# Patient Record
Sex: Male | Born: 1935
Health system: Southern US, Community
[De-identification: ages and names within clinical notes are randomized; demographics above are authoritative.]

## PROBLEM LIST (undated history)

## (undated) DIAGNOSIS — Z923 Personal history of irradiation: Secondary | ICD-10-CM

## (undated) DIAGNOSIS — L02419 Cutaneous abscess of limb, unspecified: Secondary | ICD-10-CM

## (undated) DIAGNOSIS — R002 Palpitations: Secondary | ICD-10-CM

## (undated) DIAGNOSIS — K210 Gastro-esophageal reflux disease with esophagitis: Secondary | ICD-10-CM

## (undated) DIAGNOSIS — M159 Polyosteoarthritis, unspecified: Secondary | ICD-10-CM

## (undated) DIAGNOSIS — H0230 Blepharochalasis unspecified eye, unspecified eyelid: Secondary | ICD-10-CM

## (undated) DIAGNOSIS — I440 Atrioventricular block, first degree: Secondary | ICD-10-CM

## (undated) DIAGNOSIS — C61 Malignant neoplasm of prostate: Secondary | ICD-10-CM

## (undated) DIAGNOSIS — J209 Acute bronchitis, unspecified: Secondary | ICD-10-CM

## (undated) DIAGNOSIS — E785 Hyperlipidemia, unspecified: Secondary | ICD-10-CM

## (undated) DIAGNOSIS — R079 Chest pain, unspecified: Secondary | ICD-10-CM

## (undated) DIAGNOSIS — S2249XA Multiple fractures of ribs, unspecified side, initial encounter for closed fracture: Secondary | ICD-10-CM

## (undated) DIAGNOSIS — E1165 Type 2 diabetes mellitus with hyperglycemia: Secondary | ICD-10-CM

## (undated) DIAGNOSIS — C7951 Secondary malignant neoplasm of bone: Secondary | ICD-10-CM

## (undated) DIAGNOSIS — H269 Unspecified cataract: Secondary | ICD-10-CM

## (undated) DIAGNOSIS — R310 Gross hematuria: Secondary | ICD-10-CM

## (undated) DIAGNOSIS — R7989 Other specified abnormal findings of blood chemistry: Secondary | ICD-10-CM

## (undated) DIAGNOSIS — M4800 Spinal stenosis, site unspecified: Secondary | ICD-10-CM

## (undated) DIAGNOSIS — L03119 Cellulitis of unspecified part of limb: Secondary | ICD-10-CM

## (undated) DIAGNOSIS — IMO0001 Reserved for inherently not codable concepts without codable children: Secondary | ICD-10-CM

## (undated) DIAGNOSIS — Z0000 Encounter for general adult medical examination without abnormal findings: Secondary | ICD-10-CM

## (undated) DIAGNOSIS — I10 Essential (primary) hypertension: Secondary | ICD-10-CM

## (undated) HISTORY — DX: Gross hematuria: R31.0

## (undated) HISTORY — PX: OTHER SURGICAL HISTORY: SHX169

## (undated) HISTORY — DX: Cellulitis of unspecified part of limb: L03.119

## (undated) HISTORY — DX: Hyperlipidemia, unspecified: E78.5

## (undated) HISTORY — DX: Gastro-esophageal reflux disease with esophagitis: K21.0

## (undated) HISTORY — DX: Atrioventricular block, first degree: I44.0

## (undated) HISTORY — DX: Other specified abnormal findings of blood chemistry: R79.89

## (undated) HISTORY — DX: Personal history of irradiation: Z92.3

## (undated) HISTORY — DX: Spinal stenosis, site unspecified: M48.00

## (undated) HISTORY — DX: Reserved for inherently not codable concepts without codable children: IMO0001

## (undated) HISTORY — DX: Acute bronchitis, unspecified: J20.9

## (undated) HISTORY — DX: Chest pain, unspecified: R07.9

## (undated) HISTORY — DX: Blepharochalasis unspecified eye, unspecified eyelid: H02.30

## (undated) HISTORY — DX: Palpitations: R00.2

## (undated) HISTORY — DX: Multiple fractures of ribs, unspecified side, initial encounter for closed fracture: S22.49XA

## (undated) HISTORY — DX: Cutaneous abscess of limb, unspecified: L02.419

## (undated) HISTORY — DX: Type 2 diabetes mellitus with hyperglycemia: E11.65

## (undated) HISTORY — DX: Encounter for general adult medical examination without abnormal findings: Z00.00

## (undated) HISTORY — DX: Unspecified cataract: H26.9

## (undated) HISTORY — DX: Polyosteoarthritis, unspecified: M15.9

---

## 1991-04-29 DIAGNOSIS — R7989 Other specified abnormal findings of blood chemistry: Secondary | ICD-10-CM

## 1991-04-29 HISTORY — DX: Other specified abnormal findings of blood chemistry: R79.89

## 1994-11-15 HISTORY — PX: PROSTATECTOMY: SHX69

## 1994-12-16 DIAGNOSIS — C61 Malignant neoplasm of prostate: Secondary | ICD-10-CM

## 1994-12-16 HISTORY — DX: Malignant neoplasm of prostate: C61

## 1995-04-29 DIAGNOSIS — M4800 Spinal stenosis, site unspecified: Secondary | ICD-10-CM

## 1995-04-29 HISTORY — DX: Spinal stenosis, site unspecified: M48.00

## 1998-03-20 ENCOUNTER — Ambulatory Visit (HOSPITAL_COMMUNITY): Admission: RE | Admit: 1998-03-20 | Discharge: 1998-03-20 | Payer: Self-pay | Admitting: Urology

## 1998-05-01 ENCOUNTER — Ambulatory Visit (HOSPITAL_COMMUNITY): Admission: RE | Admit: 1998-05-01 | Discharge: 1998-05-01 | Payer: Self-pay | Admitting: Urology

## 1998-09-07 ENCOUNTER — Encounter: Admission: RE | Admit: 1998-09-07 | Discharge: 1998-12-06 | Payer: Self-pay | Admitting: Radiation Oncology

## 1998-11-15 DIAGNOSIS — Z923 Personal history of irradiation: Secondary | ICD-10-CM

## 1998-11-15 HISTORY — DX: Personal history of irradiation: Z92.3

## 1998-12-08 ENCOUNTER — Encounter: Admission: RE | Admit: 1998-12-08 | Discharge: 1999-03-08 | Payer: Self-pay | Admitting: Radiation Oncology

## 2004-03-23 ENCOUNTER — Emergency Department (HOSPITAL_COMMUNITY): Admission: EM | Admit: 2004-03-23 | Discharge: 2004-03-24 | Payer: Self-pay | Admitting: Emergency Medicine

## 2004-03-24 DIAGNOSIS — S2249XA Multiple fractures of ribs, unspecified side, initial encounter for closed fracture: Secondary | ICD-10-CM

## 2004-03-24 HISTORY — DX: Multiple fractures of ribs, unspecified side, initial encounter for closed fracture: S22.49XA

## 2004-04-28 DIAGNOSIS — R002 Palpitations: Secondary | ICD-10-CM

## 2004-04-28 DIAGNOSIS — R079 Chest pain, unspecified: Secondary | ICD-10-CM

## 2004-04-28 HISTORY — DX: Chest pain, unspecified: R07.9

## 2004-04-28 HISTORY — DX: Palpitations: R00.2

## 2004-11-15 HISTORY — PX: EYE SURGERY: SHX253

## 2006-05-03 ENCOUNTER — Ambulatory Visit (HOSPITAL_COMMUNITY): Admission: RE | Admit: 2006-05-03 | Discharge: 2006-05-03 | Payer: Self-pay | Admitting: Ophthalmology

## 2006-10-28 DIAGNOSIS — H0230 Blepharochalasis unspecified eye, unspecified eyelid: Secondary | ICD-10-CM

## 2006-10-28 HISTORY — DX: Blepharochalasis unspecified eye, unspecified eyelid: H02.30

## 2007-04-25 DIAGNOSIS — I440 Atrioventricular block, first degree: Secondary | ICD-10-CM

## 2007-04-25 HISTORY — DX: Atrioventricular block, first degree: I44.0

## 2008-05-10 DIAGNOSIS — K2100 Gastro-esophageal reflux disease with esophagitis, without bleeding: Secondary | ICD-10-CM

## 2008-05-10 HISTORY — DX: Gastro-esophageal reflux disease with esophagitis, without bleeding: K21.00

## 2010-10-19 ENCOUNTER — Encounter: Admission: RE | Admit: 2010-10-19 | Discharge: 2010-10-19 | Payer: Self-pay | Admitting: Internal Medicine

## 2010-10-22 DIAGNOSIS — M159 Polyosteoarthritis, unspecified: Secondary | ICD-10-CM

## 2010-10-22 HISTORY — DX: Polyosteoarthritis, unspecified: M15.9

## 2011-09-06 DIAGNOSIS — R310 Gross hematuria: Secondary | ICD-10-CM

## 2011-09-06 HISTORY — DX: Gross hematuria: R31.0

## 2011-12-16 DIAGNOSIS — C61 Malignant neoplasm of prostate: Secondary | ICD-10-CM | POA: Diagnosis not present

## 2011-12-16 DIAGNOSIS — Z125 Encounter for screening for malignant neoplasm of prostate: Secondary | ICD-10-CM | POA: Diagnosis not present

## 2012-01-17 DIAGNOSIS — L02419 Cutaneous abscess of limb, unspecified: Secondary | ICD-10-CM

## 2012-01-17 HISTORY — DX: Cellulitis of unspecified part of limb: L02.419

## 2012-01-24 DIAGNOSIS — L03119 Cellulitis of unspecified part of limb: Secondary | ICD-10-CM | POA: Diagnosis not present

## 2012-02-17 DIAGNOSIS — I10 Essential (primary) hypertension: Secondary | ICD-10-CM | POA: Diagnosis not present

## 2012-02-17 DIAGNOSIS — M159 Polyosteoarthritis, unspecified: Secondary | ICD-10-CM | POA: Diagnosis not present

## 2012-02-17 DIAGNOSIS — E785 Hyperlipidemia, unspecified: Secondary | ICD-10-CM | POA: Diagnosis not present

## 2012-02-21 DIAGNOSIS — E785 Hyperlipidemia, unspecified: Secondary | ICD-10-CM | POA: Diagnosis not present

## 2012-02-21 DIAGNOSIS — I10 Essential (primary) hypertension: Secondary | ICD-10-CM | POA: Diagnosis not present

## 2012-06-19 DIAGNOSIS — N4889 Other specified disorders of penis: Secondary | ICD-10-CM | POA: Diagnosis not present

## 2012-06-19 DIAGNOSIS — R972 Elevated prostate specific antigen [PSA]: Secondary | ICD-10-CM | POA: Diagnosis not present

## 2012-06-19 DIAGNOSIS — C61 Malignant neoplasm of prostate: Secondary | ICD-10-CM | POA: Diagnosis not present

## 2012-06-19 DIAGNOSIS — R310 Gross hematuria: Secondary | ICD-10-CM | POA: Diagnosis not present

## 2012-06-22 ENCOUNTER — Ambulatory Visit
Admission: RE | Admit: 2012-06-22 | Discharge: 2012-06-22 | Disposition: A | Discharge status: Home / Self Care | Payer: Medicare Other | Source: Ambulatory Visit | Attending: Urology | Admitting: Urology

## 2012-06-22 ENCOUNTER — Other Ambulatory Visit: Payer: Self-pay | Admitting: Urology

## 2012-06-22 DIAGNOSIS — R319 Hematuria, unspecified: Secondary | ICD-10-CM | POA: Diagnosis not present

## 2012-06-22 DIAGNOSIS — K5730 Diverticulosis of large intestine without perforation or abscess without bleeding: Secondary | ICD-10-CM | POA: Diagnosis not present

## 2012-06-22 DIAGNOSIS — Z8546 Personal history of malignant neoplasm of prostate: Secondary | ICD-10-CM | POA: Diagnosis not present

## 2012-07-12 DIAGNOSIS — I498 Other specified cardiac arrhythmias: Secondary | ICD-10-CM | POA: Diagnosis not present

## 2012-07-12 DIAGNOSIS — I440 Atrioventricular block, first degree: Secondary | ICD-10-CM | POA: Diagnosis not present

## 2012-07-12 DIAGNOSIS — I10 Essential (primary) hypertension: Secondary | ICD-10-CM | POA: Diagnosis not present

## 2012-07-12 DIAGNOSIS — R7309 Other abnormal glucose: Secondary | ICD-10-CM | POA: Diagnosis not present

## 2012-07-18 DIAGNOSIS — R7309 Other abnormal glucose: Secondary | ICD-10-CM | POA: Diagnosis not present

## 2012-07-18 DIAGNOSIS — R319 Hematuria, unspecified: Secondary | ICD-10-CM | POA: Diagnosis not present

## 2012-07-18 DIAGNOSIS — C61 Malignant neoplasm of prostate: Secondary | ICD-10-CM | POA: Diagnosis not present

## 2012-07-18 DIAGNOSIS — D4959 Neoplasm of unspecified behavior of other genitourinary organ: Secondary | ICD-10-CM | POA: Diagnosis not present

## 2012-07-18 DIAGNOSIS — Z8546 Personal history of malignant neoplasm of prostate: Secondary | ICD-10-CM | POA: Diagnosis not present

## 2012-07-18 DIAGNOSIS — Z9079 Acquired absence of other genital organ(s): Secondary | ICD-10-CM | POA: Diagnosis not present

## 2012-07-18 DIAGNOSIS — R972 Elevated prostate specific antigen [PSA]: Secondary | ICD-10-CM | POA: Diagnosis not present

## 2012-07-18 DIAGNOSIS — I10 Essential (primary) hypertension: Secondary | ICD-10-CM | POA: Diagnosis not present

## 2012-07-18 DIAGNOSIS — R310 Gross hematuria: Secondary | ICD-10-CM | POA: Diagnosis not present

## 2012-07-21 DIAGNOSIS — R339 Retention of urine, unspecified: Secondary | ICD-10-CM | POA: Diagnosis not present

## 2012-08-03 DIAGNOSIS — Z1289 Encounter for screening for malignant neoplasm of other sites: Secondary | ICD-10-CM | POA: Diagnosis not present

## 2012-08-03 DIAGNOSIS — C61 Malignant neoplasm of prostate: Secondary | ICD-10-CM | POA: Diagnosis not present

## 2012-08-03 DIAGNOSIS — M899 Disorder of bone, unspecified: Secondary | ICD-10-CM | POA: Diagnosis not present

## 2012-08-08 DIAGNOSIS — N4889 Other specified disorders of penis: Secondary | ICD-10-CM | POA: Diagnosis not present

## 2012-08-08 DIAGNOSIS — C61 Malignant neoplasm of prostate: Secondary | ICD-10-CM | POA: Diagnosis not present

## 2012-08-16 DIAGNOSIS — E785 Hyperlipidemia, unspecified: Secondary | ICD-10-CM | POA: Diagnosis not present

## 2012-08-16 DIAGNOSIS — I10 Essential (primary) hypertension: Secondary | ICD-10-CM | POA: Diagnosis not present

## 2012-08-16 DIAGNOSIS — R7989 Other specified abnormal findings of blood chemistry: Secondary | ICD-10-CM | POA: Diagnosis not present

## 2012-08-21 DIAGNOSIS — M316 Other giant cell arteritis: Secondary | ICD-10-CM | POA: Diagnosis not present

## 2012-08-21 DIAGNOSIS — C7982 Secondary malignant neoplasm of genital organs: Secondary | ICD-10-CM | POA: Diagnosis not present

## 2012-08-21 DIAGNOSIS — I10 Essential (primary) hypertension: Secondary | ICD-10-CM | POA: Diagnosis not present

## 2012-08-22 DIAGNOSIS — Z23 Encounter for immunization: Secondary | ICD-10-CM | POA: Diagnosis not present

## 2012-09-12 DIAGNOSIS — J209 Acute bronchitis, unspecified: Secondary | ICD-10-CM

## 2012-09-12 HISTORY — DX: Acute bronchitis, unspecified: J20.9

## 2012-09-18 DIAGNOSIS — N393 Stress incontinence (female) (male): Secondary | ICD-10-CM | POA: Diagnosis not present

## 2012-09-18 DIAGNOSIS — C61 Malignant neoplasm of prostate: Secondary | ICD-10-CM | POA: Diagnosis not present

## 2012-09-25 ENCOUNTER — Ambulatory Visit
Admission: RE | Admit: 2012-09-25 | Discharge: 2012-09-25 | Disposition: A | Discharge status: Home / Self Care | Payer: Medicare Other | Source: Ambulatory Visit | Attending: Internal Medicine | Admitting: Internal Medicine

## 2012-09-25 ENCOUNTER — Other Ambulatory Visit (HOSPITAL_BASED_OUTPATIENT_CLINIC_OR_DEPARTMENT_OTHER): Payer: Self-pay | Admitting: Internal Medicine

## 2012-09-25 DIAGNOSIS — R05 Cough: Secondary | ICD-10-CM

## 2012-09-25 DIAGNOSIS — J209 Acute bronchitis, unspecified: Secondary | ICD-10-CM | POA: Diagnosis not present

## 2012-09-25 DIAGNOSIS — I10 Essential (primary) hypertension: Secondary | ICD-10-CM | POA: Diagnosis not present

## 2012-09-25 DIAGNOSIS — R0602 Shortness of breath: Secondary | ICD-10-CM | POA: Diagnosis not present

## 2012-10-03 DIAGNOSIS — Z9079 Acquired absence of other genital organ(s): Secondary | ICD-10-CM | POA: Diagnosis not present

## 2012-10-03 DIAGNOSIS — N393 Stress incontinence (female) (male): Secondary | ICD-10-CM | POA: Diagnosis not present

## 2012-10-03 DIAGNOSIS — Z79899 Other long term (current) drug therapy: Secondary | ICD-10-CM | POA: Diagnosis not present

## 2012-10-03 DIAGNOSIS — C61 Malignant neoplasm of prostate: Secondary | ICD-10-CM | POA: Diagnosis not present

## 2012-10-03 DIAGNOSIS — I10 Essential (primary) hypertension: Secondary | ICD-10-CM | POA: Diagnosis not present

## 2012-10-03 DIAGNOSIS — Z923 Personal history of irradiation: Secondary | ICD-10-CM | POA: Diagnosis not present

## 2012-11-06 DIAGNOSIS — C61 Malignant neoplasm of prostate: Secondary | ICD-10-CM | POA: Diagnosis not present

## 2012-11-06 DIAGNOSIS — R310 Gross hematuria: Secondary | ICD-10-CM | POA: Diagnosis not present

## 2012-11-28 DIAGNOSIS — C61 Malignant neoplasm of prostate: Secondary | ICD-10-CM | POA: Diagnosis not present

## 2012-11-28 DIAGNOSIS — R319 Hematuria, unspecified: Secondary | ICD-10-CM | POA: Diagnosis not present

## 2012-11-28 DIAGNOSIS — R7309 Other abnormal glucose: Secondary | ICD-10-CM | POA: Diagnosis not present

## 2012-11-28 DIAGNOSIS — I10 Essential (primary) hypertension: Secondary | ICD-10-CM | POA: Diagnosis not present

## 2012-12-01 DIAGNOSIS — Z8546 Personal history of malignant neoplasm of prostate: Secondary | ICD-10-CM | POA: Diagnosis not present

## 2012-12-01 DIAGNOSIS — C7982 Secondary malignant neoplasm of genital organs: Secondary | ICD-10-CM | POA: Diagnosis not present

## 2012-12-01 DIAGNOSIS — R319 Hematuria, unspecified: Secondary | ICD-10-CM | POA: Diagnosis not present

## 2012-12-01 DIAGNOSIS — I10 Essential (primary) hypertension: Secondary | ICD-10-CM | POA: Diagnosis not present

## 2012-12-01 DIAGNOSIS — R7309 Other abnormal glucose: Secondary | ICD-10-CM | POA: Diagnosis not present

## 2012-12-01 DIAGNOSIS — E669 Obesity, unspecified: Secondary | ICD-10-CM | POA: Diagnosis not present

## 2012-12-01 DIAGNOSIS — K219 Gastro-esophageal reflux disease without esophagitis: Secondary | ICD-10-CM | POA: Diagnosis not present

## 2012-12-01 DIAGNOSIS — N3289 Other specified disorders of bladder: Secondary | ICD-10-CM | POA: Diagnosis not present

## 2012-12-01 DIAGNOSIS — R972 Elevated prostate specific antigen [PSA]: Secondary | ICD-10-CM | POA: Diagnosis not present

## 2012-12-01 DIAGNOSIS — C61 Malignant neoplasm of prostate: Secondary | ICD-10-CM | POA: Diagnosis not present

## 2012-12-01 DIAGNOSIS — N393 Stress incontinence (female) (male): Secondary | ICD-10-CM | POA: Diagnosis not present

## 2012-12-01 DIAGNOSIS — R310 Gross hematuria: Secondary | ICD-10-CM | POA: Diagnosis not present

## 2012-12-01 DIAGNOSIS — Z6837 Body mass index (BMI) 37.0-37.9, adult: Secondary | ICD-10-CM | POA: Diagnosis not present

## 2012-12-01 DIAGNOSIS — Z9889 Other specified postprocedural states: Secondary | ICD-10-CM | POA: Diagnosis not present

## 2012-12-01 DIAGNOSIS — N35919 Unspecified urethral stricture, male, unspecified site: Secondary | ICD-10-CM | POA: Diagnosis not present

## 2012-12-05 DIAGNOSIS — C61 Malignant neoplasm of prostate: Secondary | ICD-10-CM | POA: Diagnosis not present

## 2012-12-05 DIAGNOSIS — R972 Elevated prostate specific antigen [PSA]: Secondary | ICD-10-CM | POA: Diagnosis not present

## 2012-12-09 ENCOUNTER — Inpatient Hospital Stay (HOSPITAL_COMMUNITY)
Admission: EM | Admit: 2012-12-09 | Discharge: 2012-12-12 | DRG: 872 | Disposition: A | Discharge status: Home / Self Care | Payer: Medicare Other | Attending: Family Medicine | Admitting: Family Medicine

## 2012-12-09 DIAGNOSIS — Z9079 Acquired absence of other genital organ(s): Secondary | ICD-10-CM

## 2012-12-09 DIAGNOSIS — A4159 Other Gram-negative sepsis: Principal | ICD-10-CM | POA: Diagnosis present

## 2012-12-09 DIAGNOSIS — A419 Sepsis, unspecified organism: Secondary | ICD-10-CM | POA: Diagnosis present

## 2012-12-09 DIAGNOSIS — N10 Acute tubulo-interstitial nephritis: Secondary | ICD-10-CM | POA: Diagnosis not present

## 2012-12-09 DIAGNOSIS — R197 Diarrhea, unspecified: Secondary | ICD-10-CM | POA: Diagnosis not present

## 2012-12-09 DIAGNOSIS — N390 Urinary tract infection, site not specified: Secondary | ICD-10-CM | POA: Diagnosis not present

## 2012-12-09 DIAGNOSIS — N12 Tubulo-interstitial nephritis, not specified as acute or chronic: Secondary | ICD-10-CM | POA: Diagnosis present

## 2012-12-09 DIAGNOSIS — I10 Essential (primary) hypertension: Secondary | ICD-10-CM | POA: Diagnosis present

## 2012-12-09 DIAGNOSIS — Z923 Personal history of irradiation: Secondary | ICD-10-CM

## 2012-12-09 DIAGNOSIS — R112 Nausea with vomiting, unspecified: Secondary | ICD-10-CM | POA: Diagnosis not present

## 2012-12-09 DIAGNOSIS — R404 Transient alteration of awareness: Secondary | ICD-10-CM | POA: Diagnosis not present

## 2012-12-09 DIAGNOSIS — D72829 Elevated white blood cell count, unspecified: Secondary | ICD-10-CM | POA: Diagnosis present

## 2012-12-09 DIAGNOSIS — R0989 Other specified symptoms and signs involving the circulatory and respiratory systems: Secondary | ICD-10-CM | POA: Diagnosis not present

## 2012-12-09 DIAGNOSIS — R5381 Other malaise: Secondary | ICD-10-CM | POA: Diagnosis not present

## 2012-12-09 DIAGNOSIS — Z8546 Personal history of malignant neoplasm of prostate: Secondary | ICD-10-CM

## 2012-12-09 DIAGNOSIS — Z79899 Other long term (current) drug therapy: Secondary | ICD-10-CM

## 2012-12-09 DIAGNOSIS — E871 Hypo-osmolality and hyponatremia: Secondary | ICD-10-CM | POA: Diagnosis present

## 2012-12-09 DIAGNOSIS — E876 Hypokalemia: Secondary | ICD-10-CM | POA: Diagnosis present

## 2012-12-09 HISTORY — DX: Malignant neoplasm of prostate: C61

## 2012-12-09 HISTORY — DX: Essential (primary) hypertension: I10

## 2012-12-09 MED ORDER — SODIUM CHLORIDE 0.9 % IV SOLN
INTRAVENOUS | Status: DC
  Administered 2012-12-10: 01:00:00 via INTRAVENOUS

## 2012-12-09 MED ORDER — ACETAMINOPHEN 325 MG PO TABS
650.0000 mg | ORAL_TABLET | Freq: Once | ORAL | Status: AC
  Administered 2012-12-10: 650 mg via ORAL
  Filled 2012-12-09: qty 1

## 2012-12-09 MED ORDER — ONDANSETRON HCL 4 MG/2ML IJ SOLN
4.0000 mg | Freq: Once | INTRAMUSCULAR | Status: AC
  Administered 2012-12-10: 4 mg via INTRAVENOUS
  Filled 2012-12-09: qty 2

## 2012-12-09 NOTE — ED Notes (Signed)
Bed:WA25<BR> Expected date:<BR> Expected time:<BR> Means of arrival:<BR> Comments:<BR> EMS

## 2012-12-09 NOTE — ED Provider Notes (Signed)
History     CSN: [id number]  Arrival date & time 12/09/12  2321   First MD Initiated Contact with Patient 12/09/12 2337      Chief Complaint  Patient presents with  . Nausea  . Emesis  . Weakness  . Diarrhea    (Consider location/radiation/quality/duration/timing/severity/associated sxs/prior treatment) HPI Hx per PT - generalized weakness, chills and fever today, started feeling poor;y yesterday with N/V/D, no blood in emesis or stools. PT has remote h/p prostate cancer, self caths and last week had a ureteral stent placed by Urology at [facility]. No ABD pain. Has had some LBP denies any today. No dysuria or sig hematuria. Today worsening symptoms, Moderate in severity.   No past medical history on file.  No past surgical history on file.  No family history on file.  History  Substance Use Topics  . Smoking status: Not on file  . Smokeless tobacco: Not on file  . Alcohol Use: Not on file      Review of Systems  Constitutional: Positive for fever and chills.  HENT: Negative for neck pain and neck stiffness.   Eyes: Negative for pain.  Respiratory: Negative for shortness of breath.   Cardiovascular: Negative for chest pain.  Gastrointestinal: Positive for nausea, vomiting and diarrhea. Negative for abdominal pain.  Genitourinary: Negative for dysuria and flank pain.  Musculoskeletal: Negative for back pain.  Skin: Negative for rash.  Neurological: Negative for headaches.  All other systems reviewed and are negative.    Allergies  Review of patient's allergies indicates no known allergies.  Home Medications   Current Outpatient Rx  Name  Route  Sig  Dispense  Refill  . ACETAMINOPHEN 500 MG PO TABS   Oral   Take 1,000 mg by mouth every 6 (six) hours as needed. For pain         . DOXAZOSIN MESYLATE 4 MG PO TABS   Oral   Take 4 mg by mouth every morning.         Marland Kitchen HYDROCHLOROTHIAZIDE 25 MG PO TABS   Oral   Take 25 mg by mouth every morning.        Marland Kitchen METOPROLOL TARTRATE 50 MG PO TABS   Oral   Take 50 mg by mouth every morning.         Marland Kitchen POLYETHYLENE GLYCOL 3350 PO POWD   Oral   Take 17 g by mouth daily.         . TRANDOLAPRIL-VERAPAMIL HCL ER 4-240 MG PO TBCR   Oral   Take 1 tablet by mouth every morning.           BP 143/51  Pulse 87  Temp 100.4 F (38 C) (Oral)  Resp 16  SpO2 96%  Physical Exam  Constitutional: He is oriented to person, place, and time. He appears well-developed and well-nourished.  HENT:  Head: Normocephalic and atraumatic.       Dry mm  Eyes: Conjunctivae normal and EOM are normal. Pupils are equal, round, and reactive to light.  Neck: Neck supple.  Cardiovascular: Normal rate, regular rhythm and intact distal pulses.   Pulmonary/Chest: Effort normal and breath sounds normal. No respiratory distress.  Abdominal: Soft. Bowel sounds are normal. He exhibits no distension and no mass. There is no tenderness. There is no rebound and no guarding.  Musculoskeletal: Normal range of motion. He exhibits no edema.  Neurological: He is alert and oriented to person, place, and time.  Skin: Skin is warm and  dry.    ED Course  Procedures (including critical care time)  Results for orders placed during the hospital encounter of 12/09/12  CBC WITH DIFFERENTIAL      Component Value Range   WBC 21.2 (*) 4.0 - 10.5 K/uL   RBC 4.28  4.22 - 5.81 MIL/uL   Hemoglobin 12.4 (*) 13.0 - 17.0 g/dL   HCT 16.1 (*) 09.6 - 04.5 %   MCV 83.2  78.0 - 100.0 fL   MCH 29.0  26.0 - 34.0 pg   MCHC 34.8  30.0 - 36.0 g/dL   RDW 40.9  81.1 - 91.4 %   Platelets 169  150 - 400 K/uL   Neutrophils Relative 89 (*) 43 - 77 %   Neutro Abs 18.9 (*) 1.7 - 7.7 K/uL   Lymphocytes Relative 3 (*) 12 - 46 %   Lymphs Abs 0.7  0.7 - 4.0 K/uL   Monocytes Relative 8  3 - 12 %   Monocytes Absolute 1.6 (*) 0.1 - 1.0 K/uL   Eosinophils Relative 0  0 - 5 %   Eosinophils Absolute 0.0  0.0 - 0.7 K/uL   Basophils Relative 0  0 - 1 %     Basophils Absolute 0.0  0.0 - 0.1 K/uL  URINALYSIS, ROUTINE W REFLEX MICROSCOPIC      Component Value Range   Color, Urine AMBER (*) YELLOW   APPearance TURBID (*) CLEAR   Specific Gravity, Urine 1.026  1.005 - 1.030   pH 5.5  5.0 - 8.0   Glucose, UA NEGATIVE  NEGATIVE mg/dL   Hgb urine dipstick LARGE (*) NEGATIVE   Bilirubin Urine SMALL (*) NEGATIVE   Ketones, ur TRACE (*) NEGATIVE mg/dL   Protein, ur 782 (*) NEGATIVE mg/dL   Urobilinogen, UA 1.0  0.0 - 1.0 mg/dL   Nitrite POSITIVE (*) NEGATIVE   Leukocytes, UA LARGE (*) NEGATIVE  LIPASE, BLOOD      Component Value Range   Lipase 13  11 - 59 U/L  COMPREHENSIVE METABOLIC PANEL      Component Value Range   Sodium 130 (*) 135 - 145 mEq/L   Potassium 3.3 (*) 3.5 - 5.1 mEq/L   Chloride 96  96 - 112 mEq/L   CO2 24  19 - 32 mEq/L   Glucose, Bld 200 (*) 70 - 99 mg/dL   BUN 33 (*) 6 - 23 mg/dL   Creatinine, Ser 9.56 (*) 0.50 - 1.35 mg/dL   Calcium 7.9 (*) 8.4 - 10.5 mg/dL   Total Protein 6.0  6.0 - 8.3 g/dL   Albumin 2.7 (*) 3.5 - 5.2 g/dL   AST 13  0 - 37 U/L   ALT 11  0 - 53 U/L   Alkaline Phosphatase 69  39 - 117 U/L   Total Bilirubin 0.6  0.3 - 1.2 mg/dL   GFR calc non Af Amer 40 (*) >90 mL/min   GFR calc Af Amer 46 (*) >90 mL/min  URINE MICROSCOPIC-ADD ON      Component Value Range   Squamous Epithelial / LPF RARE  RARE   WBC, UA TOO NUMEROUS TO COUNT  <3 WBC/hpf   RBC / HPF TOO NUMEROUS TO COUNT  <3 RBC/hpf   Bacteria, UA MANY (*) RARE   Urine-Other AMORPHOUS URATES/PHOSPHATES    POCT I-STAT, CHEM 8      Component Value Range   Sodium 135  135 - 145 mEq/L   Potassium 3.3 (*) 3.5 - 5.1 mEq/L  Chloride 100  96 - 112 mEq/L   BUN 32 (*) 6 - 23 mg/dL   Creatinine, Ser 9.81 (*) 0.50 - 1.35 mg/dL   Glucose, Bld 191 (*) 70 - 99 mg/dL   Calcium, Ion 4.78 (*) 1.13 - 1.30 mmol/L   TCO2 23  0 - 100 mmol/L   Hemoglobin 11.6 (*) 13.0 - 17.0 g/dL   HCT 29.5 (*) 62.1 - 30.8 %   Dg Chest Portable 1 View  12/10/2012   *RADIOLOGY REPORT*  Clinical Data: Fever, vomiting and weakness.  PORTABLE CHEST - 1 VIEW  Comparison: Chest radiograph performed 09/25/2012  Findings: The lungs are well expanded.  Mildly more prominent left basilar opacity likely reflects atelectasis, though pneumonia might have a similar appearance.  Mild vascular congestion is seen. Underlying chronic peribronchial thickening is noted.  No pleural effusion or pneumothorax is identified.  The cardiomediastinal silhouette is mildly enlarged.  No acute osseous abnormalities are identified.  There is minimal chronic irregularity involving several right-sided ribs.  IMPRESSION:  1.  Mildly more prominent left basilar airspace opacity likely reflects atelectasis, though pneumonia might have a similar appearance.  Underlying chronic peribronchial thickening noted. 2.  Mild cardiomegaly and mild vascular congestion noted.   Original Report Authenticated By: Tonia Ghent, M.D.    IVfs, IV zofran, PT denies any pain  1:37 AM Urology consult. DR Dhalstedt recs CT stone study now and if has hydro will evaluate at this time, if no hydro will see in am. MED consult - Dr Beacher May to admit, CT scan pending.  MDM   Fever, UTI, Leukocytosis, recent Urology procedure - stent placement. IV ABx. medical admission.  Sunnie Nielsen, MD 12/12/12 [phone]

## 2012-12-09 NOTE — ED Notes (Signed)
Per EMS, pt.is from home who complained of nausea with vomiting , also reported of diarrhea for 3 days. Pt. Stated that he is feeling weak as well. Alert and oriented, denies chest pain or SOB.

## 2012-12-10 ENCOUNTER — Emergency Department (HOSPITAL_COMMUNITY): Payer: Medicare Other

## 2012-12-10 ENCOUNTER — Encounter (HOSPITAL_COMMUNITY): Payer: Self-pay | Admitting: Internal Medicine

## 2012-12-10 DIAGNOSIS — N12 Tubulo-interstitial nephritis, not specified as acute or chronic: Secondary | ICD-10-CM | POA: Diagnosis not present

## 2012-12-10 DIAGNOSIS — I10 Essential (primary) hypertension: Secondary | ICD-10-CM | POA: Diagnosis present

## 2012-12-10 DIAGNOSIS — R0989 Other specified symptoms and signs involving the circulatory and respiratory systems: Secondary | ICD-10-CM | POA: Diagnosis not present

## 2012-12-10 DIAGNOSIS — Z923 Personal history of irradiation: Secondary | ICD-10-CM | POA: Diagnosis not present

## 2012-12-10 DIAGNOSIS — D72829 Elevated white blood cell count, unspecified: Secondary | ICD-10-CM | POA: Diagnosis present

## 2012-12-10 DIAGNOSIS — N390 Urinary tract infection, site not specified: Secondary | ICD-10-CM | POA: Diagnosis not present

## 2012-12-10 DIAGNOSIS — Z79899 Other long term (current) drug therapy: Secondary | ICD-10-CM | POA: Diagnosis not present

## 2012-12-10 DIAGNOSIS — Z9079 Acquired absence of other genital organ(s): Secondary | ICD-10-CM | POA: Diagnosis not present

## 2012-12-10 DIAGNOSIS — E876 Hypokalemia: Secondary | ICD-10-CM | POA: Diagnosis not present

## 2012-12-10 DIAGNOSIS — A419 Sepsis, unspecified organism: Secondary | ICD-10-CM | POA: Diagnosis present

## 2012-12-10 DIAGNOSIS — Z8546 Personal history of malignant neoplasm of prostate: Secondary | ICD-10-CM | POA: Diagnosis not present

## 2012-12-10 DIAGNOSIS — E871 Hypo-osmolality and hyponatremia: Secondary | ICD-10-CM | POA: Diagnosis not present

## 2012-12-10 DIAGNOSIS — N10 Acute tubulo-interstitial nephritis: Secondary | ICD-10-CM | POA: Diagnosis not present

## 2012-12-10 DIAGNOSIS — I700 Atherosclerosis of aorta: Secondary | ICD-10-CM | POA: Diagnosis not present

## 2012-12-10 DIAGNOSIS — K5730 Diverticulosis of large intestine without perforation or abscess without bleeding: Secondary | ICD-10-CM | POA: Diagnosis not present

## 2012-12-10 DIAGNOSIS — A4159 Other Gram-negative sepsis: Secondary | ICD-10-CM | POA: Diagnosis present

## 2012-12-10 LAB — BASIC METABOLIC PANEL
BUN: 31 mg/dL — ABNORMAL HIGH (ref 6–23)
CO2: 24 mEq/L (ref 19–32)
Chloride: 101 mEq/L (ref 96–112)
GFR calc Af Amer: 55 mL/min — ABNORMAL LOW (ref >90)
Glucose, Bld: 154 mg/dL — ABNORMAL HIGH (ref 70–99)
Potassium: 3.4 mEq/L — ABNORMAL LOW (ref 3.5–5.1)

## 2012-12-10 LAB — CBC
HCT: 33.8 % — ABNORMAL LOW (ref 39.0–52.0)
Hemoglobin: 11.5 g/dL — ABNORMAL LOW (ref 13.0–17.0)
MCV: 83.3 fL (ref 78.0–100.0)
RBC: 4.06 MIL/uL — ABNORMAL LOW (ref 4.22–5.81)
RDW: 13.7 % (ref 11.5–15.5)
WBC: 18.9 10*3/uL — ABNORMAL HIGH (ref 4.0–10.5)

## 2012-12-10 LAB — CBC WITH DIFFERENTIAL/PLATELET
Basophils Relative: 0 % (ref 0–1)
HCT: 35.6 % — ABNORMAL LOW (ref 39.0–52.0)
Lymphocytes Relative: 3 % — ABNORMAL LOW (ref 12–46)
Lymphs Abs: 0.7 10*3/uL (ref 0.7–4.0)
MCV: 83.2 fL (ref 78.0–100.0)
Monocytes Absolute: 1.6 10*3/uL — ABNORMAL HIGH (ref 0.1–1.0)
Monocytes Relative: 8 % (ref 3–12)
Neutro Abs: 18.9 10*3/uL — ABNORMAL HIGH (ref 1.7–7.7)
Neutrophils Relative %: 89 % — ABNORMAL HIGH (ref 43–77)
Platelets: 169 10*3/uL (ref 150–400)

## 2012-12-10 LAB — POCT I-STAT, CHEM 8
Glucose, Bld: 196 mg/dL — ABNORMAL HIGH (ref 70–99)
HCT: 34 % — ABNORMAL LOW (ref 39.0–52.0)
Hemoglobin: 11.6 g/dL — ABNORMAL LOW (ref 13.0–17.0)
Potassium: 3.3 mEq/L — ABNORMAL LOW (ref 3.5–5.1)
Sodium: 135 mEq/L (ref 135–145)

## 2012-12-10 LAB — COMPREHENSIVE METABOLIC PANEL
ALT: 11 U/L (ref 0–53)
BUN: 33 mg/dL — ABNORMAL HIGH (ref 6–23)
CO2: 24 mEq/L (ref 19–32)
Calcium: 7.9 mg/dL — ABNORMAL LOW (ref 8.4–10.5)
Chloride: 96 mEq/L (ref 96–112)
GFR calc non Af Amer: 40 mL/min — ABNORMAL LOW (ref >90)
Potassium: 3.3 mEq/L — ABNORMAL LOW (ref 3.5–5.1)
Sodium: 130 mEq/L — ABNORMAL LOW (ref 135–145)

## 2012-12-10 LAB — URINE MICROSCOPIC-ADD ON

## 2012-12-10 LAB — URINALYSIS, ROUTINE W REFLEX MICROSCOPIC
Nitrite: POSITIVE — AB
Urobilinogen, UA: 1 mg/dL (ref 0.0–1.0)

## 2012-12-10 LAB — GLUCOSE, CAPILLARY
Glucose-Capillary: 160 mg/dL — ABNORMAL HIGH (ref 70–99)
Glucose-Capillary: 114 mg/dL — ABNORMAL HIGH (ref 70–99)

## 2012-12-10 LAB — SURGICAL PCR SCREEN: MRSA, PCR: NEGATIVE

## 2012-12-10 LAB — LIPASE, BLOOD: Lipase: 13 U/L (ref 11–59)

## 2012-12-10 MED ORDER — HEPARIN SODIUM (PORCINE) 5000 UNIT/ML IJ SOLN
5000.0000 [IU] | Freq: Three times a day (TID) | INTRAMUSCULAR | Status: DC
  Administered 2012-12-10 – 2012-12-12 (×8): 5000 [IU] via SUBCUTANEOUS
  Filled 2012-12-10 (×10): qty 1

## 2012-12-10 MED ORDER — POLYETHYLENE GLYCOL 3350 17 G PO PACK
17.0000 g | PACK | Freq: Every day | ORAL | Status: DC
  Filled 2012-12-10 (×3): qty 1

## 2012-12-10 MED ORDER — DEXTROSE 5 % IV SOLN
1.0000 g | INTRAVENOUS | Status: DC
  Administered 2012-12-11 – 2012-12-12 (×2): 1 g via INTRAVENOUS
  Filled 2012-12-10 (×2): qty 10

## 2012-12-10 MED ORDER — MUPIROCIN 2 % EX OINT
1.0000 "application " | TOPICAL_OINTMENT | Freq: Two times a day (BID) | CUTANEOUS | Status: DC
  Administered 2012-12-10 – 2012-12-12 (×4): 1 via NASAL
  Filled 2012-12-10: qty 22

## 2012-12-10 MED ORDER — ACETAMINOPHEN 500 MG PO TABS: 1000.0000 mg | ORAL_TABLET | Freq: Four times a day (QID) | ORAL | Status: DC | PRN

## 2012-12-10 MED ORDER — TRANDOLAPRIL-VERAPAMIL HCL ER 4-240 MG PO TBCR
1.0000 | EXTENDED_RELEASE_TABLET | Freq: Every morning | ORAL | Status: DC
Start: 2012-12-10 — End: 2012-12-10

## 2012-12-10 MED ORDER — POLYETHYLENE GLYCOL 3350 17 GM/SCOOP PO POWD
17.0000 g | Freq: Every day | ORAL | Status: DC
  Filled 2012-12-10: qty 255

## 2012-12-10 MED ORDER — ONDANSETRON HCL 4 MG/2ML IJ SOLN: 4.0000 mg | Freq: Four times a day (QID) | INTRAMUSCULAR | Status: DC | PRN

## 2012-12-10 MED ORDER — METOPROLOL TARTRATE 25 MG PO TABS: 50.0000 mg | ORAL_TABLET | Freq: Every morning | ORAL | Status: DC

## 2012-12-10 MED ORDER — DOXAZOSIN MESYLATE 4 MG PO TABS
4.0000 mg | ORAL_TABLET | Freq: Every morning | ORAL | Status: DC
  Filled 2012-12-10: qty 1

## 2012-12-10 MED ORDER — VERAPAMIL HCL ER 240 MG PO TBCR
240.0000 mg | EXTENDED_RELEASE_TABLET | Freq: Every day | ORAL | Status: DC
  Filled 2012-12-10: qty 1

## 2012-12-10 MED ORDER — CHLORHEXIDINE GLUCONATE CLOTH 2 % EX PADS
6.0000 | MEDICATED_PAD | Freq: Every day | CUTANEOUS | Status: DC
  Administered 2012-12-10 – 2012-12-12 (×3): 6 via TOPICAL

## 2012-12-10 MED ORDER — DEXTROSE 5 % IV SOLN
1.0000 g | Freq: Once | INTRAVENOUS | Status: DC
  Filled 2012-12-10 (×2): qty 10

## 2012-12-10 MED ORDER — INSULIN ASPART 100 UNIT/ML ~~LOC~~ SOLN
0.0000 [IU] | Freq: Three times a day (TID) | SUBCUTANEOUS | Status: DC
  Administered 2012-12-11: 1 [IU] via SUBCUTANEOUS

## 2012-12-10 MED ORDER — TRANDOLAPRIL 4 MG PO TABS
4.0000 mg | ORAL_TABLET | Freq: Every day | ORAL | Status: DC
  Filled 2012-12-10: qty 1

## 2012-12-10 NOTE — Progress Notes (Signed)
Patient seen and admitted earlier this Am by my associate.  Please refer to his H and P for further details regarding Assessment and Plan.  Urology on board and is currently making recommendations  Jeremiah Collins, Pamala Hurry

## 2012-12-10 NOTE — ED Notes (Signed)
Per pt family, pt had stent placed and has to intermittently in & out cath himself.  He has h/o prostate cancer. Has c/o abdominal pain and nausea.

## 2012-12-10 NOTE — ED Notes (Signed)
Patient transported to CT 

## 2012-12-10 NOTE — Consult Note (Signed)
Urology Consult Requesting physician: Sunnie Nielsen, M.D.  CC: Kidney infection  HPI: 79 year old male is admitted to the medical service for pyelonephritis. He has a long-standing history of adenocarcinoma of the prostate, and is status post radical retropubic prostatectomy in 1996, followed by salvage radiation in 1999. He has been followed by Dr. Karleen Hampshire since that time, and transferred his care to [facility] few years ago. He has a PSA trending upwards, indicative of biochemical persistence/recurrence. His most recent PSA by his history was 3.2. He states that he has had normal bone scan and CT scans recently.  Apparently, he has had recurrent gross hematuria. He had cystoscopy, possible ureteroscopy and a left double-J stent placement approximately one week ago, on 12/01/2012. Apparently, he was sent home that day. He does not recall being put on an antibiotic. He has had recurrent hematuria since that time. Recently, he began feeling weak, fatigued and had fever and chills. He came to the emergency room yesterday and was found to have pyelonephritis. I was contacted, and requested a CT scan, as he did have a history of the stent. There was no evidence of hydronephrosis associated with the left renal unit, where the stent was placed. He had leukocytosis and infected urine, and is admitted for medical therapy.   PMH: Past Medical History  Diagnosis Date  . Prostate cancer   . HTN (hypertension)     PSH: Past Surgical History  Procedure Date  . Left ureteral stent placement Week of 12/05/11    Allergies: No Known Allergies  Medications: Prescriptions prior to admission  Medication Sig Dispense Refill  . acetaminophen (TYLENOL) 500 MG tablet Take 1,000 mg by mouth every 6 (six) hours as needed. For pain      . doxazosin (CARDURA) 4 MG tablet Take 4 mg by mouth every morning.      . hydrochlorothiazide (HYDRODIURIL) 25 MG tablet Take 25 mg by  mouth every morning.      . metoprolol (LOPRESSOR) 50 MG tablet Take 50 mg by mouth every morning.      . polyethylene glycol powder (GLYCOLAX/MIRALAX) powder Take 17 g by mouth daily.      . trandolapril-verapamil (TARKA) 4-240 MG per tablet Take 1 tablet by mouth every morning.         Social History: History   Social History  . Marital Status: Married    Spouse Name: N/A    Number of Children: N/A  . Years of Education: N/A   Occupational History  . Not on file.   Social History Main Topics  . Smoking status: Former Smoker    Types: Cigars    Quit date: 12/16/1968  . Smokeless tobacco: Not on file  . Alcohol Use: No  . Drug Use: No  . Sexually Active:    Other Topics Concern  . Not on file   Social History Narrative  . No narrative on file    Family History: Family History  Problem Relation Age of Onset  . Family history unknown: Yes    Review of Systems: Positive: Foul-smelling, thick urine, dysuria, hematuria, fever, chills, weakness  Negative:  A further 10 point review of systems was negative except what is listed in the HPI.  Physical Exam: @VITALS2 @ General: No acute distress.  Awake. he enters questions appropriately  Head:  Normocephalic.  Atraumatic. ENT:  EOMI.  Mucous membranes moist Neck:  Supple.  No lymphadenopathy. CV:  S1 present. S2 present. Regular  rate. Pulmonary: Equal effort bilaterally.  Clear to auscultation bilaterally. Abdomen: Soft.Slightly obese,   tnon-ender to palpation. Skin:  Normal turgor.  No visible rash. Extremity: No gross deformity of bilateral upper extremities.  No gross deformity of    bilateral lower extremities. Neurologic: Alert. Appropriate mood.  Penis:  Uncircumcised.  No lesions. Urethra: Foley catheter in place.  Orthotopic meatus. Scrotum: No lesions.  No ecchymosis.  No erythema. Testicles: Descended bilaterally.  No masses bilaterally. Epididymis: Palpable bilaterally.  Non Tender to  palpation.  Studies:  Recent Labs  [facility] 12/10/12 0516 12/10/12 0101 12/10/12 0050   HGB 11.5* 11.6* --   WBC 18.9* -- 21.2*   PLT 172 -- 169    Recent Labs  Basename 12/10/12 0516 12/10/12 0101 12/10/12 0050   NA 134* 135 --   K 3.4* 3.3* --   CL 101 100 --   CO2 24 -- 24   BUN 31* 32* --   CREATININE 1.39* 1.60* --   CALCIUM 8.1* -- 7.9*   GFRNONAA 48* -- 40*   GFRAA 55* -- 46*     No results found for this basename: PT:2,INR:2,APTT:2 in the last 72 hours   No components found with this basename: ABG:2  I reviewed the patient's CT scan. This shows perinephric stranding the left, with some thickening around the ureter. The stent seems adequately placed. I do not see any evidence of hydronephrosis.  Assessment:  1. Pyelonephritis. He had a recent endoscopic intervention of at least his bladder. I do not know the specific procedure that he had done, but he was left with a stent. Stent seems to be draining effectively/appropriately, without evidence of hydronephrosis on the left.  2. Adenocarcinoma of the prostate, status post radical retropubic prostatectomy in 1996, with salvage radiotherapy in 1999. PSA is 3.2, and has been on an upward trend over the past few years. He is not on any adjuvant therapy at the present time. He is asymptomatic and by report has had a negative bone scan. CT scan reveals no evidence of osseous metastatic disease.  Plan: 1. I discussed the situation with the patient-his stent is draining appropriately. I think he just needs an adequate course of antibiotic therapy. He is on Rocephin which is appropriate at the present time  2. Unfortunately, his urologist is not on the staff here-hopefully, the patient will be able to followup with him once he has been discharged.  3. I do not think he needs any urologic intervention i.e. stent replacement at the present time.  4. I will continue to follow him.    Pager:[phone]

## 2012-12-10 NOTE — H&P (Addendum)
[facility] History and Physical  Jeremiah Collins ZOX:[id number] DOB: [birth year] DOA: 12/09/2012  Referring physician: ED PCP: Provider Not In System  Specialists: None  Chief Complaint: Nausea, vomiting, weakness, diarrhea  HPI: Jeremiah Collins is a 79 y.o. male who presents with c/o N/V/D, weakness, fever, chills.  N/V/D onset yesterday followed by fever and chills today.  Remote h/o prostate cancer, self caths, also had a ureteral stent placed by urology at [facility] last week.  His moderately severe symptoms have been worsening in severity throughout the day today.  In the ED he was found to have a UTI, given recent L ureteral stent placement urology was called for consult, they recommended CT scan to r/o hydronephrosis, CT renal showed Pyleonephritis of the L kidney, no evidence of hydroneprhosis.  Patient will be admitted to medicine, urology was informed of the results who agree with ABx at this point and will see patient in AM.  Review of Systems: 12 systems reviewed and otherwise negative  Past Medical History  Diagnosis Date  . Prostate cancer   . HTN (hypertension)    Past Surgical History  Procedure Date  . Left ureteral stent placement Week of 12/05/11   Social History:  does not have a smoking history on file. He does not have any smokeless tobacco history on file. His alcohol and drug histories not on file.   No Known Allergies  No family history on file. No one else in family has been sick.  Prior to Admission medications   Medication Sig Start Date End Date Taking? Authorizing Provider  acetaminophen (TYLENOL) 500 MG tablet Take 1,000 mg by mouth every 6 (six) hours as needed. For pain   Yes Historical Provider, MD  doxazosin (CARDURA) 4 MG tablet Take 4 mg by mouth every morning.   Yes Historical Provider, MD  hydrochlorothiazide (HYDRODIURIL) 25 MG tablet Take 25 mg by mouth every morning.   Yes Historical Provider, MD  metoprolol (LOPRESSOR) 50 MG  tablet Take 50 mg by mouth every morning.   Yes Historical Provider, MD  polyethylene glycol powder (GLYCOLAX/MIRALAX) powder Take 17 g by mouth daily.   Yes Historical Provider, MD  trandolapril-verapamil (TARKA) 4-240 MG per tablet Take 1 tablet by mouth every morning.   Yes Historical Provider, MD   Physical Exam: Filed Vitals:   12/09/12 2321 12/09/12 2323 12/10/12 0003 12/10/12 0247  BP: 143/51   114/50  Pulse: 87   66  Temp: 100.4 F (38 C)  102.2 F (39 C)   TempSrc: Oral  Rectal   Resp: 16   16  SpO2: 94% 96%  98%    General:  NAD, resting comfortably in bed Eyes: PEERLA EOMI ENT: mucous membranes moist Neck: supple w/o JVD Cardiovascular: RRR w/o MRG Respiratory: CTA B Abdomen: soft, nt, nd, bs+ Skin: no rash nor lesion Musculoskeletal: MAE, full ROM all 4 extremities Psychiatric: normal tone and affect Neurologic: AAOx3, grossly non-focal  Labs on Admission:  Basic Metabolic Panel:  Lab 12/10/12 8119 12/10/12 0050  NA 135 130*  K 3.3* 3.3*  CL 100 96  CO2 -- 24  GLUCOSE 196* 200*  BUN 32* 33*  CREATININE 1.60* 1.61*  CALCIUM -- 7.9*  MG -- --  PHOS -- --   Liver Function Tests:  Lab 12/10/12 0050  AST 13  ALT 11  ALKPHOS 69  BILITOT 0.6  PROT 6.0  ALBUMIN 2.7*    Lab 12/10/12 0050  LIPASE 13  AMYLASE --  No results found for this basename: AMMONIA:5 in the last 168 hours CBC:  Lab 12/10/12 0101 12/10/12 0050  WBC -- 21.2*  NEUTROABS -- 18.9*  HGB 11.6* 12.4*  HCT 34.0* 35.6*  MCV -- 83.2  PLT -- 169   Cardiac Enzymes: No results found for this basename: CKTOTAL:5,CKMB:5,CKMBINDEX:5,TROPONINI:5 in the last 168 hours  BNP (last 3 results) No results found for this basename: PROBNP:3 in the last 8760 hours CBG: No results found for this basename: GLUCAP:5 in the last 168 hours  Radiological Exams on Admission: Dg Chest Portable 1 View  12/10/2012  *RADIOLOGY REPORT*  Clinical Data: Fever, vomiting and weakness.  PORTABLE CHEST -  1 VIEW  Comparison: Chest radiograph performed 09/25/2012  Findings: The lungs are well expanded.  Mildly more prominent left basilar opacity likely reflects atelectasis, though pneumonia might have a similar appearance.  Mild vascular congestion is seen. Underlying chronic peribronchial thickening is noted.  No pleural effusion or pneumothorax is identified.  The cardiomediastinal silhouette is mildly enlarged.  No acute osseous abnormalities are identified.  There is minimal chronic irregularity involving several right-sided ribs.  IMPRESSION:  1.  Mildly more prominent left basilar airspace opacity likely reflects atelectasis, though pneumonia might have a similar appearance.  Underlying chronic peribronchial thickening noted. 2.  Mild cardiomegaly and mild vascular congestion noted.   Original Report Authenticated By: Tonia Ghent, M.D.     EKG: Independently reviewed.  Assessment/Plan Principal Problem:  *Pyelonephritis Active Problems:  UTI (lower urinary tract infection)  Sepsis   1. Pyelonephritis - complicated UTI with pyelonephritis and ureteral stent association.  Starting rocephin in ED, urine cultures pending, have ordered blood cultures.  Given stent placement urology consulted and will see patient in AM, no hydronephrosis evidence on CT scan to indicate need for emergent procedure tonight. 2. Sepsis - patient presenting with Leukocytosis of 21k and fever of 102.2, have ordered cultures, on rocephin for urinary source.  Strict Is and Os, having nursing place a foley since he caths normally. 3. H/o HTN - holding home meds at this point given a few low BP readings in ED and giving fluids at 125 cc/hr for right now.  MAP remains above 65 throughout his stay thus far.  It also is questionable how many of his BP meds are actually in his system as he notes he has been vomiting them up when he takes them for the past 2 days.  Restart after acute illness resolved or he becomes  hypertensive. 4. Elevated BGL - no h/o DM, will put on AC/HS CBG checks and low dose SSI.  Spoke with Dr. Dayton Martes of urology.  Code Status: Full Code (must indicate code status--if unknown or must be presumed, indicate so) Family Communication: Spoke with wife and daughter at bedside. (indicate person spoken with, if applicable, with phone number if by telephone) Disposition Plan: Admit to inpatient (indicate anticipated LOS)  Time spent: 70 min  GARDNER, JARED M. [facility] Pager [phone]  If 7PM-7AM, please contact night-coverage [email or web address] Password [email or web address] 12/10/2012, 2:47 AM

## 2012-12-11 DIAGNOSIS — N10 Acute tubulo-interstitial nephritis: Secondary | ICD-10-CM | POA: Diagnosis not present

## 2012-12-11 DIAGNOSIS — E871 Hypo-osmolality and hyponatremia: Secondary | ICD-10-CM | POA: Diagnosis present

## 2012-12-11 DIAGNOSIS — E876 Hypokalemia: Secondary | ICD-10-CM | POA: Diagnosis present

## 2012-12-11 DIAGNOSIS — Z8546 Personal history of malignant neoplasm of prostate: Secondary | ICD-10-CM | POA: Diagnosis not present

## 2012-12-11 DIAGNOSIS — N390 Urinary tract infection, site not specified: Secondary | ICD-10-CM | POA: Diagnosis not present

## 2012-12-11 DIAGNOSIS — N12 Tubulo-interstitial nephritis, not specified as acute or chronic: Secondary | ICD-10-CM | POA: Diagnosis not present

## 2012-12-11 LAB — CBC WITH DIFFERENTIAL/PLATELET
Basophils Relative: 0 % (ref 0–1)
HCT: 33.3 % — ABNORMAL LOW (ref 39.0–52.0)
Hemoglobin: 11.5 g/dL — ABNORMAL LOW (ref 13.0–17.0)
Lymphs Abs: 1.1 10*3/uL (ref 0.7–4.0)
MCH: 28.9 pg (ref 26.0–34.0)
MCHC: 34.5 g/dL (ref 30.0–36.0)
Monocytes Absolute: 1.2 10*3/uL — ABNORMAL HIGH (ref 0.1–1.0)
Monocytes Relative: 10 % (ref 3–12)
Neutro Abs: 9.8 10*3/uL — ABNORMAL HIGH (ref 1.7–7.7)
RBC: 3.98 MIL/uL — ABNORMAL LOW (ref 4.22–5.81)

## 2012-12-11 LAB — BASIC METABOLIC PANEL
BUN: 21 mg/dL (ref 6–23)
Creatinine, Ser: 1.04 mg/dL (ref 0.50–1.35)
GFR calc Af Amer: 78 mL/min — ABNORMAL LOW (ref >90)
GFR calc non Af Amer: 68 mL/min — ABNORMAL LOW (ref >90)

## 2012-12-11 LAB — GLUCOSE, CAPILLARY: Glucose-Capillary: 115 mg/dL — ABNORMAL HIGH (ref 70–99)

## 2012-12-11 MED ORDER — POTASSIUM CHLORIDE CRYS ER 20 MEQ PO TBCR
40.0000 meq | EXTENDED_RELEASE_TABLET | Freq: Once | ORAL | Status: AC
  Administered 2012-12-11: 40 meq via ORAL
  Filled 2012-12-11: qty 2

## 2012-12-11 NOTE — Progress Notes (Signed)
[facility] PROGRESS NOTE  Jeremiah Collins WUJ:[id number] DOB: [birth year] DOA: 12/09/2012 PCP: Provider Not In System  Assessment/Plan: 1. Pyelonephritis/UTI:  - Patient on Rocephin and at this point will continue this regimen - awaiting urine culture and sensitivity reports - Will tailor antibiotic regimen once sensitivities are back - Urologist on board given outpatient Urological history - Foley recommendations per Urology  2. Hyponatremia - Should continue to improve with improvement in oral intake.  - Will continue to monitor  3. Hypokalemia - Will replace orally  - recheck levels next am   Code Status: Full Family Communication: Spoke to patient and family member at bedside Disposition Plan: Pending further improvement in condition   Consultants:  Urology: Dr. Retta Diones  Procedures:  none  Antibiotics:  Rocephin since admission  HPI/Subjective: No new complaints.  Patient mentions that he feels better today.  No acute issues reported overnight.  Objective: Filed Vitals:   12/10/12 2220 12/11/12 0506 12/11/12 0815 12/11/12 1336  BP: 129/63 127/94 137/56 153/70  Pulse: 80 82 83 92  Temp: 100 F (37.8 C) 98.4 F (36.9 C) 98.8 F (37.1 C) 98.4 F (36.9 C)  TempSrc: Oral Oral Oral Oral  Resp: 16 18 16 18   Height:      Weight:      SpO2: 95% 94% 96% 93%    Intake/Output Summary (Last 24 hours) at 12/11/12 1431 Last data filed at 12/11/12 1336  Gross per 24 hour  Intake   2270 ml  Output   1450 ml  Net    820 ml   Filed Weights   12/10/12 0500  Weight: 117 kg (257 lb 15 oz)    Exam:   General:  Pt in NAD, Alert and Awake  Cardiovascular: RRR, no mrg  Respiratory: CTA BL, no wheezes, BL breath sounds  Abdomen: soft, NT, ND  Data Reviewed: Basic Metabolic Panel:  Lab 12/11/12 9562 12/10/12 0516 12/10/12 0101 12/10/12 0050  NA 134* 134* 135 130*  K 3.2* 3.4* 3.3* 3.3*  CL 102 101 100 96  CO2 22 24 -- 24  GLUCOSE 125* 154* 196*  200*  BUN 21 31* 32* 33*  CREATININE 1.04 1.39* 1.60* 1.61*  CALCIUM 7.8* 8.1* -- 7.9*  MG -- -- -- --  PHOS -- -- -- --   Liver Function Tests:  Lab 12/10/12 0050  AST 13  ALT 11  ALKPHOS 69  BILITOT 0.6  PROT 6.0  ALBUMIN 2.7*    Lab 12/10/12 0050  LIPASE 13  AMYLASE --   No results found for this basename: AMMONIA:5 in the last 168 hours CBC:  Lab 12/11/12 0458 12/10/12 0516 12/10/12 0101 12/10/12 0050  WBC 12.1* 18.9* -- 21.2*  NEUTROABS 9.8* -- -- 18.9*  HGB 11.5* 11.5* 11.6* 12.4*  HCT 33.3* 33.8* 34.0* 35.6*  MCV 83.7 83.3 -- 83.2  PLT 158 172 -- 169   Cardiac Enzymes: No results found for this basename: CKTOTAL:5,CKMB:5,CKMBINDEX:5,TROPONINI:5 in the last 168 hours BNP (last 3 results) No results found for this basename: PROBNP:3 in the last 8760 hours CBG:  Lab 12/11/12 1227 12/11/12 0729 12/10/12 2246 12/10/12 1657 12/10/12 1219  GLUCAP 105* 121* 115* 114* 117*    Recent Results (from the past 240 hour(s))  URINE CULTURE     Status: Normal (Preliminary result)   Collection Time   12/10/12 12:10 AM      Component Value Range Status Comment   Specimen Description URINE, CATHETERIZED   Final    Special  Requests NONE   Final    Culture  Setup Time 12/10/2012 16:55   Final    Colony Count >=100,000 COLONIES/ML   Final    Culture GRAM NEGATIVE RODS   Final    Report Status PENDING   Incomplete   CULTURE, BLOOD (ROUTINE X 2)     Status: Normal (Preliminary result)   Collection Time   12/10/12  2:45 AM      Component Value Range Status Comment   Specimen Description BLOOD RIGHT HAND   Final    Special Requests BOTTLES DRAWN AEROBIC AND ANAEROBIC 5CC   Final    Culture  Setup Time 12/10/2012 15:01   Final    Culture     Final    Value:        BLOOD CULTURE RECEIVED NO GROWTH TO DATE CULTURE WILL BE HELD FOR 5 DAYS BEFORE ISSUING A FINAL NEGATIVE REPORT   Report Status PENDING   Incomplete   CULTURE, BLOOD (ROUTINE X 2)     Status: Normal (Preliminary  result)   Collection Time   12/10/12  2:50 AM      Component Value Range Status Comment   Specimen Description BLOOD LEFT HAND   Final    Special Requests BOTTLES DRAWN AEROBIC AND ANAEROBIC 5CC   Final    Culture  Setup Time 12/10/2012 15:01   Final    Culture     Final    Value:        BLOOD CULTURE RECEIVED NO GROWTH TO DATE CULTURE WILL BE HELD FOR 5 DAYS BEFORE ISSUING A FINAL NEGATIVE REPORT   Report Status PENDING   Incomplete   SURGICAL PCR SCREEN     Status: Abnormal   Collection Time   12/10/12  6:42 AM      Component Value Range Status Comment   MRSA, PCR NEGATIVE  NEGATIVE Final    Staphylococcus aureus POSITIVE (*) NEGATIVE Final      Studies: Ct Abdomen Pelvis Wo Contrast  12/10/2012  *RADIOLOGY REPORT*  Clinical Data: Nausea and vomiting; diarrhea.  Weakness.  CT ABDOMEN AND PELVIS WITHOUT CONTRAST  Technique:  Multidetector CT imaging of the abdomen and pelvis was performed following the standard protocol without intravenous contrast.  Comparison: CT of the abdomen and pelvis performed 06/22/2012  Findings: Minimal bibasilar atelectasis is noted.  Mild calcification is noted at the mitral and aortic valves.  The liver and spleen are unremarkable in appearance. There is a small apparent nodular extension at the fundus of the gallbladder, measuring 1.3 cm.  This has a soft tissue attenuation, and appears mildly increased in size from the prior study; right upper quadrant abdominal ultrasound would be helpful for further evaluation, to exclude a small soft tissue mass.  Alternatively, it could simply reflect sludge within a Phrygian cap.  The pancreas and right adrenal gland are unremarkable.  A small focus of fat is noted within the left adrenal gland.  There is increased perinephric stranding and fluid on the left side, with stranding about Gerota's fascia, and fluid tracking inferiorly to the left pelvic sidewall.  This extends adjacent to the course of the left ureter, with a left  ureteral stent noted in expected position.  Findings raise concern for left-sided pyelonephritis and ureteritis.  There is also mild soft tissue stranding about the bladder, raising question for cystitis.  There is no evidence of hydronephrosis.  No renal or ureteral stones are seen.  Mild nonspecific right-sided perinephric stranding is noted.  The right kidney is otherwise unremarkable in appearance.  No free fluid is identified.  The small bowel is unremarkable in appearance.  The stomach is within normal limits.  No acute vascular abnormalities are seen.  Diffuse calcification is noted along the abdominal aorta and its branches.  The appendix is normal in caliber, without evidence for appendicitis.  Mild scattered diverticulosis is noted along the mid sigmoid colon.  The colon is otherwise unremarkable in appearance.  The bladder is mildly distended; mild surrounding soft tissue stranding is noted, as described above.  Postoperative change is seen about the prostate bed.  No inguinal lymphadenopathy is seen.  No acute osseous abnormalities are identified.  Mild vacuum phenomenon is noted at multiple levels along the lower lumbar spine, with narrowing of the intervertebral disc space at L5-S1.  IMPRESSION:  1.  Increased perinephric stranding and fluid at the left kidney, with stranding about along Gerota's fascia, and fluid tracking inferiorly to the left pelvic sidewall.  Left ureteral stent noted in expected position.  Findings raise concern for left-sided pyelonephritis and ureteritis.  Mild soft tissue stranding about the bladder raises question for cystitis. 2.  No evidence of hydronephrosis; no renal or ureteral stones seen. 3.  Small apparent nodular extension at the fundus of the gallbladder, measuring 1.3 cm.  This has soft tissue attenuation; right upper quadrant abdominal ultrasound would be helpful for further evaluation on an elective non-emergent basis, to exclude a small soft tissue mass.   Alternatively, it could simply reflect sludge within a Phrygian cap. 4.  Diffuse calcification along the abdominal aorta and its branches. 5.  Mild scattered diverticulosis along the mid sigmoid colon. 6.  Mild degenerative change at the lower lumbar spine.   Original Report Authenticated By: Tonia Ghent, M.D.    Dg Chest Portable 1 View  12/10/2012  *RADIOLOGY REPORT*  Clinical Data: Fever, vomiting and weakness.  PORTABLE CHEST - 1 VIEW  Comparison: Chest radiograph performed 09/25/2012  Findings: The lungs are well expanded.  Mildly more prominent left basilar opacity likely reflects atelectasis, though pneumonia might have a similar appearance.  Mild vascular congestion is seen. Underlying chronic peribronchial thickening is noted.  No pleural effusion or pneumothorax is identified.  The cardiomediastinal silhouette is mildly enlarged.  No acute osseous abnormalities are identified.  There is minimal chronic irregularity involving several right-sided ribs.  IMPRESSION:  1.  Mildly more prominent left basilar airspace opacity likely reflects atelectasis, though pneumonia might have a similar appearance.  Underlying chronic peribronchial thickening noted. 2.  Mild cardiomegaly and mild vascular congestion noted.   Original Report Authenticated By: Tonia Ghent, M.D.     Scheduled Meds:   . cefTRIAXone (ROCEPHIN)  IV  1 g Intravenous Q24H  . Chlorhexidine Gluconate Cloth  6 each Topical Daily  . heparin  5,000 Units Subcutaneous Q8H  . insulin aspart  0-9 Units Subcutaneous TID WC  . mupirocin ointment  1 application Nasal BID  . polyethylene glycol  17 g Oral Daily   Continuous Infusions:   . sodium chloride Stopped (12/10/12 0250)    Principal Problem:  *Pyelonephritis Active Problems:  UTI (lower urinary tract infection)  Sepsis  Hypokalemia  Hyponatremia    Time spent: >35 minutes    Penny Pia  [facility] Pager [phone] If 8PM-8AM, please contact night-coverage  at [email or web address], password [facility] 12/11/2012, 2:31 PM  LOS: 2 days

## 2012-12-11 NOTE — Progress Notes (Signed)
Nutrition Brief Note  Patient identified on the Malnutrition Screening Tool (MST) Report  Body mass index is 35.98 kg/(m^2). Patient meets criteria for obesity based on current BMI.   Current diet order is CHO Mod Med, patient is consuming approximately 100% of meals at this time. Labs and medications reviewed.   Pt admitted with complicated UTI with pyelonephritis and ureteral srent association. Pt denies wt change or poor intake.  Does endorse decrease in appetite but is currently eating 50-100% of meals.  Denies additional related concerns.  No nutrition interventions warranted at this time. If nutrition issues arise, please consult RD.   Loyce Dys, MS RD LDN Clinical Inpatient Dietitian Pager: [phone] Weekend/After hours pager: [phone]

## 2012-12-11 NOTE — Clinical Documentation Improvement (Signed)
Abnormal Labs Clarification  THIS DOCUMENT IS NOT A PERMANENT PART OF THE MEDICAL RECORD  TO RESPOND TO THE THIS QUERY, FOLLOW THE INSTRUCTIONS BELOW:  1. If needed, update documentation for the patient's encounter via the notes activity.  2. Access this query again and click edit on the [employer].  3. After updating, or not, click F2 to complete all highlighted (required) fields concerning your review. Select "additional documentation in the medical record" OR "no additional documentation provided".  4. Click Sign note button.  5. The deficiency will fall out of your InBasket *Please let us know if you are not able to complete this workflow by phone or e-mail (listed below).  Please update your documentation within the medical record to reflect your response to this query.                                                                                   12/11/12  Dear Dr. Cena Benton, [facility] [place] [place]  In a better effort to capture your patient's severity of illness, reflect appropriate length of stay and utilization of resources, a review of the medical record has revealed the following indicators.    Based on your clinical judgment, please clarify and document in a progress note and/or discharge summary the clinical condition associated with the following supporting information:  In responding to this query please exercise your independent judgment.  The fact that a query is asked, does not imply that any particular answer is desired or expected.  Abnormal findings (laboratory, x-ray, pathologic, and other diagnostic results) are not coded and reported unless the physician indicates their clinical significance.   The medical record reflects the following clinical findings, please clarify the diagnostic and/or clinical significance:       In this pt with Sepsis, Pyelonephritis, and UTI s/p recent stents a review of the medical record reveals the following:   Abnormal  BUN/CR=32/1.60  GRF=(31-48)   Clarification Needed   Please clarify the underlying diagnosis responsible for the abnormal BUN/CR/GFR and document in pn or d/c summary.  Thank you for all that you do for our patients!    Possible Clinical Conditions?                                   Other Condition___________________                 Cannot Clinically Determine_________   Risk Factors:  Pyelonephritis, UTI, sepsis, Prostate ca, HTN,    Diagnostics: Component     Latest Ref Rng 12/10/2012 12/10/2012        12:50 AM  1:01 AM  BUN     6 - 23 mg/dL 33 (H) 32 (H)  Creatinine     0.50 - 1.35 mg/dL 9.60 (H) 4.54 (H)   Component     Latest Ref Rng 12/10/2012         5:16 AM  BUN     6 - 23 mg/dL 31 (H)  Creatinine     0.50 - 1.35 mg/dL 0.98 (H)   Component     Latest Ref Rng 12/10/2012  12:50 AM  GFR calc non Af Amer     >90 mL/min 40 (L)   Component     Latest Ref Rng 12/10/2012         5:16 AM  GFR calc non Af Amer     >90 mL/min 48 (L)   Treatment:  Monitoring .9 % sodium chloride infusion    Reviewed:  no additional documentation provided ljh   Thank You,  Enis Slipper  RN, BSN, MSN/Inf, CCDS Clinical Documentation Specialist [facility] Pager: [phone] / E-mail: [email or web address]  Health Information Management [place]

## 2012-12-11 NOTE — Progress Notes (Signed)
Subjective: Patient reports that he is feeling better. He is having no bladder pain. He has had no shakes or chills.  Objective: Vital signs in last 24 hours: Temp:  [98.4 F (36.9 C)-100 F (37.8 C)] 98.8 F (37.1 C) (01/27 0815) Pulse Rate:  [80-104] 83  (01/27 0815) Resp:  [16-18] 16  (01/27 0815) BP: (127-152)/(56-94) 137/56 mmHg (01/27 0815) SpO2:  [90 %-96 %] 96 % (01/27 0815)  Intake/Output from previous day: 01/26 0701 - 01/27 0700 In: 2150 [P.O.:600; I.V.:1500; IV Piggyback:50] Out: 1075 [Urine:1075] Intake/Output this shift:    Physical Exam:  Constitutional: Vital signs reviewed. WD WN in NAD   Eyes: PERRL, No scleral icterus.     His urine is amber, without clots  Lab Results:  Basename 12/11/12 0458 12/10/12 0516 12/10/12 0101  HGB 11.5* 11.5* 11.6*  HCT 33.3* 33.8* 34.0*   BMET  Basename 12/11/12 0458 12/10/12 0516  NA 134* 134*  K 3.2* 3.4*  CL 102 101  CO2 22 24  GLUCOSE 125* 154*  BUN 21 31*  CREATININE 1.04 1.39*  CALCIUM 7.8* 8.1*   No results found for this basename: LABPT:3,INR:3 in the last 72 hours No results found for this basename: LABURIN:1 in the last 72 hours Results for orders placed during the hospital encounter of 12/09/12  CULTURE, BLOOD (ROUTINE X 2)     Status: Normal (Preliminary result)   Collection Time   12/10/12  2:45 AM      Component Value Range Status Comment   Specimen Description BLOOD RIGHT HAND   Final    Special Requests BOTTLES DRAWN AEROBIC AND ANAEROBIC 5CC   Final    Culture  Setup Time 12/10/2012 15:01   Final    Culture     Final    Value:        BLOOD CULTURE RECEIVED NO GROWTH TO DATE CULTURE WILL BE HELD FOR 5 DAYS BEFORE ISSUING A FINAL NEGATIVE REPORT   Report Status PENDING   Incomplete   CULTURE, BLOOD (ROUTINE X 2)     Status: Normal (Preliminary result)   Collection Time   12/10/12  2:50 AM      Component Value Range Status Comment   Specimen Description BLOOD LEFT HAND   Final    Special  Requests BOTTLES DRAWN AEROBIC AND ANAEROBIC 5CC   Final    Culture  Setup Time 12/10/2012 15:01   Final    Culture     Final    Value:        BLOOD CULTURE RECEIVED NO GROWTH TO DATE CULTURE WILL BE HELD FOR 5 DAYS BEFORE ISSUING A FINAL NEGATIVE REPORT   Report Status PENDING   Incomplete   SURGICAL PCR SCREEN     Status: Abnormal   Collection Time   12/10/12  6:42 AM      Component Value Range Status Comment   MRSA, PCR NEGATIVE  NEGATIVE Final    Staphylococcus aureus POSITIVE (*) NEGATIVE Final     Studies/Results: Ct Abdomen Pelvis Wo Contrast  12/10/2012  *RADIOLOGY REPORT*  Clinical Data: Nausea and vomiting; diarrhea.  Weakness.  CT ABDOMEN AND PELVIS WITHOUT CONTRAST  Technique:  Multidetector CT imaging of the abdomen and pelvis was performed following the standard protocol without intravenous contrast.  Comparison: CT of the abdomen and pelvis performed 06/22/2012  Findings: Minimal bibasilar atelectasis is noted.  Mild calcification is noted at the mitral and aortic valves.  The liver and spleen are unremarkable in appearance. There  is a small apparent nodular extension at the fundus of the gallbladder, measuring 1.3 cm.  This has a soft tissue attenuation, and appears mildly increased in size from the prior study; right upper quadrant abdominal ultrasound would be helpful for further evaluation, to exclude a small soft tissue mass.  Alternatively, it could simply reflect sludge within a Phrygian cap.  The pancreas and right adrenal gland are unremarkable.  A small focus of fat is noted within the left adrenal gland.  There is increased perinephric stranding and fluid on the left side, with stranding about Gerota's fascia, and fluid tracking inferiorly to the left pelvic sidewall.  This extends adjacent to the course of the left ureter, with a left ureteral stent noted in expected position.  Findings raise concern for left-sided pyelonephritis and ureteritis.  There is also mild soft  tissue stranding about the bladder, raising question for cystitis.  There is no evidence of hydronephrosis.  No renal or ureteral stones are seen.  Mild nonspecific right-sided perinephric stranding is noted.  The right kidney is otherwise unremarkable in appearance.  No free fluid is identified.  The small bowel is unremarkable in appearance.  The stomach is within normal limits.  No acute vascular abnormalities are seen.  Diffuse calcification is noted along the abdominal aorta and its branches.  The appendix is normal in caliber, without evidence for appendicitis.  Mild scattered diverticulosis is noted along the mid sigmoid colon.  The colon is otherwise unremarkable in appearance.  The bladder is mildly distended; mild surrounding soft tissue stranding is noted, as described above.  Postoperative change is seen about the prostate bed.  No inguinal lymphadenopathy is seen.  No acute osseous abnormalities are identified.  Mild vacuum phenomenon is noted at multiple levels along the lower lumbar spine, with narrowing of the intervertebral disc space at L5-S1.  IMPRESSION:  1.  Increased perinephric stranding and fluid at the left kidney, with stranding about along Gerota's fascia, and fluid tracking inferiorly to the left pelvic sidewall.  Left ureteral stent noted in expected position.  Findings raise concern for left-sided pyelonephritis and ureteritis.  Mild soft tissue stranding about the bladder raises question for cystitis. 2.  No evidence of hydronephrosis; no renal or ureteral stones seen. 3.  Small apparent nodular extension at the fundus of the gallbladder, measuring 1.3 cm.  This has soft tissue attenuation; right upper quadrant abdominal ultrasound would be helpful for further evaluation on an elective non-emergent basis, to exclude a small soft tissue mass.  Alternatively, it could simply reflect sludge within a Phrygian cap. 4.  Diffuse calcification along the abdominal aorta and its branches. 5.   Mild scattered diverticulosis along the mid sigmoid colon. 6.  Mild degenerative change at the lower lumbar spine.   Original Report Authenticated By: Tonia Ghent, M.D.    Dg Chest Portable 1 View  12/10/2012  *RADIOLOGY REPORT*  Clinical Data: Fever, vomiting and weakness.  PORTABLE CHEST - 1 VIEW  Comparison: Chest radiograph performed 09/25/2012  Findings: The lungs are well expanded.  Mildly more prominent left basilar opacity likely reflects atelectasis, though pneumonia might have a similar appearance.  Mild vascular congestion is seen. Underlying chronic peribronchial thickening is noted.  No pleural effusion or pneumothorax is identified.  The cardiomediastinal silhouette is mildly enlarged.  No acute osseous abnormalities are identified.  There is minimal chronic irregularity involving several right-sided ribs.  IMPRESSION:  1.  Mildly more prominent left basilar airspace opacity likely reflects atelectasis, though pneumonia  might have a similar appearance.  Underlying chronic peribronchial thickening noted. 2.  Mild cardiomegaly and mild vascular congestion noted.   Original Report Authenticated By: Tonia Ghent, M.D.     Assessment/Plan:  1. Pyelonephritis. He had a recent endoscopic intervention of at least his bladder. I do not know the specific procedure that he had done, but he was left with a stent. Stent seems to be draining effectively/appropriately, without evidence of hydronephrosis on the left. He seems to be clinically improving.   2. Adenocarcinoma of the prostate, status post radical retropubic prostatectomy in 1996, with salvage radiotherapy in 1999. PSA is 3.2, and has been on an upward trend over the past few years. He is not on any adjuvant therapy at the present time. He is asymptomatic and by report has had a negative bone scan. CT scan reveals no evidence of osseous metastatic disease.   Plan:  1. Continue IV antibiotics, await culture result  2. I think his  catheter can come out in the morning.  3. I would think he would be able to go home a day or 2 if his creatinine is a bit better, and he is afebrile and identification of his pathogen has been made.   LOS: 2 days   Jeremiah Collins M 12/11/2012, 9:47 AM

## 2012-12-11 NOTE — Clinical Documentation Improvement (Signed)
SEPSIS DOCUMENTATION QUERY  THIS DOCUMENT IS NOT A PERMANENT PART OF THE MEDICAL RECORD  TO RESPOND TO THE THIS QUERY, FOLLOW THE INSTRUCTIONS BELOW:  1. If needed, update documentation for the patient's encounter via the notes activity.  2. Access this query again and click edit on the In [employer].  3. After updating, or not, click F2 to complete all highlighted (required) fields concerning your review. Select "additional documentation in the medical record" OR "no additional documentation provided".  4. Click Sign note button.  5. The deficiency will fall out of your In Basket *Please let us know if you are not able to complete this workflow by phone or e-mail (listed below).  Please update your documentation within the medical record to reflect your response to this query.                                                                                    12/11/12  Dear Dr. Cena Benton, [facility],  In a better effort to capture your patient's severity of illness, reflect appropriate length of stay and utilization of resources, a review of the patient medical record has revealed the following indicators.    Based on your clinical judgment, please clarify and document in a progress note and/or discharge summary the clinical condition associated with the following supporting information:  In responding to this query please exercise your independent judgment.  The fact that a query is asked, does not imply that any particular answer is desired or expected.   Clarification Needed on the following:     Please clarify if sepsis can be linked/ or due to ureteral stents or other underlying diagosis in setting of "pyelonephritis" and "complicated UTI."  Please note if pt's lab result of Staphylcococcus Aureus can be linked to the UTI, Pyelonephritis,  or other diagnoses   Please document any updates in the pn or d/c summary  Thank you for all that you do for our patients!    Possible  Clinical Conditions?  Severe Sepsis SIRS Sepsis with Pyelonephritis Sepsis with UTI Sepsis due to an internal device ( ureteral stents or other device) Bacterial infection of unknown etiology / source Other Condition __________________ Cannot clinically Determine _______________  Risk Factors: Pyelonephritis. UTI, Sepsis, Ureteral stents  Presenting Signs and Symptoms: Pt self caths  Diagnostics: Component     Latest Ref Rng 12/10/2012         6:42 AM  Staphylococcus aureus     NEGATIVE POSITIVE (A)   Component     Latest Ref Rng 12/10/2012        12:50 AM  WBC     4.0 - 10.5 K/uL 21.2 (H)   Component     Latest Ref Rng 12/10/2012         5:16 AM  WBC     4.0 - 10.5 K/uL 18.9 (H)   Component     Latest Ref Rng 12/11/2012         4:58 AM  WBC     4.0 - 10.5 K/uL 12.1 (H)    Treatment: Rocephin  Reviewed:  no additional documentation provided ljh  Thank You,  Cindie Laroche  Ambrose Mantle  RN, BSN, MSN/Inf, CCDS Clinical Documentation Specialist [facility] Dept Pager: [phone] / E-mail: [email or web address]  Health Information Management [place]

## 2012-12-11 NOTE — Care Management Note (Signed)
    Page 1 of 1   12/13/2012     12:18:55 PM   CARE MANAGEMENT NOTE 12/13/2012  Patient:  Jeremiah Collins, Jeremiah Collins   Account Number:  [id number]  Date Initiated:  12/11/2012  Documentation initiated by:  Lanier Clam  Subjective/Objective Assessment:   ADMITTED W/N/V/WEAKNESS.PYELONEPHRITIS.     Action/Plan:   FROM HOME W/FAMILY.HAS PCP,PHARMACY.   Anticipated DC Date:  12/12/2012   Anticipated DC Plan:  HOME/SELF CARE      DC Planning Services  CM consult      Choice offered to / List presented to:             Status of service:  Completed, signed off Medicare Important Message given?   (If response is "NO", the following Medicare IM given date fields will be blank) Date Medicare IM given:   Date Additional Medicare IM given:    Discharge Disposition:  HOME/SELF CARE  Per UR Regulation:  Reviewed for med. necessity/level of care/duration of stay  If discussed at Long Length of Stay Meetings, dates discussed:    Comments:  12/11/12 [facility] RN,BSN NCM [phone]

## 2012-12-12 DIAGNOSIS — N390 Urinary tract infection, site not specified: Secondary | ICD-10-CM | POA: Diagnosis not present

## 2012-12-12 DIAGNOSIS — N10 Acute tubulo-interstitial nephritis: Secondary | ICD-10-CM | POA: Diagnosis not present

## 2012-12-12 DIAGNOSIS — E876 Hypokalemia: Secondary | ICD-10-CM | POA: Diagnosis not present

## 2012-12-12 DIAGNOSIS — Z8546 Personal history of malignant neoplasm of prostate: Secondary | ICD-10-CM | POA: Diagnosis not present

## 2012-12-12 DIAGNOSIS — N12 Tubulo-interstitial nephritis, not specified as acute or chronic: Secondary | ICD-10-CM | POA: Diagnosis not present

## 2012-12-12 DIAGNOSIS — E871 Hypo-osmolality and hyponatremia: Secondary | ICD-10-CM | POA: Diagnosis not present

## 2012-12-12 LAB — BASIC METABOLIC PANEL
BUN: 19 mg/dL (ref 6–23)
CO2: 24 mEq/L (ref 19–32)
Calcium: 7.9 mg/dL — ABNORMAL LOW (ref 8.4–10.5)
Chloride: 104 mEq/L (ref 96–112)
Creatinine, Ser: 0.91 mg/dL (ref 0.50–1.35)
Glucose, Bld: 113 mg/dL — ABNORMAL HIGH (ref 70–99)

## 2012-12-12 LAB — URINE CULTURE

## 2012-12-12 LAB — CBC
HCT: 33 % — ABNORMAL LOW (ref 39.0–52.0)
MCH: 28.5 pg (ref 26.0–34.0)
MCV: 82.5 fL (ref 78.0–100.0)
Platelets: 163 10*3/uL (ref 150–400)
RBC: 4 MIL/uL — ABNORMAL LOW (ref 4.22–5.81)

## 2012-12-12 LAB — GLUCOSE, CAPILLARY
Glucose-Capillary: 121 mg/dL — ABNORMAL HIGH (ref 70–99)
Glucose-Capillary: 104 mg/dL — ABNORMAL HIGH (ref 70–99)

## 2012-12-12 MED ORDER — CIPROFLOXACIN HCL 500 MG PO TABS: 500.0000 mg | ORAL_TABLET | Freq: Two times a day (BID) | ORAL | Status: DC

## 2012-12-12 NOTE — Progress Notes (Signed)
Discharge instructions explained and prescriptions given. Patient verbalizes understanding of same, stable on discharge.

## 2012-12-12 NOTE — Discharge Summary (Addendum)
Physician Discharge Summary  Jeremiah Collins ZOX:[id number] DOB: 03-01-1934 DOA: 12/09/2012  PCP: Provider Not In System  Admit date: 12/09/2012 Discharge date: 12/12/2012  Time spent: >35  minutes  Recommendations for Outpatient Follow-up:  1. Please be sure to follow up with patient clinically and based on post hospital evaluation decide whether or not patient will require a more prolonged antibiotic course. 2. Will provide cipro for 6 more days to complete an 8 day total course and provide refill in the unexpected chance patient will require a longer antibiotic regimen 3. Check potassium levels.  Discharge Diagnoses:  Principal Problem:  *Pyelonephritis Active Problems:  UTI (lower urinary tract infection)  Sepsis  Hypokalemia  Hyponatremia   Discharge Condition: stable  Diet recommendation: Carb modified  Filed Weights   12/10/12 0500  Weight: 117 kg (257 lb 15 oz)    History of present illness:  From original HPI: Jeremiah Collins is a 79 y.o. male who presents with c/o N/V/D, weakness, fever, chills. N/V/D onset yesterday followed by fever and chills today. Remote h/o prostate cancer, self caths, also had a ureteral stent placed by urology at [facility] last week. His moderately severe symptoms have been worsening in severity throughout the day today.  In the ED he was found to have a UTI, given recent L ureteral stent placement urology was called for consult, they recommended CT scan to r/o hydronephrosis, CT renal showed Pyleonephritis of the L kidney, no evidence of hydroneprhosis. Patient will be admitted to medicine, urology was informed of the results who agree with ABx at this point and will see patient in AM.   Hospital Course:  1. Pyelonephritis/UTI:  - Addendum: Improved on Rocephin will discharge on cipro to complete an 8 day total regimen with refill - Sensitivities show Klebsiella pan sensitive except for Ampicillin.  Will place on cipro - Will tailor  antibiotic regimen once sensitivities are back  - Urologist on board given outpatient Urological history and they recommended the following: Plan:  1. At this point, I think he can be switched to an oral medication, and he can go home if he voids  2. I will have catheter removed for voiding trial.  3. I would recommend a follow up with Dr. Gaynelle Arabian at [facility] at his scheduled appointment in early/mid February  Patient had catheter removed and voided twice with no reported complications.  2. Hyponatremia  - Resolved with improved oral intake.  3. Hypokalemia  - Will replace orally  - recommend patient have his potassium rechecked as outpatient.  Given improved oral intake suspect this should resolve.   Procedures: None  Consultations:  Urology: Dr. Janifer Adie  Discharge Exam: Filed Vitals:   12/11/12 1336 12/11/12 2231 12/12/12 0521 12/12/12 1300  BP: 153/70 109/68 144/71 134/62  Pulse: 92 86 84 87  Temp: 98.4 F (36.9 C) 100.4 F (38 C) 99.2 F (37.3 C) 98.7 F (37.1 C)  TempSrc: Oral Oral Oral Oral  Resp: 18 20 18 17   Height:      Weight:      SpO2: 93% 95% 95% 96%    General: Pt in NAD, Alert and Awake Cardiovascular: RRR, No MRG Respiratory: CTA BL, no wheezes Abdomen: soft, NT, ND  Discharge Instructions  Discharge Orders    Future Orders Please Complete By Expires   Diet - low sodium heart healthy      Increase activity slowly      Discharge instructions  Comments:   Please be sure to follow up with your urologist Dr. Gaynelle Arabian at [facility].   Follow up with your primary care physician in 1-2 weeks or sooner should any new concerns arise.   Call MD for:  temperature >100.4      Call MD for:  severe uncontrolled pain      Call MD for:  redness, tenderness, or signs of infection (pain, swelling, redness, odor or green/yellow discharge around incision site)          Medication List      As of 12/12/2012  4:24 PM    STOP taking these medications         hydrochlorothiazide 25 MG tablet   Commonly known as: HYDRODIURIL      trandolapril-verapamil 4-240 MG per tablet   Commonly known as: TARKA      TAKE these medications         acetaminophen 500 MG tablet   Commonly known as: TYLENOL   Take 1,000 mg by mouth every 6 (six) hours as needed. For pain      ciprofloxacin 500 MG tablet   Commonly known as: CIPRO   Take 1 tablet (500 mg total) by mouth 2 (two) times daily.      doxazosin 4 MG tablet   Commonly known as: CARDURA   Take 4 mg by mouth every morning.      metoprolol 50 MG tablet   Commonly known as: LOPRESSOR   Take 50 mg by mouth every morning.      polyethylene glycol powder powder   Commonly known as: GLYCOLAX/MIRALAX   Take 17 g by mouth daily.          The results of significant diagnostics from this hospitalization (including imaging, microbiology, ancillary and laboratory) are listed below for reference.    Significant Diagnostic Studies: Ct Abdomen Pelvis Wo Contrast  12/10/2012  *RADIOLOGY REPORT*  Clinical Data: Nausea and vomiting; diarrhea.  Weakness.  CT ABDOMEN AND PELVIS WITHOUT CONTRAST  Technique:  Multidetector CT imaging of the abdomen and pelvis was performed following the standard protocol without intravenous contrast.  Comparison: CT of the abdomen and pelvis performed 06/22/2012  Findings: Minimal bibasilar atelectasis is noted.  Mild calcification is noted at the mitral and aortic valves.  The liver and spleen are unremarkable in appearance. There is a small apparent nodular extension at the fundus of the gallbladder, measuring 1.3 cm.  This has a soft tissue attenuation, and appears mildly increased in size from the prior study; right upper quadrant abdominal ultrasound would be helpful for further evaluation, to exclude a small soft tissue mass.  Alternatively, it could simply reflect sludge within a Phrygian cap.  The  pancreas and right adrenal gland are unremarkable.  A small focus of fat is noted within the left adrenal gland.  There is increased perinephric stranding and fluid on the left side, with stranding about Gerota's fascia, and fluid tracking inferiorly to the left pelvic sidewall.  This extends adjacent to the course of the left ureter, with a left ureteral stent noted in expected position.  Findings raise concern for left-sided pyelonephritis and ureteritis.  There is also mild soft tissue stranding about the bladder, raising question for cystitis.  There is no evidence of hydronephrosis.  No renal or ureteral stones are seen.  Mild nonspecific right-sided perinephric stranding is noted.  The right kidney is otherwise unremarkable in appearance.  No free fluid is identified.  The small  bowel is unremarkable in appearance.  The stomach is within normal limits.  No acute vascular abnormalities are seen.  Diffuse calcification is noted along the abdominal aorta and its branches.  The appendix is normal in caliber, without evidence for appendicitis.  Mild scattered diverticulosis is noted along the mid sigmoid colon.  The colon is otherwise unremarkable in appearance.  The bladder is mildly distended; mild surrounding soft tissue stranding is noted, as described above.  Postoperative change is seen about the prostate bed.  No inguinal lymphadenopathy is seen.  No acute osseous abnormalities are identified.  Mild vacuum phenomenon is noted at multiple levels along the lower lumbar spine, with narrowing of the intervertebral disc space at L5-S1.  IMPRESSION:  1.  Increased perinephric stranding and fluid at the left kidney, with stranding about along Gerota's fascia, and fluid tracking inferiorly to the left pelvic sidewall.  Left ureteral stent noted in expected position.  Findings raise concern for left-sided pyelonephritis and ureteritis.  Mild soft tissue stranding about the bladder raises question for cystitis. 2.  No  evidence of hydronephrosis; no renal or ureteral stones seen. 3.  Small apparent nodular extension at the fundus of the gallbladder, measuring 1.3 cm.  This has soft tissue attenuation; right upper quadrant abdominal ultrasound would be helpful for further evaluation on an elective non-emergent basis, to exclude a small soft tissue mass.  Alternatively, it could simply reflect sludge within a Phrygian cap. 4.  Diffuse calcification along the abdominal aorta and its branches. 5.  Mild scattered diverticulosis along the mid sigmoid colon. 6.  Mild degenerative change at the lower lumbar spine.   Original Report Authenticated By: Tonia Ghent, M.D.    Dg Chest Portable 1 View  12/10/2012  *RADIOLOGY REPORT*  Clinical Data: Fever, vomiting and weakness.  PORTABLE CHEST - 1 VIEW  Comparison: Chest radiograph performed 09/25/2012  Findings: The lungs are well expanded.  Mildly more prominent left basilar opacity likely reflects atelectasis, though pneumonia might have a similar appearance.  Mild vascular congestion is seen. Underlying chronic peribronchial thickening is noted.  No pleural effusion or pneumothorax is identified.  The cardiomediastinal silhouette is mildly enlarged.  No acute osseous abnormalities are identified.  There is minimal chronic irregularity involving several right-sided ribs.  IMPRESSION:  1.  Mildly more prominent left basilar airspace opacity likely reflects atelectasis, though pneumonia might have a similar appearance.  Underlying chronic peribronchial thickening noted. 2.  Mild cardiomegaly and mild vascular congestion noted.   Original Report Authenticated By: Tonia Ghent, M.D.     Microbiology: Recent Results (from the past 240 hour(s))  URINE CULTURE     Status: Normal   Collection Time   12/10/12 12:10 AM      Component Value Range Status Comment   Specimen Description URINE, CATHETERIZED   Final    Special Requests NONE   Final    Culture  Setup Time 12/10/2012 16:55    Final    Colony Count >=100,000 COLONIES/ML   Final    Culture KLEBSIELLA SPECIES   Final    Report Status 12/12/2012 FINAL   Final    Organism ID, Bacteria KLEBSIELLA SPECIES   Final   CULTURE, BLOOD (ROUTINE X 2)     Status: Normal (Preliminary result)   Collection Time   12/10/12  2:45 AM      Component Value Range Status Comment   Specimen Description BLOOD RIGHT HAND   Final    Special Requests BOTTLES DRAWN AEROBIC AND ANAEROBIC  5CC   Final    Culture  Setup Time 12/10/2012 15:01   Final    Culture     Final    Value:        BLOOD CULTURE RECEIVED NO GROWTH TO DATE CULTURE WILL BE HELD FOR 5 DAYS BEFORE ISSUING A FINAL NEGATIVE REPORT   Report Status PENDING   Incomplete   CULTURE, BLOOD (ROUTINE X 2)     Status: Normal (Preliminary result)   Collection Time   12/10/12  2:50 AM      Component Value Range Status Comment   Specimen Description BLOOD LEFT HAND   Final    Special Requests BOTTLES DRAWN AEROBIC AND ANAEROBIC 5CC   Final    Culture  Setup Time 12/10/2012 15:01   Final    Culture     Final    Value:        BLOOD CULTURE RECEIVED NO GROWTH TO DATE CULTURE WILL BE HELD FOR 5 DAYS BEFORE ISSUING A FINAL NEGATIVE REPORT   Report Status PENDING   Incomplete   SURGICAL PCR SCREEN     Status: Abnormal   Collection Time   12/10/12  6:42 AM      Component Value Range Status Comment   MRSA, PCR NEGATIVE  NEGATIVE Final    Staphylococcus aureus POSITIVE (*) NEGATIVE Final      Labs: Basic Metabolic Panel:  Lab 12/12/12 9629 12/11/12 0458 12/10/12 0516 12/10/12 0101 12/10/12 0050  NA 135 134* 134* 135 130*  K 3.3* 3.2* 3.4* 3.3* 3.3*  CL 104 102 101 100 96  CO2 24 22 24  -- 24  GLUCOSE 113* 125* 154* 196* 200*  BUN 19 21 31* 32* 33*  CREATININE 0.91 1.04 1.39* 1.60* 1.61*  CALCIUM 7.9* 7.8* 8.1* -- 7.9*  MG -- -- -- -- --  PHOS -- -- -- -- --   Liver Function Tests:  Lab 12/10/12 0050  AST 13  ALT 11  ALKPHOS 69  BILITOT 0.6  PROT 6.0  ALBUMIN 2.7*    Lab  12/10/12 0050  LIPASE 13  AMYLASE --   No results found for this basename: AMMONIA:5 in the last 168 hours CBC:  Lab 12/12/12 0435 12/11/12 0458 12/10/12 0516 12/10/12 0101 12/10/12 0050  WBC 8.9 12.1* 18.9* -- 21.2*  NEUTROABS -- 9.8* -- -- 18.9*  HGB 11.4* 11.5* 11.5* 11.6* 12.4*  HCT 33.0* 33.3* 33.8* 34.0* 35.6*  MCV 82.5 83.7 83.3 -- 83.2  PLT 163 158 172 -- 169   Cardiac Enzymes: No results found for this basename: CKTOTAL:5,CKMB:5,CKMBINDEX:5,TROPONINI:5 in the last 168 hours BNP: BNP (last 3 results) No results found for this basename: PROBNP:3 in the last 8760 hours CBG:  Lab 12/12/12 1147 12/12/12 0735 12/11/12 2240 12/11/12 1650 12/11/12 1227  GLUCAP 104* 121* 124* 115* 105*       Signed:  Penny Pia  [facility] 12/12/2012, 4:24 PM

## 2012-12-12 NOTE — Progress Notes (Signed)
Subjective: Patient reports that he is feeling a bit tired, but feels much better. He has had no shakes or chills.  Objective: Vital signs in last 24 hours: Temp:  [98.4 F (36.9 C)-100.4 F (38 C)] 99.2 F (37.3 C) (01/28 0521) Pulse Rate:  [83-92] 84  (01/28 0521) Resp:  [16-20] 18  (01/28 0521) BP: (109-153)/(56-71) 144/71 mmHg (01/28 0521) SpO2:  [93 %-96 %] 95 % (01/28 0521)  Intake/Output from previous day: 01/27 0701 - 01/28 0700 In: 720 [P.O.:720] Out: 1500 [Urine:1500] Intake/Output this shift:    Physical Exam:  Constitutional: Vital signs reviewed. WD WN in NAD    Urine is clear, amber without clots  Lab Results:  Basename 12/12/12 0435 12/11/12 0458 12/10/12 0516  HGB 11.4* 11.5* 11.5*  HCT 33.0* 33.3* 33.8*   BMET  Basename 12/12/12 0435 12/11/12 0458  NA 135 134*  K 3.3* 3.2*  CL 104 102  CO2 24 22  GLUCOSE 113* 125*  BUN 19 21  CREATININE 0.91 1.04  CALCIUM 7.9* 7.8*   No results found for this basename: LABPT:3,INR:3 in the last 72 hours No results found for this basename: LABURIN:1 in the last 72 hours Results for orders placed during the hospital encounter of 12/09/12  URINE CULTURE     Status: Normal   Collection Time   12/10/12 12:10 AM      Component Value Range Status Comment   Specimen Description URINE, CATHETERIZED   Final    Special Requests NONE   Final    Culture  Setup Time 12/10/2012 16:55   Final    Colony Count >=100,000 COLONIES/ML   Final    Culture KLEBSIELLA SPECIES   Final    Report Status 12/12/2012 FINAL   Final    Organism ID, Bacteria KLEBSIELLA SPECIES   Final   CULTURE, BLOOD (ROUTINE X 2)     Status: Normal (Preliminary result)   Collection Time   12/10/12  2:45 AM      Component Value Range Status Comment   Specimen Description BLOOD RIGHT HAND   Final    Special Requests BOTTLES DRAWN AEROBIC AND ANAEROBIC 5CC   Final    Culture  Setup Time 12/10/2012 15:01   Final    Culture     Final    Value:         BLOOD CULTURE RECEIVED NO GROWTH TO DATE CULTURE WILL BE HELD FOR 5 DAYS BEFORE ISSUING A FINAL NEGATIVE REPORT   Report Status PENDING   Incomplete   CULTURE, BLOOD (ROUTINE X 2)     Status: Normal (Preliminary result)   Collection Time   12/10/12  2:50 AM      Component Value Range Status Comment   Specimen Description BLOOD LEFT HAND   Final    Special Requests BOTTLES DRAWN AEROBIC AND ANAEROBIC 5CC   Final    Culture  Setup Time 12/10/2012 15:01   Final    Culture     Final    Value:        BLOOD CULTURE RECEIVED NO GROWTH TO DATE CULTURE WILL BE HELD FOR 5 DAYS BEFORE ISSUING A FINAL NEGATIVE REPORT   Report Status PENDING   Incomplete   SURGICAL PCR SCREEN     Status: Abnormal   Collection Time   12/10/12  6:42 AM      Component Value Range Status Comment   MRSA, PCR NEGATIVE  NEGATIVE Final    Staphylococcus aureus POSITIVE (*) NEGATIVE Final  Studies/Results: No results found.  Assessment/Plan:  1. Pyelonephritis. He had a recent endoscopic intervention of at least his bladder. I do not know the specific procedure that he had done, but he was left with a stent. Stent seems to be draining effectively/appropriately, without evidence of hydronephrosis on the left. Urine culture is growing a fairly sensitive Klebsiella.  2. Adenocarcinoma of the prostate, status post radical retropubic prostatectomy in 1996, with salvage radiotherapy in 1999. PSA is 3.2, and has been on an upward trend over the past few years. He is not on any adjuvant therapy at the present time. He is asymptomatic and by report has had a negative bone scan. CT scan reveals no evidence of osseous metastatic disease.  Plan:  1. At this point, I think he can be switched to an oral medication, and he can go home if he voids  2. I will have catheter removed for voiding trial.  3. I would recommend a follow up with Dr. Gaynelle Arabian at [facility] at his scheduled appointment in  early/mid February  LOS: 3 days   Marcine Matar M 12/12/2012, 7:30 AM

## 2012-12-16 LAB — CULTURE, BLOOD (ROUTINE X 2): Culture: NO GROWTH

## 2012-12-22 DIAGNOSIS — E785 Hyperlipidemia, unspecified: Secondary | ICD-10-CM | POA: Diagnosis not present

## 2012-12-22 DIAGNOSIS — I10 Essential (primary) hypertension: Secondary | ICD-10-CM | POA: Diagnosis not present

## 2012-12-25 DIAGNOSIS — N1330 Unspecified hydronephrosis: Secondary | ICD-10-CM | POA: Diagnosis not present

## 2012-12-25 DIAGNOSIS — C61 Malignant neoplasm of prostate: Secondary | ICD-10-CM | POA: Diagnosis not present

## 2013-01-02 DIAGNOSIS — E785 Hyperlipidemia, unspecified: Secondary | ICD-10-CM

## 2013-01-02 HISTORY — DX: Hyperlipidemia, unspecified: E78.5

## 2013-01-23 DIAGNOSIS — R609 Edema, unspecified: Secondary | ICD-10-CM | POA: Diagnosis not present

## 2013-01-23 DIAGNOSIS — R7309 Other abnormal glucose: Secondary | ICD-10-CM | POA: Diagnosis not present

## 2013-01-23 DIAGNOSIS — N3080 Other cystitis without hematuria: Secondary | ICD-10-CM | POA: Diagnosis not present

## 2013-01-23 DIAGNOSIS — Z87891 Personal history of nicotine dependence: Secondary | ICD-10-CM | POA: Diagnosis not present

## 2013-01-23 DIAGNOSIS — N1330 Unspecified hydronephrosis: Secondary | ICD-10-CM | POA: Diagnosis not present

## 2013-01-23 DIAGNOSIS — Z466 Encounter for fitting and adjustment of urinary device: Secondary | ICD-10-CM | POA: Diagnosis not present

## 2013-01-23 DIAGNOSIS — I10 Essential (primary) hypertension: Secondary | ICD-10-CM | POA: Diagnosis not present

## 2013-02-06 DIAGNOSIS — C61 Malignant neoplasm of prostate: Secondary | ICD-10-CM | POA: Diagnosis not present

## 2013-04-28 ENCOUNTER — Other Ambulatory Visit: Payer: Self-pay | Admitting: Internal Medicine

## 2013-05-01 ENCOUNTER — Other Ambulatory Visit: Payer: Self-pay | Admitting: Internal Medicine

## 2013-05-07 DIAGNOSIS — C61 Malignant neoplasm of prostate: Secondary | ICD-10-CM | POA: Diagnosis not present

## 2013-05-08 DIAGNOSIS — C61 Malignant neoplasm of prostate: Secondary | ICD-10-CM | POA: Diagnosis not present

## 2013-05-22 DIAGNOSIS — I10 Essential (primary) hypertension: Secondary | ICD-10-CM | POA: Diagnosis not present

## 2013-05-22 DIAGNOSIS — N135 Crossing vessel and stricture of ureter without hydronephrosis: Secondary | ICD-10-CM | POA: Diagnosis not present

## 2013-05-22 DIAGNOSIS — Z87891 Personal history of nicotine dependence: Secondary | ICD-10-CM | POA: Diagnosis not present

## 2013-05-22 DIAGNOSIS — C61 Malignant neoplasm of prostate: Secondary | ICD-10-CM | POA: Diagnosis not present

## 2013-05-22 DIAGNOSIS — E669 Obesity, unspecified: Secondary | ICD-10-CM | POA: Diagnosis not present

## 2013-05-22 DIAGNOSIS — C7919 Secondary malignant neoplasm of other urinary organs: Secondary | ICD-10-CM | POA: Diagnosis not present

## 2013-05-22 DIAGNOSIS — Z8546 Personal history of malignant neoplasm of prostate: Secondary | ICD-10-CM | POA: Diagnosis not present

## 2013-05-22 DIAGNOSIS — Z466 Encounter for fitting and adjustment of urinary device: Secondary | ICD-10-CM | POA: Diagnosis not present

## 2013-05-22 DIAGNOSIS — R319 Hematuria, unspecified: Secondary | ICD-10-CM | POA: Diagnosis not present

## 2013-05-22 DIAGNOSIS — Z6835 Body mass index (BMI) 35.0-35.9, adult: Secondary | ICD-10-CM | POA: Diagnosis not present

## 2013-05-22 DIAGNOSIS — N1330 Unspecified hydronephrosis: Secondary | ICD-10-CM | POA: Diagnosis not present

## 2013-05-29 ENCOUNTER — Other Ambulatory Visit: Payer: Self-pay

## 2013-05-31 ENCOUNTER — Other Ambulatory Visit: Payer: Self-pay | Admitting: Geriatric Medicine

## 2013-05-31 MED ORDER — METOPROLOL TARTRATE 50 MG PO TABS: 50.0000 mg | ORAL_TABLET | Freq: Two times a day (BID) | ORAL | Status: DC

## 2013-06-01 ENCOUNTER — Other Ambulatory Visit: Payer: Self-pay | Admitting: Geriatric Medicine

## 2013-06-01 MED ORDER — DOXAZOSIN MESYLATE 4 MG PO TABS: 4.0000 mg | ORAL_TABLET | Freq: Every day | ORAL | Status: DC

## 2013-06-29 ENCOUNTER — Other Ambulatory Visit: Payer: Self-pay | Admitting: *Deleted

## 2013-06-29 ENCOUNTER — Other Ambulatory Visit: Payer: Medicare Other

## 2013-06-29 DIAGNOSIS — E559 Vitamin D deficiency, unspecified: Secondary | ICD-10-CM

## 2013-06-29 DIAGNOSIS — R7989 Other specified abnormal findings of blood chemistry: Secondary | ICD-10-CM

## 2013-06-29 DIAGNOSIS — C61 Malignant neoplasm of prostate: Secondary | ICD-10-CM

## 2013-06-29 DIAGNOSIS — I10 Essential (primary) hypertension: Secondary | ICD-10-CM | POA: Diagnosis not present

## 2013-06-29 DIAGNOSIS — E785 Hyperlipidemia, unspecified: Secondary | ICD-10-CM | POA: Diagnosis not present

## 2013-06-30 LAB — BASIC METABOLIC PANEL
BUN: 13 mg/dL (ref 8–27)
CO2: 22 mmol/L (ref 18–29)
Calcium: 8.7 mg/dL (ref 8.6–10.2)
Chloride: 105 mmol/L (ref 97–108)
GFR calc Af Amer: 94 mL/min/{1.73_m2} (ref >59)
Glucose: 115 mg/dL — ABNORMAL HIGH (ref 65–99)

## 2013-06-30 LAB — LIPID PANEL
Chol/HDL Ratio: 5 ratio units (ref 0.0–5.0)
VLDL Cholesterol Cal: 25 mg/dL (ref 5–40)

## 2013-06-30 LAB — HEMOGLOBIN A1C
Est. average glucose Bld gHb Est-mCnc: 137 mg/dL
Hgb A1c MFr Bld: 6.4 % — ABNORMAL HIGH (ref 4.8–5.6)

## 2013-07-02 ENCOUNTER — Encounter: Payer: Self-pay | Admitting: *Deleted

## 2013-07-03 ENCOUNTER — Ambulatory Visit (INDEPENDENT_AMBULATORY_CARE_PROVIDER_SITE_OTHER): Payer: Medicare Other | Admitting: Internal Medicine

## 2013-07-03 ENCOUNTER — Encounter: Payer: Self-pay | Admitting: Internal Medicine

## 2013-07-03 VITALS — BP 136/88 | HR 67 | Temp 97.5°F | Resp 14 | Ht 71.0 in | Wt 263.0 lb

## 2013-07-03 DIAGNOSIS — R7309 Other abnormal glucose: Secondary | ICD-10-CM

## 2013-07-03 DIAGNOSIS — E785 Hyperlipidemia, unspecified: Secondary | ICD-10-CM

## 2013-07-03 DIAGNOSIS — L989 Disorder of the skin and subcutaneous tissue, unspecified: Secondary | ICD-10-CM | POA: Insufficient documentation

## 2013-07-03 DIAGNOSIS — M1990 Unspecified osteoarthritis, unspecified site: Secondary | ICD-10-CM

## 2013-07-03 DIAGNOSIS — I10 Essential (primary) hypertension: Secondary | ICD-10-CM | POA: Insufficient documentation

## 2013-07-03 DIAGNOSIS — M899 Disorder of bone, unspecified: Secondary | ICD-10-CM | POA: Insufficient documentation

## 2013-07-03 DIAGNOSIS — E876 Hypokalemia: Secondary | ICD-10-CM | POA: Diagnosis not present

## 2013-07-03 DIAGNOSIS — R7303 Prediabetes: Secondary | ICD-10-CM | POA: Insufficient documentation

## 2013-07-03 DIAGNOSIS — C61 Malignant neoplasm of prostate: Secondary | ICD-10-CM | POA: Insufficient documentation

## 2013-07-03 MED ORDER — FISH OIL 1000 MG PO CAPS: 1.0000 | ORAL_CAPSULE | Freq: Every day | ORAL | Status: DC

## 2013-07-03 MED ORDER — MELOXICAM 15 MG PO TABS: 15.0000 mg | ORAL_TABLET | Freq: Every day | ORAL | Status: DC

## 2013-07-03 NOTE — Progress Notes (Signed)
Patient ID: Jeremiah Collins, male   DOB: 1934-01-10, 79 y.o.   MRN: [id number]  Chief Complaint  Patient presents with  . Medical Managment of Chronic Issues   No Known Allergies  HPI  79 y/o male patient is here for routine follow up visit  His joint pain in his knee, neck, back and hands are bothering him. He denies any redness and swelling but pain is bad and is 8-9/10.  He wakes up with the pain. Denies any stiffness. Tylenol extra strength helps some  He wants me to take a look behind his right ear. He thinks something has come up in his ear. Denies any pain or itching or drainage but feels a bump behind his ear  No further UTI  He follows with Dr Normand Sloop for his prostate cancer and  is s/p radical prostatectomy 1996, salvage radiation for biochemical relapse in 1999 and increase in PSA in 2013 with a penile mass. He underwent cystoscopy and excisional bx of penile nodule and bladder neck biopsy and nodule was consistent with prostate cancer. Post-op PSA done 08/08/12 was 1.75. He received lupron 2/14 and underwent cystourethroscopy and left ureteral stent change on 01/23/13. To follow with urology and hemeonc at [facility]. Reviewed the notes. Is taking doxazosin   BP is controlled in the office. He mentions SBP being in 140-150 range at home and DBP being normal.denies any headache , blurry vision, chest pain associated with high bp readings. He does not have his BP machine with him  Review of Systems  Constitutional: Negative for fever, chills and diaphoresis.  HENT: Negative for congestion.   Eyes: Negative for blurred vision.  Respiratory: Positive for shortness of breath. Negative for cough.        With moderate exertion. He has obesity. Has sedentary lifestyle  Cardiovascular: Positive for leg swelling. Negative for chest pain and palpitations.  Gastrointestinal: Negative for heartburn and abdominal pain.  Genitourinary: Positive for frequency. Negative for dysuria, hematuria  and flank pain.  Musculoskeletal: Positive for joint pain. Negative for falls.  Skin: Negative for itching and rash.  Neurological: Negative for dizziness, loss of consciousness, weakness and headaches.  Psychiatric/Behavioral: Negative for depression and memory loss. The patient does not have insomnia.     Past Medical History  Diagnosis Date  . Prostate cancer   . HTN (hypertension)   . Other and unspecified hyperlipidemia 01/02/2013  . Routine general medical examination at a health care facility   . Acute bronchitis 09/12/2012  . Cellulitis and abscess of leg, except foot 01/17/2012  . Gross hematuria 09/06/2011  . Osteoarthrosis involving, or with mention of more than one site, but not specified as generalized, multiple sites 10/22/2010  . Reflux esophagitis 05/10/2008  . First degree atrioventricular block 04/25/2007  . 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 Palpitations 04/28/2004  . Chest pain, unspecified 04/28/2004  . Closed fracture of five ribs 03/24/2004  . Spinal stenosis, unspecified region other than cervical 04/29/1995  . Other abnormal blood chemistry 04/29/1991  . Cataract     Dr.Groat    BP 136/88  Pulse 67  Temp(Src) 97.5 F (36.4 C) (Oral)  Resp 14  Ht 5\' 11"  (1.803 m)  Wt 263 lb (119.296 kg)  BMI 36.7 kg/m2  Physical Exam  Constitutional: He is oriented to person, place, and time. He appears well-developed and well-nourished. No distress.  obese  HENT:  Head: Normocephalic and atraumatic.  Mouth/Throat: Oropharynx is clear and moist. No oropharyngeal exudate.  Eyes: Conjunctivae are  normal. Pupils are equal, round, and reactive to light.  Neck: Normal range of motion. Neck supple. No JVD present.  Cardiovascular: Normal rate, regular rhythm and normal heart sounds.   No murmur heard. Pulmonary/Chest: Effort normal and breath sounds normal. No respiratory distress. He has no wheezes.  Abdominal: Soft. Bowel sounds are normal.  Musculoskeletal: He  exhibits edema. He exhibits no tenderness.  Limited ROM in both shoulders, crepitus present, crepitus in both knees as well, obese  Lymphadenopathy:    He has no cervical adenopathy.  Neurological: He is alert and oriented to person, place, and time. No cranial nerve deficit.  Skin: Skin is warm and dry. He is not diaphoretic. No erythema. No pallor.  Has a new 0.5 mm skin lesion behind right ear with raised surface and irregular border and appears keratotic, no drainage  Psychiatric: He has a normal mood and affect. His behavior is normal. Thought content normal.     Labs  CBC    Component Value Date/Time   WBC 8.9 12/12/2012 0435   RBC 4.00* 12/12/2012 0435   HGB 11.4* 12/12/2012 0435   HCT 33.0* 12/12/2012 0435   PLT 163 12/12/2012 0435   MCV 82.5 12/12/2012 0435   MCH 28.5 12/12/2012 0435   MCHC 34.5 12/12/2012 0435   RDW 13.7 12/12/2012 0435   LYMPHSABS 1.1 12/11/2012 0458   MONOABS 1.2* 12/11/2012 0458   EOSABS 0.0 12/11/2012 0458   BASOSABS 0.0 12/11/2012 0458    CMP     Component Value Date/Time   NA 141 06/29/2013 0942   NA 135 12/12/2012 0435   K 4.4 06/29/2013 0942   CL 105 06/29/2013 0942   CO2 22 06/29/2013 0942   GLUCOSE 115* 06/29/2013 0942   GLUCOSE 113* 12/12/2012 0435   BUN 13 06/29/2013 0942   BUN 19 12/12/2012 0435   CREATININE 0.91 06/29/2013 0942   CALCIUM 8.7 06/29/2013 0942   PROT 6.0 12/10/2012 0050   ALBUMIN 2.7* 12/10/2012 0050   AST 13 12/10/2012 0050   ALT 11 12/10/2012 0050   ALKPHOS 69 12/10/2012 0050   BILITOT 0.6 12/10/2012 0050   GFRNONAA 82 06/29/2013 0942   GFRAA 94 06/29/2013 0942   06/29/13  Lipid panel- t.chol 159, tg 127, hdl 32, ldl 102 Bmp- glu 115, bun 13, cr 0.91, na 141, k 4.4, ca 8.7 a1c 6.4   Assessment/plan  HTN- continue metoprolol for now. Record home BP reading and to bring bp machine to office. Will review reading then and adjust bp medication if needed.   Prediabetes- a1c 6.4. Pt is overweight. Will need to modify diet, exercise (his  OA limits this). Will need to lose weight. Recheck a1c in 4 months  Osteoarthritis- will have him on meloxicam for now and reassess in a month. Encouraged exercise as tolerated  Hyperlipidemia- low HDL. Will have him take fish oil daily for now  Hypokalemia- normal potassium level. Will monitor periodically  Skin lesion- will refer to dermatology with concerns for malignancy given its appearance and need for skin biopsy. He is mostly out in the sun and this is a new lesion.  Prostate cancer- follows with Dr. Normand Sloop. He is s/p radical prostatectomy 1996, salvage radiation for biochemical relapse in 1999 and increase in PSA in 2013 with a penile mass. He underwent cystoscopy and excisional bx of penile nodule and bladder neck biopsy and nodule was consistent with prostate cancer. Post-op PSA done 08/08/12 was 1.75. He received lupron 2/14 and underwent cystourethroscopy and  left ureteral stent change on 01/23/13. To follow with urology and hemeonc at [facility]. Reviewed the notes. Continue doxazosin

## 2013-07-13 ENCOUNTER — Other Ambulatory Visit: Payer: Medicare Other

## 2013-07-13 DIAGNOSIS — I10 Essential (primary) hypertension: Secondary | ICD-10-CM | POA: Diagnosis not present

## 2013-07-13 DIAGNOSIS — R7303 Prediabetes: Secondary | ICD-10-CM

## 2013-07-13 DIAGNOSIS — R7309 Other abnormal glucose: Secondary | ICD-10-CM | POA: Diagnosis not present

## 2013-07-13 DIAGNOSIS — E785 Hyperlipidemia, unspecified: Secondary | ICD-10-CM | POA: Diagnosis not present

## 2013-07-14 LAB — BASIC METABOLIC PANEL
BUN/Creatinine Ratio: 20 (ref 10–22)
Chloride: 103 mmol/L (ref 97–108)
GFR calc Af Amer: 88 mL/min/{1.73_m2} (ref >59)
GFR calc non Af Amer: 76 mL/min/{1.73_m2} (ref >59)
Glucose: 94 mg/dL (ref 65–99)
Potassium: 4.7 mmol/L (ref 3.5–5.2)
Sodium: 143 mmol/L (ref 134–144)

## 2013-07-18 ENCOUNTER — Ambulatory Visit (INDEPENDENT_AMBULATORY_CARE_PROVIDER_SITE_OTHER): Payer: Medicare Other | Admitting: Internal Medicine

## 2013-07-18 ENCOUNTER — Encounter: Payer: Self-pay | Admitting: Internal Medicine

## 2013-07-18 VITALS — BP 140/90 | HR 62 | Temp 97.9°F | Resp 18 | Ht 71.0 in | Wt 267.2 lb

## 2013-07-18 DIAGNOSIS — I10 Essential (primary) hypertension: Secondary | ICD-10-CM

## 2013-07-18 DIAGNOSIS — R7303 Prediabetes: Secondary | ICD-10-CM

## 2013-07-18 DIAGNOSIS — Z23 Encounter for immunization: Secondary | ICD-10-CM

## 2013-07-18 DIAGNOSIS — M1990 Unspecified osteoarthritis, unspecified site: Secondary | ICD-10-CM | POA: Diagnosis not present

## 2013-07-18 DIAGNOSIS — E559 Vitamin D deficiency, unspecified: Secondary | ICD-10-CM | POA: Diagnosis not present

## 2013-07-18 DIAGNOSIS — R7309 Other abnormal glucose: Secondary | ICD-10-CM | POA: Diagnosis not present

## 2013-07-18 MED ORDER — CHOLECALCIFEROL 10 MCG (400 UNIT) PO TABS: 400.0000 [IU] | ORAL_TABLET | Freq: Every day | ORAL | Status: DC

## 2013-07-18 MED ORDER — LISINOPRIL-HYDROCHLOROTHIAZIDE 10-12.5 MG PO TABS: 1.0000 | ORAL_TABLET | Freq: Every day | ORAL | Status: DC

## 2013-07-18 NOTE — Progress Notes (Signed)
Patient ID: Jeremiah Collins, male   DOB: 21-Nov-1933, 79 y.o.   MRN: [id number]  Chief Complaint  Patient presents with  . Follow-up   No Known Allergies  HPI  79 y/o male patient is here for routine follow up visit on his osteoarthritis and hypertension.  The meloxicam has been helpful for his joint and his pain has come down to 2-3/10 compared to 8-9/10. Has not required to take tylenol  He has been taking fish oil once a day. He is taking vitamin d 400 iu once a day  No falls reported  His appointment with dermatology is pending  His BP is elevated this office visit. Reviewed home readings. SBP 148-168 with one reading of 181 and DBP 77-91. Denies any headache, chest pain, abdominal pain or shortness of breath. Recheck in office is 182/90 and with his home machine is 186/100. On review of old records, he had been on cardura and tarka in the pasdt which was taken off due to low bp readings while in the hospital in jan 2014  Of note--- He follows with Dr Jeremiah Collins for his prostate cancer and  is s/p radical prostatectomy 1996, salvage radiation for biochemical relapse in 1999 and increase in PSA in 2013 with a penile mass. He underwent cystoscopy and excisional bx of penile nodule and bladder neck biopsy and nodule was consistent with prostate cancer. Post-op PSA done 08/08/12 was 1.75. He received lupron 2/14 and underwent cystourethroscopy and left ureteral stent change on 01/23/13. To follow with urology and hemeonc at [facility]. taking doxazosin   Review of Systems  Constitutional: Negative for fever, chills and diaphoresis.  HENT: Negative for congestion.   Eyes: Negative for blurred vision.  Cardiovascular: Positive for leg swelling. Negative for chest pain and palpitations.  Respiratory: no shortness of breath Gastrointestinal: Negative for heartburn and abdominal pain.  Genitourinary: Positive for frequency. Negative for dysuria, hematuria and flank pain.  Musculoskeletal:  Positive for joint pain. Negative for falls.  Skin: Negative for itching and rash.  Neurological: Negative for dizziness, loss of consciousness, weakness and headaches.  Psychiatric/Behavioral: Negative for depression and memory loss. The patient does not have insomnia.     Past Medical History  Diagnosis Date  . Prostate cancer   . HTN (hypertension)   . Other and unspecified hyperlipidemia 01/02/2013  . Routine general medical examination at a health care facility   . Acute bronchitis 09/12/2012  . Cellulitis and abscess of leg, except foot 01/17/2012  . Gross hematuria 09/06/2011  . Osteoarthrosis involving, or with mention of more than one site, but not specified as generalized, multiple sites 10/22/2010  . Reflux esophagitis 05/10/2008  . First degree atrioventricular block 04/25/2007  . 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 Palpitations 04/28/2004  . Chest pain, unspecified 04/28/2004  . Closed fracture of five ribs 03/24/2004  . Spinal stenosis, unspecified region other than cervical 04/29/1995  . Other abnormal blood chemistry 04/29/1991  . Cataract     Dr.Groat    Past Surgical History  Procedure Laterality Date  . Left ureteral stent placement  Week of 12/05/11  . Prostatectomy  1996    Dr. Earlene Collins    Current Outpatient Prescriptions on File Prior to Visit  Medication Sig Dispense Refill  . doxazosin (CARDURA) 4 MG tablet Take 1 tablet (4 mg total) by mouth daily.  30 tablet  3  . meloxicam (MOBIC) 15 MG tablet Take 1 tablet (15 mg total) by mouth daily.  30 tablet  3  .  metoprolol (LOPRESSOR) 50 MG tablet Take 1 tablet (50 mg total) by mouth 2 (two) times daily.  60 tablet  3  . Omega-3 Fatty Acids (FISH OIL) 1000 MG CAPS Take 1 capsule (1,000 mg total) by mouth daily.  90 capsule  3  . acetaminophen (TYLENOL) 500 MG tablet 1,000 mg. Take 1,000 mg by mouth every 6 (six) hours as needed.       No current facility-administered medications on file prior to visit.    BP  140/90  Pulse 62  Temp(Src) 97.9 F (36.6 C) (Oral)  Resp 18  Ht 5\' 11"  (1.803 m)  Wt 267 lb 3.2 oz (121.201 kg)  BMI 37.28 kg/m2  SpO2 97%  Physical Exam  Constitutional: He is oriented to person, place, and time. He appears well-developed and well-nourished. No distress.  obese  HENT:   Head: Normocephalic and atraumatic.   Mouth/Throat: Oropharynx is clear and moist. No oropharyngeal exudate.  Eyes: Conjunctivae are normal. Pupils are equal, round, and reactive to light.  Neck: Normal range of motion. Neck supple. No JVD present.  Cardiovascular: Normal rate, regular rhythm and normal heart sounds.    No murmur heard. 1+ edema bilaterally Pulmonary/Chest: Effort normal and breath sounds normal. No respiratory distress. He has no wheezes.  Abdominal: Soft. Bowel sounds are normal.  Musculoskeletal: He exhibits edema. He exhibits no tenderness.  Limited ROM in both shoulders, crepitus present, crepitus in both knees as well, obese  Lymphadenopathy:    He has no cervical adenopathy.  Neurological: He is alert and oriented to person, place, and time. No cranial nerve deficit.  Skin: Skin is warm and dry. He is not diaphoretic. No erythema. No pallor.  Has a 0.5 mm skin lesion behind right ear with raised surface and irregular border and appears keratotic, no drainage  Psychiatric: He has a normal mood and affect. His behavior is normal. Thought content normal.   Labs reviewed  06/29/13  Lipid panel- t.chol 159, tg 127, hdl 32, ldl 102 Bmp- glu 115, bun 13, cr 0.91, na 141, k 4.4, ca 8.7 a1c 6.4   Assessment/plan  Osteoarthritis- tolerating meloxicam well, continue this for now. Weight reduction will benefit him as well. Increase vitamin d to 400 iu bid for now  Uncontrolled HTN- will add lisinopril-hctz 10-12.5 mg daily for now.continue metoprolol 50 mg bid. Monitor bp at home. Reassess in 8 weeks. Warning signs with elevated bp explained. Also continue his doxazosin. Check  cmp prior to next visit   Prediabetes- a1c 6.4. Pt is overweight. Diet and exercise  Hyperlipidemia- continue fish oil daily for now

## 2013-07-25 DIAGNOSIS — D1801 Hemangioma of skin and subcutaneous tissue: Secondary | ICD-10-CM | POA: Diagnosis not present

## 2013-07-25 DIAGNOSIS — D485 Neoplasm of uncertain behavior of skin: Secondary | ICD-10-CM | POA: Diagnosis not present

## 2013-07-25 DIAGNOSIS — L723 Sebaceous cyst: Secondary | ICD-10-CM | POA: Diagnosis not present

## 2013-08-10 DIAGNOSIS — I498 Other specified cardiac arrhythmias: Secondary | ICD-10-CM | POA: Diagnosis not present

## 2013-08-10 DIAGNOSIS — Z01818 Encounter for other preprocedural examination: Secondary | ICD-10-CM | POA: Diagnosis not present

## 2013-08-10 DIAGNOSIS — I10 Essential (primary) hypertension: Secondary | ICD-10-CM | POA: Diagnosis not present

## 2013-08-13 DIAGNOSIS — N1330 Unspecified hydronephrosis: Secondary | ICD-10-CM | POA: Diagnosis not present

## 2013-08-13 DIAGNOSIS — C61 Malignant neoplasm of prostate: Secondary | ICD-10-CM | POA: Diagnosis not present

## 2013-08-21 DIAGNOSIS — I10 Essential (primary) hypertension: Secondary | ICD-10-CM | POA: Diagnosis not present

## 2013-08-21 DIAGNOSIS — Z4689 Encounter for fitting and adjustment of other specified devices: Secondary | ICD-10-CM | POA: Diagnosis not present

## 2013-08-21 DIAGNOSIS — C61 Malignant neoplasm of prostate: Secondary | ICD-10-CM | POA: Diagnosis not present

## 2013-08-21 DIAGNOSIS — Z466 Encounter for fitting and adjustment of urinary device: Secondary | ICD-10-CM | POA: Diagnosis not present

## 2013-08-21 DIAGNOSIS — R319 Hematuria, unspecified: Secondary | ICD-10-CM | POA: Diagnosis not present

## 2013-08-21 DIAGNOSIS — Z87891 Personal history of nicotine dependence: Secondary | ICD-10-CM | POA: Diagnosis not present

## 2013-08-21 DIAGNOSIS — N1330 Unspecified hydronephrosis: Secondary | ICD-10-CM | POA: Diagnosis not present

## 2013-08-21 DIAGNOSIS — Z8551 Personal history of malignant neoplasm of bladder: Secondary | ICD-10-CM | POA: Diagnosis not present

## 2013-08-21 DIAGNOSIS — N320 Bladder-neck obstruction: Secondary | ICD-10-CM | POA: Diagnosis not present

## 2013-09-05 DIAGNOSIS — C61 Malignant neoplasm of prostate: Secondary | ICD-10-CM | POA: Diagnosis not present

## 2013-09-14 ENCOUNTER — Other Ambulatory Visit: Payer: Medicare Other

## 2013-09-14 DIAGNOSIS — I10 Essential (primary) hypertension: Secondary | ICD-10-CM

## 2013-09-15 LAB — COMPREHENSIVE METABOLIC PANEL
ALT: 11 IU/L (ref 0–44)
Albumin/Globulin Ratio: 1.8 (ref 1.1–2.5)
Alkaline Phosphatase: 96 IU/L (ref 39–117)
BUN/Creatinine Ratio: 23 — ABNORMAL HIGH (ref 10–22)
Chloride: 102 mmol/L (ref 97–108)
GFR calc Af Amer: 85 mL/min/{1.73_m2} (ref >59)
GFR calc non Af Amer: 74 mL/min/{1.73_m2} (ref >59)
Potassium: 4.6 mmol/L (ref 3.5–5.2)
Sodium: 141 mmol/L (ref 134–144)
Total Bilirubin: 0.3 mg/dL (ref 0.0–1.2)

## 2013-09-18 ENCOUNTER — Encounter: Payer: Self-pay | Admitting: Internal Medicine

## 2013-09-18 ENCOUNTER — Ambulatory Visit (INDEPENDENT_AMBULATORY_CARE_PROVIDER_SITE_OTHER): Payer: Medicare Other | Admitting: Internal Medicine

## 2013-09-18 VITALS — BP 150/84 | HR 57 | Temp 97.8°F | Wt 263.8 lb

## 2013-09-18 DIAGNOSIS — M899 Disorder of bone, unspecified: Secondary | ICD-10-CM | POA: Diagnosis not present

## 2013-09-18 DIAGNOSIS — R7303 Prediabetes: Secondary | ICD-10-CM

## 2013-09-18 DIAGNOSIS — I10 Essential (primary) hypertension: Secondary | ICD-10-CM | POA: Diagnosis not present

## 2013-09-18 DIAGNOSIS — E785 Hyperlipidemia, unspecified: Secondary | ICD-10-CM

## 2013-09-18 DIAGNOSIS — R7309 Other abnormal glucose: Secondary | ICD-10-CM

## 2013-09-18 MED ORDER — TETANUS-DIPHTHERIA TOXOIDS TD 2-2 LF/0.5ML IM SUSP: 0.5000 mL | Freq: Once | INTRAMUSCULAR | Status: DC

## 2013-09-18 MED ORDER — LISINOPRIL-HYDROCHLOROTHIAZIDE 20-25 MG PO TABS: 1.0000 | ORAL_TABLET | Freq: Every day | ORAL | Status: DC

## 2013-09-18 NOTE — Progress Notes (Signed)
Patient ID: Jeremiah Collins, male   DOB: 05-28-1934, 79 y.o.   MRN: [id number]  Chief Complaint  Patient presents with  . Medical Managment of Chronic Issues    8 week f/u with labs printed  . Immunizations    needs Tdap   No Known Allergies  HPI 79 y/o male patient is here for his follow up. His bp medication was adjusted last visit and he has been complaint with his medication SBP between 140-160 at home and DBP 70-80. No headache No blurry vision No chest pain Joint pain is under better control with meloxicam Taking his cardura and self catheterizes himself occassionanlly. Follows with dr Earlene Plater and last seen 3 weeks back. Sees him next to get his stent changed in january  Of note--- He follows with Dr Normand Sloop for his prostate cancer and  is s/p radical prostatectomy 1996, salvage radiation for biochemical relapse in 1999 and increase in PSA in 2013 with a penile mass. He underwent cystoscopy and excisional bx of penile nodule and bladder neck biopsy and nodule was consistent with prostate cancer. Post-op PSA done 08/08/12 was 1.75. He received lupron 2/14 and underwent cystourethroscopy and left ureteral stent change on 01/23/13. To follow with urology and hemeonc at [facility]. taking doxazosin   Review of Systems   Constitutional: Negative for fever, chills and diaphoresis.   HENT: Negative for congestion.    Eyes: Negative for blurred vision.   Cardiovascular: Positive for leg swelling. Negative for chest pain and palpitations.  Respiratory: no shortness of breath Gastrointestinal: Negative for heartburn and abdominal pain.   Genitourinary: Positive for frequency. Negative for dysuria, hematuria and flank pain.  Musculoskeletal: Positive for joint pain. Negative for falls.   Skin: Negative for itching and rash.   Neurological: Negative for dizziness, loss of consciousness, weakness and headaches.   Psychiatric/Behavioral: Negative for depression and memory loss. The patient  does not have insomnia.   Medication reviewed. See MAR   Physical Exam   BP 150/84  Pulse 57  Temp(Src) 97.8 F (36.6 C) (Oral)  Wt 263 lb 12.8 oz (119.659 kg)  SpO2 97%  Constitutional: He is oriented to person, place, and time. He appears well-developed and well-nourished. No distress.  obese   HENT:   Head: Normocephalic and atraumatic.   Mouth/Throat: Oropharynx is clear and moist. No oropharyngeal exudate.   Eyes: Conjunctivae are normal. Pupils are equal, round, and reactive to light.   Neck: Normal range of motion. Neck supple. No JVD present.   Cardiovascular: Normal rate, regular rhythm and normal heart sounds.    No murmur heard. 1+ edema bilaterally Pulmonary/Chest: Effort normal and breath sounds normal. No respiratory distress. He has no wheezes.   Abdominal: Soft. Bowel sounds are normal.  Musculoskeletal: He exhibits edema. He exhibits no tenderness.  Limited ROM in both shoulders, crepitus present, crepitus in both knees as well, obese  Lymphadenopathy:    He has no cervical adenopathy.  Neurological: He is alert and oriented to person, place, and time. No cranial nerve deficit.   Psychiatric: He has a normal mood and affect. His behavior is normal. Thought content normal.   Assessment/plan  Uncontrolled HTN- will increase lisinopril-hctz to 20-25 mg daily for now.continue metoprolol 50 mg bid. Monitor bp at home. Reassess in 6 weeks. Warning signs with elevated bp explained. Reviewed bmp. Cut down on salt intake and fried food. To bring his bp machine next office visit  Osteoarthritis- tolerating meloxicam well, continue this for now. Weight  reduction will benefit him as well. continue vitamin d to 400 iu bid for now and prn tylenol  Hyperlipidemia- continue fish oil daily for now  Prediabetes- monitor a1c prior to next visit. Weight reduction, dietary counselling provided

## 2013-09-18 NOTE — Patient Instructions (Signed)
Stop taking old lisinopril-hctz dose and pick up your new prescription and take it once a day  Also take your metoprolol twice a day

## 2013-09-28 ENCOUNTER — Other Ambulatory Visit: Payer: Self-pay | Admitting: Nurse Practitioner

## 2013-10-22 ENCOUNTER — Other Ambulatory Visit: Payer: Medicare Other

## 2013-10-24 ENCOUNTER — Ambulatory Visit: Payer: Medicare Other | Admitting: Internal Medicine

## 2013-10-28 ENCOUNTER — Other Ambulatory Visit: Payer: Self-pay | Admitting: Internal Medicine

## 2013-11-02 ENCOUNTER — Other Ambulatory Visit: Payer: Medicare Other

## 2013-11-02 DIAGNOSIS — R7309 Other abnormal glucose: Secondary | ICD-10-CM | POA: Diagnosis not present

## 2013-11-02 DIAGNOSIS — R7303 Prediabetes: Secondary | ICD-10-CM

## 2013-11-03 LAB — HEMOGLOBIN A1C: Hgb A1c MFr Bld: 6.7 % — ABNORMAL HIGH (ref 4.8–5.6)

## 2013-11-06 ENCOUNTER — Encounter: Payer: Self-pay | Admitting: Internal Medicine

## 2013-11-06 ENCOUNTER — Ambulatory Visit (INDEPENDENT_AMBULATORY_CARE_PROVIDER_SITE_OTHER): Payer: Medicare Other | Admitting: Internal Medicine

## 2013-11-06 ENCOUNTER — Other Ambulatory Visit: Payer: Self-pay | Admitting: *Deleted

## 2013-11-06 VITALS — BP 131/71 | HR 54 | Temp 97.7°F | Resp 16 | Wt 263.0 lb

## 2013-11-06 DIAGNOSIS — E669 Obesity, unspecified: Secondary | ICD-10-CM

## 2013-11-06 DIAGNOSIS — I10 Essential (primary) hypertension: Secondary | ICD-10-CM | POA: Diagnosis not present

## 2013-11-06 DIAGNOSIS — I498 Other specified cardiac arrhythmias: Secondary | ICD-10-CM

## 2013-11-06 DIAGNOSIS — E1169 Type 2 diabetes mellitus with other specified complication: Secondary | ICD-10-CM | POA: Insufficient documentation

## 2013-11-06 DIAGNOSIS — E119 Type 2 diabetes mellitus without complications: Secondary | ICD-10-CM | POA: Diagnosis not present

## 2013-11-06 DIAGNOSIS — R001 Bradycardia, unspecified: Secondary | ICD-10-CM | POA: Insufficient documentation

## 2013-11-06 MED ORDER — LISINOPRIL-HYDROCHLOROTHIAZIDE 20-25 MG PO TABS: 1.0000 | ORAL_TABLET | Freq: Every day | ORAL | Status: DC

## 2013-11-06 MED ORDER — GLUCOSE BLOOD VI STRP: ORAL_STRIP | Status: DC

## 2013-11-06 MED ORDER — ONETOUCH DELICA LANCETS 33G MISC: Status: DC

## 2013-11-06 MED ORDER — AMLODIPINE BESYLATE 5 MG PO TABS: 5.0000 mg | ORAL_TABLET | Freq: Every day | ORAL | Status: DC

## 2013-11-06 MED ORDER — METOPROLOL TARTRATE 25 MG PO TABS: 25.0000 mg | ORAL_TABLET | Freq: Two times a day (BID) | ORAL | Status: DC

## 2013-11-06 MED ORDER — METFORMIN HCL 500 MG PO TABS: 500.0000 mg | ORAL_TABLET | Freq: Every day | ORAL | Status: DC

## 2013-11-06 NOTE — Addendum Note (Signed)
Addended by: Waymond Cera on: 11/06/2013 04:07 PM   Modules accepted: Orders

## 2013-11-06 NOTE — Patient Instructions (Signed)
Your lopressor dose has been decreased. New blood pressure medication has been added. Please take this once a day- amlodipine You have also been started on new medication for diabetes. Check your blood sugar twice a week and bring the reading on your next office visit

## 2013-11-06 NOTE — Progress Notes (Signed)
Patient ID: Jeremiah Collins, male   DOB: 1934-06-03, 79 y.o.   MRN: [id number]     No Known Allergies  Chief Complaint  Patient presents with  . Medical Managment of Chronic Issues    blood pressure   HPI 79 y/o male patient here for follow up on his blood pressure. His bp has been running high and his bp med was adjusted last visit. His bp continues to remain elevated mostly during the day time. Denies any chest pain, shortness of breath, abdominal pain or headache with this. Has been complaint with his medication On review of home bp reading his SBP has been between 150-170 and DBP less than 90.  His heart rate has been running below 60 He complaints of being tired easily Taking his cardura and self catheterizes himself occassionanlly. Follows with dr Earlene Plater and last seen 3 weeks back. Sees him next to get his stent changed in january  Review of Systems   Constitutional: Negative for fever, chills and diaphoresis.   HENT: Negative for congestion.    Eyes: Negative for blurred vision.   Cardiovascular: Negative for chest pain and palpitations.  Respiratory: no shortness of breath Gastrointestinal: Negative for heartburn and abdominal pain.   Genitourinary: Negative for dysuria, hematuria and flank pain.  Musculoskeletal: Positive for joint pain. Negative for falls.   Skin: Negative for itching and rash.   Neurological: Negative for dizziness, loss of consciousness, weakness and headaches.   Psychiatric/Behavioral: Negative for depression and memory loss. The patient does not have insomnia.    Medication reviewed. See [facility]  Past Medical History  Diagnosis Date  . Prostate cancer   . HTN (hypertension)   . Other and unspecified hyperlipidemia 01/02/2013  . Routine general medical examination at a health care facility   . Acute bronchitis 09/12/2012  . Cellulitis and abscess of leg, except foot 01/17/2012  . Gross hematuria 09/06/2011  . Osteoarthrosis involving, or with mention  of more than one site, but not specified as generalized, multiple sites 10/22/2010  . Reflux esophagitis 05/10/2008  . First degree atrioventricular block 04/25/2007  . 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 Palpitations 04/28/2004  . Chest pain, unspecified 04/28/2004  . Closed fracture of five ribs 03/24/2004  . Spinal stenosis, unspecified region other than cervical 04/29/1995  . Other abnormal blood chemistry 04/29/1991  . Cataract     Dr.Groat    Physical exam BP 138/76  Pulse 54  Temp(Src) 97.7 F (36.5 C) (Oral)  Resp 16  Wt 263 lb (119.296 kg)  SpO2 98%  Constitutional: He is oriented to person, place, and time. He appears well-developed and well-nourished. No distress. obese   Cardiovascular: Normal rate, regular rhythm and normal heart sounds.    No murmur heard. 1+ edema bilaterally Pulmonary/Chest: Effort normal and breath sounds normal. No respiratory distress. He has no wheezes.   Abdominal: Soft. Bowel sounds are normal.  Musculoskeletal:  He exhibits no tenderness. Limited ROM in both shoulders, crepitus present, crepitus in both knees as well, obese  Lymphadenopathy:    He has no cervical adenopathy.  Neurological: He is alert and oriented to person, place, and time. No cranial nerve deficit.   Psychiatric: He has a normal mood and affect. His behavior is normal. Thought content normal.    Labs- Lab Results  Component Value Date   HGBA1C 6.7* 11/02/2013   CMP     Component Value Date/Time   NA 141 09/14/2013 0825   NA 135 12/12/2012 0435   K  4.6 09/14/2013 0825   CL 102 09/14/2013 0825   CO2 23 09/14/2013 0825   GLUCOSE 117* 09/14/2013 0825   GLUCOSE 113* 12/12/2012 0435   BUN 23 09/14/2013 0825   BUN 19 12/12/2012 0435   CREATININE 0.99 09/14/2013 0825   CALCIUM 8.9 09/14/2013 0825   PROT 5.9* 09/14/2013 0825   PROT 6.0 12/10/2012 0050   ALBUMIN 2.7* 12/10/2012 0050   AST 11 09/14/2013 0825   ALT 11 09/14/2013 0825   ALKPHOS 96 09/14/2013 0825   BILITOT  0.3 09/14/2013 0825   GFRNONAA 74 09/14/2013 0825   GFRAA 85 09/14/2013 0825   Assessment/plan  1. Uncontrolled hypertension Will add amlodipine 5 mg daily and continue lisinopril-hctz 20-25 mg daily. Will decrease lopressor to 25 mg bid given his slow heart rate  2. Diabetes mellitus type 2 in obese Reviewed a1c. Diabetic teaching with education about glucometer and fingersticks test provided. Calorie count education provided. Will have him started on metformin 500 mg daily and monitor cbg twice a week  3. Bradycardia Will decrease dose of lopressor to 25 mg bid given slow heart rate with malaise

## 2013-11-07 ENCOUNTER — Telehealth: Payer: Self-pay

## 2013-11-07 ENCOUNTER — Ambulatory Visit: Payer: Medicare Other | Admitting: Internal Medicine

## 2013-11-07 MED ORDER — TETANUS-DIPHTHERIA TOXOIDS TD 2-2 LF/0.5ML IM SUSP: 0.5000 mL | Freq: Once | INTRAMUSCULAR | Status: DC

## 2013-11-07 NOTE — Telephone Encounter (Signed)
After calling [facility] several times and unable to get through (line busy), I called patient and informed him I will continue to contact [facility], patient then requested that I submit RX to the [facility] on [address]. RX sent

## 2013-11-07 NOTE — Telephone Encounter (Signed)
Message left on triage voicemail "Please call Jeremiah Collins" no additional information.  I called patient and he stated that he went to [facility] yesterday and they have no record or receiving Tetanus vaccine. I reviewed record and rx was sent over on 09/18/2013, receipt was confirmed. Patient informed I will contact the pharmacy at 9:00 am (opening time) and follow-up with him

## 2013-11-09 IMAGING — CR DG CHEST 1V PORT
1 series · 1 of 1 positions shown · non-contrast
Comparison: Chest radiograph performed 09/25/2012

CLINICAL DATA: Fever, vomiting and weakness.

PORTABLE CHEST - 1 VIEW

[AP]
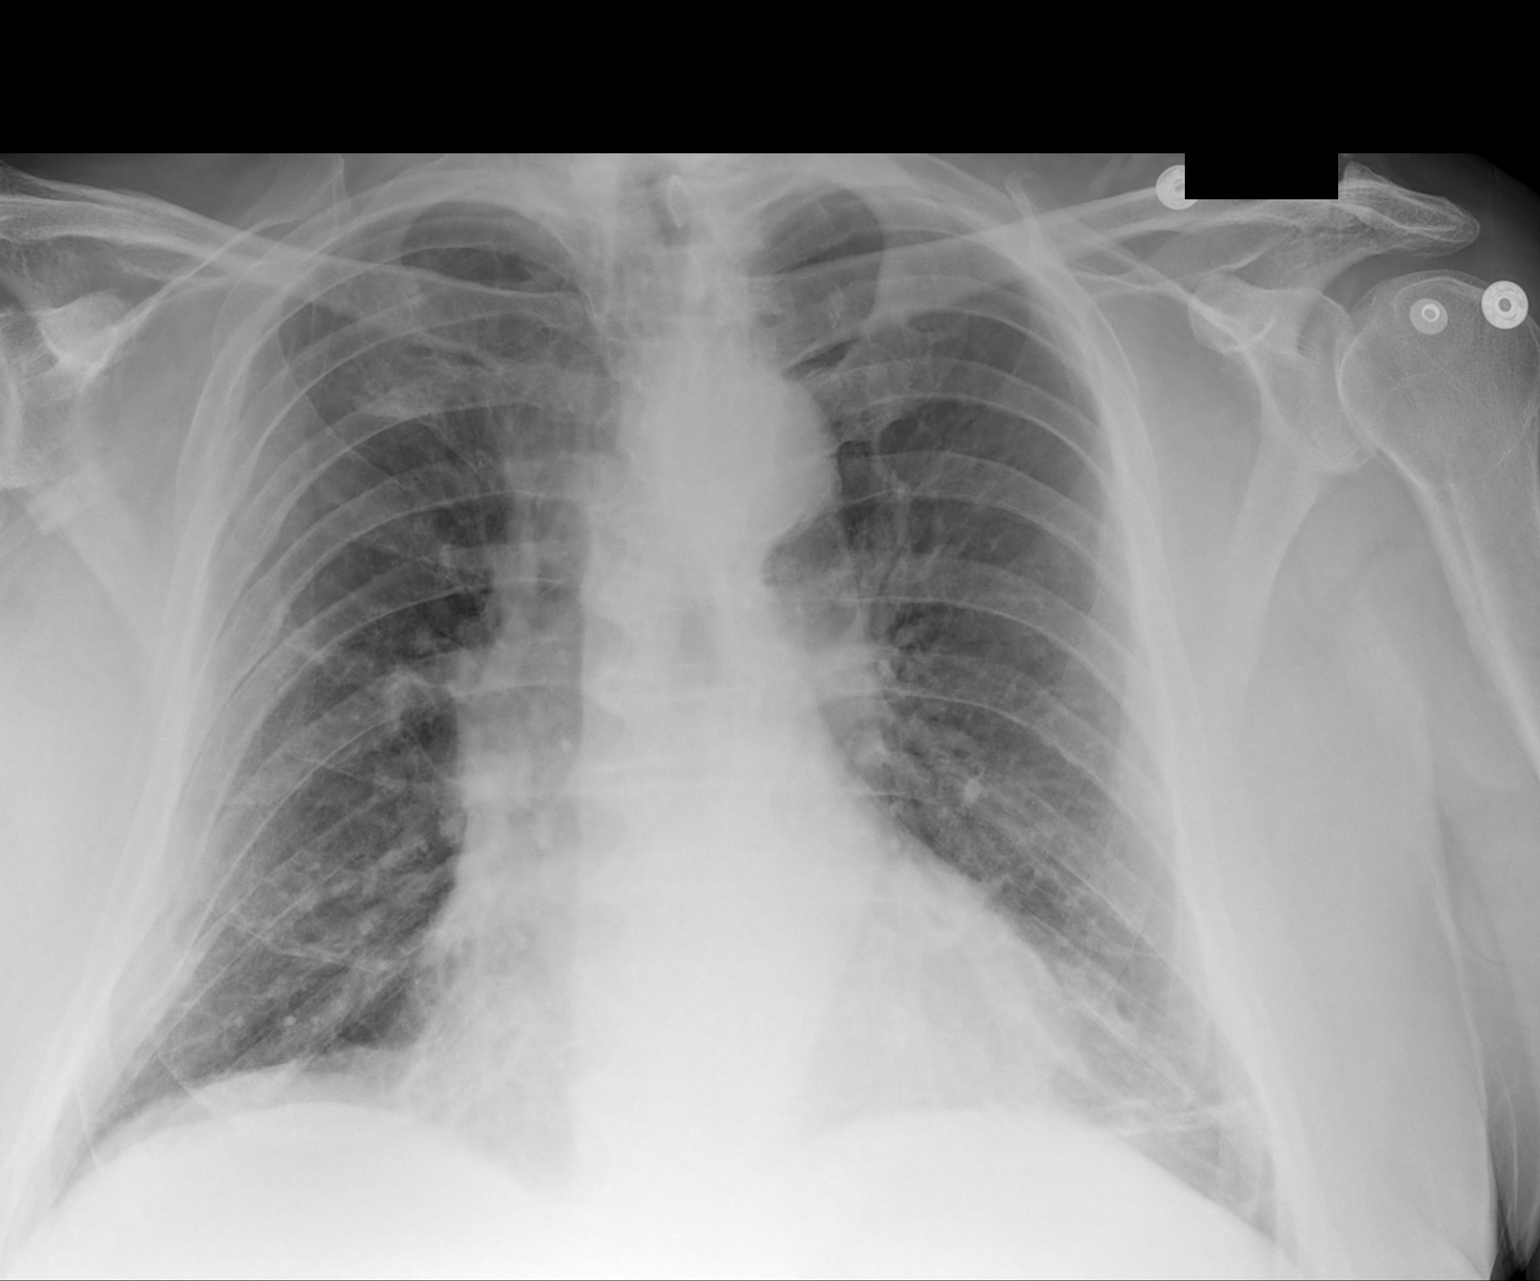

[1 of 1 positions shown; findings below may reference images not displayed]

FINDINGS: The lungs are well expanded.  Mildly more prominent left
basilar opacity likely reflects atelectasis, though pneumonia might
have a similar appearance.  Mild vascular congestion is seen.
Underlying chronic peribronchial thickening is noted.  No pleural
effusion or pneumothorax is identified.

The cardiomediastinal silhouette is mildly enlarged.  No acute
osseous abnormalities are identified.  There is minimal chronic
irregularity involving several right-sided ribs.
IMPRESSION: 1.  Mildly more prominent left basilar airspace opacity likely
reflects atelectasis, though pneumonia might have a similar
appearance.  Underlying chronic peribronchial thickening noted.
2.  Mild cardiomegaly and mild vascular congestion noted.

## 2013-11-09 IMAGING — CT CT ABD-PELV W/O CM
1 series · 14 of 32 positions shown, 18 images · non-contrast
Comparison: CT of the abdomen and pelvis performed 06/22/2012

CLINICAL DATA: Nausea and vomiting; diarrhea.  Weakness.

CT ABDOMEN AND PELVIS WITHOUT CONTRAST
TECHNIQUE: Multidetector CT imaging of the abdomen and pelvis was
performed following the standard protocol without intravenous
contrast.

[Series 6: lung · axial · 0.87mm/px · z∈[-276,-61]mm · 14 of 49 slices shown, 18 images]
[im 4/49  soft-tissue]
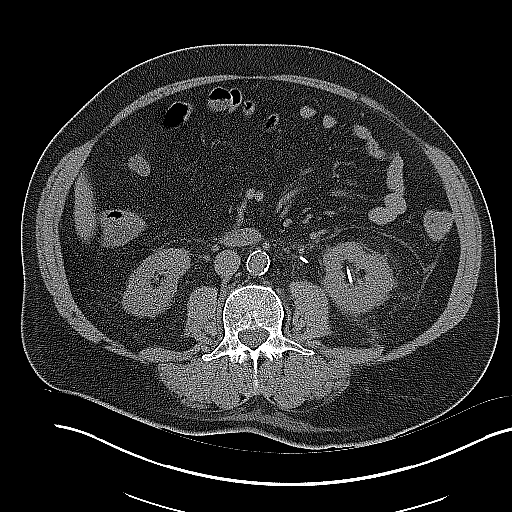
[im 4/49  bone]
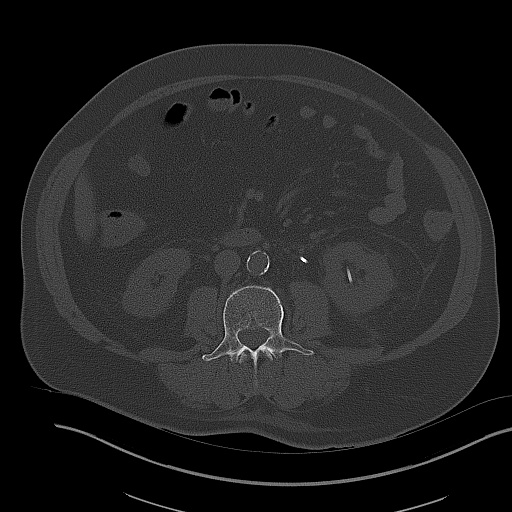
[im 7/49  soft-tissue]
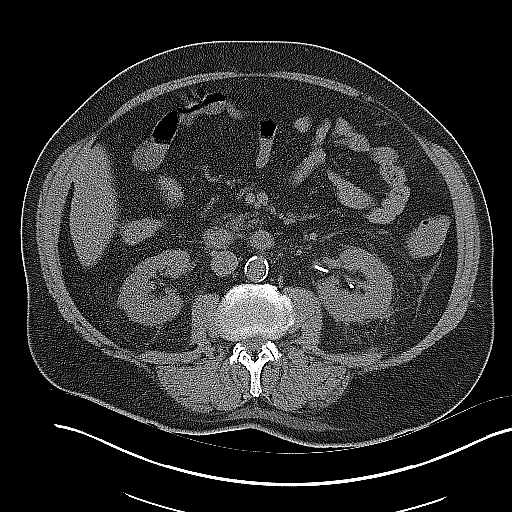
[im 11/49  soft-tissue]
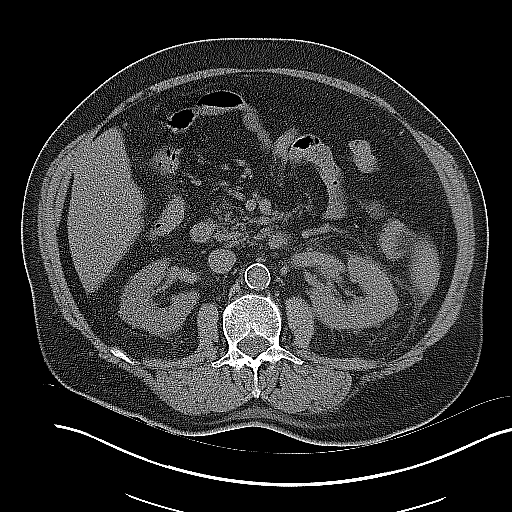
[im 14/49  soft-tissue]
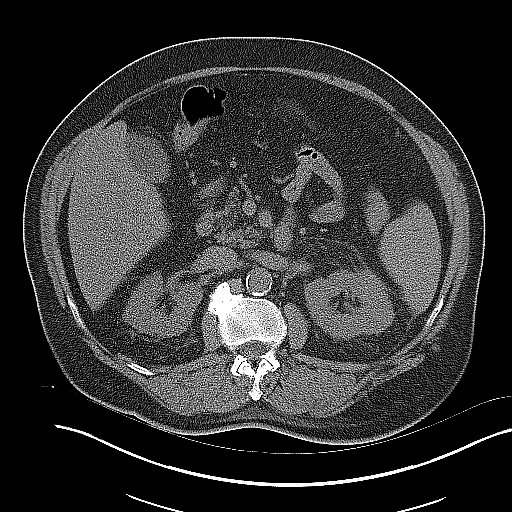
[im 19/49  soft-tissue]
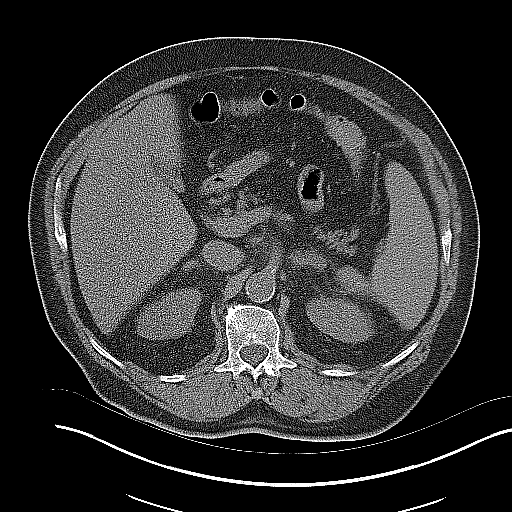
[im 22/49  soft-tissue]
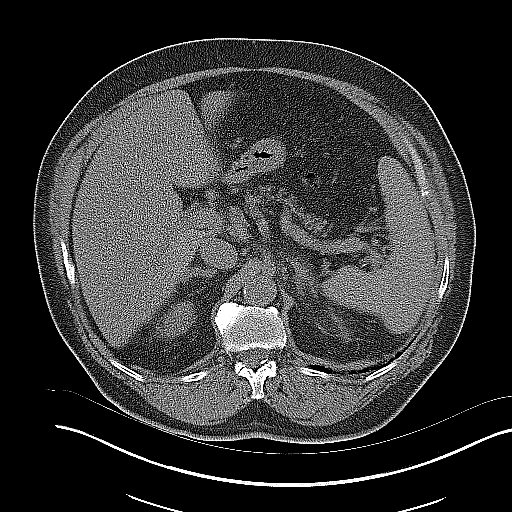
[im 27/49  soft-tissue]
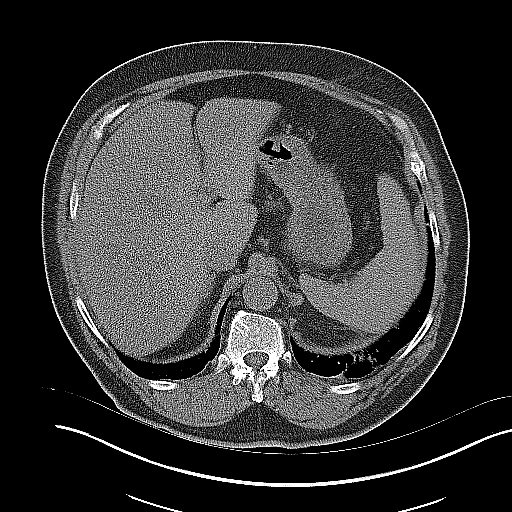
[im 30/49  soft-tissue]
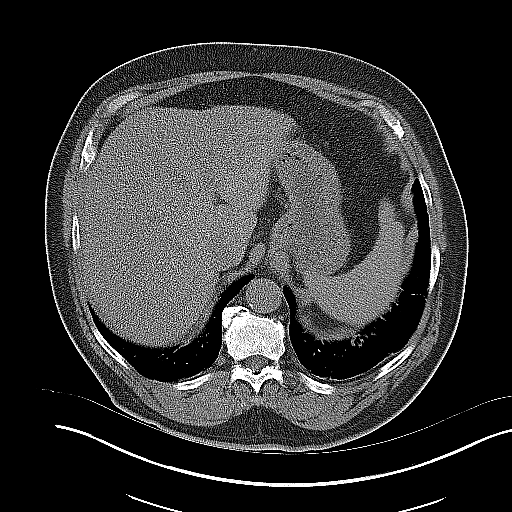
[im 35/49  soft-tissue]
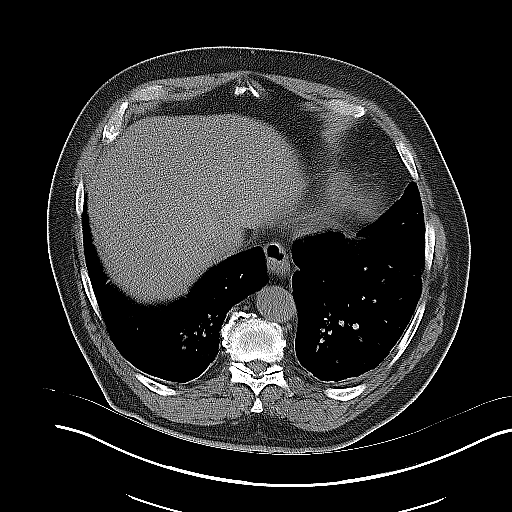
[im 35/49  bone]
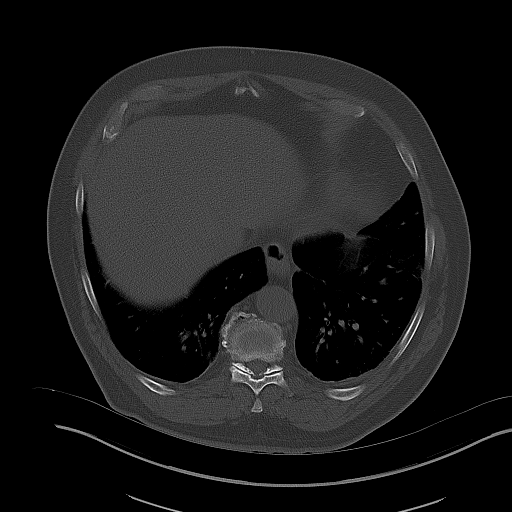
[im 38/49  soft-tissue]
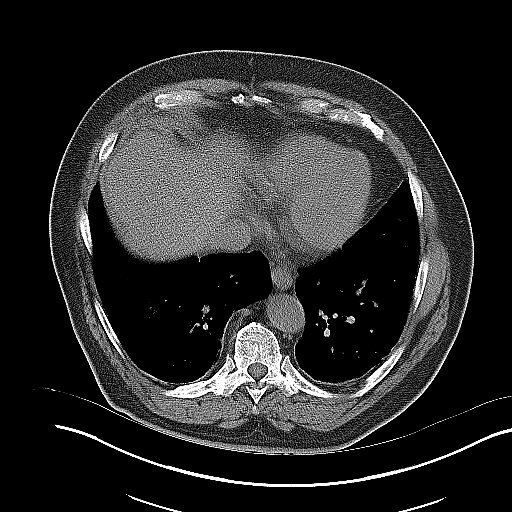
[im 42/49  soft-tissue]
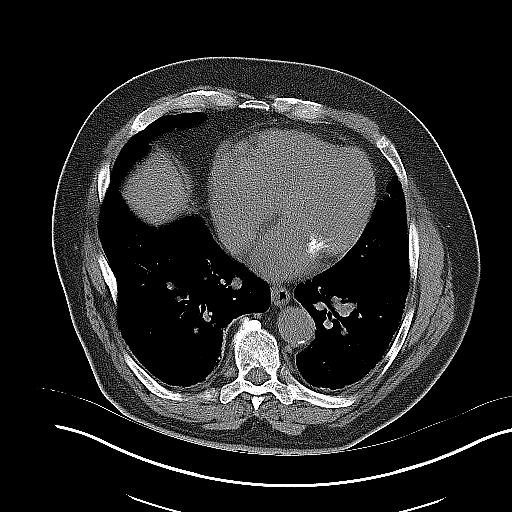
[im 42/49  lung]
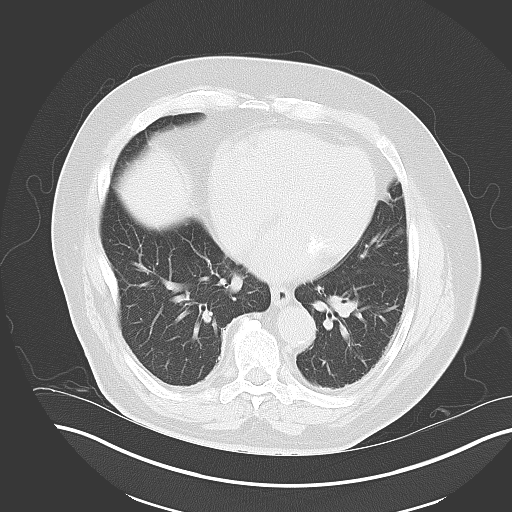
[im 44/49  lung]
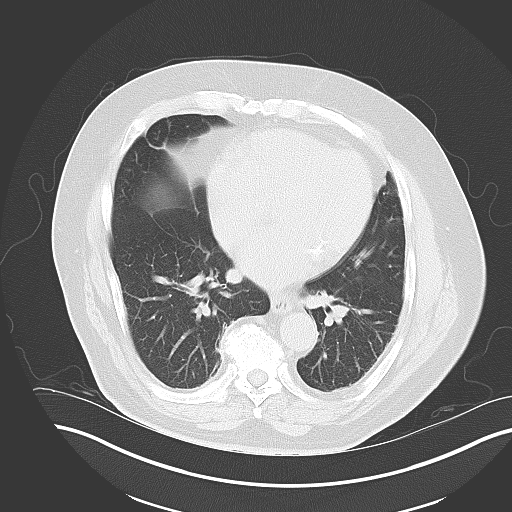
[im 45/49  soft-tissue]
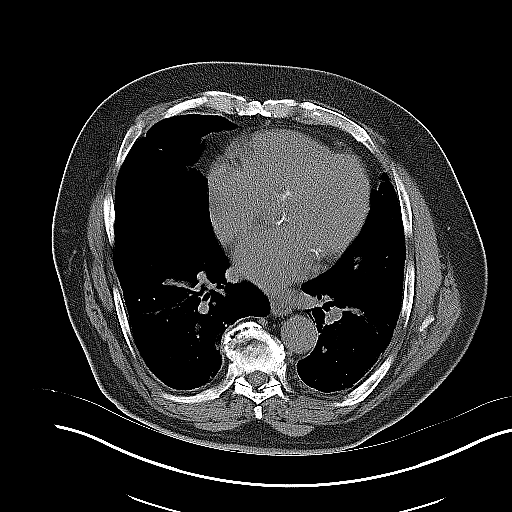
[im 45/49  lung]
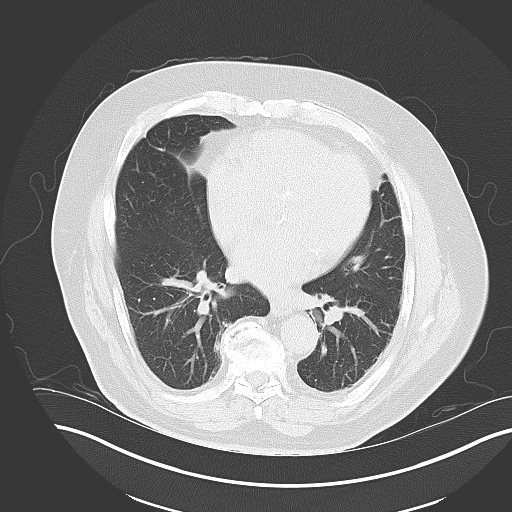
[im 47/49  lung]
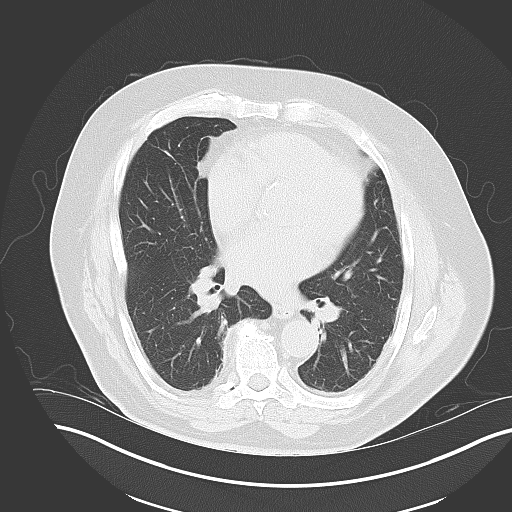

[14 of 32 positions shown; findings below may reference images not displayed]

FINDINGS: Minimal bibasilar atelectasis is noted.  Mild
calcification is noted at the mitral and aortic valves.

The liver and spleen are unremarkable in appearance. There is a
small apparent nodular extension at the fundus of the gallbladder,
measuring 1.3 cm.  This has a soft tissue attenuation, and appears
mildly increased in size from the prior study; right upper quadrant
abdominal ultrasound would be helpful for further evaluation, to
exclude a small soft tissue mass.  Alternatively, it could simply
reflect sludge within a Phrygian cap.  The pancreas and right
adrenal gland are unremarkable.  A small focus of fat is noted
within the left adrenal gland.

There is increased perinephric stranding and fluid on the left
side, with stranding about Gerota's fascia, and fluid tracking
inferiorly to the left pelvic sidewall.  This extends adjacent to
the course of the left ureter, with a left ureteral stent noted in
expected position.  Findings raise concern for left-sided
pyelonephritis and ureteritis.  There is also mild soft tissue
stranding about the bladder, raising question for cystitis.

There is no evidence of hydronephrosis.  No renal or ureteral
stones are seen.  Mild nonspecific right-sided perinephric
stranding is noted.  The right kidney is otherwise unremarkable in
appearance.

No free fluid is identified.  The small bowel is unremarkable in
appearance.  The stomach is within normal limits.  No acute
vascular abnormalities are seen.  Diffuse calcification is noted
along the abdominal aorta and its branches.

The appendix is normal in caliber, without evidence for
appendicitis.  Mild scattered diverticulosis is noted along the mid
sigmoid colon.  The colon is otherwise unremarkable in appearance.

The bladder is mildly distended; mild surrounding soft tissue
stranding is noted, as described above.  Postoperative change is
seen about the prostate bed.  No inguinal lymphadenopathy is seen.

No acute osseous abnormalities are identified.  Mild vacuum
phenomenon is noted at multiple levels along the lower lumbar
spine, with narrowing of the intervertebral disc space at L5-S1.
IMPRESSION: 1.  Increased perinephric stranding and fluid at the left kidney,
with stranding about along Gerota's fascia, and fluid tracking
inferiorly to the left pelvic sidewall.  Left ureteral stent noted
in expected position.  Findings raise concern for left-sided
pyelonephritis and ureteritis.  Mild soft tissue stranding about
the bladder raises question for cystitis.
2.  No evidence of hydronephrosis; no renal or ureteral stones
seen.
3.  Small apparent nodular extension at the fundus of the
gallbladder, measuring 1.3 cm.  This has soft tissue attenuation;
right upper quadrant abdominal ultrasound would be helpful for
further evaluation on an elective non-emergent basis, to exclude a
small soft tissue mass.  Alternatively, it could simply reflect
sludge within a Phrygian cap.
4.  Diffuse calcification along the abdominal aorta and its
branches.
5.  Mild scattered diverticulosis along the mid sigmoid colon.
6.  Mild degenerative change at the lower lumbar spine.

## 2013-11-12 DIAGNOSIS — C61 Malignant neoplasm of prostate: Secondary | ICD-10-CM | POA: Diagnosis not present

## 2013-11-19 DIAGNOSIS — N390 Urinary tract infection, site not specified: Secondary | ICD-10-CM | POA: Diagnosis not present

## 2013-11-19 DIAGNOSIS — I10 Essential (primary) hypertension: Secondary | ICD-10-CM | POA: Diagnosis not present

## 2013-11-19 DIAGNOSIS — N1330 Unspecified hydronephrosis: Secondary | ICD-10-CM | POA: Diagnosis not present

## 2013-11-19 DIAGNOSIS — K219 Gastro-esophageal reflux disease without esophagitis: Secondary | ICD-10-CM | POA: Diagnosis not present

## 2013-11-19 DIAGNOSIS — C61 Malignant neoplasm of prostate: Secondary | ICD-10-CM | POA: Diagnosis not present

## 2013-11-19 DIAGNOSIS — Z87891 Personal history of nicotine dependence: Secondary | ICD-10-CM | POA: Diagnosis not present

## 2013-11-28 ENCOUNTER — Encounter: Payer: Self-pay | Admitting: *Deleted

## 2013-11-28 ENCOUNTER — Other Ambulatory Visit: Payer: Self-pay | Admitting: *Deleted

## 2013-11-28 MED ORDER — GLUCOSE BLOOD VI STRP: ORAL_STRIP | Status: DC

## 2013-11-28 MED ORDER — LISINOPRIL-HYDROCHLOROTHIAZIDE 20-25 MG PO TABS: 1.0000 | ORAL_TABLET | Freq: Every day | ORAL | Status: DC

## 2013-12-18 ENCOUNTER — Ambulatory Visit: Payer: Medicare Other | Admitting: Internal Medicine

## 2013-12-18 ENCOUNTER — Ambulatory Visit (INDEPENDENT_AMBULATORY_CARE_PROVIDER_SITE_OTHER): Payer: Medicare Other | Admitting: Internal Medicine

## 2013-12-18 ENCOUNTER — Encounter: Payer: Self-pay | Admitting: Internal Medicine

## 2013-12-18 VITALS — BP 110/60 | HR 72 | Temp 97.8°F | Wt 264.2 lb

## 2013-12-18 DIAGNOSIS — C61 Malignant neoplasm of prostate: Secondary | ICD-10-CM | POA: Diagnosis not present

## 2013-12-18 DIAGNOSIS — E785 Hyperlipidemia, unspecified: Secondary | ICD-10-CM

## 2013-12-18 DIAGNOSIS — M1990 Unspecified osteoarthritis, unspecified site: Secondary | ICD-10-CM | POA: Diagnosis not present

## 2013-12-18 DIAGNOSIS — I951 Orthostatic hypotension: Secondary | ICD-10-CM | POA: Insufficient documentation

## 2013-12-18 DIAGNOSIS — I10 Essential (primary) hypertension: Secondary | ICD-10-CM

## 2013-12-18 DIAGNOSIS — E669 Obesity, unspecified: Secondary | ICD-10-CM

## 2013-12-18 DIAGNOSIS — E1169 Type 2 diabetes mellitus with other specified complication: Secondary | ICD-10-CM

## 2013-12-18 DIAGNOSIS — E119 Type 2 diabetes mellitus without complications: Secondary | ICD-10-CM | POA: Diagnosis not present

## 2013-12-18 MED ORDER — METOPROLOL TARTRATE 25 MG PO TABS: 12.5000 mg | ORAL_TABLET | Freq: Two times a day (BID) | ORAL | Status: DC

## 2013-12-18 NOTE — Progress Notes (Signed)
Patient ID: Jeremiah Collins, male   DOB: 07-Sep-1934, 80 y.o.   MRN: [id number]    No Known Allergies  Chief complaint: medical management of chronic issues  Chief Complaint  Patient presents with  . Medical Managment of Chronic Issues    f/u HTN/DM, BS averaging 123 for 90 days  . other    dizzy when he lays down and gets up in mornings/night x 1 month   HPI 80 y/o male pt is here for routine follow up. His bp in office is controlled today cbg at home avergae 123 over last 90 days. All readings are under 150 but nothing below 100 He has been feeling dizzy since taking of amlodipine with change of position Taking his cardura and self catheterizes himself occassionanlly. Follows with dr Rosana Hoes  improved heart rate  Review of Systems   Constitutional: Negative for fever, chills and diaphoresis.   HENT: Negative for congestion.    Eyes: Negative for blurred vision.   Cardiovascular: Negative for chest pain and palpitations.  Respiratory: no shortness of breath Gastrointestinal: Negative for heartburn and abdominal pain.   Genitourinary: Negative for dysuria, hematuria and flank pain.  Musculoskeletal: Positive for joint pain. Negative for falls. mobic has it better controlled Skin: Negative for itching and rash.   Neurological: Negative for dizziness, loss of consciousness, weakness and headaches.   Psychiatric/Behavioral: Negative for depression and memory loss. The patient does not have insomnia.    Current Outpatient Prescriptions on File Prior to Visit  Medication Sig Dispense Refill  . acetaminophen (TYLENOL) 500 MG tablet 1,000 mg. Take 1,000 mg by mouth every 6 (six) hours as needed.      . cholecalciferol (VITAMIN D) 400 UNITS TABS tablet Take 1 tablet (400 Units total) by mouth daily. Take 2 tablets daily  60 each  0  . doxazosin (CARDURA) 4 MG tablet TAKE ONE TABLET BY MOUTH ONCE DAILY  30 tablet  2  . glucose blood (ONETOUCH VERIO) test strip Check blood sugar 1-2 times  weekly.  DX: 250.00  100 each  11  . lisinopril-hydrochlorothiazide (PRINZIDE,ZESTORETIC) 20-25 MG per tablet Take 1 tablet by mouth daily.  30 tablet  3  . meloxicam (MOBIC) 15 MG tablet TAKE ONE TABLET BY MOUTH ONCE DAILY  30 tablet  3  . metFORMIN (GLUCOPHAGE) 500 MG tablet Take 1 tablet (500 mg total) by mouth daily with breakfast.  90 tablet  3  . Omega-3 Fatty Acids (FISH OIL) 1000 MG CAPS Take 1 capsule (1,000 mg total) by mouth daily.  90 capsule  3  . ONETOUCH DELICA LANCETS 78G MISC Check blood sugar 1-2 times weekly.   DX: 250.00  100 each  0   No current facility-administered medications on file prior to visit.    Past Medical History  Diagnosis Date  . Prostate cancer   . HTN (hypertension)   . Other and unspecified hyperlipidemia 01/02/2013  . Routine general medical examination at a health care facility   . Acute bronchitis 09/12/2012  . Cellulitis and abscess of leg, except foot 01/17/2012  . Gross hematuria 09/06/2011  . Osteoarthrosis involving, or with mention of more than one site, but not specified as generalized, multiple sites 10/22/2010  . Reflux esophagitis 05/10/2008  . First degree atrioventricular block 04/25/2007  . 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 Palpitations 04/28/2004  . Chest pain, unspecified 04/28/2004  . Closed fracture of five ribs 03/24/2004  . Spinal stenosis, unspecified region other than cervical 04/29/1995  . Other abnormal  blood chemistry 04/29/1991  . Cataract     Dr.Groat  . Type II or unspecified type diabetes mellitus without mention of complication, uncontrolled     Physical exam BP 110/60  Pulse 72  Temp(Src) 97.8 F (36.6 C) (Oral)  Wt 264 lb 3.2 oz (119.84 kg)  SpO2 97%  Laying down 120/70, hr 62/min; sitting 122/70, hr 70/min; standing 110/60, hr 72/min  Constitutional: He is oriented to person, place, and time. He appears well-developed and well-nourished. No distress. obese   Cardiovascular: Normal rate, regular rhythm  and normal heart sounds.    No murmur heard. 1+ edema bilaterally Pulmonary/Chest: Effort normal and breath sounds normal. No respiratory distress. He has no wheezes.   Abdominal: Soft. Bowel sounds are normal.  Musculoskeletal:  He exhibits no tenderness. Limited ROM in both shoulders, crepitus present, crepitus in both knees as well, obese  Lymphadenopathy:    He has no cervical adenopathy.  Neurological: He is alert and oriented to person, place, and time. No cranial nerve deficit.   Psychiatric: He has a normal mood and affect. His behavior is normal. Thought content normal.    Lab Results  Component Value Date   HGBA1C 6.7* 11/02/2013   Lipid Panel     Component Value Date/Time   TRIG 127 06/29/2013 0942   HDL 32* 06/29/2013 0942   CHOLHDL 5.0 06/29/2013 0942   LDLCALC 102* 06/29/2013 0942   CMP     Component Value Date/Time   NA 141 09/14/2013 0825   NA 135 12/12/2012 0435   K 4.6 09/14/2013 0825   CL 102 09/14/2013 0825   CO2 23 09/14/2013 0825   GLUCOSE 117* 09/14/2013 0825   GLUCOSE 113* 12/12/2012 0435   BUN 23 09/14/2013 0825   BUN 19 12/12/2012 0435   CREATININE 0.99 09/14/2013 0825   CALCIUM 8.9 09/14/2013 0825   PROT 5.9* 09/14/2013 0825   PROT 6.0 12/10/2012 0050   ALBUMIN 2.7* 12/10/2012 0050   AST 11 09/14/2013 0825   ALT 11 09/14/2013 0825   ALKPHOS 96 09/14/2013 0825   BILITOT 0.3 09/14/2013 0825   GFRNONAA 74 09/14/2013 0825   GFRAA 85 09/14/2013 0825   Assessment/plan  1. Essential hypertension, benign imporved but with orthostatic bp, will d/c amlodipine.a slo decrease lopressor to 12.5 mg bid  2. Diabetes mellitus type 2 in obese Reviewed a1c. Continue metformin 500 mg daily for now. Monitor cbg  3. Osteoarthritis Continue mobic, has been helpful  4. Prostate cancer Continue cardura for now, follow with urology  5. Hyperlipidemia Continue fish oil  6. Orthostatic hypotension Likely iatrogenic. Have made changes with bp meds, reassess in 4  weeks. Will rule out anemia if this persists

## 2013-12-18 NOTE — Patient Instructions (Signed)
Stop amlodipine from now  Cut lopressor into half and take half in the morning and half in the evening

## 2013-12-25 DIAGNOSIS — I10 Essential (primary) hypertension: Secondary | ICD-10-CM | POA: Diagnosis not present

## 2013-12-25 DIAGNOSIS — Z87891 Personal history of nicotine dependence: Secondary | ICD-10-CM | POA: Diagnosis not present

## 2013-12-25 DIAGNOSIS — R7309 Other abnormal glucose: Secondary | ICD-10-CM | POA: Diagnosis not present

## 2013-12-25 DIAGNOSIS — N393 Stress incontinence (female) (male): Secondary | ICD-10-CM | POA: Diagnosis not present

## 2013-12-25 DIAGNOSIS — E669 Obesity, unspecified: Secondary | ICD-10-CM | POA: Diagnosis not present

## 2013-12-25 DIAGNOSIS — C61 Malignant neoplasm of prostate: Secondary | ICD-10-CM | POA: Diagnosis not present

## 2013-12-25 DIAGNOSIS — N210 Calculus in bladder: Secondary | ICD-10-CM | POA: Diagnosis not present

## 2013-12-25 DIAGNOSIS — Z466 Encounter for fitting and adjustment of urinary device: Secondary | ICD-10-CM | POA: Diagnosis not present

## 2013-12-25 DIAGNOSIS — N135 Crossing vessel and stricture of ureter without hydronephrosis: Secondary | ICD-10-CM | POA: Diagnosis not present

## 2013-12-25 DIAGNOSIS — K219 Gastro-esophageal reflux disease without esophagitis: Secondary | ICD-10-CM | POA: Diagnosis not present

## 2013-12-25 DIAGNOSIS — N201 Calculus of ureter: Secondary | ICD-10-CM | POA: Diagnosis not present

## 2013-12-25 DIAGNOSIS — Z6836 Body mass index (BMI) 36.0-36.9, adult: Secondary | ICD-10-CM | POA: Diagnosis not present

## 2013-12-25 DIAGNOSIS — N1330 Unspecified hydronephrosis: Secondary | ICD-10-CM | POA: Diagnosis not present

## 2013-12-25 DIAGNOSIS — R972 Elevated prostate specific antigen [PSA]: Secondary | ICD-10-CM | POA: Diagnosis not present

## 2013-12-30 ENCOUNTER — Other Ambulatory Visit: Payer: Self-pay | Admitting: Internal Medicine

## 2014-01-14 DIAGNOSIS — N35919 Unspecified urethral stricture, male, unspecified site: Secondary | ICD-10-CM | POA: Diagnosis not present

## 2014-01-14 DIAGNOSIS — C61 Malignant neoplasm of prostate: Secondary | ICD-10-CM | POA: Diagnosis not present

## 2014-01-14 DIAGNOSIS — N1330 Unspecified hydronephrosis: Secondary | ICD-10-CM | POA: Diagnosis not present

## 2014-01-15 ENCOUNTER — Ambulatory Visit: Payer: Medicare Other | Admitting: Internal Medicine

## 2014-01-23 ENCOUNTER — Encounter: Payer: Self-pay | Admitting: Internal Medicine

## 2014-01-23 ENCOUNTER — Ambulatory Visit (INDEPENDENT_AMBULATORY_CARE_PROVIDER_SITE_OTHER): Payer: Medicare Other | Admitting: Internal Medicine

## 2014-01-23 VITALS — BP 142/78 | HR 68 | Temp 98.4°F | Wt 264.4 lb

## 2014-01-23 DIAGNOSIS — E669 Obesity, unspecified: Secondary | ICD-10-CM

## 2014-01-23 DIAGNOSIS — E1169 Type 2 diabetes mellitus with other specified complication: Secondary | ICD-10-CM

## 2014-01-23 DIAGNOSIS — E119 Type 2 diabetes mellitus without complications: Secondary | ICD-10-CM

## 2014-01-23 DIAGNOSIS — I10 Essential (primary) hypertension: Secondary | ICD-10-CM

## 2014-01-23 DIAGNOSIS — R0609 Other forms of dyspnea: Secondary | ICD-10-CM

## 2014-01-23 DIAGNOSIS — R0989 Other specified symptoms and signs involving the circulatory and respiratory systems: Secondary | ICD-10-CM

## 2014-01-23 DIAGNOSIS — R0600 Dyspnea, unspecified: Secondary | ICD-10-CM | POA: Insufficient documentation

## 2014-01-23 DIAGNOSIS — R42 Dizziness and giddiness: Secondary | ICD-10-CM | POA: Diagnosis not present

## 2014-01-23 MED ORDER — LISINOPRIL-HYDROCHLOROTHIAZIDE 10-12.5 MG PO TABS: 1.0000 | ORAL_TABLET | Freq: Every day | ORAL | Status: DC

## 2014-01-23 NOTE — Progress Notes (Signed)
Patient ID: Jeremiah Collins, male   DOB: 1934-07-21, 80 y.o.   MRN: [id number]     Chief Complaint  Patient presents with  . Medical Managment of Chronic Issues    dizziness, shortness of breath   No Known Allergies  HPI 80 y/o male pt is here for routine follow up. His dizziness has improved with reduction of metoprolol to some extent but still has some. He also complaints of noticeable dyspnea with exertion on and off for few months now but worsening recently. His cbg has been well controlled less than 124. Complaint with his meds. His PSA was elevated on follow up with urology and is undergoing treatment.Taking his cardura and self catheterizes himself occassionanlly. Follows with dr Rosana Hoes    Review of Systems   Constitutional: Negative for fever, chills and diaphoresis.   HENT: Negative for congestion.    Eyes: Negative for blurred vision.   Cardiovascular: Negative for chest pain and palpitations.  Respiratory: no wheezing, URI symptoms Gastrointestinal: Negative for heartburn and abdominal pain.   Genitourinary: Negative for dysuria, hematuria and flank pain.  Musculoskeletal: has joint aches. Negative for falls. mobic has it better controlled Skin: Negative for itching and rash.   Neurological: Negative for loss of consciousness, weakness and headaches.  has dizziness Psychiatric/Behavioral: Negative for depression and memory loss. The patient does not have insomnia.    Past Medical History  Diagnosis Date  . Prostate cancer   . HTN (hypertension)   . Other and unspecified hyperlipidemia 01/02/2013  . Routine general medical examination at a health care facility   . Acute bronchitis 09/12/2012  . Cellulitis and abscess of leg, except foot 01/17/2012  . Gross hematuria 09/06/2011  . Osteoarthrosis involving, or with mention of more than one site, but not specified as generalized, multiple sites 10/22/2010  . Reflux esophagitis 05/10/2008  . First degree atrioventricular  block 04/25/2007  . 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 Palpitations 04/28/2004  . Chest pain, unspecified 04/28/2004  . Closed fracture of five ribs 03/24/2004  . Spinal stenosis, unspecified region other than cervical 04/29/1995  . Other abnormal blood chemistry 04/29/1991  . Cataract     Dr.Groat  . Type II or unspecified type diabetes mellitus without mention of complication, uncontrolled    Current Outpatient Prescriptions on File Prior to Visit  Medication Sig Dispense Refill  . acetaminophen (TYLENOL) 500 MG tablet 1,000 mg. Take 1,000 mg by mouth every 6 (six) hours as needed.      . cholecalciferol (VITAMIN D) 400 UNITS TABS tablet Take 1 tablet (400 Units total) by mouth daily. Take 2 tablets daily  60 each  0  . doxazosin (CARDURA) 4 MG tablet TAKE ONE TABLET BY MOUTH ONCE DAILY  30 tablet  3  . glucose blood (ONETOUCH VERIO) test strip Check blood sugar 1-2 times weekly.  DX: 250.00  100 each  11  . meloxicam (MOBIC) 15 MG tablet TAKE ONE TABLET BY MOUTH ONCE DAILY  30 tablet  3  . metFORMIN (GLUCOPHAGE) 500 MG tablet Take 1 tablet (500 mg total) by mouth daily with breakfast.  90 tablet  3  . metoprolol tartrate (LOPRESSOR) 25 MG tablet Take 0.5 tablets (12.5 mg total) by mouth 2 (two) times daily.  60 tablet  3  . Omega-3 Fatty Acids (FISH OIL) 1000 MG CAPS Take 1 capsule (1,000 mg total) by mouth daily.  90 capsule  3  . ONETOUCH DELICA LANCETS 54Y MISC Check blood sugar 1-2 times weekly.  DX: 250.00  100 each  0   No current facility-administered medications on file prior to visit.   Past Surgical History  Procedure Laterality Date  . Left ureteral stent placement  Week of 12/05/11  . Prostatectomy  1996    Dr. Rosana Hoes  . Eye surgery  2006    cataract, Dr Shanon Rosser   Physical exam BP 142/78  Pulse 68  Temp(Src) 98.4 F (36.9 C) (Oral)  Wt 264 lb 6.4 oz (119.931 kg)  SpO2 95%  o2 sta after 3 min of brisk walking 95% on room air  Constitutional: He is oriented to person,  place, and time. He appears well-developed and well-nourished. No distress. obese   HEENT- no thyromegaly, no cervical lymphadenopathy Cardiovascular: Normal rate, regular rhythm and normal heart sounds.    No murmur heard. 1+ edema bilaterally Pulmonary/Chest: Effort normal and breath sounds normal. No respiratory distress. He has no wheezes.   Abdominal: Soft. Bowel sounds are normal.  Musculoskeletal:  He exhibits no tenderness. Limited ROM in both shoulders, crepitus present, crepitus in both knees as well, obese  Neurological: He is alert and oriented to person, place, and time. No cranial nerve deficit.   Psychiatric: He has a normal mood and affect. His behavior is normal. Thought content normal.  Labs- Lab Results  Component Value Date   HGBA1C 6.7* 11/02/2013   CMP     Component Value Date/Time   NA 141 09/14/2013 0825   NA 135 12/12/2012 0435   K 4.6 09/14/2013 0825   CL 102 09/14/2013 0825   CO2 23 09/14/2013 0825   GLUCOSE 117* 09/14/2013 0825   GLUCOSE 113* 12/12/2012 0435   BUN 23 09/14/2013 0825   BUN 19 12/12/2012 0435   CREATININE 0.99 09/14/2013 0825   CALCIUM 8.9 09/14/2013 0825   PROT 5.9* 09/14/2013 0825   PROT 6.0 12/10/2012 0050   ALBUMIN 2.7* 12/10/2012 0050   AST 11 09/14/2013 0825   ALT 11 09/14/2013 0825   ALKPHOS 96 09/14/2013 0825   BILITOT 0.3 09/14/2013 0825   GFRNONAA 74 09/14/2013 0825   GFRAA 85 09/14/2013 0825   Assessment/plan  1. Essential hypertension, benign Stable bp readings but given his dizziness, will decrease lisinopril-hctz to 10-12.5 mg daily for now. reassess - Hemoglobin A1c; Future - Lipid Panel; Future - CMP; Future  2. Diabetes mellitus type 2 in obese Continue metformin at current regimen for now - Hemoglobin A1c; Future - CMP; Future  3. Dizziness Continue metoprolol 12.5 mg bid for now. Will reduce lisinopril-hctz to 10-12.5 mg daily  4. Dyspnea New with progressive worsening. Will rule out chf and valvular  dysfunction by getting echocardiogram. Given his hx of smoking in past, will get cxr to rule out emphysematous changes. Will treat after interpreting the result - 2D Echocardiogram without contrast; Future - DG Chest 2 View; Future

## 2014-01-24 ENCOUNTER — Ambulatory Visit
Admission: RE | Admit: 2014-01-24 | Discharge: 2014-01-24 | Disposition: A | Discharge status: Home / Self Care | Payer: Medicare Other | Source: Ambulatory Visit | Attending: Internal Medicine | Admitting: Internal Medicine

## 2014-01-24 DIAGNOSIS — J438 Other emphysema: Secondary | ICD-10-CM | POA: Diagnosis not present

## 2014-01-24 DIAGNOSIS — R0600 Dyspnea, unspecified: Secondary | ICD-10-CM

## 2014-01-25 ENCOUNTER — Other Ambulatory Visit: Payer: Self-pay

## 2014-01-25 MED ORDER — ALBUTEROL SULFATE HFA 108 (90 BASE) MCG/ACT IN AERS: 2.0000 | INHALATION_SPRAY | Freq: Four times a day (QID) | RESPIRATORY_TRACT | Status: DC | PRN

## 2014-01-25 NOTE — Telephone Encounter (Signed)
Discussed with patient, patient verbalized understanding of x-ray result. Patient aware rx sent in to [facility], patient will call back to schedule 1 month follow-up after he checks his schedule

## 2014-01-29 ENCOUNTER — Ambulatory Visit (HOSPITAL_COMMUNITY)
Admission: RE | Admit: 2014-01-29 | Discharge: 2014-01-29 | Disposition: A | Discharge status: Home / Self Care | Payer: Medicare Other | Source: Ambulatory Visit | Attending: Internal Medicine | Admitting: Internal Medicine

## 2014-01-29 DIAGNOSIS — I059 Rheumatic mitral valve disease, unspecified: Secondary | ICD-10-CM | POA: Insufficient documentation

## 2014-01-29 DIAGNOSIS — E119 Type 2 diabetes mellitus without complications: Secondary | ICD-10-CM | POA: Insufficient documentation

## 2014-01-29 DIAGNOSIS — R0600 Dyspnea, unspecified: Secondary | ICD-10-CM

## 2014-01-29 DIAGNOSIS — R0609 Other forms of dyspnea: Secondary | ICD-10-CM | POA: Insufficient documentation

## 2014-01-29 DIAGNOSIS — I10 Essential (primary) hypertension: Secondary | ICD-10-CM | POA: Diagnosis not present

## 2014-01-29 DIAGNOSIS — R0989 Other specified symptoms and signs involving the circulatory and respiratory systems: Principal | ICD-10-CM | POA: Insufficient documentation

## 2014-01-29 DIAGNOSIS — I517 Cardiomegaly: Secondary | ICD-10-CM

## 2014-01-29 NOTE — Progress Notes (Signed)
Echocardiogram 2D Echocardiogram has been performed.  Jeremiah Collins 01/29/2014, 12:18 PM 

## 2014-02-04 DIAGNOSIS — C61 Malignant neoplasm of prostate: Secondary | ICD-10-CM | POA: Diagnosis not present

## 2014-02-05 ENCOUNTER — Ambulatory Visit (INDEPENDENT_AMBULATORY_CARE_PROVIDER_SITE_OTHER): Payer: Medicare Other | Admitting: Internal Medicine

## 2014-02-05 VITALS — BP 154/84 | HR 71 | Temp 98.0°F | Resp 16 | Wt 268.4 lb

## 2014-02-05 DIAGNOSIS — J439 Emphysema, unspecified: Secondary | ICD-10-CM | POA: Insufficient documentation

## 2014-02-05 DIAGNOSIS — J438 Other emphysema: Secondary | ICD-10-CM | POA: Diagnosis not present

## 2014-02-05 DIAGNOSIS — R42 Dizziness and giddiness: Secondary | ICD-10-CM | POA: Diagnosis not present

## 2014-02-05 MED ORDER — ALBUTEROL SULFATE HFA 108 (90 BASE) MCG/ACT IN AERS: 2.0000 | INHALATION_SPRAY | Freq: Three times a day (TID) | RESPIRATORY_TRACT | Status: DC | PRN

## 2014-02-05 MED ORDER — TIOTROPIUM BROMIDE MONOHYDRATE 18 MCG IN CAPS: 18.0000 ug | ORAL_CAPSULE | Freq: Every day | RESPIRATORY_TRACT | Status: DC

## 2014-02-05 NOTE — Patient Instructions (Signed)
Literature on emphysema has been provided to you.please read it and call us if you have any question  Use spiriva once a day  Use proventil 3 times a day as needed for shortness of breath

## 2014-02-05 NOTE — Progress Notes (Signed)
Patient ID: Jeremiah Collins, male   DOB: 12/06/1933, 80 y.o.   MRN: [id number]    Chief Complaint  Patient presents with  . Follow-up    shortness of breath   No Known Allergies  HPI 80 y/o male patient is here for follow up on his dyspnea. His dizziness has improved. He has been using proventil twice a day and this has barely helped with his breathing Reviewed his echocardiogram which is s/o mild diastolic dysfunction Reviewed chest xray s/o emphysema  Review of Systems   Constitutional: Negative for fever, chills and diaphoresis.   HENT: Negative for congestion.    Eyes: Negative for blurred vision.   Cardiovascular: Negative for chest pain and palpitations.  Respiratory: no wheezing, URI symptoms Gastrointestinal: Negative for heartburn and abdominal pain.   Genitourinary: Negative for dysuria, hematuria and flank pain.  Musculoskeletal: has joint aches. Negative for falls. mobic has it better controlled Skin: Negative for itching and rash.   Neurological: Negative for loss of consciousness, weakness and headaches.  has dizziness Psychiatric/Behavioral: Negative for depression and memory loss. The patient does not have insomnia.    Past Medical History  Diagnosis Date  . Prostate cancer   . HTN (hypertension)   . Other and unspecified hyperlipidemia 01/02/2013  . Routine general medical examination at a health care facility   . Acute bronchitis 09/12/2012  . Cellulitis and abscess of leg, except foot 01/17/2012  . Gross hematuria 09/06/2011  . Osteoarthrosis involving, or with mention of more than one site, but not specified as generalized, multiple sites 10/22/2010  . Reflux esophagitis 05/10/2008  . First degree atrioventricular block 04/25/2007  . 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 Palpitations 04/28/2004  . Chest pain, unspecified 04/28/2004  . Closed fracture of five ribs 03/24/2004  . Spinal stenosis, unspecified region other than cervical 04/29/1995  . Other  abnormal blood chemistry 04/29/1991  . Cataract     Dr.Groat  . Type II or unspecified type diabetes mellitus without mention of complication, uncontrolled    Past Surgical History  Procedure Laterality Date  . Left ureteral stent placement  Week of 12/05/11  . Prostatectomy  1996    Dr. Rosana Hoes  . Eye surgery  2006    cataract, Dr Shanon Rosser   Current Outpatient Prescriptions on File Prior to Visit  Medication Sig Dispense Refill  . acetaminophen (TYLENOL) 500 MG tablet 1,000 mg. Take 1,000 mg by mouth every 6 (six) hours as needed.      . cholecalciferol (VITAMIN D) 400 UNITS TABS tablet Take 1 tablet (400 Units total) by mouth daily. Take 2 tablets daily  60 each  0  . doxazosin (CARDURA) 4 MG tablet TAKE ONE TABLET BY MOUTH ONCE DAILY  30 tablet  3  . glucose blood (ONETOUCH VERIO) test strip Check blood sugar 1-2 times weekly.  DX: 250.00  100 each  11  . lisinopril-hydrochlorothiazide (PRINZIDE,ZESTORETIC) 10-12.5 MG per tablet Take 1 tablet by mouth daily.  90 tablet  3  . meloxicam (MOBIC) 15 MG tablet TAKE ONE TABLET BY MOUTH ONCE DAILY  30 tablet  3  . metFORMIN (GLUCOPHAGE) 500 MG tablet Take 1 tablet (500 mg total) by mouth daily with breakfast.  90 tablet  3  . metoprolol tartrate (LOPRESSOR) 25 MG tablet Take 0.5 tablets (12.5 mg total) by mouth 2 (two) times daily.  60 tablet  3  . Omega-3 Fatty Acids (FISH OIL) 1000 MG CAPS Take 1 capsule (1,000 mg total) by mouth daily.  [place]  capsule  3   No current facility-administered medications on file prior to visit.    Physical exam BP 154/84  Pulse 71  Temp(Src) 98 F (36.7 C) (Oral)  Resp 16  Wt 268 lb 6.4 oz (121.745 kg)  SpO2 98%  Constitutional: He is oriented to person, place, and time. He appears well-developed and well-nourished. No distress. obese   HEENT- no thyromegaly, no cervical lymphadenopathy Cardiovascular: Normal rate, regular rhythm and normal heart sounds.    No murmur heard. 1+ edema  bilaterally Pulmonary/Chest: Effort normal and breath sounds normal. No respiratory distress. He has no wheezes or crackles  Abdominal: Soft. Bowel sounds are normal.  Musculoskeletal:  He exhibits no tenderness. Limited ROM in both shoulders, crepitus present, crepitus in both knees as well, obese  Neurological: He is alert and oriented to person, place, and time. No cranial nerve deficit.   Psychiatric: He has a normal mood and affect. His behavior is normal. Thought content normal.   Assessment/plan  1. Emphysema lung This likely is causing the dyspnea. Will have him on spiriva once a day with prn proventil. Reassess in 1 month, if no improvement, consider ct chest to r/o ILD. Reading material on emphysema provided  2. Dizziness Improved with reduction of metoprolol and lisinopril-hctz dose. Monitor bp readings for hypertension

## 2014-03-04 ENCOUNTER — Ambulatory Visit
Admission: RE | Admit: 2014-03-04 | Discharge: 2014-03-04 | Disposition: A | Discharge status: Home / Self Care | Payer: Medicare Other | Source: Ambulatory Visit | Attending: Internal Medicine | Admitting: Internal Medicine

## 2014-03-04 ENCOUNTER — Ambulatory Visit (INDEPENDENT_AMBULATORY_CARE_PROVIDER_SITE_OTHER): Payer: Medicare Other | Admitting: Internal Medicine

## 2014-03-04 ENCOUNTER — Encounter: Payer: Self-pay | Admitting: Internal Medicine

## 2014-03-04 ENCOUNTER — Other Ambulatory Visit: Payer: Self-pay | Admitting: Internal Medicine

## 2014-03-04 VITALS — BP 166/68 | HR 80 | Temp 97.7°F | Wt 269.4 lb

## 2014-03-04 DIAGNOSIS — R52 Pain, unspecified: Secondary | ICD-10-CM

## 2014-03-04 DIAGNOSIS — R609 Edema, unspecified: Secondary | ICD-10-CM

## 2014-03-04 DIAGNOSIS — I10 Essential (primary) hypertension: Secondary | ICD-10-CM

## 2014-03-04 DIAGNOSIS — M7989 Other specified soft tissue disorders: Secondary | ICD-10-CM | POA: Diagnosis not present

## 2014-03-04 DIAGNOSIS — C61 Malignant neoplasm of prostate: Secondary | ICD-10-CM | POA: Diagnosis not present

## 2014-03-04 DIAGNOSIS — M79609 Pain in unspecified limb: Secondary | ICD-10-CM | POA: Diagnosis not present

## 2014-03-04 NOTE — Progress Notes (Signed)
Patient ID: Jeremiah Collins, male   DOB: 1934/09/25, 80 y.o.   MRN: [id number]   Location:  [facility]  Code Status: full code  No Known Allergies  Chief Complaint  Patient presents with  . Acute Visit    pain in LT leg when walking, red/warm to the touch & swollen x1 week after traveling to [place] back by plane. used Vicks vapor rub and taken Tylenol with little relief.    HPI: Patient is a 80 y.o. male seen in the office today for acute visit due to left leg pain when walking, redness and warmth to the touch and swollen.  Vicks vaporub and tylenol used.  Had pain even before he left Wednesday 4/15.  Came back here Saturday 4/18.  Has tender area on anterior shin.  Has prostate cancer--previous surgery in 1996, but it is back.  Has to see urologist tomorrow.  Has a stent and needs to have it changed out between his kidney and bladder.  Has preop visit tomorrow.    BP is up a little today.  Sats normal.  HR normal.  Review of Systems:  Review of Systems  Constitutional: Negative for malaise/fatigue.  HENT: Negative for congestion.   Respiratory: Positive for shortness of breath.   Cardiovascular: Positive for leg swelling. Negative for chest pain and palpitations.  Skin: Negative for itching and rash.       See hpi  Neurological: Negative for dizziness, sensory change, focal weakness, loss of consciousness and weakness.  Endo/Heme/Allergies: Does not bruise/bleed easily.    Past Medical History  Diagnosis Date  . Prostate cancer   . HTN (hypertension)   . Other and unspecified hyperlipidemia 01/02/2013  . Routine general medical examination at a health care facility   . Acute bronchitis 09/12/2012  . Cellulitis and abscess of leg, except foot 01/17/2012  . Gross hematuria 09/06/2011  . Osteoarthrosis involving, or with mention of more than one site, but not specified as generalized, multiple sites 10/22/2010  . Reflux esophagitis  05/10/2008  . First degree atrioventricular block 04/25/2007  . 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 Palpitations 04/28/2004  . Chest pain, unspecified 04/28/2004  . Closed fracture of five ribs 03/24/2004  . Spinal stenosis, unspecified region other than cervical 04/29/1995  . Other abnormal blood chemistry 04/29/1991  . Cataract     Dr.Groat  . Type II or unspecified type diabetes mellitus without mention of complication, uncontrolled     Past Surgical History  Procedure Laterality Date  . Left ureteral stent placement  Week of 12/05/11  . Prostatectomy  1996    Dr. Rosana Hoes  . Eye surgery  2006    cataract, Dr Shanon Rosser    Social History:   reports that he quit smoking about 45 years ago. His smoking use included Cigars. He does not have any smokeless tobacco history on file. He reports that he does not drink alcohol or use illicit drugs.  Family History  Problem Relation Age of Onset  . Heart disease Father   . Stroke Sister     Medications: Patient's Medications  New Prescriptions   No medications on file  Previous Medications   ACETAMINOPHEN (TYLENOL) 500 MG TABLET    1,000 mg. Take 1,000 mg by mouth every 6 (six) hours as needed.   ALBUTEROL (PROVENTIL HFA;VENTOLIN HFA) 108 (90 BASE) MCG/ACT INHALER    Inhale 2 puffs into the lungs 3 (three) times daily between meals as needed for wheezing  or shortness of breath.   CHOLECALCIFEROL (VITAMIN D) 400 UNITS TABS TABLET    Take 1 tablet (400 Units total) by mouth daily. Take 2 tablets daily   DOXAZOSIN (CARDURA) 4 MG TABLET    TAKE ONE TABLET BY MOUTH ONCE DAILY   GLUCOSE BLOOD (ONETOUCH VERIO) TEST STRIP    Check blood sugar 1-2 times weekly.  DX: 250.00   LISINOPRIL-HYDROCHLOROTHIAZIDE (PRINZIDE,ZESTORETIC) 10-12.5 MG PER TABLET    Take 1 tablet by mouth daily.   MELOXICAM (MOBIC) 15 MG TABLET    TAKE ONE TABLET BY MOUTH ONCE DAILY   METFORMIN (GLUCOPHAGE) 500 MG TABLET    Take 1 tablet (500 mg total) by mouth daily with breakfast.    METOPROLOL TARTRATE (LOPRESSOR) 25 MG TABLET    Take 0.5 tablets (12.5 mg total) by mouth 2 (two) times daily.   OMEGA-3 FATTY ACIDS (FISH OIL) 1000 MG CAPS    Take 1 capsule (1,000 mg total) by mouth daily.   ONETOUCH DELICA LANCETS 40J MISC    Check blood sugar 1 time weekly.   DX: 250.00   TIOTROPIUM (SPIRIVA) 18 MCG INHALATION CAPSULE    Place 1 capsule (18 mcg total) into inhaler and inhale daily.  Modified Medications   No medications on file  Discontinued Medications   No medications on file     Physical Exam: Filed Vitals:   03/04/14 0958  BP: 166/68  Pulse: 80  Temp: 97.7 F (36.5 C)  TempSrc: Oral  Weight: 269 lb 6.4 oz (122.199 kg)  SpO2: 96%  Physical Exam  Constitutional: He is oriented to person, place, and time. He appears well-developed and well-nourished. No distress.  Cardiovascular: Normal rate, regular rhythm, normal heart sounds and intact distal pulses.   Left LE with edema, tenderness superficially over anterior shin with slight bit of erythema present, is sore as he ambulates--is limping;  Negative homan's sign, no tenderness in posterior leg, ankle or foot  Pulmonary/Chest: Effort normal.  Some scattered rhonchi  Neurological: He is alert and oriented to person, place, and time.     Labs reviewed: Basic Metabolic Panel:  Recent Labs  06/29/13 0942 07/13/13 0844 09/14/13 0825  NA 141 143 141  K 4.4 4.7 4.6  CL 105 103 102  CO2 22 23 23   GLUCOSE 115* 94 117*  BUN 13 19 23   CREATININE 0.91 0.96 0.99  CALCIUM 8.7 9.0 8.9   Liver Function Tests:  Recent Labs  09/14/13 0825  AST 11  ALT 11  ALKPHOS 96  BILITOT 0.3  PROT 5.9*  Lipid Panel:  Recent Labs  06/29/13 0942  HDL 32*  LDLCALC 102*  TRIG 127  CHOLHDL 5.0   Lab Results  Component Value Date   HGBA1C 6.7* 11/02/2013   Assessment/Plan 1. Left leg swelling -suspect DVT due to prostate ca--had pain even before leaving to travel -also has DMII - Lower Extremity Venous  Duplex Left; Future--this afternoon at 230pm -could also have some superficial thrombophlebitis anteriorly -does not appear to have cellulitis and has no trauma history  -if positive for DVT, will treat with eliquis preferably -may use his mobic for pain though would not use long term in him due to his DMII, renal problems for which he follows with urology  2. Malignant neoplasm of prostate - keep f/u with urology -Lower Extremity Venous Duplex Left; Future  3. Essential hypertension, benign -elevated today--having pain in leg -cont metoprolol and ace/hctz combination  Labs/tests ordered:venous doppler with call report today Next  appt:  Keep with Dr. Bubba Camp for [facility]

## 2014-03-05 DIAGNOSIS — C61 Malignant neoplasm of prostate: Secondary | ICD-10-CM | POA: Diagnosis not present

## 2014-03-05 DIAGNOSIS — Z01818 Encounter for other preprocedural examination: Secondary | ICD-10-CM | POA: Diagnosis not present

## 2014-03-06 ENCOUNTER — Ambulatory Visit (INDEPENDENT_AMBULATORY_CARE_PROVIDER_SITE_OTHER): Payer: Medicare Other | Admitting: Internal Medicine

## 2014-03-06 ENCOUNTER — Encounter: Payer: Self-pay | Admitting: Internal Medicine

## 2014-03-06 VITALS — BP 160/78 | HR 71 | Temp 97.7°F | Wt 267.4 lb

## 2014-03-06 DIAGNOSIS — J438 Other emphysema: Secondary | ICD-10-CM | POA: Diagnosis not present

## 2014-03-06 DIAGNOSIS — E669 Obesity, unspecified: Secondary | ICD-10-CM

## 2014-03-06 DIAGNOSIS — E119 Type 2 diabetes mellitus without complications: Secondary | ICD-10-CM | POA: Diagnosis not present

## 2014-03-06 DIAGNOSIS — M79662 Pain in left lower leg: Secondary | ICD-10-CM

## 2014-03-06 DIAGNOSIS — M79609 Pain in unspecified limb: Secondary | ICD-10-CM

## 2014-03-06 DIAGNOSIS — E1169 Type 2 diabetes mellitus with other specified complication: Secondary | ICD-10-CM

## 2014-03-06 DIAGNOSIS — J439 Emphysema, unspecified: Secondary | ICD-10-CM

## 2014-03-06 MED ORDER — FUROSEMIDE 20 MG PO TABS: 20.0000 mg | ORAL_TABLET | Freq: Every day | ORAL | Status: DC

## 2014-03-06 NOTE — Progress Notes (Signed)
Patient ID: Jeremiah Collins, male   DOB: August 22, 1934, 80 y.o.   MRN: [id number]    Chief Complaint  Patient presents with  . Follow-up    1 month f/u Emphysema  . other    left leg pain and swelling   No Known Allergies  HPI 80 y.o male patient is here for follow up on his emphysema. He has hx of smoking and emphysema changes in his cxr. He was started on spiriva with prn albuterol 4 weeks back. He is tolerating this well. His breathing is stable. He has been having pain in his left leg now for few weeks- this is new. He has had mild chronic edema in both legs for some time now but the swelling has increased with more in left leg now. He has had venous doppler and dvt has been ruled out. No thrombophlebitis changes reported in ultrasound either and veins are noted to be patent. He has been taking his mobic and tylenol for pain and this has been helping him some. The pain is present both at rest and movement but movement makes it worse.  No trauma reported No falls No fever or chills  ROS Negative except for one in hpi  Past Medical History  Diagnosis Date  . Prostate cancer   . HTN (hypertension)   . Other and unspecified hyperlipidemia 01/02/2013  . Routine general medical examination at a health care facility   . Acute bronchitis 09/12/2012  . Cellulitis and abscess of leg, except foot 01/17/2012  . Gross hematuria 09/06/2011  . Osteoarthrosis involving, or with mention of more than one site, but not specified as generalized, multiple sites 10/22/2010  . Reflux esophagitis 05/10/2008  . First degree atrioventricular block 04/25/2007  . 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 Palpitations 04/28/2004  . Chest pain, unspecified 04/28/2004  . Closed fracture of five ribs 03/24/2004  . Spinal stenosis, unspecified region other than cervical 04/29/1995  . Other abnormal blood chemistry 04/29/1991  . Cataract     Dr.Groat  . Type II or unspecified type diabetes mellitus without mention of  complication, uncontrolled    Current Outpatient Prescriptions on File Prior to Visit  Medication Sig Dispense Refill  . acetaminophen (TYLENOL) 500 MG tablet 1,000 mg. Take 1,000 mg by mouth every 6 (six) hours as needed.      [place] Kitchen albuterol (PROVENTIL HFA;VENTOLIN HFA) 108 (90 BASE) MCG/ACT inhaler Inhale 2 puffs into the lungs 3 (three) times daily between meals as needed for wheezing or shortness of breath.  8.5 g  1  . cholecalciferol (VITAMIN D) 400 UNITS TABS tablet Take 1 tablet (400 Units total) by mouth daily. Take 2 tablets daily  60 each  0  . doxazosin (CARDURA) 4 MG tablet TAKE ONE TABLET BY MOUTH ONCE DAILY  30 tablet  3  . glucose blood (ONETOUCH VERIO) test strip Check blood sugar 1-2 times weekly.  DX: 250.00  100 each  11  . lisinopril-hydrochlorothiazide (PRINZIDE,ZESTORETIC) 10-12.5 MG per tablet Take 1 tablet by mouth daily.  90 tablet  3  . meloxicam (MOBIC) 15 MG tablet TAKE ONE TABLET BY MOUTH ONCE DAILY  30 tablet  3  . metFORMIN (GLUCOPHAGE) 500 MG tablet Take 1 tablet (500 mg total) by mouth daily with breakfast.  90 tablet  3  . metoprolol tartrate (LOPRESSOR) 25 MG tablet Take 0.5 tablets (12.5 mg total) by mouth 2 (two) times daily.  60 tablet  3  . Omega-3 Fatty Acids (FISH OIL) 1000 MG CAPS  Take 1 capsule (1,000 mg total) by mouth daily.  90 capsule  3  . ONETOUCH DELICA LANCETS 71I MISC Check blood sugar 1 time weekly.   DX: 250.00      . tiotropium (SPIRIVA) 18 MCG inhalation capsule Place 1 capsule (18 mcg total) into inhaler and inhale daily.  30 capsule  12   No current facility-administered medications on file prior to visit.   Past Surgical History  Procedure Laterality Date  . Left ureteral stent placement  Week of 12/05/11  . Prostatectomy  1996    Dr. Rosana Hoes  . Eye surgery  2006    cataract, Dr Shanon Rosser    Physical exam BP 160/78  Pulse 71  Temp(Src) 97.7 F (36.5 C) (Oral)  Wt 267 lb 6.4 oz (121.292 kg)  SpO2 95%  Constitutional: elderly male in  no acute distress Lungs: b/l clear to auscultation, no wheeze or rhonchi or crackles Cvs: Normal rate, regular rhythm, normal heart sounds and intact distal pulses. Musculoskeletal: left leg edema > right leg edema, mild erythema in anterior lower leg with tenderness on palpation, no calf muscle tenderness, no swelling in upper legs, no popliteal area tenderness, skin intact, no open area, hair loss in the leg area Neurological: He is alert and oriented to person, place, and time.  Skin: dry, warm, normal capillary refill  Imaging: 03/04/14  Doppler ultrasound of the left leg FINDINGS: Common Femoral Vein: No evidence of thrombus. Normal compressibility, respiratory phasicity and response to augmentation.   Saphenofemoral Junction: No evidence of thrombus. Normal compressibility and flow on color Doppler imaging.   Profunda Femoral Vein: No evidence of thrombus. Normal compressibility and flow on color Doppler imaging.   Femoral Vein: No evidence of thrombus. Normal compressibility, respiratory phasicity and response to augmentation.   Popliteal Vein: No evidence of thrombus. Normal compressibility, respiratory phasicity and response to augmentation.   Calf Veins: No evidence of thrombus. Normal compressibility and flow on color Doppler imaging.   Superficial Great Saphenous Vein: No evidence of thrombus. Normal compressibility and flow on color Doppler imaging.   Venous Reflux:  None.   Other Findings: Superficial great saphenous vein patent and compressible. No evidence of superficial thrombophlebitis. Superficial edema noted in the lower leg. Several small superficial venous varicosities are incidentally noted.   IMPRESSION: No evidence of deep venous thrombosis.    assessment/plan  1. Pain of left lower leg Unclear of the cause at present. dvt has been ruled out. It does not appear to be cellulitis, superficial thrombophlebitis has been ruled out. Will get ABI given  his claudication history and hx of smoking to rule out PAD. Lymphedema is a possibility given his hx of prostate cancer and recent rise in PSA again. He is seeing urology on Monday 03/11/14. If ABI comes out normal, will consider an MRI to help rule out spinal stenosis causing nerve impingement with the unilateral leg pain. Pt to continue his mobic and prn tylenol for now as this is keeping the pain under moderate control, will strengthen the pain med if needed. Explained this to the patient and voices understanding this - Ankle brachial index; Future  2. Emphysema lung Continue spiriva with prn albuterol for now  3. Diabetes mellitus type 2 in obese Monitor cbg and continue metformin for now

## 2014-03-07 ENCOUNTER — Ambulatory Visit (HOSPITAL_COMMUNITY)
Admission: RE | Admit: 2014-03-07 | Discharge: 2014-03-07 | Disposition: A | Discharge status: Home / Self Care | Payer: Medicare Other | Source: Ambulatory Visit | Attending: Internal Medicine | Admitting: Internal Medicine

## 2014-03-07 DIAGNOSIS — M79662 Pain in left lower leg: Secondary | ICD-10-CM

## 2014-03-07 DIAGNOSIS — M79609 Pain in unspecified limb: Secondary | ICD-10-CM | POA: Diagnosis not present

## 2014-03-07 NOTE — Progress Notes (Signed)
VASCULAR LAB PRELIMINARY  ARTERIAL  ABI completed:WNL    RIGHT    LEFT    PRESSURE WAVEFORM  PRESSURE WAVEFORM  BRACHIAL 163  B BRACHIAL 147 B  DP    DP    AT 169 B AT 176 B  PT 169  B PT 174  B  PER   PER    GREAT TOE  NA GREAT TOE  NA    RIGHT LEFT  ABI >1 >1     Klynn Linnemann R Alissandra Geoffroy, RVT 03/07/2014, 10:14 AM

## 2014-03-12 ENCOUNTER — Ambulatory Visit (INDEPENDENT_AMBULATORY_CARE_PROVIDER_SITE_OTHER): Payer: Medicare Other | Admitting: Internal Medicine

## 2014-03-12 ENCOUNTER — Encounter: Payer: Self-pay | Admitting: Internal Medicine

## 2014-03-12 VITALS — BP 162/76 | HR 70 | Temp 98.2°F | Wt 263.4 lb

## 2014-03-12 DIAGNOSIS — M79609 Pain in unspecified limb: Secondary | ICD-10-CM | POA: Diagnosis not present

## 2014-03-12 DIAGNOSIS — M79605 Pain in left leg: Secondary | ICD-10-CM | POA: Insufficient documentation

## 2014-03-12 MED ORDER — TRAMADOL HCL 50 MG PO TABS: 50.0000 mg | ORAL_TABLET | Freq: Three times a day (TID) | ORAL | Status: DC | PRN

## 2014-03-12 NOTE — Progress Notes (Signed)
Patient ID: Jeremiah Collins, male   DOB: [birth year], 80 y.o.   MRN: [id number]    Chief Complaint  Patient presents with  . Follow-up    1 week f/u leg pain & discuss ABI results   No Known Allergies  HPI 80 y/o male patient is here for follow up on his leg pain. His pain persists and is in left mid leg area. Doppler ultrasound and ABI reviewed and they are normal. The edema in both leg with left > right persists but has been decreased some with daily lasix.The pain is present both at rest and movement but movement makes it worse.   No trauma reported No falls No fever or chills Has hx of back injuries in past. No back pain at present  ROS Negative except for one in hpi  Past Medical History  Diagnosis Date  . Prostate cancer   . HTN (hypertension)   . Other and unspecified hyperlipidemia 01/02/2013  . Routine general medical examination at a health care facility   . Acute bronchitis 09/12/2012  . Cellulitis and abscess of leg, except foot 01/17/2012  . Gross hematuria 09/06/2011  . Osteoarthrosis involving, or with mention of more than one site, but not specified as generalized, multiple sites 10/22/2010  . Reflux esophagitis 05/10/2008  . First degree atrioventricular block 04/25/2007  . 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 Palpitations 04/28/2004  . Chest pain, unspecified 04/28/2004  . Closed fracture of five ribs 03/24/2004  . Spinal stenosis, unspecified region other than cervical 04/29/1995  . Other abnormal blood chemistry 04/29/1991  . Cataract     Dr.Groat  . Type II or unspecified type diabetes mellitus without mention of complication, uncontrolled    Past Surgical History  Procedure Laterality Date  . Left ureteral stent placement  Week of 12/05/11  . Prostatectomy  1996    Dr. Rosana Hoes  . Eye surgery  2006    cataract, Dr Shanon Rosser   Medication reviewed. See [facility]  Physical exam BP 162/76  Pulse 70  Temp(Src) 98.2 F (36.8 C) (Oral)  Wt 263 lb 6.4 oz (119.477 kg)   SpO2 97%  Constitutional: elderly male in no acute distress Lungs: b/l clear to auscultation, no wheeze or rhonchi or crackles Cvs: Normal rate, regular rhythm, normal heart sounds and intact distal pulses. Musculoskeletal: left leg edema > right leg edema, mild erythema in anterior lower leg with tenderness on palpation, no calf muscle tenderness, no swelling in upper legs, no popliteal area tenderness, skin intact, no open area, hair loss in the leg area. No spinal tenderness Neurological: He is alert and oriented to person, place, and time.   Skin: dry, warm, normal capillary refill  Imaging: 03/04/14   Doppler ultrasound of the left leg FINDINGS: Common Femoral Vein: No evidence of thrombus. Normal compressibility, respiratory phasicity and response to augmentation.   Saphenofemoral Junction: No evidence of thrombus. Normal compressibility and flow on color Doppler imaging.   Profunda Femoral Vein: No evidence of thrombus. Normal compressibility and flow on color Doppler imaging.   Femoral Vein: No evidence of thrombus. Normal compressibility, respiratory phasicity and response to augmentation.   Popliteal Vein: No evidence of thrombus. Normal compressibility, respiratory phasicity and response to augmentation.   Calf Veins: No evidence of thrombus. Normal compressibility and flow on color Doppler imaging.   Superficial Great Saphenous Vein: No evidence of thrombus. Normal compressibility and flow on color Doppler imaging.   Venous Reflux:  None.   Other Findings: Superficial great saphenous vein  patent and compressible. No evidence of superficial thrombophlebitis. Superficial edema noted in the lower leg. Several small superficial venous varicosities are incidentally noted.   IMPRESSION: No evidence of deep venous thrombosis.    assessment/plan  1. Pain of left leg dvt and PAD has been ruled out. Not a clinical picture of cellulitis or thrombophlebitis. Given his hx  of back injuries, will get MRI to help rule out spinal stenosis and nerve impingement which could be causing this unilateral pain. Will d/c  Tylenol and mobic and have him on tramadool 50 mg q8h prn with 2 tab if pain is severe. Lymphedema is a possibility given his hx of prostate cancer and recent rise in PSA again - MR Lumbar Spine Wo Contrast; Future

## 2014-03-14 DIAGNOSIS — K829 Disease of gallbladder, unspecified: Secondary | ICD-10-CM | POA: Diagnosis not present

## 2014-03-14 DIAGNOSIS — Q441 Other congenital malformations of gallbladder: Secondary | ICD-10-CM | POA: Diagnosis not present

## 2014-03-14 DIAGNOSIS — I7090 Unspecified atherosclerosis: Secondary | ICD-10-CM | POA: Diagnosis not present

## 2014-03-14 DIAGNOSIS — M949 Disorder of cartilage, unspecified: Secondary | ICD-10-CM | POA: Diagnosis not present

## 2014-03-14 DIAGNOSIS — C7951 Secondary malignant neoplasm of bone: Secondary | ICD-10-CM | POA: Diagnosis not present

## 2014-03-14 DIAGNOSIS — Q445 Other congenital malformations of bile ducts: Secondary | ICD-10-CM | POA: Diagnosis not present

## 2014-03-14 DIAGNOSIS — M7730 Calcaneal spur, unspecified foot: Secondary | ICD-10-CM | POA: Diagnosis not present

## 2014-03-14 DIAGNOSIS — C61 Malignant neoplasm of prostate: Secondary | ICD-10-CM | POA: Diagnosis not present

## 2014-03-14 DIAGNOSIS — IMO0002 Reserved for concepts with insufficient information to code with codable children: Secondary | ICD-10-CM | POA: Diagnosis not present

## 2014-03-14 DIAGNOSIS — M899 Disorder of bone, unspecified: Secondary | ICD-10-CM | POA: Diagnosis not present

## 2014-03-19 DIAGNOSIS — Z8546 Personal history of malignant neoplasm of prostate: Secondary | ICD-10-CM | POA: Diagnosis not present

## 2014-03-19 DIAGNOSIS — Z466 Encounter for fitting and adjustment of urinary device: Secondary | ICD-10-CM | POA: Diagnosis not present

## 2014-03-19 DIAGNOSIS — K219 Gastro-esophageal reflux disease without esophagitis: Secondary | ICD-10-CM | POA: Diagnosis not present

## 2014-03-19 DIAGNOSIS — N393 Stress incontinence (female) (male): Secondary | ICD-10-CM | POA: Diagnosis not present

## 2014-03-19 DIAGNOSIS — C61 Malignant neoplasm of prostate: Secondary | ICD-10-CM | POA: Diagnosis not present

## 2014-03-19 DIAGNOSIS — N135 Crossing vessel and stricture of ureter without hydronephrosis: Secondary | ICD-10-CM | POA: Diagnosis not present

## 2014-03-19 DIAGNOSIS — N1330 Unspecified hydronephrosis: Secondary | ICD-10-CM | POA: Diagnosis not present

## 2014-03-19 DIAGNOSIS — E119 Type 2 diabetes mellitus without complications: Secondary | ICD-10-CM | POA: Diagnosis not present

## 2014-03-19 DIAGNOSIS — C801 Malignant (primary) neoplasm, unspecified: Secondary | ICD-10-CM | POA: Diagnosis not present

## 2014-03-19 DIAGNOSIS — N320 Bladder-neck obstruction: Secondary | ICD-10-CM | POA: Diagnosis not present

## 2014-03-19 DIAGNOSIS — I10 Essential (primary) hypertension: Secondary | ICD-10-CM | POA: Diagnosis not present

## 2014-03-19 DIAGNOSIS — R319 Hematuria, unspecified: Secondary | ICD-10-CM | POA: Diagnosis not present

## 2014-03-22 DIAGNOSIS — C61 Malignant neoplasm of prostate: Secondary | ICD-10-CM | POA: Diagnosis not present

## 2014-03-25 DIAGNOSIS — K7689 Other specified diseases of liver: Secondary | ICD-10-CM | POA: Diagnosis not present

## 2014-03-26 ENCOUNTER — Encounter: Payer: Self-pay | Admitting: Radiation Oncology

## 2014-03-26 ENCOUNTER — Other Ambulatory Visit: Payer: Medicare Other

## 2014-03-26 NOTE — Progress Notes (Signed)
Histology and Location of Primary Cancer: prostate  Sites of Visceral and Bony Metastatic Disease: left mid tibial shaft  Location(s) of Symptomatic Metastases: left tibial shaft  Past/Anticipated chemotherapy by medical oncology, if any: none  Pain on a scale of 0-10 is:  10/10 if standing, 6/10 otherwise, constant. He takes Hydrocodone , 1 tab every 4-6 hrs w/fair relief.  If Spine Met(s), symptoms, if any, include:  Bowel/Bladder retention or incontinence (please describe): pt reports some urinary retention he possibly attributed to taking too much Hydrocodone, he has urinary incontinence at times  Numbness or weakness in extremities (please describe): denies  Current Decadron regimen, if applicable: none  Ambulatory status? Walker? Wheelchair?: wheelchair, pt uses crutches at home  SAFETY ISSUES:  Prior radiation/ yes, Dec 1999- Jan 2000, 6040 cGy in 35 fx to prostate fossa, Dr Danny Lawless  Pacemaker/ICD? no  Possible current pregnancy? na  Is the patient on methotrexate? no  Current Complaints / other details:  Lupron 22.5 mg initiated 12/25/12 , remains on intermittent Lupron, last injection w/Dr Rosana Hoes 6-8 weeks ago, a 3 month injection Swelling of left lower leg, ankle , foot. Pt taking Lasix.

## 2014-03-27 ENCOUNTER — Ambulatory Visit
Admission: RE | Admit: 2014-03-27 | Discharge: 2014-03-27 | Disposition: A | Discharge status: Home / Self Care | Payer: Medicare Other | Source: Ambulatory Visit | Attending: Radiation Oncology | Admitting: Radiation Oncology

## 2014-03-27 ENCOUNTER — Encounter: Payer: Self-pay | Admitting: Radiation Oncology

## 2014-03-27 VITALS — BP 114/55 | HR 67 | Temp 98.2°F | Resp 20 | Ht 71.0 in | Wt 263.0 lb

## 2014-03-27 DIAGNOSIS — C61 Malignant neoplasm of prostate: Secondary | ICD-10-CM | POA: Diagnosis not present

## 2014-03-27 DIAGNOSIS — Z510 Encounter for antineoplastic radiation therapy: Secondary | ICD-10-CM | POA: Diagnosis not present

## 2014-03-27 DIAGNOSIS — C7952 Secondary malignant neoplasm of bone marrow: Principal | ICD-10-CM

## 2014-03-27 DIAGNOSIS — C7951 Secondary malignant neoplasm of bone: Secondary | ICD-10-CM | POA: Diagnosis not present

## 2014-03-27 DIAGNOSIS — C7650 Malignant neoplasm of unspecified lower limb: Secondary | ICD-10-CM | POA: Insufficient documentation

## 2014-03-27 HISTORY — DX: Personal history of irradiation: Z92.3

## 2014-03-27 HISTORY — DX: Secondary malignant neoplasm of bone: C79.51

## 2014-03-27 MED ORDER — OXYCODONE-ACETAMINOPHEN 5-325 MG PO TABS: 2.0000 | ORAL_TABLET | Freq: Four times a day (QID) | ORAL | Status: DC | PRN

## 2014-03-27 NOTE — Progress Notes (Signed)
Please see the Nurse Progress Note in the MD Initial Consult Encounter for this patient. 

## 2014-03-27 NOTE — Progress Notes (Signed)
[facility] NEW PATIENT EVALUATION  Name: Jeremiah Collins MRN: [id number]  Date:   03/27/2014           DOB: 19-Nov-1933  Status: outpatient   CC: Blanchie Serve, MD  Lorayne Bender, MD,  Dr. 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 male who is seen today for the courtesy of Dr. Tharon Aquas for consideration of palliative radiotherapy, closer to home, to his left tibia in the management of his metastatic carcinoma of prostate to bone. He was initially diagnosed with intermediate risk adenocarcinoma prostate (Gleason 7, PSA 4.6 ) in 1996 and underwent a radical retropubic prostatectomy. He developed PSA recurrent disease and underwent salvage radiation therapy with Dr. Danny Lawless in 1999. He developed a biochemical failure in 2013. In August of 2013 his PSA was 2.43. Staging workup was without evidence for metastatic disease. In September 2013 he had a biopsy of his bladder neck which was consistent with metastatic carcinoma the prostate. In general 2014 he had a TURP/P. with placement of a left ureteral stent. He was started on androgen deprivation therapy in February 2014 with his last injection in March of 2014. More recently, his PSA values have been rising, he was seen by Dr. Carrie Mew for metastatic cancer persistent carcinoma prostate. The patient tells me that his most recent PSA was approximately 10. His most recent bone scan on 03/14/2014 showed activity along the distal tibial diaphysis, which has progressed since September 2013. There is also new osseous metastatic disease to his left superior pubic ramus. Plain films showed a subtle sclerotic lesion within the mid tibial shaft, slightly expansile with some in the osteal thickening but no definite cortical breakthrough. He states that his pain is  10/10 and he takes up to 3-4 hydrocodone/APAP (5/325) tabs a day. He requests something stronger for pain. I do not have his bone scan CD for review today. This has been requested. He denies other sites of pain. He is on MiraLax to prevent constipation.   PREVIOUS RADIATION THERAPY: Yes, as described above.   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     PAST SURGICAL HISTORY:  Past Surgical History  Procedure Laterality Date  . Left ureteral stent placement  Week of 12/05/11  . Prostatectomy  1996    Dr. Rosana Hoes  . Eye surgery  2006    cataract, Dr Shanon Rosser     FAMILY HISTORY: family history includes Heart disease in his father; Stroke in his sister.   SOCIAL HISTORY:  reports that he quit smoking about 45 years ago. His smoking use included Cigars. He does not have any smokeless tobacco history on file. He reports that he  does not drink alcohol or use illicit drugs.   ALLERGIES: Review of patient's allergies indicates no known allergies.   MEDICATIONS:  Current Outpatient Prescriptions  Medication Sig  Dispense Refill  . albuterol (PROVENTIL HFA;VENTOLIN HFA) 108 (90 BASE) MCG/ACT inhaler Inhale 2 puffs into the lungs 3 (three) times daily between meals as needed for wheezing or shortness of breath.  8.5 g  1  . cholecalciferol (VITAMIN D) 400 UNITS TABS tablet Take 1 tablet (400 Units total) by mouth daily. Take 2 tablets daily  60 each  0  . doxazosin (CARDURA) 4 MG tablet TAKE ONE TABLET BY MOUTH ONCE DAILY  30 tablet  3  . furosemide (LASIX) 20 MG tablet Take 1 tablet (20 mg total) by mouth daily.  30 tablet  0  . glucose blood (ONETOUCH VERIO) test strip Check blood sugar 1-2 times weekly.  DX: 250.00  100 each  11  . HYDROcodone-acetaminophen (NORCO) 5-325 MG per tablet Take 2 tablets by mouth.      Marland Kitchen lisinopril-hydrochlorothiazide (PRINZIDE,ZESTORETIC) 10-12.5 MG per tablet Take 1 tablet by mouth daily.  90 tablet  3  . metFORMIN (GLUCOPHAGE) 500 MG tablet Take 1 tablet (500 mg total) by mouth daily with breakfast.  90 tablet  3  . metoprolol tartrate (LOPRESSOR) 25 MG tablet Take 0.5 tablets (12.5 mg total) by mouth 2 (two) times daily.  60 tablet  3  . Omega-3 Fatty Acids (FISH OIL) 1000 MG CAPS Take 1 capsule (1,000 mg total) by mouth daily.  90 capsule  3  . ONETOUCH DELICA LANCETS 32R MISC Check blood sugar 1 time weekly.   DX: 250.00      . tiotropium (SPIRIVA) 18 MCG inhalation capsule Place 1 capsule (18 mcg total) into inhaler and inhale daily.  30 capsule  12  . traMADol (ULTRAM) 50 MG tablet Take 1 tablet (50 mg total) by mouth every 8 (eight) hours as needed.  90 tablet  0  . oxyCODONE-acetaminophen (PERCOCET/ROXICET) 5-325 MG per tablet Take 2 tablets by mouth every 6 (six) hours as needed for severe pain.  60 tablet  0   No current facility-administered medications for this encounter.     REVIEW OF SYSTEMS:  Pertinent items are noted in HPI.    PHYSICAL EXAM:  height is 5\' 11"  (1.803 m) and weight is 263 lb (119.296 kg). His oral temperature is 98.2 F (36.8 C). His  blood pressure is 114/55 and his pulse is 67. His respiration is 20.   Alert and oriented. Head neck examination: Grossly unremarkable. Nodes: Without palpable cervical or supraclavicular lymphadenopathy. Chest: Lungs clear. Abdomen: Without hepatomegaly. Extremities: Is palpable discomfort along the mid to distal left tibia. There is bilateral ankle edema, left greater than right. Neurologic examination: Grossly nonfocal.   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 I plan on a two-week course of palliative radiation therapy to his weight-bearing left  tibia. I plan to review his bone scan after receiving the disc from [facility]. We discussed the potential acute and late toxicities of radiation therapy which should be well tolerated. Consent is signed today.   PLAN: As discussed above. He'll return for simulation/treatment planning tomorrow.  I spent 40 minutes minutes face to face with the patient and more than 50% of that time was spent in counseling and/or coordination of care.

## 2014-03-28 ENCOUNTER — Ambulatory Visit
Admission: RE | Admit: 2014-03-28 | Discharge: 2014-03-28 | Disposition: A | Discharge status: Home / Self Care | Payer: Medicare Other | Source: Ambulatory Visit | Attending: Radiation Oncology | Admitting: Radiation Oncology

## 2014-03-28 DIAGNOSIS — K219 Gastro-esophageal reflux disease without esophagitis: Secondary | ICD-10-CM | POA: Diagnosis not present

## 2014-03-28 DIAGNOSIS — C7952 Secondary malignant neoplasm of bone marrow: Principal | ICD-10-CM

## 2014-03-28 DIAGNOSIS — Z79899 Other long term (current) drug therapy: Secondary | ICD-10-CM | POA: Diagnosis not present

## 2014-03-28 DIAGNOSIS — C7951 Secondary malignant neoplasm of bone: Secondary | ICD-10-CM | POA: Diagnosis not present

## 2014-03-28 DIAGNOSIS — C61 Malignant neoplasm of prostate: Secondary | ICD-10-CM | POA: Diagnosis not present

## 2014-03-28 DIAGNOSIS — E119 Type 2 diabetes mellitus without complications: Secondary | ICD-10-CM | POA: Diagnosis not present

## 2014-03-28 DIAGNOSIS — Z510 Encounter for antineoplastic radiation therapy: Secondary | ICD-10-CM | POA: Diagnosis not present

## 2014-03-28 DIAGNOSIS — I10 Essential (primary) hypertension: Secondary | ICD-10-CM | POA: Diagnosis not present

## 2014-03-28 NOTE — Progress Notes (Signed)
Complex simulation/treatment planning note: The patient underwent complex simulation/treatment planning in the management of his metastatic carcinoma the prostate to bone. He was taken to the CT simulator and placed supine. A Vac lock immobilization device was constructed. He was then scanned. I chose and isocenter along the mid to distal left tibia. The CT data set was sent to the planning system I contoured his metastatic disease. He was set up to AP and PA fields. 2 separate multileaf collimators were designed to conform the field. I am prescribing 3000 cGy in 10 sessions utilizing 6 MV photons. An isodose plan is requested.

## 2014-03-31 ENCOUNTER — Other Ambulatory Visit: Payer: Self-pay | Admitting: Internal Medicine

## 2014-04-01 ENCOUNTER — Encounter: Payer: Self-pay | Admitting: Radiation Oncology

## 2014-04-01 DIAGNOSIS — C7951 Secondary malignant neoplasm of bone: Secondary | ICD-10-CM | POA: Diagnosis not present

## 2014-04-01 DIAGNOSIS — C7952 Secondary malignant neoplasm of bone marrow: Secondary | ICD-10-CM | POA: Diagnosis not present

## 2014-04-01 DIAGNOSIS — Z510 Encounter for antineoplastic radiation therapy: Secondary | ICD-10-CM | POA: Diagnosis not present

## 2014-04-01 DIAGNOSIS — C61 Malignant neoplasm of prostate: Secondary | ICD-10-CM | POA: Diagnosis not present

## 2014-04-01 NOTE — Progress Notes (Signed)
Treatment planning note: The patient completed his treatment planning today. There are 3 complex treatment devices, 2 for the PA and 1 for the AP with a PA having a field and field. He is be treated with 6 and 15 MV photons. I chose the 100% isodose line for the prescribed 3000 cGy in 10 sessions.

## 2014-04-03 ENCOUNTER — Ambulatory Visit
Admission: RE | Admit: 2014-04-03 | Discharge: 2014-04-03 | Disposition: A | Discharge status: Home / Self Care | Payer: Medicare Other | Source: Ambulatory Visit | Attending: Radiation Oncology | Admitting: Radiation Oncology

## 2014-04-03 DIAGNOSIS — C7951 Secondary malignant neoplasm of bone: Secondary | ICD-10-CM

## 2014-04-03 DIAGNOSIS — C7952 Secondary malignant neoplasm of bone marrow: Secondary | ICD-10-CM

## 2014-04-03 DIAGNOSIS — C7650 Malignant neoplasm of unspecified lower limb: Secondary | ICD-10-CM

## 2014-04-03 DIAGNOSIS — C61 Malignant neoplasm of prostate: Secondary | ICD-10-CM | POA: Diagnosis not present

## 2014-04-03 DIAGNOSIS — Z510 Encounter for antineoplastic radiation therapy: Secondary | ICD-10-CM | POA: Diagnosis not present

## 2014-04-03 NOTE — Progress Notes (Signed)
Simulation verification note: The patient underwent similar to verification for treatment to his left mid to lower tibia. His isocenter is in good position and the multileaf collimators contoured the treatment volume appropriately.

## 2014-04-04 ENCOUNTER — Ambulatory Visit
Admission: RE | Admit: 2014-04-04 | Discharge: 2014-04-04 | Disposition: A | Discharge status: Home / Self Care | Payer: Medicare Other | Source: Ambulatory Visit | Attending: Radiation Oncology | Admitting: Radiation Oncology

## 2014-04-04 DIAGNOSIS — C7952 Secondary malignant neoplasm of bone marrow: Secondary | ICD-10-CM | POA: Diagnosis not present

## 2014-04-04 DIAGNOSIS — C7951 Secondary malignant neoplasm of bone: Secondary | ICD-10-CM

## 2014-04-04 DIAGNOSIS — C61 Malignant neoplasm of prostate: Secondary | ICD-10-CM | POA: Diagnosis not present

## 2014-04-04 DIAGNOSIS — Z510 Encounter for antineoplastic radiation therapy: Secondary | ICD-10-CM | POA: Diagnosis not present

## 2014-04-04 MED ORDER — RADIAPLEXRX EX GEL
Freq: Once | CUTANEOUS | Status: AC
  Administered 2014-04-04: 10:00:00 via TOPICAL

## 2014-04-04 NOTE — Progress Notes (Signed)
pateint education done, rad book, radiaplex gel given, skin irritation, fatigue,pain, increase protein in diet, apple cream after rad tx and bedtime, sees MD weekly/prn, all questions answered,teach back 9:44 AM

## 2014-04-05 ENCOUNTER — Ambulatory Visit
Admission: RE | Admit: 2014-04-05 | Discharge: 2014-04-05 | Disposition: A | Discharge status: Home / Self Care | Payer: Medicare Other | Source: Ambulatory Visit | Attending: Radiation Oncology | Admitting: Radiation Oncology

## 2014-04-05 ENCOUNTER — Encounter: Payer: Self-pay | Admitting: Radiation Oncology

## 2014-04-05 VITALS — BP 125/74 | HR 62 | Temp 97.8°F | Ht 71.0 in | Wt 256.1 lb

## 2014-04-05 DIAGNOSIS — C61 Malignant neoplasm of prostate: Secondary | ICD-10-CM

## 2014-04-05 DIAGNOSIS — C7952 Secondary malignant neoplasm of bone marrow: Secondary | ICD-10-CM

## 2014-04-05 DIAGNOSIS — C7650 Malignant neoplasm of unspecified lower limb: Secondary | ICD-10-CM

## 2014-04-05 DIAGNOSIS — C7951 Secondary malignant neoplasm of bone: Secondary | ICD-10-CM

## 2014-04-05 DIAGNOSIS — Z510 Encounter for antineoplastic radiation therapy: Secondary | ICD-10-CM | POA: Diagnosis not present

## 2014-04-05 NOTE — Progress Notes (Signed)
  [facility]         [phone] ________________________________  Name: Jeremiah Collins MRN: [id number]  Date: 04/05/2014  DOB: 04-22-1934  Weekly Radiation Therapy Management  Current Dose: 9 Gy     Planned Dose:  30 Gy  Narrative . . . . . . . . The patient presents for routine under treatment assessment.                                   The patient has had 3 fractions to his left tibia. He is reporting severe pain in his left lower leg from his knee to his ankle when standing. He reports it is a 7/10 when he is sitting. He is currently taking 2 tablets of percocet 5/325 mg q 6 hours. He said it helps a little. He reports only being able to ambulate with crutches. He has only been getting up to go to the bathroom. Pitting edema noted in his left leg. He said the swelling has been going on for a while. He is taking lasix 20 mg daily. He reports a poor appetite and has lost 7 lb since 03/27/14. He is not interested in seeing the dietician at this point                                 Set-up films were reviewed.                                 The chart was checked. Physical Findings. . .  height is 5\' 11"  (1.803 m) and weight is 256 lb 1.6 oz (116.166 kg). His oral temperature is 97.8 F (36.6 C). His blood pressure is 125/74 and his pulse is 62. His oxygen saturation is 97%. . Weight essentially stable.  No significant changes. Impression . . . . . . . The patient is tolerating radiation.  Plan . . . . . . . . . . . . I offered the patient more pain medicine, but, in talking and understanding that the pain should respond to radiation, he declined pain meds.  Continue treatment as planned.  ________________________________  Sheral Apley. Tammi Klippel, M.D.

## 2014-04-05 NOTE — Progress Notes (Addendum)
Jeremiah Collins has had 3 fractions to his left tibia.  He is reporting severe pain in his left lower leg from his knee to his ankle when standing.  He reports it is a 7/10 when he is sitting.  He is currently taking 2 tablets of percocet 5/325 mg q 6 hours. He said it helps a little.  He reports only being able to ambulate with crutches.  He has only been getting up to go to the bathroom.  Pitting edema noted in his left leg.  He said the swelling has been going on for a while.  He is taking lasix 20 mg daily.  He reports a poor appetite and has lost 7 lb since 03/27/14.  He is not interested in seeing the dietician at this point.

## 2014-04-09 ENCOUNTER — Ambulatory Visit
Admission: RE | Admit: 2014-04-09 | Discharge: 2014-04-09 | Disposition: A | Discharge status: Home / Self Care | Payer: Medicare Other | Source: Ambulatory Visit | Attending: Radiation Oncology | Admitting: Radiation Oncology

## 2014-04-09 ENCOUNTER — Other Ambulatory Visit: Payer: Self-pay | Admitting: Radiation Oncology

## 2014-04-09 ENCOUNTER — Encounter: Payer: Self-pay | Admitting: Radiation Oncology

## 2014-04-09 VITALS — BP 120/73 | HR 54 | Temp 97.5°F | Resp 20

## 2014-04-09 DIAGNOSIS — C7952 Secondary malignant neoplasm of bone marrow: Principal | ICD-10-CM

## 2014-04-09 DIAGNOSIS — Z510 Encounter for antineoplastic radiation therapy: Secondary | ICD-10-CM | POA: Diagnosis not present

## 2014-04-09 DIAGNOSIS — C7951 Secondary malignant neoplasm of bone: Secondary | ICD-10-CM

## 2014-04-09 DIAGNOSIS — C61 Malignant neoplasm of prostate: Secondary | ICD-10-CM | POA: Diagnosis not present

## 2014-04-09 MED ORDER — OXYCODONE-ACETAMINOPHEN 5-325 MG PO TABS: 2.0000 | ORAL_TABLET | Freq: Four times a day (QID) | ORAL | Status: DC | PRN

## 2014-04-09 NOTE — Progress Notes (Addendum)
Pt states his pain in his left tibia is not improved. He continues to take Oxycodone 5-325, 2 tabs three times a day. He is aware he may take up to every 6 hours. Pt states his pain is worse with weight bearing and at times when he lifts his left foot. He continues to have swelling of BLE; he states "it is the same". He will see his PCP in July and will discuss the dose of his Lasix. Discussed methods to reduce swelling. Pt denies fatigue, loss of appetite.

## 2014-04-09 NOTE — Progress Notes (Signed)
Weekly Management Note:  Site: Mid to distal left tibia Current Dose:  1200  cGy Projected Dose: 3000  cGy  Narrative: The patient is seen today for routine under treatment assessment. CBCT/MVCT images/port films were reviewed. The chart was reviewed.   He continues to have significant pain along his mid to distal left tibia. He takes Percocet (5/325) up to 6 pills a day. He does have issues with constipation and is on a stool softener. He had a bowel movement this morning.  Physical Examination:  Filed Vitals:   04/09/14 0907  BP: 120/73  Pulse: 54  Temp: 97.5 F (36.4 C)  Resp: 20  .  Weight:  . There is palpable discomfort along the mid to distal left tibia. There is 1+ ankle/foot edema.  Impression: Tolerating radiation therapy well.  Plan: Continue radiation therapy as planned.

## 2014-04-10 ENCOUNTER — Ambulatory Visit
Admission: RE | Admit: 2014-04-10 | Discharge: 2014-04-10 | Disposition: A | Discharge status: Home / Self Care | Payer: Medicare Other | Source: Ambulatory Visit | Attending: Radiation Oncology | Admitting: Radiation Oncology

## 2014-04-10 DIAGNOSIS — C61 Malignant neoplasm of prostate: Secondary | ICD-10-CM | POA: Diagnosis not present

## 2014-04-10 DIAGNOSIS — C7951 Secondary malignant neoplasm of bone: Secondary | ICD-10-CM | POA: Diagnosis not present

## 2014-04-10 DIAGNOSIS — Z510 Encounter for antineoplastic radiation therapy: Secondary | ICD-10-CM | POA: Diagnosis not present

## 2014-04-11 ENCOUNTER — Ambulatory Visit
Admission: RE | Admit: 2014-04-11 | Discharge: 2014-04-11 | Disposition: A | Discharge status: Home / Self Care | Payer: Medicare Other | Source: Ambulatory Visit | Attending: Radiation Oncology | Admitting: Radiation Oncology

## 2014-04-11 DIAGNOSIS — C61 Malignant neoplasm of prostate: Secondary | ICD-10-CM | POA: Diagnosis not present

## 2014-04-11 DIAGNOSIS — Z510 Encounter for antineoplastic radiation therapy: Secondary | ICD-10-CM | POA: Diagnosis not present

## 2014-04-11 DIAGNOSIS — C7951 Secondary malignant neoplasm of bone: Secondary | ICD-10-CM | POA: Diagnosis not present

## 2014-04-12 ENCOUNTER — Ambulatory Visit
Admission: RE | Admit: 2014-04-12 | Discharge: 2014-04-12 | Disposition: A | Discharge status: Home / Self Care | Payer: Medicare Other | Source: Ambulatory Visit | Attending: Radiation Oncology | Admitting: Radiation Oncology

## 2014-04-12 DIAGNOSIS — C7951 Secondary malignant neoplasm of bone: Secondary | ICD-10-CM | POA: Diagnosis not present

## 2014-04-12 DIAGNOSIS — C61 Malignant neoplasm of prostate: Secondary | ICD-10-CM | POA: Diagnosis not present

## 2014-04-12 DIAGNOSIS — Z510 Encounter for antineoplastic radiation therapy: Secondary | ICD-10-CM | POA: Diagnosis not present

## 2014-04-15 ENCOUNTER — Ambulatory Visit
Admission: RE | Admit: 2014-04-15 | Discharge: 2014-04-15 | Disposition: A | Discharge status: Home / Self Care | Payer: Medicare Other | Source: Ambulatory Visit | Attending: Radiation Oncology | Admitting: Radiation Oncology

## 2014-04-15 VITALS — BP 117/56 | HR 61 | Temp 97.8°F | Resp 20 | Wt 250.4 lb

## 2014-04-15 DIAGNOSIS — C7951 Secondary malignant neoplasm of bone: Secondary | ICD-10-CM | POA: Diagnosis not present

## 2014-04-15 DIAGNOSIS — C61 Malignant neoplasm of prostate: Secondary | ICD-10-CM | POA: Diagnosis not present

## 2014-04-15 DIAGNOSIS — C7952 Secondary malignant neoplasm of bone marrow: Secondary | ICD-10-CM | POA: Diagnosis not present

## 2014-04-15 DIAGNOSIS — Z510 Encounter for antineoplastic radiation therapy: Secondary | ICD-10-CM | POA: Diagnosis not present

## 2014-04-15 DIAGNOSIS — C7650 Malignant neoplasm of unspecified lower limb: Secondary | ICD-10-CM

## 2014-04-15 NOTE — Progress Notes (Signed)
Weekly Management Note:  Site: Left tibia Current Dose:  2400  cGy Projected Dose: 3000  cGy  Narrative: The patient is seen today for routine under treatment assessment. CBCT/MVCT images/port films were reviewed. The chart was reviewed.   He continues with oxycodone for persistent pain. Pain is worse with weightbearing. No significant pain at rest. No significant improvement today.  Physical Examination:  Filed Vitals:   04/15/14 0922  BP: 117/56  Pulse: 61  Temp: 97.8 F (36.6 C)  Resp: 20  .  Weight: 250 lb 6.4 oz (113.581 kg). There is 2+ left ankle edema. No palpable calf or popliteal pain to suggest deep venous thrombosis. Right ankle is also swelling.  Impression: Tolerating radiation therapy well, however, there has been no significant palliation so far. I suspect that his a component of his pain is secondary to loss of structural integrity of the bone. He'll finish his radiation therapy in Wednesday.  Plan: Continue radiation therapy as planned. One-month followup visit after completion of radiation therapy this Wednesday.

## 2014-04-15 NOTE — Progress Notes (Signed)
Pt states he does not feel his left tibial pain is improved. He continues to take Oxycodone 2 tabs three times a day, states he does not want to take 4 times a day. He states his pain is at it's worst with weight bearing, has mild pain when sitting. he states he "feels bad" when he takes Oxycodone, but he cannot describe this feeling. He states he has had some nausea.  His left LE remains swollen today. Pt has loss of appetite, fatigue.

## 2014-04-16 ENCOUNTER — Ambulatory Visit
Admission: RE | Admit: 2014-04-16 | Discharge: 2014-04-16 | Disposition: A | Discharge status: Home / Self Care | Payer: Medicare Other | Source: Ambulatory Visit | Attending: Radiation Oncology | Admitting: Radiation Oncology

## 2014-04-16 DIAGNOSIS — Z510 Encounter for antineoplastic radiation therapy: Secondary | ICD-10-CM | POA: Diagnosis not present

## 2014-04-16 DIAGNOSIS — C61 Malignant neoplasm of prostate: Secondary | ICD-10-CM | POA: Diagnosis not present

## 2014-04-16 DIAGNOSIS — C7951 Secondary malignant neoplasm of bone: Secondary | ICD-10-CM | POA: Diagnosis not present

## 2014-04-16 DIAGNOSIS — C7952 Secondary malignant neoplasm of bone marrow: Secondary | ICD-10-CM | POA: Diagnosis not present

## 2014-04-17 ENCOUNTER — Ambulatory Visit
Admission: RE | Admit: 2014-04-17 | Discharge: 2014-04-17 | Disposition: A | Discharge status: Home / Self Care | Payer: Medicare Other | Source: Ambulatory Visit | Attending: Radiation Oncology | Admitting: Radiation Oncology

## 2014-04-17 ENCOUNTER — Encounter: Payer: Self-pay | Admitting: Radiation Oncology

## 2014-04-17 DIAGNOSIS — C61 Malignant neoplasm of prostate: Secondary | ICD-10-CM | POA: Diagnosis not present

## 2014-04-17 DIAGNOSIS — C7951 Secondary malignant neoplasm of bone: Secondary | ICD-10-CM | POA: Diagnosis not present

## 2014-04-17 DIAGNOSIS — Z510 Encounter for antineoplastic radiation therapy: Secondary | ICD-10-CM | POA: Diagnosis not present

## 2014-04-17 NOTE — Progress Notes (Signed)
[facility] End of Treatment Note  Name:Jeremiah Collins  Date: 04/17/2014 JHE:[id number] DOB:October 04, 1934   Status:outpatient    CC: Blanchie Serve, MD  Dr. Tobey Bride  REFERRING PHYSICIAN:  Dr. 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                 NARRATIVE:   [name] tolerated treatment well with slight improvement of his left tibia pain by completion of therapy. He still takes up to 4 hydrocodone/APAP tabs a day. I suggested to him that he try ibuprofen when necessary for pain.                         PLAN: Routine followup in one month. Patient instructed to call if questions or worsening complaints in interim.

## 2014-04-23 DIAGNOSIS — C7951 Secondary malignant neoplasm of bone: Secondary | ICD-10-CM | POA: Diagnosis not present

## 2014-04-23 DIAGNOSIS — C61 Malignant neoplasm of prostate: Secondary | ICD-10-CM | POA: Diagnosis not present

## 2014-04-29 ENCOUNTER — Other Ambulatory Visit: Payer: Self-pay | Admitting: Internal Medicine

## 2014-05-14 ENCOUNTER — Encounter: Payer: Self-pay | Admitting: *Deleted

## 2014-05-21 ENCOUNTER — Telehealth: Payer: Self-pay | Admitting: Dietician

## 2014-05-21 NOTE — Telephone Encounter (Signed)
Brief Outpatient Oncology Nutrition Note  Patient has been identified to be at risk on malnutrition screen.  Wt Readings from Last 10 Encounters:  04/15/14 250 lb 6.4 oz (113.581 kg)  04/05/14 256 lb 1.6 oz (116.166 kg)  03/27/14 263 lb (119.296 kg)  03/12/14 263 lb 6.4 oz (119.477 kg)  03/06/14 267 lb 6.4 oz (121.292 kg)  03/04/14 269 lb 6.4 oz (122.199 kg)  02/05/14 268 lb 6.4 oz (121.745 kg)  01/23/14 264 lb 6.4 oz (119.931 kg)  12/18/13 264 lb 3.2 oz (119.84 kg)  11/06/13 263 lb (119.296 kg)    Dx:  Secondary malignant neoplasm of bone and bone marrow.  Patient of Dr. Bronaugh Lions.  Called patient due to weigh loss.  Patient reports good appetite and very good intake currently but poor prior secondary to very poor appetite on medication.  Medication has since been discontinued.  Patient to call for any nutrition related question.  Antonieta Iba, RD, LDN

## 2014-05-22 ENCOUNTER — Encounter: Payer: Self-pay | Admitting: Radiation Oncology

## 2014-05-22 ENCOUNTER — Ambulatory Visit
Admission: RE | Admit: 2014-05-22 | Discharge: 2014-05-22 | Disposition: A | Discharge status: Home / Self Care | Payer: Medicare Other | Source: Ambulatory Visit | Attending: Radiation Oncology | Admitting: Radiation Oncology

## 2014-05-22 VITALS — BP 121/61 | HR 73 | Resp 16 | Wt 245.5 lb

## 2014-05-22 DIAGNOSIS — C7952 Secondary malignant neoplasm of bone marrow: Principal | ICD-10-CM

## 2014-05-22 DIAGNOSIS — C7951 Secondary malignant neoplasm of bone: Secondary | ICD-10-CM

## 2014-05-22 NOTE — Progress Notes (Signed)
CC: Dr. Tobey Collins   Jeremiah Collins visits today approximately 1 month following completion of palliative radiotherapy to his left tibia in the management of his metastatic carcinoma of prostate to bone. He states that he still having mid to distal left tibia discomfort, but he no longer takes hydrocodone or Percocet. I previously asked him to try ibuprofen, but he took 400 mg once a did not continue this. He continues with androgen deprivation therapy through Dr. Lawerance Collins. He's not sure when he is scheduled for his next injection. He tells me that his most recent PSA was 0.11 down from a value of 10.  Physical examination: Alert and oriented. Filed Vitals:   05/22/14 1105  BP: 121/61  Pulse: 73  Resp: 16   On examination of the left lower extremity there is discomfort localized to the mid to distal left tibia. He does have 2+ bilateral ankle/foot edema.  Impression: Jeremiah Collins he has not had satisfactory palliation today. I instructed him to take 1 tablet of Aleve two to 3 times a day. She will call me back next week. I instructed him to contact Dr. Lawerance Collins to see when he is scheduled for his next Lupron injection. Of note is that I did review his outside bone scan and his treatment fields, and the distal tibia was treated in its entirety. It may take 1-2 more months before he has satisfactory palliation.  Plan: He'll contact me regarding his progress next week.

## 2014-05-22 NOTE — Progress Notes (Signed)
Denies dysuria or hematuria. Reports nocturia x3. Reports a weak urine stream. Wears pads to catch urine "dribble." Reports regular formed bowel movements. Denies napping during the day and confirms he does as much as he still can. Reports his ability to walk has diminished and pain in his left leg increased. Denies taking any pain medication. States, "I was prescribed percocet 5/325 but, it doesn't help." Reports using crutches to get around. Edema of left foot and leg noted. Denies numbness or tingling in left leg. Reports his appetite diminished with Zytiga thus nausea and vomiting sparked weight loss he hasn't recovered from yet. Denies recent falls.

## 2014-05-27 ENCOUNTER — Other Ambulatory Visit: Payer: Medicare Other

## 2014-05-27 DIAGNOSIS — E663 Overweight: Secondary | ICD-10-CM | POA: Diagnosis not present

## 2014-05-27 DIAGNOSIS — I10 Essential (primary) hypertension: Secondary | ICD-10-CM

## 2014-05-27 DIAGNOSIS — E119 Type 2 diabetes mellitus without complications: Secondary | ICD-10-CM

## 2014-05-28 LAB — LIPID PANEL
CHOL/HDL RATIO: 4.9 ratio (ref 0.0–5.0)
Cholesterol, Total: 197 mg/dL (ref 100–199)
HDL: 40 mg/dL (ref >39)
LDL CALC: 133 mg/dL — AB (ref 0–99)
Triglycerides: 118 mg/dL (ref 0–149)
VLDL Cholesterol Cal: 24 mg/dL (ref 5–40)

## 2014-05-28 LAB — COMPREHENSIVE METABOLIC PANEL
A/G RATIO: 1.5 (ref 1.1–2.5)
ALT: 9 IU/L (ref 0–44)
AST: 13 IU/L (ref 0–40)
Albumin: 3.6 g/dL (ref 3.5–4.8)
Alkaline Phosphatase: 87 IU/L (ref 39–117)
BILIRUBIN TOTAL: 0.3 mg/dL (ref 0.0–1.2)
BUN / CREAT RATIO: 26 — AB (ref 10–22)
BUN: 32 mg/dL — ABNORMAL HIGH (ref 8–27)
CO2: 20 mmol/L (ref 18–29)
Calcium: 9.3 mg/dL (ref 8.6–10.2)
Chloride: 103 mmol/L (ref 97–108)
Creatinine, Ser: 1.22 mg/dL (ref 0.76–1.27)
GFR, EST AFRICAN AMERICAN: 66 mL/min/{1.73_m2} (ref >59)
GFR, EST NON AFRICAN AMERICAN: 57 mL/min/{1.73_m2} — AB (ref >59)
Globulin, Total: 2.4 g/dL (ref 1.5–4.5)
Glucose: 119 mg/dL — ABNORMAL HIGH (ref 65–99)
POTASSIUM: 4.1 mmol/L (ref 3.5–5.2)
Sodium: 142 mmol/L (ref 134–144)
Total Protein: 6 g/dL (ref 6.0–8.5)

## 2014-05-28 LAB — HEMOGLOBIN A1C
Est. average glucose Bld gHb Est-mCnc: 143 mg/dL
HEMOGLOBIN A1C: 6.6 % — AB (ref 4.8–5.6)

## 2014-05-29 ENCOUNTER — Encounter: Payer: Self-pay | Admitting: Internal Medicine

## 2014-05-29 ENCOUNTER — Ambulatory Visit (INDEPENDENT_AMBULATORY_CARE_PROVIDER_SITE_OTHER): Payer: Medicare Other | Admitting: Internal Medicine

## 2014-05-29 VITALS — BP 132/76 | HR 68 | Temp 97.8°F | Wt 254.4 lb

## 2014-05-29 DIAGNOSIS — E1129 Type 2 diabetes mellitus with other diabetic kidney complication: Secondary | ICD-10-CM | POA: Diagnosis not present

## 2014-05-29 DIAGNOSIS — E1149 Type 2 diabetes mellitus with other diabetic neurological complication: Secondary | ICD-10-CM

## 2014-05-29 DIAGNOSIS — M898X9 Other specified disorders of bone, unspecified site: Secondary | ICD-10-CM | POA: Insufficient documentation

## 2014-05-29 DIAGNOSIS — E1142 Type 2 diabetes mellitus with diabetic polyneuropathy: Secondary | ICD-10-CM | POA: Insufficient documentation

## 2014-05-29 DIAGNOSIS — E1165 Type 2 diabetes mellitus with hyperglycemia: Secondary | ICD-10-CM

## 2014-05-29 DIAGNOSIS — I10 Essential (primary) hypertension: Secondary | ICD-10-CM | POA: Diagnosis not present

## 2014-05-29 DIAGNOSIS — J438 Other emphysema: Secondary | ICD-10-CM

## 2014-05-29 DIAGNOSIS — M899 Disorder of bone, unspecified: Secondary | ICD-10-CM

## 2014-05-29 DIAGNOSIS — E785 Hyperlipidemia, unspecified: Secondary | ICD-10-CM

## 2014-05-29 DIAGNOSIS — M949 Disorder of cartilage, unspecified: Secondary | ICD-10-CM

## 2014-05-29 DIAGNOSIS — C7951 Secondary malignant neoplasm of bone: Secondary | ICD-10-CM

## 2014-05-29 DIAGNOSIS — IMO0002 Reserved for concepts with insufficient information to code with codable children: Secondary | ICD-10-CM | POA: Insufficient documentation

## 2014-05-29 DIAGNOSIS — C7952 Secondary malignant neoplasm of bone marrow: Secondary | ICD-10-CM

## 2014-05-29 DIAGNOSIS — J439 Emphysema, unspecified: Secondary | ICD-10-CM

## 2014-05-29 NOTE — Progress Notes (Signed)
Patient ID: Jeremiah Collins, male   DOB: [birth year], 80 y.o.   MRN: [id number]    Chief Complaint  Patient presents with  . Follow-up    4 month f/u & discuss labs (printed)  . Leg Problem    persists  . other    wants Handicap placard    No Known Allergies  HPI 80 y/o male patient is here for follow up. He has prostate cancer with metastases to the bones and has completed 10 cycles of radiation. He continues to have left leg pain. He was taking aleve 3 times a day with no improvement.Using crutches to walk around. Movement worsens his pain cbg at home reviewed- 108-149 He does not want to take narcotics or any other pain medications Last seen eye doctor several years back Taking his metformin  Review of Systems  Constitutional: Negative for fever, chills, diaphoresis.  HENT: Negative for congestion, hearing loss and sore throat.   Eyes: Negative for blurred vision, double vision and discharge.  Respiratory: Negative for cough, sputum production, shortness of breath and wheezing.   Cardiovascular: Negative for chest pain, palpitations, orthopnea and leg swelling.  Gastrointestinal: Negative for heartburn, nausea, vomiting, abdominal pain, diarrhea and constipation.  Genitourinary: Negative for dysuria Musculoskeletal: Negative for back pain, falls Skin: Negative for itching and rash.  Neurological: Negative for dizziness, tingling Psychiatric/Behavioral: Negative for depression and memory loss. The patient is not nervous/anxious.    Past Medical History  Diagnosis Date  . HTN (hypertension)   . Other and unspecified hyperlipidemia 01/02/2013  . Routine general medical examination at a health care facility   . Acute bronchitis 09/12/2012  . Cellulitis and abscess of leg, except foot 01/17/2012  . Gross hematuria 09/06/2011  . Osteoarthrosis involving, or with mention of more than one site, but not specified as generalized, multiple sites 10/22/2010  . Reflux esophagitis  05/10/2008  . First degree atrioventricular block 04/25/2007  . 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 Palpitations 04/28/2004  . Chest pain, unspecified 04/28/2004  . Closed fracture of five ribs 03/24/2004  . Spinal stenosis, unspecified region other than cervical 04/29/1995  . Other abnormal blood chemistry 04/29/1991  . Cataract     Dr.Groat  . Type II or unspecified type diabetes mellitus without mention of complication, uncontrolled   . Prostate cancer 12/16/1994    gleason 7  . Hx of radiation therapy 11/1998    prostate fossa - 6040 cGy, 33 fx, Dr Danny Lawless  . Bone metastases     left mid tibial shaft  . History of radiation therapy 04/03/14- 04/17/14    mid to distal left tibia 3000 cGy in 10 sessions   Current Outpatient Prescriptions on File Prior to Visit  Medication Sig Dispense Refill  . albuterol (PROVENTIL HFA;VENTOLIN HFA) 108 (90 BASE) MCG/ACT inhaler Inhale 2 puffs into the lungs 3 (three) times daily between meals as needed for wheezing or shortness of breath.  8.5 g  1  . cholecalciferol (VITAMIN D) 400 UNITS TABS tablet Take 1 tablet (400 Units total) by mouth daily. Take 2 tablets daily  60 each  0  . doxazosin (CARDURA) 4 MG tablet TAKE ONE TABLET BY MOUTH ONCE DAILY  30 tablet  0  . furosemide (LASIX) 20 MG tablet TAKE ONE TABLET BY MOUTH ONCE DAILY  30 tablet  2  . glucose blood (ONETOUCH VERIO) test strip Check blood sugar 1-2 times weekly.  DX: 250.00  100 each  11  . hyaluronate sodium (RADIAPLEXRX) GEL Apply 1  application topically 2 (two) times daily. Apply to leg treated area after rad tx and bedtime, and on weekends      . lisinopril-hydrochlorothiazide (PRINZIDE,ZESTORETIC) 10-12.5 MG per tablet Take 1 tablet by mouth daily.  90 tablet  3  . metFORMIN (GLUCOPHAGE) 500 MG tablet Take 1 tablet (500 mg total) by mouth daily with breakfast.  90 tablet  3  . metoprolol tartrate (LOPRESSOR) 25 MG tablet Take 0.5 tablets (12.5 mg total) by mouth 2 (two) times daily.  60  tablet  3  . Omega-3 Fatty Acids (FISH OIL) 1000 MG CAPS Take 1 capsule (1,000 mg total) by mouth daily.  90 capsule  3  . ONETOUCH DELICA LANCETS 84X MISC Check blood sugar 1 time weekly.   DX: 250.00      . predniSONE (DELTASONE) 5 MG tablet 1 po bid       No current facility-administered medications on file prior to visit.    Physical exam BP 132/76  Pulse 68  Temp(Src) 97.8 F (36.6 C) (Oral)  Wt 254 lb 6.4 oz (115.395 kg)  SpO2 97%  Constitutional: elderly male in no acute distress, obese Lungs: b/l clear to auscultation, no wheeze or rhonchi or crackles Cvs: Normal rate, regular rhythm, normal heart sounds and intact distal pulses. Musculoskeletal: left leg edema > right leg edema, tenderness in anterior leg area, no skin changes, erythema resolved,hair loss in the leg area Neurological: He is alert and oriented to person, place, and time.  normal fine prick sensation and reduced vibration in right foot upto ankle. Normal sensory exam in left foot. Good distal pulses Skin: dry, warm, normal capillary refill   Lab Results  Component Value Date   CREATININE 1.22 05/27/2014    Lab Results  Component Value Date   HGBA1C 6.6* 05/27/2014   Lipid Panel     Component Value Date/Time   TRIG 118 05/27/2014 0914   HDL 40 05/27/2014 0914   CHOLHDL 4.9 05/27/2014 0914   LDLCALC 133* 05/27/2014 0914   Wt Readings from Last 3 Encounters:  05/29/14 254 lb 6.4 oz (115.395 kg)  05/22/14 245 lb 8 oz (111.358 kg)  04/15/14 250 lb 6.4 oz (113.581 kg)   Assessment/plan  1. Essential hypertension, benign Continue lasix, prinzide, lopressor and doxazosin for now  2. DM type 2, uncontrolled, with renal complications Check urine mciroalbumin. Referral to ophthlamology provided. Monitor cbg and a1c. uptodate with foot exam. Reviewed lipid- does not want medication - Ambulatory referral to Ophthalmology - Microalbumin/Creatinine Ratio, Urine  3. DM type 2 with diabetic peripheral  neuropathy Reviewed a1c. Decreased vibration upto ankle in right foot. Rest exam normal. Continue metformin for now - Hemoglobin A1c; Future - CMP; Future  4. Pulmonary emphysema, unspecified emphysema type Taking prn bronchodilator. Refuses any further workup at present  5. Secondary malignant neoplasm of bone and bone marrow S/p radiation and to follow with urology and oncology. Continue his current prednisone regimen  6. Hyperlipidemia Reviewed result and ldl goal with patient. Pt not willing to start any new meds at present. Reviewed outcome. Will readdress it next visit. He has goal ldl < 100. Continue fish oil  7. Bone pain Persists, likely from his bone mets. Other causes of fracture, PAD and PVD ruled out in past. Pt does not want any other pain meds/ narcotics at present. Advised not to take NSAIDs with worsening kidney function. Pt voices understanding this

## 2014-05-30 LAB — MICROALBUMIN / CREATININE URINE RATIO
CREATININE UR: 63.3 mg/dL (ref 22.0–328.0)
MICROALB/CREAT RATIO: 74.1 mg/g{creat} — AB (ref 0.0–30.0)
Microalbumin, Urine: 46.9 ug/mL — ABNORMAL HIGH (ref 0.0–17.0)

## 2014-06-02 ENCOUNTER — Other Ambulatory Visit: Payer: Self-pay | Admitting: Internal Medicine

## 2014-06-03 DIAGNOSIS — E119 Type 2 diabetes mellitus without complications: Secondary | ICD-10-CM | POA: Diagnosis not present

## 2014-06-03 LAB — HM DIABETES EYE EXAM

## 2014-06-04 DIAGNOSIS — IMO0002 Reserved for concepts with insufficient information to code with codable children: Secondary | ICD-10-CM | POA: Diagnosis not present

## 2014-06-04 DIAGNOSIS — E119 Type 2 diabetes mellitus without complications: Secondary | ICD-10-CM | POA: Diagnosis not present

## 2014-06-04 DIAGNOSIS — M84469D Pathological fracture, unspecified tibia and fibula, subsequent encounter for fracture with routine healing: Secondary | ICD-10-CM | POA: Diagnosis not present

## 2014-06-04 DIAGNOSIS — D500 Iron deficiency anemia secondary to blood loss (chronic): Secondary | ICD-10-CM | POA: Diagnosis present

## 2014-06-04 DIAGNOSIS — Z9079 Acquired absence of other genital organ(s): Secondary | ICD-10-CM | POA: Diagnosis not present

## 2014-06-04 DIAGNOSIS — Z0389 Encounter for observation for other suspected diseases and conditions ruled out: Secondary | ICD-10-CM | POA: Diagnosis not present

## 2014-06-04 DIAGNOSIS — M129 Arthropathy, unspecified: Secondary | ICD-10-CM | POA: Diagnosis present

## 2014-06-04 DIAGNOSIS — Z8042 Family history of malignant neoplasm of prostate: Secondary | ICD-10-CM | POA: Diagnosis not present

## 2014-06-04 DIAGNOSIS — D630 Anemia in neoplastic disease: Secondary | ICD-10-CM | POA: Diagnosis present

## 2014-06-04 DIAGNOSIS — Z8249 Family history of ischemic heart disease and other diseases of the circulatory system: Secondary | ICD-10-CM | POA: Diagnosis not present

## 2014-06-04 DIAGNOSIS — N1330 Unspecified hydronephrosis: Secondary | ICD-10-CM | POA: Diagnosis present

## 2014-06-04 DIAGNOSIS — Z8546 Personal history of malignant neoplasm of prostate: Secondary | ICD-10-CM | POA: Diagnosis not present

## 2014-06-04 DIAGNOSIS — N183 Chronic kidney disease, stage 3 unspecified: Secondary | ICD-10-CM | POA: Diagnosis not present

## 2014-06-04 DIAGNOSIS — C61 Malignant neoplasm of prostate: Secondary | ICD-10-CM | POA: Diagnosis not present

## 2014-06-04 DIAGNOSIS — I129 Hypertensive chronic kidney disease with stage 1 through stage 4 chronic kidney disease, or unspecified chronic kidney disease: Secondary | ICD-10-CM | POA: Diagnosis not present

## 2014-06-04 DIAGNOSIS — N320 Bladder-neck obstruction: Secondary | ICD-10-CM | POA: Diagnosis present

## 2014-06-04 DIAGNOSIS — L659 Nonscarring hair loss, unspecified: Secondary | ICD-10-CM | POA: Diagnosis present

## 2014-06-04 DIAGNOSIS — N390 Urinary tract infection, site not specified: Secondary | ICD-10-CM | POA: Diagnosis present

## 2014-06-04 DIAGNOSIS — Z87891 Personal history of nicotine dependence: Secondary | ICD-10-CM | POA: Diagnosis not present

## 2014-06-04 DIAGNOSIS — I517 Cardiomegaly: Secondary | ICD-10-CM | POA: Diagnosis not present

## 2014-06-04 DIAGNOSIS — Z923 Personal history of irradiation: Secondary | ICD-10-CM | POA: Diagnosis not present

## 2014-06-04 DIAGNOSIS — R339 Retention of urine, unspecified: Secondary | ICD-10-CM | POA: Diagnosis present

## 2014-06-04 DIAGNOSIS — M84469A Pathological fracture, unspecified tibia and fibula, initial encounter for fracture: Secondary | ICD-10-CM | POA: Diagnosis not present

## 2014-06-04 DIAGNOSIS — G8918 Other acute postprocedural pain: Secondary | ICD-10-CM | POA: Diagnosis not present

## 2014-06-04 DIAGNOSIS — Z823 Family history of stroke: Secondary | ICD-10-CM | POA: Diagnosis not present

## 2014-06-04 DIAGNOSIS — K219 Gastro-esophageal reflux disease without esophagitis: Secondary | ICD-10-CM | POA: Diagnosis not present

## 2014-06-04 DIAGNOSIS — N35919 Unspecified urethral stricture, male, unspecified site: Secondary | ICD-10-CM | POA: Diagnosis present

## 2014-06-04 DIAGNOSIS — Z9849 Cataract extraction status, unspecified eye: Secondary | ICD-10-CM | POA: Diagnosis not present

## 2014-06-05 DIAGNOSIS — I517 Cardiomegaly: Secondary | ICD-10-CM | POA: Diagnosis not present

## 2014-06-12 ENCOUNTER — Encounter: Payer: Self-pay | Admitting: *Deleted

## 2014-06-18 DIAGNOSIS — Z466 Encounter for fitting and adjustment of urinary device: Secondary | ICD-10-CM | POA: Diagnosis not present

## 2014-06-18 DIAGNOSIS — C7982 Secondary malignant neoplasm of genital organs: Secondary | ICD-10-CM | POA: Diagnosis not present

## 2014-06-18 DIAGNOSIS — D649 Anemia, unspecified: Secondary | ICD-10-CM | POA: Diagnosis not present

## 2014-06-18 DIAGNOSIS — N183 Chronic kidney disease, stage 3 unspecified: Secondary | ICD-10-CM | POA: Diagnosis not present

## 2014-06-18 DIAGNOSIS — Z8546 Personal history of malignant neoplasm of prostate: Secondary | ICD-10-CM | POA: Diagnosis not present

## 2014-06-18 DIAGNOSIS — E785 Hyperlipidemia, unspecified: Secondary | ICD-10-CM | POA: Diagnosis not present

## 2014-06-18 DIAGNOSIS — R609 Edema, unspecified: Secondary | ICD-10-CM | POA: Diagnosis not present

## 2014-06-18 DIAGNOSIS — N35919 Unspecified urethral stricture, male, unspecified site: Secondary | ICD-10-CM | POA: Diagnosis not present

## 2014-06-18 DIAGNOSIS — R310 Gross hematuria: Secondary | ICD-10-CM | POA: Diagnosis not present

## 2014-06-18 DIAGNOSIS — R319 Hematuria, unspecified: Secondary | ICD-10-CM | POA: Diagnosis not present

## 2014-06-18 DIAGNOSIS — Z85830 Personal history of malignant neoplasm of bone: Secondary | ICD-10-CM | POA: Diagnosis not present

## 2014-06-18 DIAGNOSIS — K219 Gastro-esophageal reflux disease without esophagitis: Secondary | ICD-10-CM | POA: Diagnosis not present

## 2014-06-18 DIAGNOSIS — E119 Type 2 diabetes mellitus without complications: Secondary | ICD-10-CM | POA: Diagnosis not present

## 2014-06-18 DIAGNOSIS — N1330 Unspecified hydronephrosis: Secondary | ICD-10-CM | POA: Diagnosis not present

## 2014-06-18 DIAGNOSIS — I129 Hypertensive chronic kidney disease with stage 1 through stage 4 chronic kidney disease, or unspecified chronic kidney disease: Secondary | ICD-10-CM | POA: Diagnosis not present

## 2014-06-18 DIAGNOSIS — N135 Crossing vessel and stricture of ureter without hydronephrosis: Secondary | ICD-10-CM | POA: Diagnosis not present

## 2014-06-18 DIAGNOSIS — C61 Malignant neoplasm of prostate: Secondary | ICD-10-CM | POA: Diagnosis not present

## 2014-06-25 DIAGNOSIS — Z4789 Encounter for other orthopedic aftercare: Secondary | ICD-10-CM | POA: Diagnosis not present

## 2014-06-25 DIAGNOSIS — Z4802 Encounter for removal of sutures: Secondary | ICD-10-CM | POA: Diagnosis not present

## 2014-07-03 ENCOUNTER — Other Ambulatory Visit: Payer: Self-pay | Admitting: Internal Medicine

## 2014-07-16 DIAGNOSIS — M25473 Effusion, unspecified ankle: Secondary | ICD-10-CM | POA: Diagnosis not present

## 2014-07-16 DIAGNOSIS — M25476 Effusion, unspecified foot: Secondary | ICD-10-CM | POA: Diagnosis not present

## 2014-07-16 DIAGNOSIS — Z4789 Encounter for other orthopedic aftercare: Secondary | ICD-10-CM | POA: Diagnosis not present

## 2014-07-16 DIAGNOSIS — C61 Malignant neoplasm of prostate: Secondary | ICD-10-CM | POA: Diagnosis not present

## 2014-07-29 DIAGNOSIS — C61 Malignant neoplasm of prostate: Secondary | ICD-10-CM | POA: Diagnosis not present

## 2014-07-29 DIAGNOSIS — C7952 Secondary malignant neoplasm of bone marrow: Secondary | ICD-10-CM | POA: Diagnosis not present

## 2014-07-29 DIAGNOSIS — C7951 Secondary malignant neoplasm of bone: Secondary | ICD-10-CM | POA: Diagnosis not present

## 2014-07-31 ENCOUNTER — Other Ambulatory Visit: Payer: Self-pay | Admitting: Internal Medicine

## 2014-08-27 DIAGNOSIS — C61 Malignant neoplasm of prostate: Secondary | ICD-10-CM | POA: Diagnosis not present

## 2014-08-27 DIAGNOSIS — C7951 Secondary malignant neoplasm of bone: Secondary | ICD-10-CM | POA: Diagnosis not present

## 2014-09-02 ENCOUNTER — Other Ambulatory Visit: Payer: Medicare Other

## 2014-09-03 ENCOUNTER — Other Ambulatory Visit: Payer: Self-pay | Admitting: Internal Medicine

## 2014-09-04 ENCOUNTER — Encounter: Payer: Medicare Other | Admitting: Internal Medicine

## 2014-09-06 ENCOUNTER — Other Ambulatory Visit: Payer: Medicare Other

## 2014-09-06 DIAGNOSIS — E1142 Type 2 diabetes mellitus with diabetic polyneuropathy: Secondary | ICD-10-CM

## 2014-09-06 DIAGNOSIS — E1140 Type 2 diabetes mellitus with diabetic neuropathy, unspecified: Secondary | ICD-10-CM | POA: Diagnosis not present

## 2014-09-06 DIAGNOSIS — G629 Polyneuropathy, unspecified: Secondary | ICD-10-CM | POA: Diagnosis not present

## 2014-09-07 LAB — COMPREHENSIVE METABOLIC PANEL
ALBUMIN: 3.5 g/dL (ref 3.5–4.8)
ALK PHOS: 99 IU/L (ref 39–117)
ALT: 8 IU/L (ref 0–44)
AST: 9 IU/L (ref 0–40)
Albumin/Globulin Ratio: 1.3 (ref 1.1–2.5)
BUN / CREAT RATIO: 20 (ref 10–22)
BUN: 26 mg/dL (ref 8–27)
CHLORIDE: 102 mmol/L (ref 97–108)
CO2: 21 mmol/L (ref 18–29)
Calcium: 8.8 mg/dL (ref 8.6–10.2)
Creatinine, Ser: 1.31 mg/dL — ABNORMAL HIGH (ref 0.76–1.27)
GFR calc Af Amer: 60 mL/min/{1.73_m2} (ref >59)
GFR, EST NON AFRICAN AMERICAN: 52 mL/min/{1.73_m2} — AB (ref >59)
Globulin, Total: 2.6 g/dL (ref 1.5–4.5)
Glucose: 128 mg/dL — ABNORMAL HIGH (ref 65–99)
POTASSIUM: 4 mmol/L (ref 3.5–5.2)
Sodium: 140 mmol/L (ref 134–144)
Total Bilirubin: 0.3 mg/dL (ref 0.0–1.2)
Total Protein: 6.1 g/dL (ref 6.0–8.5)

## 2014-09-07 LAB — HEMOGLOBIN A1C
Est. average glucose Bld gHb Est-mCnc: 123 mg/dL
Hgb A1c MFr Bld: 5.9 % — ABNORMAL HIGH (ref 4.8–5.6)

## 2014-09-10 ENCOUNTER — Encounter: Payer: Self-pay | Admitting: Internal Medicine

## 2014-09-10 ENCOUNTER — Ambulatory Visit (INDEPENDENT_AMBULATORY_CARE_PROVIDER_SITE_OTHER): Payer: Medicare Other | Admitting: Internal Medicine

## 2014-09-10 VITALS — BP 134/82 | HR 73 | Temp 97.9°F | Resp 10 | Ht 71.0 in | Wt 256.0 lb

## 2014-09-10 DIAGNOSIS — Z1211 Encounter for screening for malignant neoplasm of colon: Secondary | ICD-10-CM | POA: Diagnosis not present

## 2014-09-10 DIAGNOSIS — C7951 Secondary malignant neoplasm of bone: Secondary | ICD-10-CM

## 2014-09-10 DIAGNOSIS — I10 Essential (primary) hypertension: Secondary | ICD-10-CM | POA: Diagnosis not present

## 2014-09-10 DIAGNOSIS — E1129 Type 2 diabetes mellitus with other diabetic kidney complication: Secondary | ICD-10-CM | POA: Insufficient documentation

## 2014-09-10 DIAGNOSIS — E559 Vitamin D deficiency, unspecified: Secondary | ICD-10-CM

## 2014-09-10 DIAGNOSIS — E1121 Type 2 diabetes mellitus with diabetic nephropathy: Secondary | ICD-10-CM

## 2014-09-10 DIAGNOSIS — C7952 Secondary malignant neoplasm of bone marrow: Secondary | ICD-10-CM

## 2014-09-10 DIAGNOSIS — E785 Hyperlipidemia, unspecified: Secondary | ICD-10-CM

## 2014-09-10 NOTE — Progress Notes (Signed)
Passed clock drawing 

## 2014-09-10 NOTE — Progress Notes (Signed)
Patient ID: Jeremiah Collins, male   DOB: [birth year], 80 y.o.   MRN: [id number]      Allergies  Allergen Reactions  . Abiraterone Other (See Comments) and Nausea And Vomiting    Chief Complaint: annual visit  HPI:  80 y/o male patient is here for his annual exam. He has hx of prostate cancer with mets to the bone, DM with renal complications, hyperlipidemia, HTN, vitamin d def. He complaints of left leg pain. He is s/p lupron injections and has f/u with urology next week for his prostate cancer. His swelling in leg has subsided. He has cough with runny nose for few days. He denies any other complaints  Review of Systems:  Constitutional: Negative for fever, chills, malaise/fatigue and diaphoresis.  HENT: Negative for congestion, hearing loss and sore throat.   Eyes: Negative for eye pain, blurred vision, double vision and discharge. uptodate with eye exam Respiratory: Negative for shortness of breath and wheezing.   Cardiovascular: Negative for chest pain, palpitations, orthopnea. Has some leg swelling.  Gastrointestinal: Negative for heartburn, nausea, vomiting, abdominal pain, diarrhea and constipation.  Genitourinary: Negative for dysuria, hematuria and flank pain. has urinary frequency Musculoskeletal: Negative for back pain, falls. Has joint pain Skin: Negative for itching and rash.  Neurological: Negative for weakness,dizziness, tingling, focal weakness and headaches.  Psychiatric/Behavioral: Negative for depression and memory loss. The patient is not nervous/anxious.     Past Medical History  Diagnosis Date  . HTN (hypertension)   . Other and unspecified hyperlipidemia 01/02/2013  . Routine general medical examination at a health care facility   . Acute bronchitis 09/12/2012  . Cellulitis and abscess of leg, except foot 01/17/2012  . Gross hematuria 09/06/2011  . Osteoarthrosis involving, or with mention of more than one site, but not specified as generalized, multiple sites  10/22/2010  . Reflux esophagitis 05/10/2008  . First degree atrioventricular block 04/25/2007  . 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 Palpitations 04/28/2004  . Chest pain, unspecified 04/28/2004  . Closed fracture of five ribs 03/24/2004  . Spinal stenosis, unspecified region other than cervical 04/29/1995  . Other abnormal blood chemistry 04/29/1991  . Cataract     Dr.Groat  . Type II or unspecified type diabetes mellitus without mention of complication, uncontrolled   . Prostate cancer 12/16/1994    gleason 7  . Hx of radiation therapy 11/1998    prostate fossa - 6040 cGy, 33 fx, Dr Danny Lawless  . Bone metastases     left mid tibial shaft  . History of radiation therapy 04/03/14- 04/17/14    mid to distal left tibia 3000 cGy in 10 sessions   Past Surgical History  Procedure Laterality Date  . Left ureteral stent placement  Week of 12/05/11  . Prostatectomy  1996    Dr. Rosana Hoes  . Eye surgery  2006    cataract, Dr Shanon Rosser   Social History:   reports that he quit smoking about 45 years ago. His smoking use included Cigars. He does not have any smokeless tobacco history on file. He reports that he does not drink alcohol or use illicit drugs.  Family History  Problem Relation Age of Onset  . Heart disease Father   . Stroke Sister     Medications: Patient's Medications  New Prescriptions   No medications on file  Previous Medications   CHOLECALCIFEROL (VITAMIN D) 400 UNITS TABS TABLET    Take 1 tablet (400 Units total) by mouth daily. Take 2 tablets daily   DOXAZOSIN (  CARDURA) 4 MG TABLET    TAKE ONE TABLET BY MOUTH ONCE DAILY   FUROSEMIDE (LASIX) 20 MG TABLET    TAKE ONE TABLET BY MOUTH ONCE DAILY   GLUCOSE BLOOD (ONETOUCH VERIO) TEST STRIP    Check blood sugar 1-2 times weekly.  DX: 250.00   HYALURONATE SODIUM (RADIAPLEXRX) GEL    Apply 1 application topically 2 (two) times daily. Apply to leg treated area after rad tx and bedtime, and on weekends   LISINOPRIL-HYDROCHLOROTHIAZIDE  (PRINZIDE,ZESTORETIC) 10-12.5 MG PER TABLET    Take 1 tablet by mouth daily.   METFORMIN (GLUCOPHAGE) 500 MG TABLET    Take 1 tablet (500 mg total) by mouth daily with breakfast.   METOPROLOL TARTRATE (LOPRESSOR) 25 MG TABLET    Take 0.5 tablets (12.5 mg total) by mouth 2 (two) times daily.   OMEGA-3 FATTY ACIDS (FISH OIL) 1000 MG CAPS    Take 1 capsule (1,000 mg total) by mouth daily.   ONETOUCH DELICA LANCETS 26O MISC    Check blood sugar 1 time weekly.   DX: 250.00  Modified Medications   No medications on file  Discontinued Medications   ALBUTEROL (PROVENTIL HFA;VENTOLIN HFA) 108 (90 BASE) MCG/ACT INHALER    Inhale 2 puffs into the lungs 3 (three) times daily between meals as needed for wheezing or shortness of breath.   METOPROLOL TARTRATE (LOPRESSOR) 25 MG TABLET    TAKE ONE TABLET BY MOUTH TWICE DAILY   PREDNISONE (DELTASONE) 5 MG TABLET    1 po bid     Physical Exam: Filed Vitals:   09/10/14 0828  BP: 134/82  Pulse: 73  Temp: 97.9 F (36.6 C)  TempSrc: Oral  Resp: 10  Height: 5\' 11"  (1.803 m)  Weight: 256 lb (116.121 kg)  SpO2: 96%   Wt Readings from Last 3 Encounters:  09/10/14 256 lb (116.121 kg)  05/29/14 254 lb 6.4 oz (115.395 kg)  05/22/14 245 lb 8 oz (111.358 kg)   General- elderly obese male in no acute distress Head- atraumatic, normocephalic Eyes- PERRLA, EOMI, no pallor, no icterus, no discharge Neck- no lymphadenopathy, no thyromegaly, no jugular vein distension Ears- left ear normal tympanic membrane and normal external ear canal , right ear normal tympanic membrane and normal external ear canal Throat- moist mucus membrane, normal oropharynx Nose- normal nasal mucosa, no maxillary or frontal sinus tenderness Chest- no chest wall deformities, no chest wall tenderness Breast- no masses, no palpable lumps, normal nipple and areola exam, no axillary lymphadenopathy Cardiovascular- normal s1,s2, no murmurs/ rubs/ gallops Respiratory- bilateral clear to  auscultation, no wheeze, no rhonchi, no crackles, no use of accessory muscles Abdomen- bowel sounds present, soft, non tender, no organomegaly, no abdominal bruits, no guarding or rigidity, no CVA tenderness Musculoskeletal- able to move all 4 extremities, no spinal and paraspinal tenderness, steady gait, no use of assistive device, normal range of motion, trace leg edema Neurological- no focal deficit, normal reflexes, normal muscle strength, normal sensation to fine touch and vibration Skin- warm and dry Nails- hypertrophied toe nails Psychiatry- alert and oriented to person, place and time, normal mood and affect   Labs reviewed: Basic Metabolic Panel:  Recent Labs  09/14/13 0825 05/27/14 0914 09/06/14 0839  NA 141 142 140  K 4.6 4.1 4.0  CL 102 103 102  CO2 23 20 21   GLUCOSE 117* 119* 128*  BUN 23 32* 26  CREATININE 0.99 1.22 1.31*  CALCIUM 8.9 9.3 8.8   Liver Function Tests:  Recent Labs  09/14/13 0825 05/27/14 0914 09/06/14 0839  AST 11 13 9   ALT 11 9 8   ALKPHOS 96 87 99  BILITOT 0.3 0.3 0.3  PROT 5.9* 6.0 6.1   Lab Results  Component Value Date   HGBA1C 5.9* 09/06/2014   Lipid Panel     Component Value Date/Time   TRIG 118 05/27/2014 0914   HDL 40 05/27/2014 0914   CHOLHDL 4.9 05/27/2014 0914   LDLCALC 133* 05/27/2014 0914   12/13/12 ekg NSR 09/10/14 mmse 30/30, completed clock draw  Assessment/Plan  1. Colon cancer screening - Fecal occult blood, imunochemical; Future  2. Essential hypertension, benign Stable, continue lisinopril-hctz. Will d/c lasix for now with edema stable  3. Diabetes mellitus with renal manifestations, controlled a1c s/o controlled DM. Has microalbuminuria. Continue metforrmin 500 mg daily and prinzide current regimen. - CMP; Future - CBC with Differential; Future - Lipid Panel; Future - Hemoglobin A1c; Future  4. Secondary malignant neoplasm of bone and bone marrow With primary prostate cancer, has been following with  urology. S/p radiation therapy. Continue doxazosin  5. Hyperlipidemia Continue fish oil. Pt does not want statin at present - Lipid Panel; Future  6. Vitamin D deficiency Continue vitamin d supplement  7. Annual exam the patient was counseled regarding the appropriate use of alcohol, prevention of dental and periodontal disease, diet, regular sustained exercise for at least 30 minutes 5 times per week, the proper use of sunscreen and protective clothing, tobacco use, and recommended schedule for GI hemoccult testing, colonoscopy, cholesterol, thyroid and diabetes screening. Advance directive reading materials provided. Schedule appointment for nurse visit for influenza vaccine next week once pt is over with his common cold  Labs/tests ordered: cbc, cmp, a1c, lipid prior to next visit, FOBT card provided    [facility], MD  [facility] [phone] (Monday-Friday 8 am - 5 pm) [phone] (afterhours)

## 2014-09-16 DIAGNOSIS — C61 Malignant neoplasm of prostate: Secondary | ICD-10-CM | POA: Diagnosis not present

## 2014-09-16 DIAGNOSIS — N131 Hydronephrosis with ureteral stricture, not elsewhere classified: Secondary | ICD-10-CM | POA: Diagnosis not present

## 2014-09-16 DIAGNOSIS — C7951 Secondary malignant neoplasm of bone: Secondary | ICD-10-CM | POA: Diagnosis not present

## 2014-09-17 DIAGNOSIS — M25472 Effusion, left ankle: Secondary | ICD-10-CM | POA: Diagnosis not present

## 2014-09-17 DIAGNOSIS — C61 Malignant neoplasm of prostate: Secondary | ICD-10-CM | POA: Diagnosis not present

## 2014-09-17 DIAGNOSIS — M84462D Pathological fracture, left tibia, subsequent encounter for fracture with routine healing: Secondary | ICD-10-CM | POA: Diagnosis not present

## 2014-09-17 DIAGNOSIS — M7652 Patellar tendinitis, left knee: Secondary | ICD-10-CM | POA: Diagnosis not present

## 2014-09-17 DIAGNOSIS — C7951 Secondary malignant neoplasm of bone: Secondary | ICD-10-CM | POA: Diagnosis not present

## 2014-09-17 DIAGNOSIS — M1712 Unilateral primary osteoarthritis, left knee: Secondary | ICD-10-CM | POA: Diagnosis not present

## 2014-09-17 DIAGNOSIS — M19072 Primary osteoarthritis, left ankle and foot: Secondary | ICD-10-CM | POA: Diagnosis not present

## 2014-09-17 DIAGNOSIS — M84469D Pathological fracture, unspecified tibia and fibula, subsequent encounter for fracture with routine healing: Secondary | ICD-10-CM | POA: Diagnosis not present

## 2014-09-20 ENCOUNTER — Other Ambulatory Visit: Payer: Medicare Other

## 2014-09-20 DIAGNOSIS — Z1211 Encounter for screening for malignant neoplasm of colon: Secondary | ICD-10-CM | POA: Diagnosis not present

## 2014-09-21 LAB — FECAL OCCULT BLOOD, IMMUNOCHEMICAL: FECAL OCCULT BLD: NEGATIVE

## 2014-09-25 ENCOUNTER — Encounter: Payer: Self-pay | Admitting: *Deleted

## 2014-10-01 DIAGNOSIS — C7951 Secondary malignant neoplasm of bone: Secondary | ICD-10-CM | POA: Diagnosis not present

## 2014-10-01 DIAGNOSIS — C7982 Secondary malignant neoplasm of genital organs: Secondary | ICD-10-CM | POA: Diagnosis not present

## 2014-10-01 DIAGNOSIS — D649 Anemia, unspecified: Secondary | ICD-10-CM | POA: Diagnosis not present

## 2014-10-01 DIAGNOSIS — C61 Malignant neoplasm of prostate: Secondary | ICD-10-CM | POA: Diagnosis not present

## 2014-10-01 DIAGNOSIS — I10 Essential (primary) hypertension: Secondary | ICD-10-CM | POA: Diagnosis not present

## 2014-10-01 DIAGNOSIS — Z6836 Body mass index (BMI) 36.0-36.9, adult: Secondary | ICD-10-CM | POA: Diagnosis not present

## 2014-10-01 DIAGNOSIS — N359 Urethral stricture, unspecified: Secondary | ICD-10-CM | POA: Diagnosis not present

## 2014-10-01 DIAGNOSIS — K219 Gastro-esophageal reflux disease without esophagitis: Secondary | ICD-10-CM | POA: Diagnosis not present

## 2014-10-01 DIAGNOSIS — Z466 Encounter for fitting and adjustment of urinary device: Secondary | ICD-10-CM | POA: Diagnosis not present

## 2014-10-01 DIAGNOSIS — N135 Crossing vessel and stricture of ureter without hydronephrosis: Secondary | ICD-10-CM | POA: Diagnosis not present

## 2014-10-01 DIAGNOSIS — E119 Type 2 diabetes mellitus without complications: Secondary | ICD-10-CM | POA: Diagnosis not present

## 2014-10-01 DIAGNOSIS — E669 Obesity, unspecified: Secondary | ICD-10-CM | POA: Diagnosis not present

## 2014-10-01 DIAGNOSIS — Z87891 Personal history of nicotine dependence: Secondary | ICD-10-CM | POA: Diagnosis not present

## 2014-10-01 DIAGNOSIS — Z923 Personal history of irradiation: Secondary | ICD-10-CM | POA: Diagnosis not present

## 2014-10-01 DIAGNOSIS — G4730 Sleep apnea, unspecified: Secondary | ICD-10-CM | POA: Diagnosis not present

## 2014-10-01 DIAGNOSIS — N183 Chronic kidney disease, stage 3 (moderate): Secondary | ICD-10-CM | POA: Diagnosis not present

## 2014-10-01 DIAGNOSIS — M1990 Unspecified osteoarthritis, unspecified site: Secondary | ICD-10-CM | POA: Diagnosis not present

## 2014-10-01 DIAGNOSIS — N320 Bladder-neck obstruction: Secondary | ICD-10-CM | POA: Diagnosis not present

## 2014-10-31 ENCOUNTER — Other Ambulatory Visit: Payer: Self-pay | Admitting: Internal Medicine

## 2014-11-06 ENCOUNTER — Other Ambulatory Visit: Payer: Self-pay | Admitting: Internal Medicine

## 2014-11-26 DIAGNOSIS — I10 Essential (primary) hypertension: Secondary | ICD-10-CM | POA: Diagnosis not present

## 2014-11-26 DIAGNOSIS — Z5111 Encounter for antineoplastic chemotherapy: Secondary | ICD-10-CM | POA: Diagnosis not present

## 2014-11-26 DIAGNOSIS — M84462D Pathological fracture, left tibia, subsequent encounter for fracture with routine healing: Secondary | ICD-10-CM | POA: Diagnosis not present

## 2014-11-26 DIAGNOSIS — C7951 Secondary malignant neoplasm of bone: Secondary | ICD-10-CM | POA: Diagnosis not present

## 2014-11-26 DIAGNOSIS — R609 Edema, unspecified: Secondary | ICD-10-CM | POA: Diagnosis not present

## 2014-11-26 DIAGNOSIS — C61 Malignant neoplasm of prostate: Secondary | ICD-10-CM | POA: Diagnosis not present

## 2014-12-02 ENCOUNTER — Other Ambulatory Visit: Payer: Self-pay | Admitting: Internal Medicine

## 2014-12-16 DIAGNOSIS — C61 Malignant neoplasm of prostate: Secondary | ICD-10-CM | POA: Diagnosis not present

## 2014-12-16 DIAGNOSIS — R8290 Unspecified abnormal findings in urine: Secondary | ICD-10-CM | POA: Diagnosis not present

## 2014-12-19 DIAGNOSIS — M84469D Pathological fracture, unspecified tibia and fibula, subsequent encounter for fracture with routine healing: Secondary | ICD-10-CM | POA: Diagnosis not present

## 2014-12-19 DIAGNOSIS — Z967 Presence of other bone and tendon implants: Secondary | ICD-10-CM | POA: Diagnosis not present

## 2014-12-19 DIAGNOSIS — M84564D Pathological fracture in neoplastic disease, left fibula, subsequent encounter for fracture with routine healing: Secondary | ICD-10-CM | POA: Diagnosis not present

## 2014-12-19 DIAGNOSIS — M179 Osteoarthritis of knee, unspecified: Secondary | ICD-10-CM | POA: Diagnosis not present

## 2014-12-19 DIAGNOSIS — M778 Other enthesopathies, not elsewhere classified: Secondary | ICD-10-CM | POA: Diagnosis not present

## 2014-12-19 DIAGNOSIS — M84562D Pathological fracture in neoplastic disease, left tibia, subsequent encounter for fracture with routine healing: Secondary | ICD-10-CM | POA: Diagnosis not present

## 2014-12-19 DIAGNOSIS — C7951 Secondary malignant neoplasm of bone: Secondary | ICD-10-CM | POA: Diagnosis not present

## 2014-12-27 DIAGNOSIS — C44629 Squamous cell carcinoma of skin of left upper limb, including shoulder: Secondary | ICD-10-CM | POA: Diagnosis not present

## 2014-12-31 DIAGNOSIS — Z466 Encounter for fitting and adjustment of urinary device: Secondary | ICD-10-CM | POA: Diagnosis not present

## 2014-12-31 DIAGNOSIS — Z8546 Personal history of malignant neoplasm of prostate: Secondary | ICD-10-CM | POA: Diagnosis not present

## 2014-12-31 DIAGNOSIS — M1990 Unspecified osteoarthritis, unspecified site: Secondary | ICD-10-CM | POA: Diagnosis not present

## 2014-12-31 DIAGNOSIS — Z79899 Other long term (current) drug therapy: Secondary | ICD-10-CM | POA: Diagnosis not present

## 2014-12-31 DIAGNOSIS — D649 Anemia, unspecified: Secondary | ICD-10-CM | POA: Diagnosis not present

## 2014-12-31 DIAGNOSIS — K219 Gastro-esophageal reflux disease without esophagitis: Secondary | ICD-10-CM | POA: Diagnosis not present

## 2014-12-31 DIAGNOSIS — I129 Hypertensive chronic kidney disease with stage 1 through stage 4 chronic kidney disease, or unspecified chronic kidney disease: Secondary | ICD-10-CM | POA: Diagnosis not present

## 2014-12-31 DIAGNOSIS — C61 Malignant neoplasm of prostate: Secondary | ICD-10-CM | POA: Diagnosis not present

## 2014-12-31 DIAGNOSIS — N135 Crossing vessel and stricture of ureter without hydronephrosis: Secondary | ICD-10-CM | POA: Diagnosis not present

## 2014-12-31 DIAGNOSIS — C7951 Secondary malignant neoplasm of bone: Secondary | ICD-10-CM | POA: Diagnosis not present

## 2014-12-31 DIAGNOSIS — N131 Hydronephrosis with ureteral stricture, not elsewhere classified: Secondary | ICD-10-CM | POA: Diagnosis not present

## 2014-12-31 DIAGNOSIS — E119 Type 2 diabetes mellitus without complications: Secondary | ICD-10-CM | POA: Diagnosis not present

## 2014-12-31 DIAGNOSIS — E785 Hyperlipidemia, unspecified: Secondary | ICD-10-CM | POA: Diagnosis not present

## 2014-12-31 DIAGNOSIS — Z87311 Personal history of (healed) other pathological fracture: Secondary | ICD-10-CM | POA: Diagnosis not present

## 2014-12-31 DIAGNOSIS — N183 Chronic kidney disease, stage 3 (moderate): Secondary | ICD-10-CM | POA: Diagnosis not present

## 2014-12-31 DIAGNOSIS — N359 Urethral stricture, unspecified: Secondary | ICD-10-CM | POA: Diagnosis not present

## 2014-12-31 DIAGNOSIS — Z87891 Personal history of nicotine dependence: Secondary | ICD-10-CM | POA: Diagnosis not present

## 2014-12-31 HISTORY — PX: CYSTOSCOPY W/ URETERAL STENT PLACEMENT: SHX1429

## 2015-01-10 ENCOUNTER — Other Ambulatory Visit: Payer: Medicare Other

## 2015-01-10 DIAGNOSIS — E1121 Type 2 diabetes mellitus with diabetic nephropathy: Secondary | ICD-10-CM | POA: Diagnosis not present

## 2015-01-10 DIAGNOSIS — E1129 Type 2 diabetes mellitus with other diabetic kidney complication: Secondary | ICD-10-CM

## 2015-01-10 DIAGNOSIS — E785 Hyperlipidemia, unspecified: Secondary | ICD-10-CM | POA: Diagnosis not present

## 2015-01-10 DIAGNOSIS — R5383 Other fatigue: Secondary | ICD-10-CM | POA: Diagnosis not present

## 2015-01-11 LAB — COMPREHENSIVE METABOLIC PANEL
ALBUMIN: 3.8 g/dL (ref 3.5–4.8)
ALT: 11 IU/L (ref 0–44)
AST: 12 IU/L (ref 0–40)
Albumin/Globulin Ratio: 1.7 (ref 1.1–2.5)
Alkaline Phosphatase: 92 IU/L (ref 39–117)
BUN/Creatinine Ratio: 16 (ref 10–22)
BUN: 19 mg/dL (ref 8–27)
Bilirubin Total: 0.3 mg/dL (ref 0.0–1.2)
CALCIUM: 8.7 mg/dL (ref 8.6–10.2)
CO2: 20 mmol/L (ref 18–29)
Chloride: 103 mmol/L (ref 97–108)
Creatinine, Ser: 1.19 mg/dL (ref 0.76–1.27)
GFR calc non Af Amer: 58 mL/min/{1.73_m2} — ABNORMAL LOW (ref >59)
GFR, EST AFRICAN AMERICAN: 67 mL/min/{1.73_m2} (ref >59)
Globulin, Total: 2.3 g/dL (ref 1.5–4.5)
Glucose: 125 mg/dL — ABNORMAL HIGH (ref 65–99)
Potassium: 3.9 mmol/L (ref 3.5–5.2)
Sodium: 141 mmol/L (ref 134–144)
Total Protein: 6.1 g/dL (ref 6.0–8.5)

## 2015-01-11 LAB — CBC WITH DIFFERENTIAL/PLATELET
BASOS ABS: 0 10*3/uL (ref 0.0–0.2)
BASOS: 0 %
EOS: 2 %
Eosinophils Absolute: 0.1 10*3/uL (ref 0.0–0.4)
HEMATOCRIT: 35 % — AB (ref 37.5–51.0)
Hemoglobin: 11.7 g/dL — ABNORMAL LOW (ref 12.6–17.7)
Immature Grans (Abs): 0 10*3/uL (ref 0.0–0.1)
Immature Granulocytes: 0 %
Lymphocytes Absolute: 2.4 10*3/uL (ref 0.7–3.1)
Lymphs: 33 %
MCH: 26.7 pg (ref 26.6–33.0)
MCHC: 33.4 g/dL (ref 31.5–35.7)
MCV: 80 fL (ref 79–97)
Monocytes Absolute: 0.5 10*3/uL (ref 0.1–0.9)
Monocytes: 6 %
NEUTROS PCT: 59 %
Neutrophils Absolute: 4.1 10*3/uL (ref 1.4–7.0)
PLATELETS: 214 10*3/uL (ref 150–379)
RBC: 4.38 x10E6/uL (ref 4.14–5.80)
RDW: 15.5 % — AB (ref 12.3–15.4)
WBC: 7.1 10*3/uL (ref 3.4–10.8)

## 2015-01-11 LAB — HEMOGLOBIN A1C
Est. average glucose Bld gHb Est-mCnc: 128 mg/dL
HEMOGLOBIN A1C: 6.1 % — AB (ref 4.8–5.6)

## 2015-01-11 LAB — LIPID PANEL
CHOL/HDL RATIO: 4.2 ratio (ref 0.0–5.0)
CHOLESTEROL TOTAL: 181 mg/dL (ref 100–199)
HDL: 43 mg/dL (ref >39)
LDL CALC: 119 mg/dL — AB (ref 0–99)
Triglycerides: 96 mg/dL (ref 0–149)
VLDL Cholesterol Cal: 19 mg/dL (ref 5–40)

## 2015-01-14 ENCOUNTER — Encounter: Payer: Self-pay | Admitting: Internal Medicine

## 2015-01-14 ENCOUNTER — Ambulatory Visit (INDEPENDENT_AMBULATORY_CARE_PROVIDER_SITE_OTHER): Payer: Medicare Other | Admitting: Internal Medicine

## 2015-01-14 VITALS — BP 142/78 | HR 67 | Temp 97.7°F | Resp 12 | Ht 71.0 in | Wt 259.4 lb

## 2015-01-14 DIAGNOSIS — M899 Disorder of bone, unspecified: Secondary | ICD-10-CM | POA: Diagnosis not present

## 2015-01-14 DIAGNOSIS — M949 Disorder of cartilage, unspecified: Secondary | ICD-10-CM

## 2015-01-14 DIAGNOSIS — T732XXA Exhaustion due to exposure, initial encounter: Secondary | ICD-10-CM | POA: Diagnosis not present

## 2015-01-14 DIAGNOSIS — E1169 Type 2 diabetes mellitus with other specified complication: Secondary | ICD-10-CM

## 2015-01-14 DIAGNOSIS — E785 Hyperlipidemia, unspecified: Secondary | ICD-10-CM | POA: Diagnosis not present

## 2015-01-14 DIAGNOSIS — E669 Obesity, unspecified: Secondary | ICD-10-CM

## 2015-01-14 DIAGNOSIS — I10 Essential (primary) hypertension: Secondary | ICD-10-CM

## 2015-01-14 DIAGNOSIS — Z23 Encounter for immunization: Secondary | ICD-10-CM

## 2015-01-14 DIAGNOSIS — R600 Localized edema: Secondary | ICD-10-CM

## 2015-01-14 DIAGNOSIS — E119 Type 2 diabetes mellitus without complications: Secondary | ICD-10-CM | POA: Diagnosis not present

## 2015-01-14 DIAGNOSIS — C7951 Secondary malignant neoplasm of bone: Secondary | ICD-10-CM

## 2015-01-14 DIAGNOSIS — C7952 Secondary malignant neoplasm of bone marrow: Secondary | ICD-10-CM

## 2015-01-14 MED ORDER — SIMVASTATIN 5 MG PO TABS: 5.0000 mg | ORAL_TABLET | Freq: Every day | ORAL | Status: DC

## 2015-01-14 MED ORDER — ASPIRIN 81 MG PO TABS: 81.0000 mg | ORAL_TABLET | Freq: Every day | ORAL | Status: DC

## 2015-01-14 MED ORDER — METOPROLOL TARTRATE 25 MG PO TABS: 25.0000 mg | ORAL_TABLET | Freq: Every day | ORAL | Status: DC

## 2015-01-14 MED ORDER — LISINOPRIL-HYDROCHLOROTHIAZIDE 20-12.5 MG PO TABS: 1.0000 | ORAL_TABLET | Freq: Every day | ORAL | Status: DC

## 2015-01-14 NOTE — Progress Notes (Signed)
Patient ID: Jeremiah Collins, male   DOB: August 07, 1934, 81 y.o.   MRN: [id number]    Chief Complaint  Patient presents with  . Medical Management of Chronic Issues    4 month follow-up, discuss labs (copy printed)   . Immunizations    Refused to update pneumonia 13 and flu vaccine    Allergies  Allergen Reactions  . Abiraterone Other (See Comments) and Nausea And Vomiting    HPI:   81 y/o male patient is here for his routine visit. He has been seen by urology and had left cystoscopy with stent change in 12/2014. He has also been seen by orthopedics for follow up s/p IMN of left leg post distal tibial pathological fracture and is currently WBAT.  He has hx of prostate cancer with mets to the bone, DM with renal complications, hyperlipidemia, HTN, vitamin d def.  His swelling in leg has somewhat improved. Not using his ted hose on regular basis Reviewed his labs Complaint with his medications  Does not want influenza vaccine for now.  Agrees to take prevnar 13 today Has not had dexa scan in past Feels tired mostly Denies depression  Review of Systems:  Constitutional: Negative for fever, chills, diaphoresis.  HENT: Negative for congestion, hearing loss and sore throat.   Eyes: Negative for eye pain, blurred vision, double vision and discharge. uptodate with eye exam Respiratory: Negative for shortness of breath and wheezing.   Cardiovascular: Negative for chest pain, palpitations, orthopnea. Has some leg swelling.  Gastrointestinal: Negative for heartburn, nausea, vomiting, abdominal pain, diarrhea and constipation.  Genitourinary: Negative for dysuria, hematuria and flank pain. has urinary frequency. Follows with urology. Musculoskeletal: Negative for back pain, falls. Has joint pain Skin: Negative for itching and rash.  Neurological: Negative for weakness,dizziness, tingling, focal weakness and headaches.  Psychiatric/Behavioral: Negative for depression and memory loss. The patient  is not nervous/anxious.    Past Medical History  Diagnosis Date  . HTN (hypertension)   . Other and unspecified hyperlipidemia 01/02/2013  . Routine general medical examination at a health care facility   . Acute bronchitis 09/12/2012  . Cellulitis and abscess of leg, except foot 01/17/2012  . Gross hematuria 09/06/2011  . Osteoarthrosis involving, or with mention of more than one site, but not specified as generalized, multiple sites 10/22/2010  . Reflux esophagitis 05/10/2008  . First degree atrioventricular block 04/25/2007  . 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 Palpitations 04/28/2004  . Chest pain, unspecified 04/28/2004  . Closed fracture of five ribs 03/24/2004  . Spinal stenosis, unspecified region other than cervical 04/29/1995  . Other abnormal blood chemistry 04/29/1991  . Cataract     Dr.Groat  . Type II or unspecified type diabetes mellitus without mention of complication, uncontrolled   . Prostate cancer 12/16/1994    gleason 7  . Hx of radiation therapy 11/1998    prostate fossa - 6040 cGy, 33 fx, Dr Danny Lawless  . Bone metastases     left mid tibial shaft  . History of radiation therapy 04/03/14- 04/17/14    mid to distal left tibia 3000 cGy in 10 sessions   Medication reviewed. See MAR  History   Social History  . Marital Status: Married    Spouse Name: N/A  . Number of Children: N/A  . Years of Education: N/A   Occupational History  . Not on file.   Social History Main Topics  . Smoking status: Former Smoker    Types: Cigars    Quit date:  12/16/1968  . Smokeless tobacco: Not on file  . Alcohol Use: No  . Drug Use: No  . Sexual Activity: Not on file   Other Topics Concern  . Not on file   Social History Narrative    Physical exam BP 142/78 mmHg  Pulse 67  Temp(Src) 97.7 F (36.5 C) (Oral)  Resp 12  Ht 5\' 11"  (1.803 m)  Wt 259 lb 6.4 oz (117.663 kg)  BMI 36.19 kg/m2  SpO2 96%  Wt Readings from Last 3 Encounters:  01/14/15 259 lb 6.4 oz  (117.663 kg)  09/10/14 256 lb (116.121 kg)  05/29/14 254 lb 6.4 oz (115.395 kg)   General- elderly obese male in no acute distress Head- atraumatic, normocephalic Eyes- PERRLA, EOMI, no pallor, no icterus, no discharge Neck- no lymphadenopathy Throat- moist mucus membrane, normal oropharynx Cardiovascular- normal s1,s2, no murmurs/ rubs/ gallops Respiratory- bilateral clear to auscultation, no wheeze, no rhonchi, no crackles, no use of accessory muscles Abdomen- bowel sounds present, soft, non tender Musculoskeletal- able to move all 4 extremities, 1+ ankle edema Neurological- no focal deficit Skin- warm and dry Psychiatry- alert and oriented with normal mood and affect  Labs  CBC Latest Ref Rng 01/10/2015 12/12/2012 12/11/2012  WBC 3.4 - 10.8 x10E3/uL 7.1 8.9 12.1(H)  Hemoglobin 12.6 - 17.7 g/dL 11.7(L) 11.4(L) 11.5(L)  Hematocrit 37.5 - 51.0 % 35.0(L) 33.0(L) 33.3(L)  Platelets 150 - 379 x10E3/uL 214 163 158   CMP Latest Ref Rng 01/10/2015 09/06/2014 05/27/2014  Glucose 65 - 99 mg/dL 125(H) 128(H) 119(H)  BUN 8 - 27 mg/dL 19 26 32(H)  Creatinine 0.76 - 1.27 mg/dL 1.19 1.31(H) 1.22  Sodium 134 - 144 mmol/L 141 140 142  Potassium 3.5 - 5.2 mmol/L 3.9 4.0 4.1  Chloride 97 - 108 mmol/L 103 102 103  CO2 18 - 29 mmol/L 20 21 20   Calcium 8.6 - 10.2 mg/dL 8.7 8.8 9.3  Total Protein 6.0 - 8.5 g/dL 6.1 6.1 6.0  Albumin 3.5 - 4.8 g/dL 3.8 3.5 3.6  Total Bilirubin 0.0 - 1.2 mg/dL 0.3 0.3 0.3  Alkaline Phos 39 - 117 IU/L 92 99 87  AST 0 - 40 IU/L 12 9 13   ALT 0 - 44 IU/L 11 8 9    Lab Results  Component Value Date   HGBA1C 6.1* 01/10/2015   Lipid Panel     Component Value Date/Time   CHOL 181 01/10/2015 0824   TRIG 96 01/10/2015 0824   HDL 43 01/10/2015 0824   CHOLHDL 4.2 01/10/2015 0824   LDLCALC 119* 01/10/2015 0824    Assessment/plan  1. Essential hypertension, benign Mildly elevated this visit. Increase his lisinopril hctz from 10-12.5 to 20-12.5 mg daily, continue  lopressor and for med compliance change from 12.5 mg bid to 25 mg daily. Monitor bp readings at home. Review bp reading on f/u visit. Warning signs with elevated bp readings explained  2. Diabetes mellitus type 2 in obese Lab Results  Component Value Date   HGBA1C 6.1* 01/10/2015   Controlled, continue metformin 500 mg daily with aspirin and zocor - Pneumococcal conjugate vaccine 13-valent  3. Disorder of bone and cartilage Recent fracture, has obesity, history of smoking and has hx of cancer with bone mets increasing risk for fracture. Continue vitamin d supplement- change from 400 u dailu to 1000 u daily. Fall precautions - DG Bone Density; Future  4. Secondary malignant neoplasm of bone and bone marrow Post prostate cancer with metastases. Follows up with urology, oncology   5.  Hyperlipidemia Continue zocor 5 mg daily  6. Bilateral edema of lower extremity Improved from before. Has chronic venous stasis, advised to wear his ted hose. On hctz which should help some, d/c lasix for now (pt not taking it at home)  7. Fatigue due to exposure, initial encounter Assess thyroid function. His obesity, prostate cancer with bone mets, anemia of chronic disease from his cancer,diabetes, HTN and sedentary lifestyle could be contributing to this. Advised on group exercise to help motivate him and trying to incorporate healthy meals. Depression screen negative.  8. Need for vaccination with 13-polyvalent pneumococcal conjugate vaccine - Pneumococcal conjugate vaccine 13-valent

## 2015-01-14 NOTE — Patient Instructions (Signed)
Stop taking lasix  Use ted hose during the time you are out of bed  Start taking cholesterol lowering pill and aspirin on daily basis  You will be contacted with appointment for bone test

## 2015-01-15 LAB — TSH: TSH: 1.44 u[IU]/mL (ref 0.450–4.500)

## 2015-01-15 LAB — SPECIMEN STATUS REPORT

## 2015-01-16 ENCOUNTER — Encounter: Payer: Self-pay | Admitting: Internal Medicine

## 2015-01-27 DIAGNOSIS — C61 Malignant neoplasm of prostate: Secondary | ICD-10-CM | POA: Diagnosis not present

## 2015-02-04 DIAGNOSIS — Z5189 Encounter for other specified aftercare: Secondary | ICD-10-CM | POA: Diagnosis not present

## 2015-02-04 DIAGNOSIS — C61 Malignant neoplasm of prostate: Secondary | ICD-10-CM | POA: Diagnosis not present

## 2015-02-05 ENCOUNTER — Ambulatory Visit
Admission: RE | Admit: 2015-02-05 | Discharge: 2015-02-05 | Disposition: A | Discharge status: Home / Self Care | Payer: Medicare Other | Source: Ambulatory Visit | Attending: Internal Medicine | Admitting: Internal Medicine

## 2015-02-05 DIAGNOSIS — M949 Disorder of cartilage, unspecified: Secondary | ICD-10-CM | POA: Diagnosis not present

## 2015-02-05 DIAGNOSIS — M899 Disorder of bone, unspecified: Secondary | ICD-10-CM | POA: Diagnosis not present

## 2015-02-07 DIAGNOSIS — Z08 Encounter for follow-up examination after completed treatment for malignant neoplasm: Secondary | ICD-10-CM | POA: Diagnosis not present

## 2015-02-07 DIAGNOSIS — Z85828 Personal history of other malignant neoplasm of skin: Secondary | ICD-10-CM | POA: Diagnosis not present

## 2015-02-21 ENCOUNTER — Other Ambulatory Visit: Payer: Self-pay | Admitting: Internal Medicine

## 2015-03-25 DIAGNOSIS — N183 Chronic kidney disease, stage 3 (moderate): Secondary | ICD-10-CM | POA: Diagnosis not present

## 2015-03-25 DIAGNOSIS — C7951 Secondary malignant neoplasm of bone: Secondary | ICD-10-CM | POA: Diagnosis not present

## 2015-03-25 DIAGNOSIS — N135 Crossing vessel and stricture of ureter without hydronephrosis: Secondary | ICD-10-CM | POA: Diagnosis not present

## 2015-03-25 DIAGNOSIS — E119 Type 2 diabetes mellitus without complications: Secondary | ICD-10-CM | POA: Diagnosis not present

## 2015-03-25 DIAGNOSIS — N359 Urethral stricture, unspecified: Secondary | ICD-10-CM | POA: Diagnosis not present

## 2015-03-25 DIAGNOSIS — R972 Elevated prostate specific antigen [PSA]: Secondary | ICD-10-CM | POA: Diagnosis not present

## 2015-03-25 DIAGNOSIS — R310 Gross hematuria: Secondary | ICD-10-CM | POA: Diagnosis not present

## 2015-03-25 DIAGNOSIS — N131 Hydronephrosis with ureteral stricture, not elsewhere classified: Secondary | ICD-10-CM | POA: Diagnosis not present

## 2015-03-25 DIAGNOSIS — K219 Gastro-esophageal reflux disease without esophagitis: Secondary | ICD-10-CM | POA: Diagnosis not present

## 2015-03-25 DIAGNOSIS — Z466 Encounter for fitting and adjustment of urinary device: Secondary | ICD-10-CM | POA: Diagnosis not present

## 2015-03-25 DIAGNOSIS — C7982 Secondary malignant neoplasm of genital organs: Secondary | ICD-10-CM | POA: Diagnosis not present

## 2015-03-25 DIAGNOSIS — C61 Malignant neoplasm of prostate: Secondary | ICD-10-CM | POA: Diagnosis not present

## 2015-03-25 DIAGNOSIS — N1339 Other hydronephrosis: Secondary | ICD-10-CM | POA: Diagnosis not present

## 2015-03-25 DIAGNOSIS — I129 Hypertensive chronic kidney disease with stage 1 through stage 4 chronic kidney disease, or unspecified chronic kidney disease: Secondary | ICD-10-CM | POA: Diagnosis not present

## 2015-03-25 DIAGNOSIS — N320 Bladder-neck obstruction: Secondary | ICD-10-CM | POA: Diagnosis not present

## 2015-03-25 DIAGNOSIS — N393 Stress incontinence (female) (male): Secondary | ICD-10-CM | POA: Diagnosis not present

## 2015-04-16 ENCOUNTER — Encounter: Payer: Self-pay | Admitting: Internal Medicine

## 2015-04-16 ENCOUNTER — Ambulatory Visit (INDEPENDENT_AMBULATORY_CARE_PROVIDER_SITE_OTHER): Payer: Medicare Other | Admitting: Internal Medicine

## 2015-04-16 VITALS — BP 140/86 | HR 61 | Temp 97.5°F | Resp 20 | Ht 71.0 in | Wt 257.4 lb

## 2015-04-16 DIAGNOSIS — E1165 Type 2 diabetes mellitus with hyperglycemia: Secondary | ICD-10-CM

## 2015-04-16 DIAGNOSIS — I10 Essential (primary) hypertension: Secondary | ICD-10-CM | POA: Diagnosis not present

## 2015-04-16 DIAGNOSIS — J439 Emphysema, unspecified: Secondary | ICD-10-CM

## 2015-04-16 DIAGNOSIS — R600 Localized edema: Secondary | ICD-10-CM

## 2015-04-16 DIAGNOSIS — C61 Malignant neoplasm of prostate: Secondary | ICD-10-CM | POA: Diagnosis not present

## 2015-04-16 DIAGNOSIS — M159 Polyosteoarthritis, unspecified: Secondary | ICD-10-CM

## 2015-04-16 DIAGNOSIS — E785 Hyperlipidemia, unspecified: Secondary | ICD-10-CM

## 2015-04-16 DIAGNOSIS — E1129 Type 2 diabetes mellitus with other diabetic kidney complication: Secondary | ICD-10-CM

## 2015-04-16 DIAGNOSIS — M150 Primary generalized (osteo)arthritis: Secondary | ICD-10-CM

## 2015-04-16 DIAGNOSIS — IMO0002 Reserved for concepts with insufficient information to code with codable children: Secondary | ICD-10-CM

## 2015-04-16 NOTE — Patient Instructions (Signed)
Continue current medications as ordered  Follow up with urology in July as scheduled  Follow up in 4 mos for CPE

## 2015-04-16 NOTE — Progress Notes (Signed)
Patient ID: Jeremiah Collins, male   DOB: 13-Aug-1934, 81 y.o.   MRN: [id number]    Location:    [facility]   Place of Service:   OFFICE   Chief Complaint  Patient presents with  . Medical Management of Chronic Issues    4 month folow-up    HPI:  81 yo male seen today for f/u. He reports occasional back pain that improves with advil. He is c/a possible affect on other meds. Tylenol does not help when pain is severe. He has only taken 2 advils in the last month.  BP controlled on cardura, lopressor and lisinopril hct.  DM controlled on metformin. Checks BS  He saw urology 1 month ago for f/u prostate cancer. Stent was changed. Last PSA about 1. No recent fx.  He is taking simvastatin for cholesterol. No myalgias.  No exac of emphysema and he has not needed to use [facility]   Past Medical History  Diagnosis Date  . HTN (hypertension)   . Other and unspecified hyperlipidemia 01/02/2013  . Routine general medical examination at a health care facility   . Acute bronchitis 09/12/2012  . Cellulitis and abscess of leg, except foot 01/17/2012  . Gross hematuria 09/06/2011  . Osteoarthrosis involving, or with mention of more than one site, but not specified as generalized, multiple sites 10/22/2010  . Reflux esophagitis 05/10/2008  . First degree atrioventricular block 04/25/2007  . 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 Palpitations 04/28/2004  . Chest pain, unspecified 04/28/2004  . Closed fracture of five ribs 03/24/2004  . Spinal stenosis, unspecified region other than cervical 04/29/1995  . Other abnormal blood chemistry 04/29/1991  . Cataract     Dr.Groat  . Type II or unspecified type diabetes mellitus without mention of complication, uncontrolled   . Prostate cancer 12/16/1994    gleason 7  . Hx of radiation therapy 11/1998    prostate fossa - 6040 cGy, 33 fx, Dr Danny Lawless  . Bone metastases     left mid tibial shaft  . History of radiation therapy 04/03/14- 04/17/14    mid to distal left  tibia 3000 cGy in 10 sessions    Past Surgical History  Procedure Laterality Date  . Left ureteral stent placement  Week of 12/05/11  . Prostatectomy  1996    Dr. Rosana Hoes  . Eye surgery  2006    cataract, Dr Shanon Rosser  . Cystoscopy w/ ureteral stent placement  12/31/14    Patient Care Team: Blanchie Serve, MD as PCP - General (Internal Medicine) Myrlene Broker, MD as Attending Physician (Urology) [name], MD as Consulting Physician (Ophthalmology) [name], MD as Consulting Physician (Ophthalmology)  History   Social History  . Marital Status: Married    Spouse Name: N/A  . Number of Children: N/A  . Years of Education: N/A   Occupational History  . Not on file.   Social History Main Topics  . Smoking status: Former Smoker    Types: Cigars    Quit date: 12/16/1968  . Smokeless tobacco: Never Used  . Alcohol Use: No  . Drug Use: No  . Sexual Activity: Not on file   Other Topics Concern  . Not on file   Social History Narrative     reports that he quit smoking about 46 years ago. His smoking use included Cigars. He has never used smokeless tobacco. He reports that he does not drink alcohol or use illicit drugs.  Allergies  Allergen Reactions  . Abiraterone  Other (See Comments) and Nausea And Vomiting    Medications: Patient's Medications  New Prescriptions   No medications on file  Previous Medications   ASPIRIN 81 MG TABLET    Take 1 tablet (81 mg total) by mouth daily.   CHOLECALCIFEROL 1000 UNITS TABLET    Take 1,000 Units by mouth daily.   DOXAZOSIN (CARDURA) 4 MG TABLET    TAKE ONE TABLET BY MOUTH ONCE DAILY   GLUCOSE BLOOD (ONETOUCH VERIO) TEST STRIP    Check blood sugar 1-2 times weekly.  DX: 250.00   LISINOPRIL-HYDROCHLOROTHIAZIDE (ZESTORETIC) 20-12.5 MG PER TABLET    Take 1 tablet by mouth daily.   METFORMIN (GLUCOPHAGE) 500 MG TABLET    TAKE ONE TABLET BY MOUTH ONCE DAILY WITH BREAKFAST   METOPROLOL TARTRATE (LOPRESSOR) 25 MG TABLET    Take 1  tablet (25 mg total) by mouth daily.   OMEGA-3 FATTY ACIDS (FISH OIL) 1000 MG CAPS    Take 1 capsule (1,000 mg total) by mouth daily.   ONETOUCH DELICA LANCETS 50P MISC    Check blood sugar 1 time weekly.   DX: 250.00   SIMVASTATIN (ZOCOR) 5 MG TABLET    Take 1 tablet (5 mg total) by mouth daily.  Modified Medications   No medications on file  Discontinued Medications   No medications on file    Review of Systems  Constitutional: Negative for chills, activity change and fatigue.  HENT: Negative for sore throat and trouble swallowing.   Eyes: Negative for visual disturbance.  Respiratory: Negative for cough, chest tightness and shortness of breath.   Cardiovascular: Negative for chest pain, palpitations and leg swelling.  Gastrointestinal: Negative for nausea, vomiting, abdominal pain and blood in stool.  Genitourinary: Negative for urgency, frequency and difficulty urinating.  Musculoskeletal: Positive for back pain and arthralgias. Negative for gait problem.       Finger joint popping x several days  Skin: Negative for rash.  Neurological: Negative for weakness and headaches.  Psychiatric/Behavioral: Negative for confusion and sleep disturbance. The patient is not nervous/anxious.     Filed Vitals:   04/16/15 0818  BP: 140/86  Pulse: 61  Temp: 97.5 F (36.4 C)  TempSrc: Oral  Resp: 20  Height: 5\' 11"  (1.803 m)  Weight: 257 lb 6.4 oz (116.756 kg)  SpO2: 96%   Body mass index is 35.92 kg/(m^2).  Physical Exam  Constitutional: He is oriented to person, place, and time. He appears well-developed and well-nourished.  HENT:  Mouth/Throat: Oropharynx is clear and moist.  Eyes: Pupils are equal, round, and reactive to light. No scleral icterus.  Neck: Neck supple. No thyromegaly present.  Cardiovascular: Normal rate, regular rhythm, normal heart sounds and intact distal pulses.  Exam reveals no gallop and no friction rub.   No murmur heard. No carotid bruit b/l; +1 pitting LE  edema L>R. No calf TTP.  Pulmonary/Chest: Effort normal and breath sounds normal. He has no wheezes. He has no rales. He exhibits no tenderness.  Abdominal: Soft. Bowel sounds are normal. He exhibits no distension, no abdominal bruit, no pulsatile midline mass and no mass. There is no tenderness. There is no rebound and no guarding.  Musculoskeletal: He exhibits edema and tenderness.       Lumbar back: He exhibits decreased range of motion, tenderness and spasm. He exhibits no bony tenderness.       Back:  Small and large joint swelling with reduced ROM and TTP esp left 2-3 MCP joint and left  4th finger PIP joint  Lymphadenopathy:    He has no cervical adenopathy.  Neurological: He is alert and oriented to person, place, and time. He has normal reflexes.  Skin: Skin is warm and dry. No rash noted.  Psychiatric: He has a normal mood and affect. His behavior is normal. Judgment and thought content normal.   Diabetic Foot Exam - Simple   Simple Foot Form  Diabetic Foot exam was performed with the following findings:  Yes   Visual Inspection  See comments:  Yes  Sensation Testing  Intact to touch and monofilament testing bilaterally:  Yes  Pulse Check  Posterior Tibialis and Dorsalis pulse intact bilaterally:  Yes  Comments  B/l toenail dystrophy. Right 2nd tip of toe with crusting blood from abrasion. No calluses or ulcerations.Marland Kitchen +1 pitting LE edema b/l. No calf TTP       Labs reviewed: No visits with results within 3 Month(s) from this visit. Latest known visit with results is:  Lab on 01/10/2015  Component Date Value Ref Range Status  . Glucose 01/10/2015 125* 65 - 99 mg/dL Final  . BUN 01/10/2015 19  8 - 27 mg/dL Final  . Creatinine, Ser 01/10/2015 1.19  0.76 - 1.27 mg/dL Final  . GFR calc non Af Amer 01/10/2015 58* >59 mL/min/1.73 Final  . GFR calc Af Amer 01/10/2015 67  >59 mL/min/1.73 Final  . BUN/Creatinine Ratio 01/10/2015 16  10 - 22 Final  . Sodium 01/10/2015 141  134  - 144 mmol/L Final  . Potassium 01/10/2015 3.9  3.5 - 5.2 mmol/L Final  . Chloride 01/10/2015 103  97 - 108 mmol/L Final  . CO2 01/10/2015 20  18 - 29 mmol/L Final  . Calcium 01/10/2015 8.7  8.6 - 10.2 mg/dL Final  . Total Protein 01/10/2015 6.1  6.0 - 8.5 g/dL Final  . Albumin 01/10/2015 3.8  3.5 - 4.8 g/dL Final  . Globulin, Total 01/10/2015 2.3  1.5 - 4.5 g/dL Final  . Albumin/Globulin Ratio 01/10/2015 1.7  1.1 - 2.5 Final  . Bilirubin Total 01/10/2015 0.3  0.0 - 1.2 mg/dL Final  . Alkaline Phosphatase 01/10/2015 92  39 - 117 IU/L Final  . AST 01/10/2015 12  0 - 40 IU/L Final  . ALT 01/10/2015 11  0 - 44 IU/L Final  . WBC 01/10/2015 7.1  3.4 - 10.8 x10E3/uL Final  . RBC 01/10/2015 4.38  4.14 - 5.80 x10E6/uL Final  . Hemoglobin 01/10/2015 11.7* 12.6 - 17.7 g/dL Final  . HCT 01/10/2015 35.0* 37.5 - 51.0 % Final  . MCV 01/10/2015 80  79 - 97 fL Final  . MCH 01/10/2015 26.7  26.6 - 33.0 pg Final  . MCHC 01/10/2015 33.4  31.5 - 35.7 g/dL Final  . RDW 01/10/2015 15.5* 12.3 - 15.4 % Final  . Platelets 01/10/2015 214  150 - 379 x10E3/uL Final  . Neutrophils Relative % 01/10/2015 59   Final  . Lymphs 01/10/2015 33   Final  . Monocytes 01/10/2015 6   Final  . Eos 01/10/2015 2   Final  . Basos 01/10/2015 0   Final  . Neutrophils Absolute 01/10/2015 4.1  1.4 - 7.0 x10E3/uL Final  . Lymphocytes Absolute 01/10/2015 2.4  0.7 - 3.1 x10E3/uL Final  . Monocytes Absolute 01/10/2015 0.5  0.1 - 0.9 x10E3/uL Final  . Eosinophils Absolute 01/10/2015 0.1  0.0 - 0.4 x10E3/uL Final  . Basophils Absolute 01/10/2015 0.0  0.0 - 0.2 x10E3/uL Final  . Immature Granulocytes 01/10/2015 0  Final  . Immature Grans (Abs) 01/10/2015 0.0  0.0 - 0.1 x10E3/uL Final  . Cholesterol, Total 01/10/2015 181  100 - 199 mg/dL Final  . Triglycerides 01/10/2015 96  0 - 149 mg/dL Final  . HDL 01/10/2015 43  >39 mg/dL Final   Comment: According to ATP-III Guidelines, HDL-C >59 mg/dL is considered a negative risk factor for  CHD.   Marland Kitchen VLDL Cholesterol Cal 01/10/2015 19  5 - 40 mg/dL Final  . LDL Calculated 01/10/2015 119* 0 - 99 mg/dL Final  . Chol/HDL Ratio 01/10/2015 4.2  0.0 - 5.0 ratio units Final   Comment:                                   T. Chol/HDL Ratio                                             Men  Women                               1/2 Avg.Risk  3.4    3.3                                   Avg.Risk  5.0    4.4                                2X Avg.Risk  9.6    7.1                                3X Avg.Risk 23.4   11.0   . Hgb A1c MFr Bld 01/10/2015 6.1* 4.8 - 5.6 % Final   Comment:          Pre-diabetes: 5.7 - 6.4          Diabetes: >6.4          Glycemic control for adults with diabetes: <7.0   . Est. average glucose Bld gHb Est-m* 01/10/2015 128   Final  . TSH 01/10/2015 1.440  0.450 - 4.500 uIU/mL Final  . specimen status report 01/10/2015 Comment   Final   Comment: Written Authorization Written Authorization Written Authorization Received. Authorization received from Sharon 01-14-2015 Logged by Caroline Sauger     No results found.   Assessment/Plan   ICD-9-CM ICD-10-CM   1. Primary osteoarthritis involving multiple joints 715.09 M15.0   2. Essential hypertension, benign - controlled 401.1 I10   3. DM type 2, controlled, with renal complications- cont ACE(-) and metformin 250.42 E11.29 Hemoglobin A1c    E11.65 Microalbumin/Creatinine Ratio, Urine  4. Bilateral edema of lower extremity - stable 782.3 R60.0   5. Hyperlipidemia - on statin 272.4 E78.5 Lipid Panel  6. Malignant neoplasm of prostate with bone mets- stable 185 C61   7. Pulmonary emphysema, unspecified emphysema type - asymptomatic at this time 492.8 J43.9     --check fasting labs lipid panel and A1c  --may take Advil sparingly for joint pain. Need to watch renal fxn closely. Last CMP by [facility] in March 2016  --continue current  meds as ordered  --f/u with urology in July as scheduled  --f/u in 4 mos  for CPE/medicare with ECG  Rune Mendez S. Perlie Gold  [facility] [address] [place], [place] [address] [phone] Cell (Monday-Friday 8 AM - 5 PM) [phone] After 5 PM and follow prompts

## 2015-04-17 LAB — MICROALBUMIN / CREATININE URINE RATIO
CREATININE, UR: 82.7 mg/dL
MICROALB/CREAT RATIO: 66 mg/g creat — ABNORMAL HIGH (ref 0.0–30.0)
Microalbumin, Urine: 54.6 ug/mL

## 2015-04-17 LAB — LIPID PANEL
CHOL/HDL RATIO: 4.3 ratio (ref 0.0–5.0)
CHOLESTEROL TOTAL: 160 mg/dL (ref 100–199)
HDL: 37 mg/dL — ABNORMAL LOW (ref >39)
LDL Calculated: 103 mg/dL — ABNORMAL HIGH (ref 0–99)
Triglycerides: 101 mg/dL (ref 0–149)
VLDL CHOLESTEROL CAL: 20 mg/dL (ref 5–40)

## 2015-04-17 LAB — HEMOGLOBIN A1C
Est. average glucose Bld gHb Est-mCnc: 137 mg/dL
HEMOGLOBIN A1C: 6.4 % — AB (ref 4.8–5.6)

## 2015-04-18 ENCOUNTER — Encounter: Payer: Self-pay | Admitting: *Deleted

## 2015-05-04 ENCOUNTER — Other Ambulatory Visit: Payer: Self-pay | Admitting: Internal Medicine

## 2015-05-09 DIAGNOSIS — Z08 Encounter for follow-up examination after completed treatment for malignant neoplasm: Secondary | ICD-10-CM | POA: Diagnosis not present

## 2015-05-09 DIAGNOSIS — Z85828 Personal history of other malignant neoplasm of skin: Secondary | ICD-10-CM | POA: Diagnosis not present

## 2015-05-16 HISTORY — PX: CYSTOSCOPY W/ URETERAL STENT PLACEMENT: SHX1429

## 2015-05-27 DIAGNOSIS — C7951 Secondary malignant neoplasm of bone: Secondary | ICD-10-CM | POA: Diagnosis not present

## 2015-05-27 DIAGNOSIS — M79605 Pain in left leg: Secondary | ICD-10-CM | POA: Diagnosis not present

## 2015-05-27 DIAGNOSIS — M84562D Pathological fracture in neoplastic disease, left tibia, subsequent encounter for fracture with routine healing: Secondary | ICD-10-CM | POA: Diagnosis not present

## 2015-05-27 DIAGNOSIS — C61 Malignant neoplasm of prostate: Secondary | ICD-10-CM | POA: Diagnosis not present

## 2015-05-27 DIAGNOSIS — M84469S Pathological fracture, unspecified tibia and fibula, sequela: Secondary | ICD-10-CM | POA: Diagnosis not present

## 2015-05-27 DIAGNOSIS — M25572 Pain in left ankle and joints of left foot: Secondary | ICD-10-CM | POA: Diagnosis not present

## 2015-05-29 DIAGNOSIS — Z01818 Encounter for other preprocedural examination: Secondary | ICD-10-CM | POA: Diagnosis not present

## 2015-05-29 DIAGNOSIS — N183 Chronic kidney disease, stage 3 (moderate): Secondary | ICD-10-CM | POA: Diagnosis not present

## 2015-05-29 DIAGNOSIS — E119 Type 2 diabetes mellitus without complications: Secondary | ICD-10-CM | POA: Diagnosis not present

## 2015-05-29 DIAGNOSIS — D649 Anemia, unspecified: Secondary | ICD-10-CM | POA: Diagnosis not present

## 2015-05-29 DIAGNOSIS — C61 Malignant neoplasm of prostate: Secondary | ICD-10-CM | POA: Diagnosis not present

## 2015-05-29 DIAGNOSIS — I10 Essential (primary) hypertension: Secondary | ICD-10-CM | POA: Diagnosis not present

## 2015-05-30 ENCOUNTER — Other Ambulatory Visit: Payer: Self-pay | Admitting: Internal Medicine

## 2015-06-03 DIAGNOSIS — K219 Gastro-esophageal reflux disease without esophagitis: Secondary | ICD-10-CM | POA: Diagnosis not present

## 2015-06-03 DIAGNOSIS — Z7982 Long term (current) use of aspirin: Secondary | ICD-10-CM | POA: Diagnosis not present

## 2015-06-03 DIAGNOSIS — N183 Chronic kidney disease, stage 3 (moderate): Secondary | ICD-10-CM | POA: Diagnosis not present

## 2015-06-03 DIAGNOSIS — I129 Hypertensive chronic kidney disease with stage 1 through stage 4 chronic kidney disease, or unspecified chronic kidney disease: Secondary | ICD-10-CM | POA: Diagnosis not present

## 2015-06-03 DIAGNOSIS — E119 Type 2 diabetes mellitus without complications: Secondary | ICD-10-CM | POA: Diagnosis not present

## 2015-06-03 DIAGNOSIS — Z8546 Personal history of malignant neoplasm of prostate: Secondary | ICD-10-CM | POA: Diagnosis not present

## 2015-06-03 DIAGNOSIS — N1330 Unspecified hydronephrosis: Secondary | ICD-10-CM | POA: Diagnosis not present

## 2015-06-03 DIAGNOSIS — Z87891 Personal history of nicotine dependence: Secondary | ICD-10-CM | POA: Diagnosis not present

## 2015-06-03 DIAGNOSIS — C61 Malignant neoplasm of prostate: Secondary | ICD-10-CM | POA: Diagnosis not present

## 2015-06-03 DIAGNOSIS — N359 Urethral stricture, unspecified: Secondary | ICD-10-CM | POA: Diagnosis not present

## 2015-06-03 DIAGNOSIS — Z466 Encounter for fitting and adjustment of urinary device: Secondary | ICD-10-CM | POA: Diagnosis not present

## 2015-06-03 DIAGNOSIS — I10 Essential (primary) hypertension: Secondary | ICD-10-CM | POA: Diagnosis not present

## 2015-07-01 DIAGNOSIS — C7951 Secondary malignant neoplasm of bone: Secondary | ICD-10-CM | POA: Diagnosis not present

## 2015-07-01 DIAGNOSIS — M84469D Pathological fracture, unspecified tibia and fibula, subsequent encounter for fracture with routine healing: Secondary | ICD-10-CM | POA: Diagnosis not present

## 2015-07-22 ENCOUNTER — Other Ambulatory Visit: Payer: Self-pay | Admitting: Internal Medicine

## 2015-08-04 DIAGNOSIS — C61 Malignant neoplasm of prostate: Secondary | ICD-10-CM | POA: Diagnosis not present

## 2015-08-04 DIAGNOSIS — C7951 Secondary malignant neoplasm of bone: Secondary | ICD-10-CM | POA: Diagnosis not present

## 2015-08-12 DIAGNOSIS — R600 Localized edema: Secondary | ICD-10-CM | POA: Diagnosis not present

## 2015-08-12 DIAGNOSIS — Z87311 Personal history of (healed) other pathological fracture: Secondary | ICD-10-CM | POA: Diagnosis not present

## 2015-08-12 DIAGNOSIS — M25561 Pain in right knee: Secondary | ICD-10-CM | POA: Diagnosis not present

## 2015-08-12 DIAGNOSIS — Z08 Encounter for follow-up examination after completed treatment for malignant neoplasm: Secondary | ICD-10-CM | POA: Diagnosis not present

## 2015-08-12 DIAGNOSIS — Z923 Personal history of irradiation: Secondary | ICD-10-CM | POA: Diagnosis not present

## 2015-08-12 DIAGNOSIS — Z8546 Personal history of malignant neoplasm of prostate: Secondary | ICD-10-CM | POA: Diagnosis not present

## 2015-08-12 DIAGNOSIS — M25562 Pain in left knee: Secondary | ICD-10-CM | POA: Diagnosis not present

## 2015-08-12 DIAGNOSIS — C61 Malignant neoplasm of prostate: Secondary | ICD-10-CM | POA: Diagnosis not present

## 2015-08-12 DIAGNOSIS — M25512 Pain in left shoulder: Secondary | ICD-10-CM | POA: Diagnosis not present

## 2015-08-12 DIAGNOSIS — M25511 Pain in right shoulder: Secondary | ICD-10-CM | POA: Diagnosis not present

## 2015-08-19 DIAGNOSIS — N183 Chronic kidney disease, stage 3 (moderate): Secondary | ICD-10-CM | POA: Diagnosis not present

## 2015-08-19 DIAGNOSIS — Z466 Encounter for fitting and adjustment of urinary device: Secondary | ICD-10-CM | POA: Diagnosis not present

## 2015-08-19 DIAGNOSIS — I129 Hypertensive chronic kidney disease with stage 1 through stage 4 chronic kidney disease, or unspecified chronic kidney disease: Secondary | ICD-10-CM | POA: Diagnosis not present

## 2015-08-19 DIAGNOSIS — N135 Crossing vessel and stricture of ureter without hydronephrosis: Secondary | ICD-10-CM | POA: Diagnosis not present

## 2015-08-19 DIAGNOSIS — E669 Obesity, unspecified: Secondary | ICD-10-CM | POA: Diagnosis not present

## 2015-08-19 DIAGNOSIS — Z7982 Long term (current) use of aspirin: Secondary | ICD-10-CM | POA: Diagnosis not present

## 2015-08-19 DIAGNOSIS — Z7984 Long term (current) use of oral hypoglycemic drugs: Secondary | ICD-10-CM | POA: Diagnosis not present

## 2015-08-19 DIAGNOSIS — Z87891 Personal history of nicotine dependence: Secondary | ICD-10-CM | POA: Diagnosis not present

## 2015-08-19 DIAGNOSIS — Z8546 Personal history of malignant neoplasm of prostate: Secondary | ICD-10-CM | POA: Diagnosis not present

## 2015-08-19 DIAGNOSIS — R079 Chest pain, unspecified: Secondary | ICD-10-CM | POA: Diagnosis not present

## 2015-08-19 DIAGNOSIS — N320 Bladder-neck obstruction: Secondary | ICD-10-CM | POA: Diagnosis not present

## 2015-08-19 DIAGNOSIS — E119 Type 2 diabetes mellitus without complications: Secondary | ICD-10-CM | POA: Diagnosis not present

## 2015-08-19 DIAGNOSIS — K219 Gastro-esophageal reflux disease without esophagitis: Secondary | ICD-10-CM | POA: Diagnosis not present

## 2015-08-20 ENCOUNTER — Ambulatory Visit (INDEPENDENT_AMBULATORY_CARE_PROVIDER_SITE_OTHER): Payer: Medicare Other | Admitting: Internal Medicine

## 2015-08-20 ENCOUNTER — Encounter: Payer: Self-pay | Admitting: Internal Medicine

## 2015-08-20 VITALS — BP 130/76 | HR 63 | Temp 97.9°F | Resp 14 | Ht 71.5 in | Wt 262.0 lb

## 2015-08-20 DIAGNOSIS — Z23 Encounter for immunization: Secondary | ICD-10-CM

## 2015-08-20 DIAGNOSIS — J439 Emphysema, unspecified: Secondary | ICD-10-CM | POA: Diagnosis not present

## 2015-08-20 DIAGNOSIS — E785 Hyperlipidemia, unspecified: Secondary | ICD-10-CM | POA: Diagnosis not present

## 2015-08-20 DIAGNOSIS — E0821 Diabetes mellitus due to underlying condition with diabetic nephropathy: Secondary | ICD-10-CM

## 2015-08-20 DIAGNOSIS — R600 Localized edema: Secondary | ICD-10-CM

## 2015-08-20 DIAGNOSIS — I10 Essential (primary) hypertension: Secondary | ICD-10-CM | POA: Diagnosis not present

## 2015-08-20 DIAGNOSIS — K219 Gastro-esophageal reflux disease without esophagitis: Secondary | ICD-10-CM | POA: Diagnosis not present

## 2015-08-20 NOTE — Patient Instructions (Signed)
Start OTC prilosec daily x 4 weeks then use as needed for reflux. Wait until after procedure before starting medication  Continue other medications as ordered  Follow up in 4 mos for routine visit  Keep appt with specialists as scheduled  Gastroesophageal Reflux Disease, Adult Normally, food travels down the esophagus and stays in the stomach to be digested. If a person has gastroesophageal reflux disease (GERD), food and stomach acid move back up into the esophagus. When this happens, the esophagus becomes sore and swollen (inflamed). Over time, GERD can make small holes (ulcers) in the lining of the esophagus. HOME CARE Diet  Follow a diet as told by your doctor. You may need to avoid foods and drinks such as:  Coffee and tea (with or without caffeine).  Drinks that contain alcohol.  Energy drinks and sports drinks.  Carbonated drinks or sodas.  Chocolate and cocoa.  Peppermint and mint flavorings.  Garlic and onions.  Horseradish.  Spicy and acidic foods, such as peppers, chili powder, curry powder, vinegar, hot sauces, and BBQ sauce.  Citrus fruit juices and citrus fruits, such as oranges, lemons, and limes.  Tomato-based foods, such as red sauce, chili, salsa, and pizza with red sauce.  Fried and fatty foods, such as donuts, french fries, potato chips, and high-fat dressings.  High-fat meats, such as hot dogs, rib eye steak, sausage, ham, and bacon.  High-fat dairy items, such as whole milk, butter, and cream cheese.  Eat small meals often. Avoid eating large meals.  Avoid drinking large amounts of liquid with your meals.  Avoid eating meals during the 2-3 hours before bedtime.  Avoid lying down right after you eat.  Do not exercise right after you eat. General Instructions  Pay attention to any changes in your symptoms.  Take over-the-counter and prescription medicines only as told by your doctor. Do not take aspirin, ibuprofen, or other NSAIDs unless  your doctor says it is okay.  Do not use any tobacco products, including cigarettes, chewing tobacco, and e-cigarettes. If you need help quitting, ask your doctor.  Wear loose clothes. Do not wear anything tight around your waist.  Raise (elevate) the head of your bed about 6 inches (15 cm).  Try to lower your stress. If you need help doing this, ask your doctor.  If you are overweight, lose an amount of weight that is healthy for you. Ask your doctor about a safe weight loss goal.  Keep all follow-up visits as told by your doctor. This is important. GET HELP IF:  You have new symptoms.  You lose weight and you do not know why it is happening.  You have trouble swallowing, or it hurts to swallow.  You have wheezing or a cough that keeps happening.  Your symptoms do not get better with treatment.  You have a hoarse voice. GET HELP RIGHT AWAY IF:  You have pain in your arms, neck, jaw, teeth, or back.  You feel sweaty, dizzy, or light-headed.  You have chest pain or shortness of breath.  You throw up (vomit) and your throw up looks like blood or coffee grounds.  You pass out (faint).  Your poop (stool) is bloody or black.  You cannot swallow, drink, or eat.   This information is not intended to replace advice given to you by your health care provider. Make sure you discuss any questions you have with your health care provider.   Document Released: 04/19/2008 Document Revised: 07/23/2015 Document Reviewed: 02/26/2015 Elsevier Interactive  Patient Education 2016 [employer].

## 2015-08-20 NOTE — Progress Notes (Signed)
Patient ID: Jeremiah Collins, male   DOB: 09-03-1934, 81 y.o.   MRN: [id number]    Location:    [facility]   Place of Service:  OFFICE  Chief Complaint  Patient presents with  . Medical Management of Chronic Issues    HTN, DM and high cholesterol. Not fasting today  . Heartburn    Ongoing concern of heartburn, would like to discuss therapy   . FYI    Last CPX was 09/11/15    HPI:  81 yo male seen today for f/u. He has burning sensation in esophagus that radiates into jaw at times and is related to what he eats. Sensation relieved with Tums. He has never tried PPI. No N/V. No abdominal pain. No constipation or diarrhea. No alarm sx's  DM - BS 110-120s. No low BS reactions. No numbness or tingling. He takes metformin  HTN - BP stable on lisinopril-HCT and metoprolol  Hyperlipidemia - stable on statin. Occasional muscle weakness.  Prostate CA - scheduled to see [facility] providers next week. He takes eligard 45mg   injections every 6 mos. He is scheduled for a procedure to change urethral stent next week. He is taking cardura also. He has to use self cath at night but no issues voiding during the day.  Past Medical History  Diagnosis Date  . HTN (hypertension)   . Other and unspecified hyperlipidemia 01/02/2013  . Routine general medical examination at a health care facility   . Acute bronchitis 09/12/2012  . Cellulitis and abscess of leg, except foot 01/17/2012  . Gross hematuria 09/06/2011  . Osteoarthrosis involving, or with mention of more than one site, but not specified as generalized, multiple sites 10/22/2010  . Reflux esophagitis 05/10/2008  . First degree atrioventricular block 04/25/2007  . 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 Palpitations 04/28/2004  . Chest pain, unspecified 04/28/2004  . Closed fracture of five ribs 03/24/2004  . Spinal stenosis, unspecified region other than cervical 04/29/1995  . Other abnormal blood chemistry 04/29/1991  . Cataract     Dr.Groat  . Type II  or unspecified type diabetes mellitus without mention of complication, uncontrolled   . Prostate cancer ([place]) 12/16/1994    gleason 7  . Hx of radiation therapy 11/1998    prostate fossa - 6040 cGy, 33 fx, Dr Danny Lawless  . Bone metastases ([place])     left mid tibial shaft  . History of radiation therapy 04/03/14- 04/17/14    mid to distal left tibia 3000 cGy in 10 sessions    Past Surgical History  Procedure Laterality Date  . Left ureteral stent placement  Week of 12/05/11  . Prostatectomy  1996    Dr. Rosana Hoes  . Eye surgery  2006    cataract, Dr Shanon Rosser  . Cystoscopy w/ ureteral stent placement  12/31/14  . Cystoscopy w/ ureteral stent placement  05/16/2015    Patient Care Team: [name], DO as PCP - General (Internal Medicine) Myrlene Broker, MD as Attending Physician (Urology) [name], MD as Consulting Physician (Ophthalmology) [name], MD as Consulting Physician (Ophthalmology)  Social History   Social History  . Marital Status: Married    Spouse Name: N/A  . Number of Children: N/A  . Years of Education: N/A   Occupational History  . Not on file.   Social History Main Topics  . Smoking status: Former Smoker    Types: Cigars    Quit date: 12/16/1968  . Smokeless tobacco: Never Used  . Alcohol Use:  No  . Drug Use: No  . Sexual Activity: Not on file   Other Topics Concern  . Not on file   Social History Narrative     reports that he quit smoking about 46 years ago. His smoking use included Cigars. He has never used smokeless tobacco. He reports that he does not drink alcohol or use illicit drugs.  Allergies  Allergen Reactions  . Abiraterone Other (See Comments) and Nausea And Vomiting    Medications: Patient's Medications  New Prescriptions   No medications on file  Previous Medications   ASPIRIN 81 MG TABLET    Take 1 tablet (81 mg total) by mouth daily.   CHOLECALCIFEROL 1000 UNITS TABLET    Take 1,000 Units by mouth daily.   DOXAZOSIN  (CARDURA) 4 MG TABLET    TAKE ONE TABLET BY MOUTH ONCE DAILY   GLUCOSE BLOOD (ONETOUCH VERIO) TEST STRIP    Check blood sugar 1-2 times weekly.  DX: 250.00   LISINOPRIL-HYDROCHLOROTHIAZIDE (ZESTORETIC) 20-12.5 MG PER TABLET    Take 1 tablet by mouth daily.   METFORMIN (GLUCOPHAGE) 500 MG TABLET    TAKE ONE TABLET BY MOUTH ONCE DAILY WITH BREAKFAST   METOPROLOL TARTRATE (LOPRESSOR) 25 MG TABLET    Take 1 tablet (25 mg total) by mouth daily.   OMEGA-3 FATTY ACIDS (FISH OIL) 1000 MG CAPS    Take 1 capsule (1,000 mg total) by mouth daily.   ONETOUCH DELICA LANCETS 25K MISC    Check blood sugar 1 time weekly.   DX: 250.00   SIMVASTATIN (ZOCOR) 5 MG TABLET    TAKE ONE TABLET BY MOUTH DAILY  Modified Medications   No medications on file  Discontinued Medications   METOPROLOL TARTRATE (LOPRESSOR) 25 MG TABLET    TAKE ONE TABLET BY MOUTH TWICE DAILY    Review of Systems  Constitutional: Negative for chills, activity change and fatigue.  HENT: Negative for sore throat and trouble swallowing.   Eyes: Negative for visual disturbance.  Respiratory: Negative for cough, chest tightness and shortness of breath.   Cardiovascular: Positive for leg swelling. Negative for chest pain and palpitations.  Gastrointestinal: Negative for nausea, vomiting, abdominal pain and blood in stool.  Genitourinary: Positive for difficulty urinating. Negative for urgency and frequency.  Musculoskeletal: Positive for arthralgias and gait problem.  Skin: Negative for rash.  Neurological: Negative for weakness and headaches.  Psychiatric/Behavioral: Negative for confusion and sleep disturbance. The patient is not nervous/anxious.     Filed Vitals:   08/20/15 0946  BP: 130/76  Pulse: 63  Temp: 97.9 F (36.6 C)  TempSrc: Oral  Resp: 14  Height: 5' 11.5" (1.816 m)  Weight: 262 lb (118.842 kg)  SpO2: 95%   Body mass index is 36.04 kg/(m^2).  Physical Exam  Constitutional: He is oriented to person, place, and time. He  appears well-developed and well-nourished. No distress.  HENT:  Mouth/Throat: Oropharynx is clear and moist.  Eyes: Pupils are equal, round, and reactive to light. No scleral icterus.  Neck: Neck supple. Carotid bruit is not present. No thyromegaly present.  Cardiovascular: Normal rate, regular rhythm, normal heart sounds and intact distal pulses.  Exam reveals no gallop and no friction rub.   No murmur heard. +1 pitting L>R LE swelling. No calf TTP  Pulmonary/Chest: Effort normal and breath sounds normal. He has no wheezes. He has no rales. He exhibits no tenderness.  Abdominal: Soft. Bowel sounds are normal. He exhibits no distension, no abdominal bruit, no pulsatile  midline mass and no mass. There is tenderness (epigastric). There is no rebound and no guarding.  Musculoskeletal:  No TMJ TTP  Lymphadenopathy:    He has no cervical adenopathy.  Neurological: He is alert and oriented to person, place, and time. He has normal reflexes.  Skin: Skin is warm and dry. No rash noted.  Psychiatric: He has a normal mood and affect. His behavior is normal. Thought content normal.     Labs reviewed: No visits with results within 3 Month(s) from this visit. Latest known visit with results is:  Office Visit on 04/16/2015  Component Date Value Ref Range Status  . Hgb A1c MFr Bld 04/16/2015 6.4* 4.8 - 5.6 % Final   Comment:          Pre-diabetes: 5.7 - 6.4          Diabetes: >6.4          Glycemic control for adults with diabetes: <7.0   . Est. average glucose Bld gHb Est-m* 04/16/2015 137   Final  . Cholesterol, Total 04/16/2015 160  100 - 199 mg/dL Final  . Triglycerides 04/16/2015 101  0 - 149 mg/dL Final  . HDL 04/16/2015 37* >39 mg/dL Final   Comment: According to ATP-III Guidelines, HDL-C >59 mg/dL is considered a negative risk factor for CHD.   Marland Kitchen VLDL Cholesterol Cal 04/16/2015 20  5 - 40 mg/dL Final  . LDL Calculated 04/16/2015 103* 0 - 99 mg/dL Final  . Chol/HDL Ratio 04/16/2015  4.3  0.0 - 5.0 ratio units Final   Comment:                                   T. Chol/HDL Ratio                                             Men  Women                               1/2 Avg.Risk  3.4    3.3                                   Avg.Risk  5.0    4.4                                2X Avg.Risk  9.6    7.1                                3X Avg.Risk 23.4   11.0   . Creatinine, Urine 04/16/2015 82.7  Not Estab. mg/dL Final                 **Please note reference interval change**  . Microalbum.,U,Random 04/16/2015 54.6  Not Estab. ug/mL Final                 **Please note reference interval change**  . MICROALB/CREAT RATIO 04/16/2015 66.0* 0.0 - 30.0 mg/g creat Final    No results found.   Assessment/Plan   ICD-9-CM ICD-10-CM   1.  Gastroesophageal reflux disease without esophagitis 530.81 K21.9   2. Encounter for immunization Z23 Z23   3. Essential hypertension, benign 401.1 I10   4. Diabetes mellitus due to underlying condition, controlled, with diabetic nephropathy, without long-term current use of insulin (HCC) 249.40 E08.21 Hemoglobin A1c   583.81    5. Pulmonary emphysema, unspecified emphysema type ([place]) 492.8 J43.9   6. Hyperlipidemia 272.4 E78.5   7. Bilateral edema of lower extremity 782.3 R60.0     --he would like to not schedule CPE due to multiple provider appts related to prostate CA  --flu shot given today  Start OTC prilosec daily x 4 weeks then use as needed for reflux. Wait until after procedure before starting medication  Continue other medications as ordered  Follow up in 4 mos for routine visit  Keep appt with specialists as scheduled  Kiaja Shorty S. Perlie Gold  [facility] and Adult Medicine [address] [place], [place] [address] [phone] Cell (Monday-Friday 8 AM - 5 PM) [phone] After 5 PM and follow prompts

## 2015-08-21 LAB — HEMOGLOBIN A1C
Est. average glucose Bld gHb Est-mCnc: 137 mg/dL
Hgb A1c MFr Bld: 6.4 % — ABNORMAL HIGH (ref 4.8–5.6)

## 2015-08-26 DIAGNOSIS — N320 Bladder-neck obstruction: Secondary | ICD-10-CM | POA: Diagnosis not present

## 2015-08-26 DIAGNOSIS — N1330 Unspecified hydronephrosis: Secondary | ICD-10-CM | POA: Diagnosis not present

## 2015-08-26 DIAGNOSIS — Z466 Encounter for fitting and adjustment of urinary device: Secondary | ICD-10-CM | POA: Diagnosis not present

## 2015-08-26 DIAGNOSIS — Z8546 Personal history of malignant neoplasm of prostate: Secondary | ICD-10-CM | POA: Diagnosis not present

## 2015-08-26 DIAGNOSIS — E119 Type 2 diabetes mellitus without complications: Secondary | ICD-10-CM | POA: Diagnosis not present

## 2015-08-26 DIAGNOSIS — N135 Crossing vessel and stricture of ureter without hydronephrosis: Secondary | ICD-10-CM | POA: Diagnosis not present

## 2015-08-26 DIAGNOSIS — I129 Hypertensive chronic kidney disease with stage 1 through stage 4 chronic kidney disease, or unspecified chronic kidney disease: Secondary | ICD-10-CM | POA: Diagnosis not present

## 2015-08-30 ENCOUNTER — Other Ambulatory Visit: Payer: Self-pay | Admitting: Internal Medicine

## 2015-10-14 DIAGNOSIS — Z79899 Other long term (current) drug therapy: Secondary | ICD-10-CM | POA: Diagnosis not present

## 2015-10-14 DIAGNOSIS — I129 Hypertensive chronic kidney disease with stage 1 through stage 4 chronic kidney disease, or unspecified chronic kidney disease: Secondary | ICD-10-CM | POA: Diagnosis not present

## 2015-10-14 DIAGNOSIS — Z9079 Acquired absence of other genital organ(s): Secondary | ICD-10-CM | POA: Diagnosis not present

## 2015-10-14 DIAGNOSIS — Z7982 Long term (current) use of aspirin: Secondary | ICD-10-CM | POA: Diagnosis not present

## 2015-10-14 DIAGNOSIS — Z9889 Other specified postprocedural states: Secondary | ICD-10-CM | POA: Diagnosis not present

## 2015-10-14 DIAGNOSIS — R531 Weakness: Secondary | ICD-10-CM | POA: Diagnosis not present

## 2015-10-14 DIAGNOSIS — N183 Chronic kidney disease, stage 3 (moderate): Secondary | ICD-10-CM | POA: Diagnosis not present

## 2015-10-14 DIAGNOSIS — E1122 Type 2 diabetes mellitus with diabetic chronic kidney disease: Secondary | ICD-10-CM | POA: Diagnosis not present

## 2015-10-14 DIAGNOSIS — R600 Localized edema: Secondary | ICD-10-CM | POA: Diagnosis not present

## 2015-10-14 DIAGNOSIS — Z888 Allergy status to other drugs, medicaments and biological substances status: Secondary | ICD-10-CM | POA: Diagnosis not present

## 2015-10-14 DIAGNOSIS — C61 Malignant neoplasm of prostate: Secondary | ICD-10-CM | POA: Diagnosis not present

## 2015-10-14 DIAGNOSIS — Z7984 Long term (current) use of oral hypoglycemic drugs: Secondary | ICD-10-CM | POA: Diagnosis not present

## 2015-11-03 ENCOUNTER — Other Ambulatory Visit: Payer: Self-pay | Admitting: Internal Medicine

## 2015-11-24 ENCOUNTER — Other Ambulatory Visit: Payer: Self-pay | Admitting: Internal Medicine

## 2015-11-25 DIAGNOSIS — Z8546 Personal history of malignant neoplasm of prostate: Secondary | ICD-10-CM | POA: Diagnosis not present

## 2015-11-25 DIAGNOSIS — K219 Gastro-esophageal reflux disease without esophagitis: Secondary | ICD-10-CM | POA: Diagnosis not present

## 2015-11-25 DIAGNOSIS — N183 Chronic kidney disease, stage 3 (moderate): Secondary | ICD-10-CM | POA: Diagnosis not present

## 2015-11-25 DIAGNOSIS — E785 Hyperlipidemia, unspecified: Secondary | ICD-10-CM | POA: Diagnosis not present

## 2015-11-25 DIAGNOSIS — J439 Emphysema, unspecified: Secondary | ICD-10-CM | POA: Diagnosis not present

## 2015-11-25 DIAGNOSIS — N529 Male erectile dysfunction, unspecified: Secondary | ICD-10-CM | POA: Diagnosis not present

## 2015-11-25 DIAGNOSIS — E119 Type 2 diabetes mellitus without complications: Secondary | ICD-10-CM | POA: Diagnosis not present

## 2015-11-25 DIAGNOSIS — I129 Hypertensive chronic kidney disease with stage 1 through stage 4 chronic kidney disease, or unspecified chronic kidney disease: Secondary | ICD-10-CM | POA: Diagnosis not present

## 2015-11-25 DIAGNOSIS — Z7984 Long term (current) use of oral hypoglycemic drugs: Secondary | ICD-10-CM | POA: Diagnosis not present

## 2015-11-25 DIAGNOSIS — Z466 Encounter for fitting and adjustment of urinary device: Secondary | ICD-10-CM | POA: Diagnosis not present

## 2015-11-25 DIAGNOSIS — E669 Obesity, unspecified: Secondary | ICD-10-CM | POA: Diagnosis not present

## 2015-11-25 DIAGNOSIS — N1330 Unspecified hydronephrosis: Secondary | ICD-10-CM | POA: Diagnosis not present

## 2015-11-25 DIAGNOSIS — Z7982 Long term (current) use of aspirin: Secondary | ICD-10-CM | POA: Diagnosis not present

## 2015-11-25 DIAGNOSIS — R319 Hematuria, unspecified: Secondary | ICD-10-CM | POA: Diagnosis not present

## 2015-11-25 DIAGNOSIS — Z87891 Personal history of nicotine dependence: Secondary | ICD-10-CM | POA: Diagnosis not present

## 2015-11-25 DIAGNOSIS — N320 Bladder-neck obstruction: Secondary | ICD-10-CM | POA: Diagnosis not present

## 2015-11-25 DIAGNOSIS — N359 Urethral stricture, unspecified: Secondary | ICD-10-CM | POA: Diagnosis not present

## 2015-12-03 ENCOUNTER — Other Ambulatory Visit: Payer: Self-pay | Admitting: Internal Medicine

## 2015-12-11 DIAGNOSIS — R609 Edema, unspecified: Secondary | ICD-10-CM | POA: Diagnosis not present

## 2015-12-11 DIAGNOSIS — R5383 Other fatigue: Secondary | ICD-10-CM | POA: Diagnosis not present

## 2015-12-11 DIAGNOSIS — Z7982 Long term (current) use of aspirin: Secondary | ICD-10-CM | POA: Diagnosis not present

## 2015-12-11 DIAGNOSIS — I10 Essential (primary) hypertension: Secondary | ICD-10-CM | POA: Diagnosis not present

## 2015-12-11 DIAGNOSIS — Z5111 Encounter for antineoplastic chemotherapy: Secondary | ICD-10-CM | POA: Diagnosis not present

## 2015-12-11 DIAGNOSIS — M629 Disorder of muscle, unspecified: Secondary | ICD-10-CM | POA: Diagnosis not present

## 2015-12-11 DIAGNOSIS — C61 Malignant neoplasm of prostate: Secondary | ICD-10-CM | POA: Diagnosis not present

## 2015-12-24 ENCOUNTER — Ambulatory Visit (INDEPENDENT_AMBULATORY_CARE_PROVIDER_SITE_OTHER): Payer: Medicare Other | Admitting: Internal Medicine

## 2015-12-24 ENCOUNTER — Encounter: Payer: Self-pay | Admitting: Internal Medicine

## 2015-12-24 VITALS — BP 156/80 | HR 69 | Temp 97.7°F | Resp 20 | Ht 72.0 in | Wt 266.4 lb

## 2015-12-24 DIAGNOSIS — C61 Malignant neoplasm of prostate: Secondary | ICD-10-CM

## 2015-12-24 DIAGNOSIS — R600 Localized edema: Secondary | ICD-10-CM

## 2015-12-24 DIAGNOSIS — K219 Gastro-esophageal reflux disease without esophagitis: Secondary | ICD-10-CM | POA: Diagnosis not present

## 2015-12-24 DIAGNOSIS — E0821 Diabetes mellitus due to underlying condition with diabetic nephropathy: Secondary | ICD-10-CM | POA: Diagnosis not present

## 2015-12-24 DIAGNOSIS — I10 Essential (primary) hypertension: Secondary | ICD-10-CM | POA: Diagnosis not present

## 2015-12-24 DIAGNOSIS — E785 Hyperlipidemia, unspecified: Secondary | ICD-10-CM

## 2015-12-24 DIAGNOSIS — J439 Emphysema, unspecified: Secondary | ICD-10-CM | POA: Diagnosis not present

## 2015-12-24 NOTE — Patient Instructions (Signed)
Continue current medications as ordered  May take Tums as needed for indigestion. May also take prilosec as needed.  Follow up with specialists as scheduled.  Follow up in 4 mos for routine visit. Will check fasting labs prior to appt

## 2015-12-24 NOTE — Progress Notes (Signed)
Patient ID: Jeremiah Collins, male   DOB: 1934/08/15, 82 y.o.   MRN: [id number]    Location:    [facility]   Place of Service:  OFFICE   Chief Complaint  Patient presents with  . Medical Management of Chronic Issues    4 month follow-up for routine visit    HPI:  82 yo male seen today for f/u.    GERD - sx 's improved since last OV. He takes prn OTC prilosec. tums works well also  DM - BS 120s usually. No low BS reactions. No numbness or tingling. He takes metformin. A1c 6.4%  HTN - BP stable on lisinopril-HCT and metoprolol. Takes ASA daily  Hyperlipidemia - stable on statin. Occasional muscle weakness.  Prostate CA - scheduled to see [facility] providers next week. He takes eligard 45mg   injections every 6 mos. Last one about 4 weeks ago. He had procedure to change urethral stent last month (changed every 3 mos). He is taking cardura also. He has to use self cath at night but no issues voiding during the day. He has gained 4 lbs since last OV  Past Medical History  Diagnosis Date  . HTN (hypertension)   . Other and unspecified hyperlipidemia 01/02/2013  . Routine general medical examination at a health care facility   . Acute bronchitis 09/12/2012  . Cellulitis and abscess of leg, except foot 01/17/2012  . Gross hematuria 09/06/2011  . Osteoarthrosis involving, or with mention of more than one site, but not specified as generalized, multiple sites 10/22/2010  . Reflux esophagitis 05/10/2008  . First degree atrioventricular block 04/25/2007  . 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 Palpitations 04/28/2004  . Chest pain, unspecified 04/28/2004  . Closed fracture of five ribs 03/24/2004  . Spinal stenosis, unspecified region other than cervical 04/29/1995  . Other abnormal blood chemistry 04/29/1991  . Cataract     Dr.Groat  . Type II or unspecified type diabetes mellitus without mention of complication, uncontrolled   . Prostate cancer ([place]) 12/16/1994    gleason 7  . Hx of radiation  therapy 11/1998    prostate fossa - 6040 cGy, 33 fx, Dr Danny Lawless  . Bone metastases ([place])     left mid tibial shaft  . History of radiation therapy 04/03/14- 04/17/14    mid to distal left tibia 3000 cGy in 10 sessions    Past Surgical History  Procedure Laterality Date  . Left ureteral stent placement  Week of 12/05/11  . Prostatectomy  1996    Dr. Rosana Hoes  . Eye surgery  2006    cataract, Dr Shanon Rosser  . Cystoscopy w/ ureteral stent placement  12/31/14  . Cystoscopy w/ ureteral stent placement  05/16/2015    Patient Care Team: [name], DO as PCP - General (Internal Medicine) Myrlene Broker, MD as Attending Physician (Urology) [name], MD as Consulting Physician (Ophthalmology) [name], MD as Consulting Physician (Ophthalmology)  Social History   Social History  . Marital Status: Married    Spouse Name: N/A  . Number of Children: N/A  . Years of Education: N/A   Occupational History  . Not on file.   Social History Main Topics  . Smoking status: Former Smoker    Types: Cigars    Quit date: 12/16/1968  . Smokeless tobacco: Never Used  . Alcohol Use: No  . Drug Use: No  . Sexual Activity: Not on file   Other Topics Concern  . Not on file   Social  History Narrative     reports that he quit smoking about 47 years ago. His smoking use included Cigars. He has never used smokeless tobacco. He reports that he does not drink alcohol or use illicit drugs.  Allergies  Allergen Reactions  . Abiraterone Other (See Comments) and Nausea And Vomiting    Medications: Patient's Medications  New Prescriptions   No medications on file  Previous Medications   ASPIRIN 81 MG TABLET    Take 1 tablet (81 mg total) by mouth daily.   CHOLECALCIFEROL 1000 UNITS TABLET    Take 1,000 Units by mouth daily.   DOXAZOSIN (CARDURA) 4 MG TABLET    TAKE ONE TABLET BY MOUTH ONCE DAILY   GLUCOSE BLOOD (ONETOUCH VERIO) TEST STRIP    Check blood sugar 1-2 times weekly.  DX: 250.00     LEUPROLIDE, 6 MONTH, (LEUPROLIDE ACETATE, 6 MONTH,) 45 MG INJECTION    Inject 45 mg into the skin every 6 (six) months. Filled by [facility] (ZESTORETIC) 20-12.5 MG PER TABLET    Take 1 tablet by mouth daily.   METFORMIN (GLUCOPHAGE) 500 MG TABLET    TAKE ONE TABLET BY MOUTH ONCE DAILY WITH  BREAKFAST   METOPROLOL TARTRATE (LOPRESSOR) 25 MG TABLET    Take 1 tablet (25 mg total) by mouth daily.   OMEGA-3 FATTY ACIDS (FISH OIL) 1000 MG CAPS    Take 1 capsule (1,000 mg total) by mouth daily.   ONETOUCH DELICA LANCETS 99991111 MISC    Check blood sugar 1 time weekly.   DX: 250.00   ONETOUCH VERIO TEST STRIP    CHECK BLOOD SUGAR 1-2 TIMES WEEKLY   SIMVASTATIN (ZOCOR) 5 MG TABLET    TAKE ONE TABLET BY MOUTH DAILY  Modified Medications   No medications on file  Discontinued Medications   No medications on file    Review of Systems  Constitutional: Positive for fatigue and unexpected weight change. Negative for chills and activity change.  HENT: Negative for sore throat and trouble swallowing.   Eyes: Negative for visual disturbance.  Respiratory: Negative for cough, chest tightness and shortness of breath.   Cardiovascular: Negative for chest pain, palpitations and leg swelling.  Gastrointestinal: Negative for nausea, vomiting, abdominal pain and blood in stool.  Genitourinary: Negative for urgency, frequency and difficulty urinating.  Musculoskeletal: Positive for arthralgias. Negative for gait problem.  Skin: Negative for rash.  Neurological: Negative for weakness and headaches.  Psychiatric/Behavioral: Negative for confusion and sleep disturbance. The patient is not nervous/anxious.     Filed Vitals:   12/24/15 0900  BP: 140/100  Pulse: 69  Temp: 97.7 F (36.5 C)  TempSrc: Oral  Resp: 20  Height: 6' (1.829 m)  Weight: 266 lb 6.4 oz (120.838 kg)  SpO2: 95%   Body mass index is 36.12 kg/(m^2).  Physical Exam  Constitutional: He is oriented to  person, place, and time. He appears well-developed and well-nourished.  HENT:  Mouth/Throat: Oropharynx is clear and moist.  Eyes: Pupils are equal, round, and reactive to light. No scleral icterus.  Neck: Neck supple. Carotid bruit is not present. No thyromegaly present.  Cardiovascular: Normal rate, regular rhythm and intact distal pulses.  Exam reveals no gallop and no friction rub.   Murmur (1/6 SEM) heard. +1 pitting LE edema b/l. No calf TTP  Pulmonary/Chest: Effort normal and breath sounds normal. He has no wheezes. He has no rales. He exhibits no tenderness.  Abdominal: Soft. Bowel sounds are normal. He  exhibits no distension, no abdominal bruit, no pulsatile midline mass and no mass. There is no tenderness. There is no rebound and no guarding.  Musculoskeletal: He exhibits edema.  Lymphadenopathy:    He has no cervical adenopathy.  Neurological: He is alert and oriented to person, place, and time. He has normal reflexes.  Skin: Skin is warm and dry. No rash noted.  Psychiatric: He has a normal mood and affect. His behavior is normal. Judgment and thought content normal.     Labs reviewed: No visits with results within 3 Month(s) from this visit. Latest known visit with results is:  Office Visit on 08/20/2015  Component Date Value Ref Range Status  . Hgb A1c MFr Bld 08/20/2015 6.4* 4.8 - 5.6 % Final   Comment:          Pre-diabetes: 5.7 - 6.4          Diabetes: >6.4          Glycemic control for adults with diabetes: <7.0   . Est. average glucose Bld gHb Est-m* 08/20/2015 137   Final    No results found.   Assessment/Plan   ICD-9-CM ICD-10-CM   1. Diabetes mellitus due to underlying condition, controlled, with diabetic nephropathy, without long-term current use of insulin (HCC) 249.40 E08.21 Hemoglobin A1c   583.81  Microalbumin/Creatinine Ratio, Urine  2. Essential hypertension, benign 401.1 I10   3. Hyperlipidemia 272.4 E78.5 Lipid Panel  4. Bilateral edema of  lower extremity 782.3 R60.0   5. Pulmonary emphysema, unspecified emphysema type ([place]) 492.8 J43.9   6. Gastroesophageal reflux disease without esophagitis 530.81 K21.9   7. Malignant neoplasm of prostate ([place]) 185 C61     Repeat BP by myself 156/80. He took all AM meds already. Observation warranted for now  Continue current meds as ordered  May take Tums as needed for indigestion. May also take prilosec as needed.  Follow up with specialists as scheduled.  Follow up in 4 mos for routine visit. Will check fasting labs prior to appt  [facility] S. Perlie Gold  [facility] and Adult Medicine [address] [place], [place] [address] [phone] Cell (Monday-Friday 8 AM - 5 PM) [phone] After 5 PM and follow prompts

## 2016-01-02 ENCOUNTER — Other Ambulatory Visit: Payer: Self-pay | Admitting: Internal Medicine

## 2016-01-09 ENCOUNTER — Ambulatory Visit (HOSPITAL_COMMUNITY)
Admission: RE | Admit: 2016-01-09 | Discharge: 2016-01-09 | Disposition: A | Discharge status: Home / Self Care | Payer: Medicare Other | Source: Ambulatory Visit | Attending: Ophthalmology | Admitting: Ophthalmology

## 2016-01-09 ENCOUNTER — Ambulatory Visit (HOSPITAL_COMMUNITY): Payer: Medicare Other | Admitting: Anesthesiology

## 2016-01-09 ENCOUNTER — Encounter (HOSPITAL_COMMUNITY)
Admission: RE | Disposition: A | Discharge status: Home / Self Care | Payer: Self-pay | Source: Ambulatory Visit | Attending: Ophthalmology

## 2016-01-09 ENCOUNTER — Encounter (HOSPITAL_COMMUNITY): Payer: Self-pay | Admitting: Anesthesiology

## 2016-01-09 ENCOUNTER — Ambulatory Visit: Payer: Self-pay | Admitting: Ophthalmology

## 2016-01-09 DIAGNOSIS — H3562 Retinal hemorrhage, left eye: Secondary | ICD-10-CM | POA: Diagnosis not present

## 2016-01-09 DIAGNOSIS — I10 Essential (primary) hypertension: Secondary | ICD-10-CM | POA: Insufficient documentation

## 2016-01-09 DIAGNOSIS — E785 Hyperlipidemia, unspecified: Secondary | ICD-10-CM | POA: Diagnosis not present

## 2016-01-09 DIAGNOSIS — H3509 Other intraretinal microvascular abnormalities: Secondary | ICD-10-CM | POA: Diagnosis not present

## 2016-01-09 DIAGNOSIS — E119 Type 2 diabetes mellitus without complications: Secondary | ICD-10-CM | POA: Insufficient documentation

## 2016-01-09 DIAGNOSIS — H53142 Visual discomfort, left eye: Secondary | ICD-10-CM | POA: Diagnosis not present

## 2016-01-09 DIAGNOSIS — H4312 Vitreous hemorrhage, left eye: Secondary | ICD-10-CM | POA: Diagnosis present

## 2016-01-09 HISTORY — PX: PERFLUORONE INJECTION: SHX5302

## 2016-01-09 HISTORY — PX: LASER PHOTO ABLATION: SHX5942

## 2016-01-09 HISTORY — PX: PARS PLANA VITRECTOMY: SHX2166

## 2016-01-09 HISTORY — PX: AIR/FLUID EXCHANGE: SHX6494

## 2016-01-09 LAB — POCT I-STAT, CHEM 8
BUN: 22 mg/dL — AB (ref 6–20)
CALCIUM ION: 1.09 mmol/L — AB (ref 1.13–1.30)
CHLORIDE: 104 mmol/L (ref 101–111)
Creatinine, Ser: 1.2 mg/dL (ref 0.61–1.24)
Glucose, Bld: 106 mg/dL — ABNORMAL HIGH (ref 65–99)
HCT: 35 % — ABNORMAL LOW (ref 39.0–52.0)
Hemoglobin: 11.9 g/dL — ABNORMAL LOW (ref 13.0–17.0)
Potassium: 3.7 mmol/L (ref 3.5–5.1)
SODIUM: 141 mmol/L (ref 135–145)
TCO2: 23 mmol/L (ref 0–100)

## 2016-01-09 SURGERY — PARS PLANA VITRECTOMY WITH 25 GAUGE
Anesthesia: Monitor Anesthesia Care | Site: Eye | Laterality: Left

## 2016-01-09 MED ORDER — GATIFLOXACIN 0.5 % OP SOLN
OPHTHALMIC | Status: AC
  Administered 2016-01-09: 1 [drp] via OPHTHALMIC
  Filled 2016-01-09: qty 2.5

## 2016-01-09 MED ORDER — FENTANYL CITRATE (PF) 100 MCG/2ML IJ SOLN: 25.0000 ug | INTRAMUSCULAR | Status: DC | PRN

## 2016-01-09 MED ORDER — FENTANYL CITRATE (PF) 250 MCG/5ML IJ SOLN
INTRAMUSCULAR | Status: AC
  Filled 2016-01-09: qty 5

## 2016-01-09 MED ORDER — MIDAZOLAM HCL 5 MG/5ML IJ SOLN
INTRAMUSCULAR | Status: DC | PRN
  Administered 2016-01-09: 1 mg via INTRAVENOUS

## 2016-01-09 MED ORDER — HYPROMELLOSE (GONIOSCOPIC) 2.5 % OP SOLN
OPHTHALMIC | Status: DC | PRN
  Administered 2016-01-09: 2 [drp] via OPHTHALMIC

## 2016-01-09 MED ORDER — BUPIVACAINE HCL (PF) 0.75 % IJ SOLN
INTRAMUSCULAR | Status: AC
  Filled 2016-01-09: qty 10

## 2016-01-09 MED ORDER — PHENYLEPHRINE HCL 2.5 % OP SOLN
OPHTHALMIC | Status: AC
  Administered 2016-01-09: 1 [drp] via OPHTHALMIC
  Filled 2016-01-09: qty 2

## 2016-01-09 MED ORDER — ATROPINE SULFATE 1 % OP SOLN
OPHTHALMIC | Status: AC
  Filled 2016-01-09: qty 5

## 2016-01-09 MED ORDER — PROPOFOL 10 MG/ML IV BOLUS
INTRAVENOUS | Status: DC | PRN
  Administered 2016-01-09: 30 mg via INTRAVENOUS
  Administered 2016-01-09: 20 mg via INTRAVENOUS
  Administered 2016-01-09 (×2): 10 mg via INTRAVENOUS

## 2016-01-09 MED ORDER — CYCLOPENTOLATE HCL 1 % OP SOLN
1.0000 [drp] | OPHTHALMIC | Status: AC
  Administered 2016-01-09 (×3): 1 [drp] via OPHTHALMIC

## 2016-01-09 MED ORDER — MIDAZOLAM HCL 2 MG/2ML IJ SOLN
INTRAMUSCULAR | Status: AC
  Filled 2016-01-09: qty 2

## 2016-01-09 MED ORDER — STERILE WATER FOR IRRIGATION IR SOLN
Status: DC | PRN
  Administered 2016-01-09: 200 mL

## 2016-01-09 MED ORDER — GATIFLOXACIN 0.5 % OP SOLN
1.0000 [drp] | OPHTHALMIC | Status: AC
  Administered 2016-01-09 (×3): 1 [drp] via OPHTHALMIC

## 2016-01-09 MED ORDER — CYCLOPENTOLATE HCL 1 % OP SOLN
OPHTHALMIC | Status: AC
  Administered 2016-01-09: 1 [drp] via OPHTHALMIC
  Filled 2016-01-09: qty 2

## 2016-01-09 MED ORDER — EPINEPHRINE HCL 1 MG/ML IJ SOLN
INTRAOCULAR | Status: DC | PRN
  Administered 2016-01-09: 500 mL

## 2016-01-09 MED ORDER — PREDNISOLONE ACETATE 1 % OP SUSP
OPHTHALMIC | Status: AC
  Filled 2016-01-09: qty 5

## 2016-01-09 MED ORDER — TOBRAMYCIN-DEXAMETHASONE 0.3-0.1 % OP OINT
TOPICAL_OINTMENT | OPHTHALMIC | Status: DC | PRN
  Administered 2016-01-09: 1 via OPHTHALMIC

## 2016-01-09 MED ORDER — HYPROMELLOSE (GONIOSCOPIC) 2.5 % OP SOLN
OPHTHALMIC | Status: AC
  Filled 2016-01-09: qty 15

## 2016-01-09 MED ORDER — BSS IO SOLN
INTRAOCULAR | Status: AC
  Filled 2016-01-09: qty 15

## 2016-01-09 MED ORDER — LIDOCAINE HCL 2 % IJ SOLN
INTRAMUSCULAR | Status: DC | PRN
  Administered 2016-01-09: 5 mL via RETROBULBAR

## 2016-01-09 MED ORDER — DEXAMETHASONE SODIUM PHOSPHATE 10 MG/ML IJ SOLN
INTRAMUSCULAR | Status: AC
  Filled 2016-01-09: qty 1

## 2016-01-09 MED ORDER — TETRACAINE HCL 0.5 % OP SOLN
OPHTHALMIC | Status: AC
  Filled 2016-01-09: qty 2

## 2016-01-09 MED ORDER — LIDOCAINE HCL (CARDIAC) 20 MG/ML IV SOLN
INTRAVENOUS | Status: DC | PRN
  Administered 2016-01-09: 100 mg via INTRATRACHEAL

## 2016-01-09 MED ORDER — 0.9 % SODIUM CHLORIDE (POUR BTL) OPTIME
TOPICAL | Status: DC | PRN
  Administered 2016-01-09: 200 mL

## 2016-01-09 MED ORDER — BSS PLUS IO SOLN
INTRAOCULAR | Status: AC
  Filled 2016-01-09: qty 500

## 2016-01-09 MED ORDER — LIDOCAINE HCL 2 % IJ SOLN
INTRAMUSCULAR | Status: AC
  Filled 2016-01-09: qty 20

## 2016-01-09 MED ORDER — PREDNISOLONE ACETATE 1 % OP SUSP: 1.0000 [drp] | OPHTHALMIC | Status: DC

## 2016-01-09 MED ORDER — TOBRAMYCIN-DEXAMETHASONE 0.3-0.1 % OP OINT
TOPICAL_OINTMENT | OPHTHALMIC | Status: AC
  Filled 2016-01-09: qty 3.5

## 2016-01-09 MED ORDER — HYALURONIDASE HUMAN 150 UNIT/ML IJ SOLN
INTRAMUSCULAR | Status: AC
  Filled 2016-01-09: qty 1

## 2016-01-09 MED ORDER — PHENYLEPHRINE HCL 2.5 % OP SOLN
1.0000 [drp] | OPHTHALMIC | Status: AC
  Administered 2016-01-09 (×3): 1 [drp] via OPHTHALMIC

## 2016-01-09 MED ORDER — SODIUM CHLORIDE 0.9 % IV SOLN
INTRAVENOUS | Status: DC | PRN
  Administered 2016-01-09 (×2): via INTRAVENOUS

## 2016-01-09 MED ORDER — EPINEPHRINE HCL 1 MG/ML IJ SOLN
INTRAMUSCULAR | Status: AC
  Filled 2016-01-09: qty 1

## 2016-01-09 MED ORDER — DEXAMETHASONE SODIUM PHOSPHATE 10 MG/ML IJ SOLN
INTRAMUSCULAR | Status: DC | PRN
  Administered 2016-01-09: 10 mg

## 2016-01-09 SURGICAL SUPPLY — 65 items
APPLICATOR COTTON TIP 6IN STRL (MISCELLANEOUS) ×3 IMPLANT
BLADE MVR KNIFE 20G (BLADE) IMPLANT
CANNULA ANT CHAM MAIN (OPHTHALMIC RELATED) IMPLANT
CANNULA DUAL BORE 23G (CANNULA) IMPLANT
CANNULA VLV SOFT TIP 25G (OPHTHALMIC) ×1 IMPLANT
CANNULA VLV SOFT TIP 25GA (OPHTHALMIC) ×3 IMPLANT
CAUTERY EYE LOW TEMP 1300F FIN (OPHTHALMIC RELATED) IMPLANT
CLOSURE STERI-STRIP 1/2X4 (GAUZE/BANDAGES/DRESSINGS) ×1
CLSR STERI-STRIP ANTIMIC 1/2X4 (GAUZE/BANDAGES/DRESSINGS) ×2 IMPLANT
CORDS BIPOLAR (ELECTRODE) IMPLANT
COVER MAYO STAND STRL (DRAPES) ×3 IMPLANT
DRAPE INCISE 51X51 W/FILM STRL (DRAPES) ×3 IMPLANT
DRAPE PROXIMA HALF (DRAPES) ×3 IMPLANT
DRAPE RETRACTOR (MISCELLANEOUS) ×3 IMPLANT
ERASER HMR WETFIELD 23G BP (MISCELLANEOUS) IMPLANT
FORCEPS ECKARDT ILM 25G SERR (OPHTHALMIC RELATED) IMPLANT
FORCEPS GRIESHABER ILM 25G A (INSTRUMENTS) IMPLANT
GAS AUTO FILL CONSTEL (OPHTHALMIC) ×3
GAS AUTO FILL CONSTELLATION (OPHTHALMIC) IMPLANT
GLOVE BIOGEL PI IND STRL 6.5 (GLOVE) IMPLANT
GLOVE BIOGEL PI IND STRL 7.0 (GLOVE) IMPLANT
GLOVE BIOGEL PI IND STRL 7.5 (GLOVE) ×1 IMPLANT
GLOVE BIOGEL PI INDICATOR 6.5 (GLOVE) ×4
GLOVE BIOGEL PI INDICATOR 7.0 (GLOVE) ×2
GLOVE BIOGEL PI INDICATOR 7.5 (GLOVE) ×2
GLOVE SURG SS PI 6.5 STRL IVOR (GLOVE) ×2 IMPLANT
GOWN STRL REUS W/ TWL LRG LVL3 (GOWN DISPOSABLE) ×1 IMPLANT
GOWN STRL REUS W/TWL LRG LVL3 (GOWN DISPOSABLE) ×3
HANDLE PNEUMATIC FOR CONSTEL (OPHTHALMIC) IMPLANT
KIT BASIN OR (CUSTOM PROCEDURE TRAY) ×3 IMPLANT
KIT PERFLUORON PROCEDURE 5ML (MISCELLANEOUS) ×2 IMPLANT
KIT ROOM TURNOVER OR (KITS) ×3 IMPLANT
LENS BIOM SUPER VIEW SET DISP (OPHTHALMIC RELATED) ×3 IMPLANT
MICROPICK 25G (MISCELLANEOUS)
NDL 18GX1X1/2 (RX/OR ONLY) (NEEDLE) ×1 IMPLANT
NDL 25GX 5/8IN NON SAFETY (NEEDLE) ×1 IMPLANT
NDL FILTER BLUNT 18X1 1/2 (NEEDLE) ×1 IMPLANT
NDL HYPO 25GX1X1/2 BEV (NEEDLE) IMPLANT
NDL HYPO 30X.5 LL (NEEDLE) ×2 IMPLANT
NDL RETROBULBAR 25GX1.5 (NEEDLE) ×1 IMPLANT
NEEDLE 18GX1X1/2 (RX/OR ONLY) (NEEDLE) ×3 IMPLANT
NEEDLE 25GX 5/8IN NON SAFETY (NEEDLE) ×3 IMPLANT
NEEDLE FILTER BLUNT 18X 1/2SAF (NEEDLE) ×2
NEEDLE FILTER BLUNT 18X1 1/2 (NEEDLE) ×1 IMPLANT
NEEDLE HYPO 25GX1X1/2 BEV (NEEDLE) IMPLANT
NEEDLE HYPO 30X.5 LL (NEEDLE) ×6 IMPLANT
NEEDLE RETROBULBAR 25GX1.5 (NEEDLE) ×3 IMPLANT
NS IRRIG 1000ML POUR BTL (IV SOLUTION) ×3 IMPLANT
PACK VITRECTOMY CUSTOM (CUSTOM PROCEDURE TRAY) ×3 IMPLANT
PAD ARMBOARD 7.5X6 YLW CONV (MISCELLANEOUS) ×6 IMPLANT
PAK PIK VITRECTOMY CVS 25GA (OPHTHALMIC) ×3 IMPLANT
PENCIL BIPOLAR 25GA STR DISP (OPHTHALMIC RELATED) IMPLANT
PICK MICROPICK 25G (MISCELLANEOUS) IMPLANT
PROBE LASER ILLUM FLEX CVD 25G (OPHTHALMIC) IMPLANT
ROLLS DENTAL (MISCELLANEOUS) IMPLANT
SCRAPER DIAMOND 25GA (OPHTHALMIC RELATED) IMPLANT
STOPCOCK 4 WAY LG BORE MALE ST (IV SETS) IMPLANT
SUT VICRYL 7 0 TG140 8 (SUTURE) ×3 IMPLANT
SYR 20CC LL (SYRINGE) ×3 IMPLANT
SYR 5ML LL (SYRINGE) ×3 IMPLANT
SYR TB 1ML LUER SLIP (SYRINGE) IMPLANT
SYRINGE 10CC LL (SYRINGE) IMPLANT
TOWEL OR 17X24 6PK STRL BLUE (TOWEL DISPOSABLE) ×6 IMPLANT
WATER STERILE IRR 1000ML POUR (IV SOLUTION) ×3 IMPLANT
WIPE INSTRUMENT VISIWIPE 73X73 (MISCELLANEOUS) IMPLANT

## 2016-01-09 NOTE — Discharge Instructions (Signed)
Sleep with nose pointed to pillow.  Keep head face down while awake until gas bubble is 50%.

## 2016-01-09 NOTE — Anesthesia Preprocedure Evaluation (Addendum)
Anesthesia Evaluation  Patient identified by MRN, date of birth, ID band Patient awake    Reviewed: Allergy & Precautions, H&P , NPO status , Patient's Chart, lab work & pertinent test results, reviewed documented beta blocker date and time   Airway        Dental no notable dental hx.    Pulmonary COPD, former smoker,    Pulmonary exam normal        Cardiovascular hypertension, Pt. on medications and Pt. on home beta blockers      Neuro/Psych negative neurological ROS  negative psych ROS   GI/Hepatic negative GI ROS, Neg liver ROS,   Endo/Other  diabetes, Type 2, Oral Hypoglycemic AgentsMorbid obesity  Renal/GU Renal disease  negative genitourinary   Musculoskeletal  (+) Arthritis , Osteoarthritis,    Abdominal   Peds  Hematology negative hematology ROS (+)   Anesthesia Other Findings   Reproductive/Obstetrics negative OB ROS                            Anesthesia Physical Anesthesia Plan  ASA: III  Anesthesia Plan: MAC   Post-op Pain Management:    Induction: Intravenous  Airway Management Planned: Nasal Cannula  Additional Equipment:   Intra-op Plan:   Post-operative Plan:   Informed Consent: I have reviewed the patients History and Physical, chart, labs and discussed the procedure including the risks, benefits and alternatives for the proposed anesthesia with the patient or authorized representative who has indicated his/her understanding and acceptance.   Dental advisory given  Plan Discussed with: CRNA  Anesthesia Plan Comments:        Anesthesia Quick Evaluation

## 2016-01-09 NOTE — Brief Op Note (Signed)
01/09/2016  6:44 PM  PATIENT:  Jeremiah Collins  82 y.o. male  PRE-OPERATIVE DIAGNOSIS:  vitreous hemorrhage left eye  POST-OPERATIVE DIAGNOSIS:  vitreous hemorrhage left eye with subretinal hemorrhage  PROCEDURE:  Procedure(s) with comments: PARS PLANA VITRECTOMY WITH 25 GAUGE LEFT EYE  (Left) AIR/FLUID EXCHANGE (Left) LASER PHOTO ABLATION (Left) - Endolaser PERFLUORONE INJECTION (Left)  Placement of SF6 gas tamponade  SURGEON:  Surgeon(s) and Role:    * Jalene Mullet, MD - Primary  PHYSICIAN ASSISTANT:   ASSISTANTS: none   ANESTHESIA:   local and MAC  EBL:  Total I/O In: 500 [I.V.:500] Out: -   BLOOD ADMINISTERED:none  DRAINS: none   LOCAL MEDICATIONS USED:  MARCAINE    and LIDOCAINE   SPECIMEN:  No Specimen  DISPOSITION OF SPECIMEN:   NA  COUNTS:  YES  TOURNIQUET:  * No tourniquets in log *  DICTATION: .Note written in EPIC  PLAN OF CARE: Discharge to home after PACU  PATIENT DISPOSITION:  PACU - hemodynamically stable.   Delay start of Pharmacological VTE agent (>24hrs) due to surgical blood loss or risk of bleeding: not applicable

## 2016-01-09 NOTE — H&P (Signed)
Date of examination:  01/09/2016   Indication for surgery: Vitreous hemorrhage left eye with suspected retinal tear  Pertinent past medical history:  Past Medical History  Diagnosis Date  . HTN (hypertension)   . Other and unspecified hyperlipidemia 01/02/2013  . Routine general medical examination at a health care facility   . Acute bronchitis 09/12/2012  . Cellulitis and abscess of leg, except foot 01/17/2012  . Gross hematuria 09/06/2011  . Osteoarthrosis involving, or with mention of more than one site, but not specified as generalized, multiple sites 10/22/2010  . Reflux esophagitis 05/10/2008  . First degree atrioventricular block 04/25/2007  . 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 Palpitations 04/28/2004  . Chest pain, unspecified 04/28/2004  . Closed fracture of five ribs 03/24/2004  . Spinal stenosis, unspecified region other than cervical 04/29/1995  . Other abnormal blood chemistry 04/29/1991  . Cataract     Dr.Groat  . Type II or unspecified type diabetes mellitus without mention of complication, uncontrolled   . Prostate cancer ([place]) 12/16/1994    gleason 7  . Hx of radiation therapy 11/1998    prostate fossa - 6040 cGy, 33 fx, Dr Danny Lawless  . Bone metastases ([place])     left mid tibial shaft  . History of radiation therapy 04/03/14- 04/17/14    mid to distal left tibia 3000 cGy in 10 sessions    Pertinent ocular history:  3 day history of decreased vision left eye   Pertinent family history:  Family History  Problem Relation Age of Onset  . Heart disease Father   . Stroke Sister     General:  Healthy appearing patient in no distress.     Eyes:    Acuity OD 20/25  OS 20/800  [place]  External: Within normal limits      Anterior segment: Within normal limits      Motility:      Fundus: normal right eye, Vitreous hemorrhage left eye, macula not visible, difficult view to periphery   Heart: Regular rate and rhythm without murmur      Lungs: Clear to auscultation      Abdomen: Soft, nontender, normal bowel sounds     Impression:  Vitreous hemorrhage left eye with suspected retinal tear  Plan: Pars plana vitrectomy, endolaser, air/fluid exchange, and possible placement of gas left eye  Royston Cowper

## 2016-01-09 NOTE — Op Note (Signed)
Jeremiah Collins 01/09/2016 Diagnosis: Vitreous hemorrhage left eye with subretinal hemorrhage adjacent to optic disc  Procedure: Pars Plana Vitrectomy, Endolaser, Fluid Gas Exchange and perflouron with placement of SF6 gas (16%) Operative Eye:  left eye  Surgeon: Royston Cowper Estimated Blood Loss: minimal Specimens for Pathology:  None Complications: none   The  patient was prepped and draped in the usual fashion for ocular surgery on the  left eye .  A lid speculum was placed.  Infusion line and trocar was placed at the 4 o'clock position approximately 3.5 mm from the surgical limbus.   The infusion line was allowed to run and then clamped when placed at the cannula opening. The line was inserted and secured to the drape with an adhesive strip.   Active trocars/cannula were placed at the 10 and 2 o'clock positions approximately 3.5 mm from the surgical limbus. The cannula was visualized in the vitreous cavity.  The light pipe and vitreous cutter were inserted into the vitreous cavity and a core vitrectomy was performed.  Care taken to remove the vitreous up to the vitreous base for 360 degrees.   There was an area of subretinal hemorrhage adjacent and nasal to the optic disc.  Attempt was made to drain the subretinal hemorrhage with perflouron and the soft tipped extrusion canula.  The perflouron was removed and a complete air fluid exchange was performed.  3 rows of endolaser were applied 360 degrees to the periphery.  16% SF6 gas was placed in the eye.  The superior cannulas were sequentially removed with concommitant tamponade using a cotton tipped applicator and noted to be air tight.  The infusion line and trocar were removed and the sclerotomy was noted to be air tight with normal intraocular pressure by digital palpapation.  Subconjunctival injections of  Dexamethasone 4mg /7ml was placed in the infero-medial quadrant.    The speculum and drapes were removed and the eye was  patched withTobradex ophthalmic ointment. An eye shield was placed and the patient was transferred alert and conversant with stable vital signs to the post operative recovery area.  The patient tolerated the procedure well and no complications were noted.  Royston Cowper MD

## 2016-01-09 NOTE — Transfer of Care (Signed)
Immediate Anesthesia Transfer of Care Note  Patient: [name]  Procedure(s) Performed: Procedure(s) with comments: PARS PLANA VITRECTOMY WITH 25 GAUGE LEFT EYE  (Left) AIR/FLUID EXCHANGE (Left) LASER PHOTO ABLATION (Left) - Endolaser PERFLUORONE INJECTION (Left)  Patient Location: PACU  Anesthesia Type:MAC  Level of Consciousness: awake, alert , oriented, patient cooperative and responds to stimulation  Airway & Oxygen Therapy: Patient Spontanous Breathing  Post-op Assessment: Report given to RN, Post -op Vital signs reviewed and stable, Patient moving all extremities and Patient moving all extremities X 4  Post vital signs: Reviewed and stable  Last Vitals:  Filed Vitals:   01/09/16 1618  BP: 167/70  Pulse: 72  Temp: 37.1 C  Resp: 18    Complications: No apparent anesthesia complications

## 2016-01-09 NOTE — Anesthesia Procedure Notes (Signed)
Procedure Name: MAC Date/Time: 01/09/2016 5:20 PM Performed by: Jacquiline Doe A Pre-anesthesia Checklist: Patient identified, Emergency Drugs available, Suction available and Patient being monitored Oxygen Delivery Method: Nasal cannula Intubation Type: IV induction Placement Confirmation: positive ETCO2 Dental Injury: Teeth and Oropharynx as per pre-operative assessment

## 2016-01-10 LAB — GLUCOSE, CAPILLARY: Glucose-Capillary: 89 mg/dL (ref 65–99)

## 2016-01-10 NOTE — Anesthesia Postprocedure Evaluation (Signed)
Anesthesia Post Note  Patient: Jeremiah Collins  Procedure(s) Performed: Procedure(s) (LRB): PARS PLANA VITRECTOMY WITH 25 GAUGE LEFT EYE  (Left) AIR/FLUID EXCHANGE (Left) LASER PHOTO ABLATION (Left) PERFLUORONE INJECTION (Left)  Patient location during evaluation: PACU Anesthesia Type: General Level of consciousness: awake Pain management: pain level controlled Vital Signs Assessment: post-procedure vital signs reviewed and stable Respiratory status: spontaneous breathing Cardiovascular status: stable Anesthetic complications: no    Last Vitals:  Filed Vitals:   01/09/16 2000 01/09/16 2015  BP:  144/74  Pulse: 85 81  Temp:    Resp: 22 23    Last Pain: There were no vitals filed for this visit.               EDWARDS,Deone Leifheit

## 2016-01-12 ENCOUNTER — Encounter (HOSPITAL_COMMUNITY): Payer: Self-pay | Admitting: Ophthalmology

## 2016-01-23 ENCOUNTER — Other Ambulatory Visit: Payer: Self-pay | Admitting: Internal Medicine

## 2016-01-23 DIAGNOSIS — H53132 Sudden visual loss, left eye: Secondary | ICD-10-CM | POA: Diagnosis not present

## 2016-01-23 DIAGNOSIS — H53142 Visual discomfort, left eye: Secondary | ICD-10-CM | POA: Diagnosis not present

## 2016-01-23 DIAGNOSIS — H3509 Other intraretinal microvascular abnormalities: Secondary | ICD-10-CM | POA: Diagnosis not present

## 2016-02-09 DIAGNOSIS — C61 Malignant neoplasm of prostate: Secondary | ICD-10-CM | POA: Diagnosis not present

## 2016-02-09 DIAGNOSIS — N1339 Other hydronephrosis: Secondary | ICD-10-CM | POA: Diagnosis not present

## 2016-02-10 ENCOUNTER — Encounter: Payer: Self-pay | Admitting: Internal Medicine

## 2016-02-10 ENCOUNTER — Encounter (INDEPENDENT_AMBULATORY_CARE_PROVIDER_SITE_OTHER): Payer: Medicare Other | Admitting: Ophthalmology

## 2016-02-10 ENCOUNTER — Other Ambulatory Visit: Payer: Self-pay | Admitting: Internal Medicine

## 2016-02-10 DIAGNOSIS — H43813 Vitreous degeneration, bilateral: Secondary | ICD-10-CM

## 2016-02-10 DIAGNOSIS — I420 Dilated cardiomyopathy: Secondary | ICD-10-CM | POA: Insufficient documentation

## 2016-02-10 DIAGNOSIS — H34219 Partial retinal artery occlusion, unspecified eye: Secondary | ICD-10-CM | POA: Insufficient documentation

## 2016-02-10 DIAGNOSIS — H4312 Vitreous hemorrhage, left eye: Secondary | ICD-10-CM | POA: Diagnosis not present

## 2016-02-10 DIAGNOSIS — H35031 Hypertensive retinopathy, right eye: Secondary | ICD-10-CM | POA: Diagnosis not present

## 2016-02-10 DIAGNOSIS — I10 Essential (primary) hypertension: Secondary | ICD-10-CM

## 2016-02-10 DIAGNOSIS — H34211 Partial retinal artery occlusion, right eye: Secondary | ICD-10-CM | POA: Diagnosis not present

## 2016-02-11 ENCOUNTER — Ambulatory Visit (HOSPITAL_COMMUNITY)
Admission: RE | Admit: 2016-02-11 | Discharge: 2016-02-11 | Disposition: A | Discharge status: Home / Self Care | Payer: Medicare Other | Source: Ambulatory Visit | Attending: Internal Medicine | Admitting: Internal Medicine

## 2016-02-11 DIAGNOSIS — I429 Cardiomyopathy, unspecified: Secondary | ICD-10-CM | POA: Diagnosis present

## 2016-02-11 DIAGNOSIS — I350 Nonrheumatic aortic (valve) stenosis: Secondary | ICD-10-CM | POA: Diagnosis not present

## 2016-02-11 DIAGNOSIS — I420 Dilated cardiomyopathy: Secondary | ICD-10-CM

## 2016-02-11 DIAGNOSIS — I119 Hypertensive heart disease without heart failure: Secondary | ICD-10-CM | POA: Insufficient documentation

## 2016-02-11 NOTE — Progress Notes (Signed)
  Echocardiogram 2D Echocardiogram has been performed.  Jeremiah Collins M 02/11/2016, 3:53 PM

## 2016-02-13 ENCOUNTER — Other Ambulatory Visit: Payer: Self-pay

## 2016-02-13 DIAGNOSIS — R931 Abnormal findings on diagnostic imaging of heart and coronary circulation: Secondary | ICD-10-CM

## 2016-02-16 ENCOUNTER — Ambulatory Visit
Admission: RE | Admit: 2016-02-16 | Discharge: 2016-02-16 | Disposition: A | Discharge status: Home / Self Care | Payer: Medicare Other | Source: Ambulatory Visit | Attending: Internal Medicine | Admitting: Internal Medicine

## 2016-02-16 DIAGNOSIS — H34219 Partial retinal artery occlusion, unspecified eye: Secondary | ICD-10-CM

## 2016-02-16 DIAGNOSIS — I6523 Occlusion and stenosis of bilateral carotid arteries: Secondary | ICD-10-CM | POA: Diagnosis not present

## 2016-02-20 ENCOUNTER — Encounter (INDEPENDENT_AMBULATORY_CARE_PROVIDER_SITE_OTHER): Payer: Medicare Other | Admitting: Ophthalmology

## 2016-02-20 ENCOUNTER — Ambulatory Visit (INDEPENDENT_AMBULATORY_CARE_PROVIDER_SITE_OTHER): Payer: Medicare Other | Admitting: Internal Medicine

## 2016-02-20 ENCOUNTER — Encounter: Payer: Self-pay | Admitting: Internal Medicine

## 2016-02-20 VITALS — BP 144/82 | HR 72 | Temp 98.1°F | Ht 72.0 in | Wt 269.8 lb

## 2016-02-20 DIAGNOSIS — H34211 Partial retinal artery occlusion, right eye: Secondary | ICD-10-CM

## 2016-02-20 DIAGNOSIS — I517 Cardiomegaly: Secondary | ICD-10-CM

## 2016-02-20 DIAGNOSIS — I059 Rheumatic mitral valve disease, unspecified: Secondary | ICD-10-CM | POA: Diagnosis not present

## 2016-02-20 DIAGNOSIS — I058 Other rheumatic mitral valve diseases: Secondary | ICD-10-CM

## 2016-02-20 DIAGNOSIS — E785 Hyperlipidemia, unspecified: Secondary | ICD-10-CM | POA: Diagnosis not present

## 2016-02-20 DIAGNOSIS — R600 Localized edema: Secondary | ICD-10-CM | POA: Diagnosis not present

## 2016-02-20 DIAGNOSIS — J439 Emphysema, unspecified: Secondary | ICD-10-CM

## 2016-02-20 DIAGNOSIS — I10 Essential (primary) hypertension: Secondary | ICD-10-CM | POA: Diagnosis not present

## 2016-02-20 DIAGNOSIS — C61 Malignant neoplasm of prostate: Secondary | ICD-10-CM | POA: Diagnosis not present

## 2016-02-20 MED ORDER — METOPROLOL TARTRATE 50 MG PO TABS: 50.0000 mg | ORAL_TABLET | Freq: Every day | ORAL | Status: DC

## 2016-02-20 NOTE — Patient Instructions (Addendum)
Change metoprolol to 50mg  daily (may finish current bottle by taking 2 daily until bottle empty then get new dose from pharmacy)  Check blood pressure at home daily. Call office if <100/50 or >180/100.   Check pulse (heart rate) at home and call office if <50 or >100.  Continue other medications as ordered  Follow up with cardiology (Dr Marlou Porch) as scheduled in May  Keep appt with urology as scheduled  Follow up here as scheduled

## 2016-02-20 NOTE — Progress Notes (Signed)
Patient ID: Jeremiah Collins, male   DOB: October 10, 1934, 82 y.o.   MRN: [id number]    Location:    [facility]   Place of Service:   OFFICE  Chief Complaint  Patient presents with  . Medical Management of Chronic Issues    Medical Management of Chronic Issues. Discuss Echo Results.     HPI:  82 yo male seen today for 2D echo result discussion. OD Hollenhorst plaque seen on eye exam by Dr Zigmund Daniel. He went for 2D echo and carotid study. 2 D echo revealed mild AS and calcified mass on posterior leaflet of mitral valve with severe LVH. nml heart fxn with nml EF. He has SOB. No CP or dizziness. No leg swelling. He has hx prostate CA and rec'd XRT. He is currently on leuprolide injection q87mos  DM - BS 120s usually. No low BS reactions. No numbness or tingling. He takes metformin. A1c 6.4%  HTN - BP stable on lisinopril-HCT and metoprolol. Takes ASA daily  Hyperlipidemia - stable on statin. Occasional muscle weakness.  Past Medical History  Diagnosis Date  . HTN (hypertension)   . Other and unspecified hyperlipidemia 01/02/2013  . Routine general medical examination at a health care facility   . Acute bronchitis 09/12/2012  . Cellulitis and abscess of leg, except foot 01/17/2012  . Gross hematuria 09/06/2011  . Osteoarthrosis involving, or with mention of more than one site, but not specified as generalized, multiple sites 10/22/2010  . Reflux esophagitis 05/10/2008  . First degree atrioventricular block 04/25/2007  . 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 Palpitations 04/28/2004  . Chest pain, unspecified 04/28/2004  . Closed fracture of five ribs 03/24/2004  . Spinal stenosis, unspecified region other than cervical 04/29/1995  . Other abnormal blood chemistry 04/29/1991  . Cataract     Dr.Groat  . Type II or unspecified type diabetes mellitus without mention of complication, uncontrolled   . Prostate cancer ([place]) 12/16/1994    gleason 7  . Hx of radiation therapy 11/1998    prostate fossa - 6040  cGy, 33 fx, Dr Danny Lawless  . Bone metastases ([place])     left mid tibial shaft  . History of radiation therapy 04/03/14- 04/17/14    mid to distal left tibia 3000 cGy in 10 sessions    Past Surgical History  Procedure Laterality Date  . Left ureteral stent placement  Week of 12/05/11  . Prostatectomy  1996    Dr. Rosana Hoes  . Eye surgery  2006    cataract, Dr Shanon Rosser  . Cystoscopy w/ ureteral stent placement  12/31/14  . Cystoscopy w/ ureteral stent placement  05/16/2015  . Pars plana vitrectomy Left 01/09/2016    Procedure: PARS PLANA VITRECTOMY WITH 25 GAUGE LEFT EYE ;  Surgeon: Jalene Mullet, MD;  Location: [facility];  Service: Ophthalmology;  Laterality: Left;  . Air/fluid exchange Left 01/09/2016    Procedure: AIR/FLUID EXCHANGE;  Surgeon: Jalene Mullet, MD;  Location: [facility];  Service: Ophthalmology;  Laterality: Left;  . Laser photo ablation Left 01/09/2016    Procedure: LASER PHOTO ABLATION;  Surgeon: Jalene Mullet, MD;  Location: [facility];  Service: Ophthalmology;  Laterality: Left;  Endolaser  . Perfluorone injection Left 01/09/2016    Procedure: PERFLUORONE INJECTION;  Surgeon: Jalene Mullet, MD;  Location: [facility];  Service: Ophthalmology;  Laterality: Left;    Patient Care Team: [name], DO as PCP - General (Internal Medicine) Myrlene Broker, MD as Attending Physician (Urology) [name], MD as Consulting Physician (  Ophthalmology) [name], MD as Consulting Physician (Ophthalmology)  Social History   Social History  . Marital Status: Married    Spouse Name: N/A  . Number of Children: N/A  . Years of Education: N/A   Occupational History  . Not on file.   Social History Main Topics  . Smoking status: Former Smoker    Types: Cigars    Quit date: 12/16/1968  . Smokeless tobacco: Never Used  . Alcohol Use: No  . Drug Use: No  . Sexual Activity: Not on file   Other Topics Concern  . Not on file   Social History Narrative     reports that he quit smoking about  47 years ago. His smoking use included Cigars. He has never used smokeless tobacco. He reports that he does not drink alcohol or use illicit drugs.  Allergies  Allergen Reactions  . Abiraterone Other (See Comments) and Nausea And Vomiting    Other reaction(s): Increased Heart Rate (intolerance)    Medications: Patient's Medications  New Prescriptions   No medications on file  Previous Medications   ASPIRIN 81 MG TABLET    Take 1 tablet (81 mg total) by mouth daily.   CHOLECALCIFEROL 1000 UNITS TABLET    Take 1,000 Units by mouth daily.   DOXAZOSIN (CARDURA) 4 MG TABLET    TAKE ONE TABLET BY MOUTH ONCE DAILY   GLUCOSE BLOOD (ONETOUCH VERIO) TEST STRIP    Check blood sugar 1-2 times weekly.  DX: 250.00   LEUPROLIDE, 6 MONTH, (LEUPROLIDE ACETATE, 6 MONTH,) 45 MG INJECTION    Inject 45 mg into the skin every 6 (six) months. Filled by [facility] (PRINZIDE,ZESTORETIC) 20-12.5 MG TABLET    TAKE ONE TABLET BY MOUTH DAILY   METFORMIN (GLUCOPHAGE) 500 MG TABLET    TAKE ONE TABLET BY MOUTH ONCE DAILY WITH  BREAKFAST   METOPROLOL TARTRATE (LOPRESSOR) 25 MG TABLET    Take 1 tablet (25 mg total) by mouth daily.   OMEGA-3 FATTY ACIDS (FISH OIL) 1000 MG CAPS    Take 1 capsule (1,000 mg total) by mouth daily.   ONETOUCH DELICA LANCETS 99991111 MISC    Check blood sugar 1 time weekly.   DX: 250.00   ONETOUCH VERIO TEST STRIP    CHECK BLOOD SUGAR 1-2 TIMES WEEKLY   SIMVASTATIN (ZOCOR) 5 MG TABLET    TAKE ONE TABLET BY MOUTH DAILY  Modified Medications   No medications on file  Discontinued Medications   METOPROLOL TARTRATE (LOPRESSOR) 25 MG TABLET    TAKE ONE TABLET BY MOUTH TWICE DAILY    Review of Systems  Respiratory: Positive for shortness of breath.   Musculoskeletal: Positive for arthralgias.  Skin: Negative for wound.  All other systems reviewed and are negative.   Filed Vitals:   02/20/16 1300  BP: 144/82  Pulse: 72  Temp: 98.1 F (36.7 C)  TempSrc:  Oral  Height: 6' (1.829 m)  Weight: 269 lb 12.8 oz (122.38 kg)   Body mass index is 36.58 kg/(m^2).  Physical Exam  Constitutional: He is oriented to person, place, and time. He appears well-developed and well-nourished. No distress.  Eyes: Pupils are equal, round, and reactive to light. No scleral icterus.  Neck: Neck supple.  Cardiovascular: Normal rate, regular rhythm and intact distal pulses.  Exam reveals no gallop, no distant heart sounds and no friction rub.   Murmur heard.  Systolic murmur is present with a grade of 2/6  +1 pitting LE  edema b/l. No calf TTP  Pulmonary/Chest: Effort normal and breath sounds normal. No respiratory distress. He has no wheezes. He has no rales. He exhibits no tenderness.  Musculoskeletal: He exhibits edema.  Neurological: He is alert and oriented to person, place, and time.  Skin: Skin is warm and dry. No rash noted.  Psychiatric: He has a normal mood and affect. His behavior is normal. Thought content normal.     Labs reviewed: Admission on 01/09/2016, Discharged on 01/09/2016  Component Date Value Ref Range Status  . Sodium 01/09/2016 141  135 - 145 mmol/L Final  . Potassium 01/09/2016 3.7  3.5 - 5.1 mmol/L Final  . Chloride 01/09/2016 104  101 - 111 mmol/L Final  . BUN 01/09/2016 22* 6 - 20 mg/dL Final  . Creatinine, Ser 01/09/2016 1.20  0.61 - 1.24 mg/dL Final  . Glucose, Bld 01/09/2016 106* 65 - 99 mg/dL Final  . Calcium, Ion 01/09/2016 1.09* 1.13 - 1.30 mmol/L Final  . TCO2 01/09/2016 23  0 - 100 mmol/L Final  . Hemoglobin 01/09/2016 11.9* 13.0 - 17.0 g/dL Final  . HCT 01/09/2016 35.0* 39.0 - 52.0 % Final  . Glucose-Capillary 01/09/2016 89  65 - 99 mg/dL Final    US Carotid Duplex Bilateral  02/16/2016  CLINICAL DATA:  Hollenhorst plaque. EXAM: BILATERAL CAROTID DUPLEX ULTRASOUND TECHNIQUE: Pearline Cables scale imaging, color Doppler and duplex ultrasound were performed of bilateral carotid and vertebral arteries in the neck. COMPARISON:  None.  FINDINGS: Criteria: Quantification of carotid stenosis is based on velocity parameters that correlate the residual internal carotid diameter with NASCET-based stenosis levels, using the diameter of the distal internal carotid lumen as the denominator for stenosis measurement. The following velocity measurements were obtained: RIGHT ICA:  108 cm/sec CCA:  65 cm/sec SYSTOLIC ICA/CCA RATIO:  1.9 DIASTOLIC ICA/CCA RATIO:  2.9 ECA:  124 cm/sec LEFT ICA:  74 cm/sec CCA:  [id number] cm/sec SYSTOLIC ICA/CCA RATIO:  0.7 DIASTOLIC ICA/CCA RATIO:  0.9 ECA:  81 cm/sec RIGHT CAROTID ARTERY: Heterogeneous plaque at the right carotid bulb and proximal internal carotid. Right external carotid artery is patent with normal waveforms and velocity. Normal waveforms and velocities in the right internal carotid artery. No significant stenosis in the right internal carotid artery. RIGHT VERTEBRAL ARTERY: Antegrade flow and normal waveform in the right vertebral artery. LEFT CAROTID ARTERY: Small amount of echogenic plaque at the left carotid bulb. Left external carotid artery is patent with normal waveforms and velocities. Left internal carotid artery is patent without significant plaque or stenosis. Normal waveforms and velocities in the left internal carotid artery. LEFT VERTEBRAL ARTERY: Antegrade flow and normal waveform in the left vertebral artery. IMPRESSION: Mild atherosclerotic disease in the carotid arteries. Largest plaque burden is at the right carotid bulb and proximal right internal carotid artery. Estimated degree of stenosis in the internal carotid arteries is less than 50% bilaterally. Patent vertebral arteries. Electronically Signed   By: Markus Daft M.D.   On: 02/16/2016 15:06     Assessment/Plan   ICD-9-CM ICD-10-CM   1. Mitral valve mass 424.0 I05.9   2. LVH (left ventricular hypertrophy) 429.3 I51.7 metoprolol tartrate (LOPRESSOR) 50 MG tablet  3. Hollenhorst plaque, right eye 362.33 H34.211   4. Hyperlipidemia  272.4 E78.5   5. Essential hypertension, benign 401.1 I10 metoprolol tartrate (LOPRESSOR) 50 MG tablet  6. Pulmonary emphysema, unspecified emphysema type ([place]) 492.8 J43.9   7. Malignant neoplasm of prostate ([place]) 185 C61   8. Bilateral edema of lower extremity  782.3 R60.0    Change metoprolol to 50mg  daily (may finish current bottle by taking 2 daily until bottle empty then get new dose from pharmacy)  Check blood pressure at home daily. Call office if <100/50 or >180/100.   Check pulse (heart rate) at home and call office if <50 or >100.  Continue other medications as ordered  Follow up with cardiology (Dr Marlou Porch) as scheduled in May  Keep appt with urology as scheduled  Follow up here as scheduled  [place]. Perlie Gold  [facility] [address] [place], [place] [address] [phone] Cell (Monday-Friday 8 AM - 5 PM) [phone] After 5 PM and follow prompts

## 2016-02-24 DIAGNOSIS — N183 Chronic kidney disease, stage 3 (moderate): Secondary | ICD-10-CM | POA: Diagnosis not present

## 2016-02-24 DIAGNOSIS — K219 Gastro-esophageal reflux disease without esophagitis: Secondary | ICD-10-CM | POA: Diagnosis not present

## 2016-02-24 DIAGNOSIS — C7951 Secondary malignant neoplasm of bone: Secondary | ICD-10-CM | POA: Diagnosis not present

## 2016-02-24 DIAGNOSIS — N1330 Unspecified hydronephrosis: Secondary | ICD-10-CM | POA: Diagnosis not present

## 2016-02-24 DIAGNOSIS — E1165 Type 2 diabetes mellitus with hyperglycemia: Secondary | ICD-10-CM | POA: Diagnosis not present

## 2016-02-24 DIAGNOSIS — Z7984 Long term (current) use of oral hypoglycemic drugs: Secondary | ICD-10-CM | POA: Diagnosis not present

## 2016-02-24 DIAGNOSIS — Z9889 Other specified postprocedural states: Secondary | ICD-10-CM | POA: Diagnosis not present

## 2016-02-24 DIAGNOSIS — Z888 Allergy status to other drugs, medicaments and biological substances status: Secondary | ICD-10-CM | POA: Diagnosis not present

## 2016-02-24 DIAGNOSIS — E669 Obesity, unspecified: Secondary | ICD-10-CM | POA: Diagnosis not present

## 2016-02-24 DIAGNOSIS — C61 Malignant neoplasm of prostate: Secondary | ICD-10-CM | POA: Diagnosis not present

## 2016-02-24 DIAGNOSIS — I350 Nonrheumatic aortic (valve) stenosis: Secondary | ICD-10-CM | POA: Diagnosis not present

## 2016-02-24 DIAGNOSIS — Z6837 Body mass index (BMI) 37.0-37.9, adult: Secondary | ICD-10-CM | POA: Diagnosis not present

## 2016-02-24 DIAGNOSIS — Z466 Encounter for fitting and adjustment of urinary device: Secondary | ICD-10-CM | POA: Diagnosis not present

## 2016-02-24 DIAGNOSIS — Z87891 Personal history of nicotine dependence: Secondary | ICD-10-CM | POA: Diagnosis not present

## 2016-02-24 DIAGNOSIS — E785 Hyperlipidemia, unspecified: Secondary | ICD-10-CM | POA: Diagnosis not present

## 2016-02-24 DIAGNOSIS — N400 Enlarged prostate without lower urinary tract symptoms: Secondary | ICD-10-CM | POA: Diagnosis not present

## 2016-02-24 DIAGNOSIS — M1990 Unspecified osteoarthritis, unspecified site: Secondary | ICD-10-CM | POA: Diagnosis not present

## 2016-02-24 DIAGNOSIS — Z79899 Other long term (current) drug therapy: Secondary | ICD-10-CM | POA: Diagnosis not present

## 2016-02-24 DIAGNOSIS — I129 Hypertensive chronic kidney disease with stage 1 through stage 4 chronic kidney disease, or unspecified chronic kidney disease: Secondary | ICD-10-CM | POA: Diagnosis not present

## 2016-02-24 DIAGNOSIS — Z7901 Long term (current) use of anticoagulants: Secondary | ICD-10-CM | POA: Diagnosis not present

## 2016-02-24 DIAGNOSIS — N320 Bladder-neck obstruction: Secondary | ICD-10-CM | POA: Diagnosis not present

## 2016-02-24 DIAGNOSIS — Z7982 Long term (current) use of aspirin: Secondary | ICD-10-CM | POA: Diagnosis not present

## 2016-02-24 DIAGNOSIS — D649 Anemia, unspecified: Secondary | ICD-10-CM | POA: Diagnosis not present

## 2016-02-24 DIAGNOSIS — E1122 Type 2 diabetes mellitus with diabetic chronic kidney disease: Secondary | ICD-10-CM | POA: Diagnosis not present

## 2016-03-02 ENCOUNTER — Other Ambulatory Visit: Payer: Self-pay | Admitting: Internal Medicine

## 2016-03-08 ENCOUNTER — Encounter: Payer: Self-pay | Admitting: Internal Medicine

## 2016-03-08 ENCOUNTER — Ambulatory Visit (INDEPENDENT_AMBULATORY_CARE_PROVIDER_SITE_OTHER): Payer: Medicare Other | Admitting: Internal Medicine

## 2016-03-08 VITALS — BP 158/82 | HR 60 | Temp 98.2°F | Wt 265.0 lb

## 2016-03-08 DIAGNOSIS — J441 Chronic obstructive pulmonary disease with (acute) exacerbation: Secondary | ICD-10-CM

## 2016-03-08 DIAGNOSIS — H4312 Vitreous hemorrhage, left eye: Secondary | ICD-10-CM | POA: Diagnosis not present

## 2016-03-08 DIAGNOSIS — J209 Acute bronchitis, unspecified: Secondary | ICD-10-CM

## 2016-03-08 MED ORDER — ALBUTEROL SULFATE HFA 108 (90 BASE) MCG/ACT IN AERS: 2.0000 | INHALATION_SPRAY | Freq: Four times a day (QID) | RESPIRATORY_TRACT | Status: DC | PRN

## 2016-03-08 MED ORDER — DOXYCYCLINE HYCLATE 100 MG PO TABS: 100.0000 mg | ORAL_TABLET | Freq: Two times a day (BID) | ORAL | Status: DC

## 2016-03-08 NOTE — Patient Instructions (Signed)
I recommend you use an albuterol inhaler in the short term for this bronchitis and wheezing. Also, take doxycycline 100mg  twice a day for 10 days. Drink plenty of water and get enough rest Also, eat yogurt while taking the antibiotic. Let us know if your coughing and wheezing does not get better. Diabetic cough syrup.

## 2016-03-08 NOTE — Progress Notes (Signed)
Patient ID: Jeremiah Collins, male   DOB: 05/10/1934, 82 y.o.   MRN: [id number]   Location:  [facility] Provider: Khole Arterburn L. Mariea Clonts, D.O., C.M.D. PCP:  Dr. Eulas Post  Goals of Care:  Advanced Directives 03/08/2016  Does patient have an advance directive? No  Would patient like information on creating an advanced directive? -     Chief Complaint  Patient presents with  . Acute Visit    wheezing    HPI: Patient is a 82 y.o. male with h/o htn, orthostatic hypotension, emphysema, DMII with renal complications and peripheral neuropathy, osteoarthritis, prostate cancer on lupron with prior XRT and bone mets to left midtibial shaft where got XRT also seen today for an acute visit for wheezing which is new for him.    Dr. Judd Lien who is going to do a procedure on him next week for a broken blood vessel in the back of his left eye (going to stop bleeding and drain blood in his eyeball) noticed that he was wheezing.  He is wearing a bracelet that says he has a gas bubble in his eye and cannot get nitrous oxide.  Has had a cold for about 2 wks.  He thinks it's on the way out.  Has a cough.  Is bringing up some mucus, mostly white, a little green tint.  No fever, chills.  No chest pain or tightness.  He can tell he is wheezing when he's laying down.    Past Medical History  Diagnosis Date  . HTN (hypertension)   . Other and unspecified hyperlipidemia 01/02/2013  . Routine general medical examination at a health care facility   . Acute bronchitis 09/12/2012  . Cellulitis and abscess of leg, except foot 01/17/2012  . Gross hematuria 09/06/2011  . Osteoarthrosis involving, or with mention of more than one site, but not specified as generalized, multiple sites 10/22/2010  . Reflux esophagitis 05/10/2008  . First degree atrioventricular block 04/25/2007  . 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 Palpitations 04/28/2004  . Chest pain, unspecified 04/28/2004  . Closed fracture of five ribs 03/24/2004  . Spinal  stenosis, unspecified region other than cervical 04/29/1995  . Other abnormal blood chemistry 04/29/1991  . Cataract     Dr.Groat  . Type II or unspecified type diabetes mellitus without mention of complication, uncontrolled   . Prostate cancer ([place]) 12/16/1994    gleason 7  . Hx of radiation therapy 11/1998    prostate fossa - 6040 cGy, 33 fx, Dr Danny Lawless  . Bone metastases ([place])     left mid tibial shaft  . History of radiation therapy 04/03/14- 04/17/14    mid to distal left tibia 3000 cGy in 10 sessions    Past Surgical History  Procedure Laterality Date  . Left ureteral stent placement  Week of 12/05/11  . Prostatectomy  1996    Dr. Rosana Hoes  . Eye surgery  2006    cataract, Dr Shanon Rosser  . Cystoscopy w/ ureteral stent placement  12/31/14  . Cystoscopy w/ ureteral stent placement  05/16/2015  . Pars plana vitrectomy Left 01/09/2016    Procedure: PARS PLANA VITRECTOMY WITH 25 GAUGE LEFT EYE ;  Surgeon: Jalene Mullet, MD;  Location: [place];  Service: Ophthalmology;  Laterality: Left;  . Air/fluid exchange Left 01/09/2016    Procedure: AIR/FLUID EXCHANGE;  Surgeon: Jalene Mullet, MD;  Location: [place];  Service: Ophthalmology;  Laterality: Left;  . Laser photo ablation Left 01/09/2016    Procedure: LASER PHOTO ABLATION;  Surgeon:  Jalene Mullet, MD;  Location: [place];  Service: Ophthalmology;  Laterality: Left;  Endolaser  . Perfluorone injection Left 01/09/2016    Procedure: PERFLUORONE INJECTION;  Surgeon: Jalene Mullet, MD;  Location: [place];  Service: Ophthalmology;  Laterality: Left;    Allergies  Allergen Reactions  . Abiraterone Other (See Comments) and Nausea And Vomiting    Other reaction(s): Increased Heart Rate (intolerance)      Medication List       This list is accurate as of: 03/08/16 11:43 AM.  Always use your most recent med list.               aspirin 81 MG tablet  Take 1 tablet (81 mg total) by mouth daily.     Cholecalciferol 1000 units tablet  Take 1,000 Units  by mouth daily.     COLACE 100 MG capsule  Generic drug:  docusate sodium  Take 100 mg by mouth.     doxazosin 4 MG tablet  Commonly known as:  CARDURA  TAKE ONE TABLET BY MOUTH ONCE DAILY     leuprolide acetate (6 Month) 45 MG injection  Generic drug:  leuprolide (6 Month)  Inject 45 mg into the skin every 6 (six) months. Filled by [facility]     lisinopril-hydrochlorothiazide 20-12.5 MG tablet  Commonly known as:  PRINZIDE,ZESTORETIC  TAKE ONE TABLET BY MOUTH DAILY     metFORMIN 500 MG tablet  Commonly known as:  GLUCOPHAGE  TAKE ONE TABLET BY MOUTH ONCE DAILY WITH  BREAKFAST     metoprolol 50 MG tablet  Commonly known as:  LOPRESSOR  Take 1 tablet (50 mg total) by mouth daily.     simvastatin 5 MG tablet  Commonly known as:  ZOCOR  TAKE ONE TABLET BY MOUTH DAILY     ULTRAM 50 MG tablet  Generic drug:  traMADol  Take 50 mg by mouth.        Review of Systems:  Review of Systems  Constitutional: Negative for fever and chills.  HENT: Positive for congestion. Negative for sore throat.   Respiratory: Positive for cough, sputum production and wheezing. Negative for hemoptysis and shortness of breath.   Cardiovascular: Negative for chest pain, palpitations and leg swelling.  Neurological: Negative for headaches.    Health Maintenance  Topic Date Due  . ZOSTAVAX  09/15/1996  . OPHTHALMOLOGY EXAM  06/04/2015  . HEMOGLOBIN A1C  02/18/2016  . FOOT EXAM  04/15/2016  . INFLUENZA VACCINE  06/15/2016  . TETANUS/TDAP  11/08/2023  . PNA vac Low Risk Adult  Completed    Physical Exam: Filed Vitals:   03/08/16 1119  BP: 158/82  Pulse: 60  Temp: 98.2 F (36.8 C)  TempSrc: Oral  Weight: 265 lb (120.203 kg)  SpO2: 97%   Body mass index is 35.93 kg/(m^2). Physical Exam  Constitutional: He is oriented to person, place, and time. He appears well-developed and well-nourished. No distress.  Obese white male   HENT:  Mouth/Throat: No oropharyngeal exudate.  Neck:  Neck supple. No JVD present.  Cardiovascular: Normal rate, regular rhythm, normal heart sounds and intact distal pulses.   Pulmonary/Chest: Effort normal. He has wheezes. He has no rales.  Inspiratory wheezing, some upper airway rhonchi  Lymphadenopathy:    He has no cervical adenopathy.  Neurological: He is alert and oriented to person, place, and time.  Skin: Skin is warm and dry.  Psychiatric:  Flat affect    Labs reviewed: Basic Metabolic Panel:  Recent  Labs  01/09/16 1628  NA 141  K 3.7  CL 104  GLUCOSE 106*  BUN 22*  CREATININE 1.20   Liver Function Tests: No results for input(s): AST, ALT, ALKPHOS, BILITOT, PROT, ALBUMIN in the last 8760 hours. No results for input(s): LIPASE, AMYLASE in the last 8760 hours. No results for input(s): AMMONIA in the last 8760 hours. CBC:  Recent Labs  01/09/16 1628  HGB 11.9*  HCT 35.0*   Lipid Panel:  Recent Labs  04/16/15 0906  CHOL 160  HDL 37*  LDLCALC 103*  TRIG 101  CHOLHDL 4.3   Lab Results  Component Value Date   HGBA1C 6.4* 08/20/2015    Procedures since last visit: US Carotid Duplex Bilateral  02/16/2016  CLINICAL DATA:  Hollenhorst plaque. EXAM: BILATERAL CAROTID DUPLEX ULTRASOUND TECHNIQUE: Pearline Cables scale imaging, color Doppler and duplex ultrasound were performed of bilateral carotid and vertebral arteries in the neck. COMPARISON:  None. FINDINGS: Criteria: Quantification of carotid stenosis is based on velocity parameters that correlate the residual internal carotid diameter with NASCET-based stenosis levels, using the diameter of the distal internal carotid lumen as the denominator for stenosis measurement. The following velocity measurements were obtained: RIGHT ICA:  108 cm/sec CCA:  65 cm/sec SYSTOLIC ICA/CCA RATIO:  1.9 DIASTOLIC ICA/CCA RATIO:  2.9 ECA:  124 cm/sec LEFT ICA:  74 cm/sec CCA:  [id number] cm/sec SYSTOLIC ICA/CCA RATIO:  0.7 DIASTOLIC ICA/CCA RATIO:  0.9 ECA:  81 cm/sec RIGHT CAROTID ARTERY:  Heterogeneous plaque at the right carotid bulb and proximal internal carotid. Right external carotid artery is patent with normal waveforms and velocity. Normal waveforms and velocities in the right internal carotid artery. No significant stenosis in the right internal carotid artery. RIGHT VERTEBRAL ARTERY: Antegrade flow and normal waveform in the right vertebral artery. LEFT CAROTID ARTERY: Small amount of echogenic plaque at the left carotid bulb. Left external carotid artery is patent with normal waveforms and velocities. Left internal carotid artery is patent without significant plaque or stenosis. Normal waveforms and velocities in the left internal carotid artery. LEFT VERTEBRAL ARTERY: Antegrade flow and normal waveform in the left vertebral artery. IMPRESSION: Mild atherosclerotic disease in the carotid arteries. Largest plaque burden is at the right carotid bulb and proximal right internal carotid artery. Estimated degree of stenosis in the internal carotid arteries is less than 50% bilaterally. Patent vertebral arteries. Electronically Signed   By: Markus Daft M.D.   On: 02/16/2016 15:06    Assessment/Plan 1. COPD with acute exacerbation ([place]) - pt denies having copd or emphysema, but it is in his history -does admit he used to have inhalers that "didn't make any difference" -albuterol (PROVENTIL HFA;VENTOLIN HFA) 108 (90 Base) MCG/ACT inhaler; Inhale 2 puffs into the lungs every 6 (six) hours as needed for wheezing or shortness of breath.  Dispense: 1 Inhaler; Refill: 2 - doxycycline (VIBRA-TABS) 100 MG tablet; Take 1 tablet (100 mg total) by mouth 2 (two) times daily.  Dispense: 20 tablet; Refill: 0  2. Acute bronchitis, unspecified organism -as above -should be doing well enough by next week for his eye procedure   Labs/tests ordered:  No orders of the defined types were placed in this encounter.   Next appt:  04/21/2016  Lyan Moyano L. Quintavious Rinck, D.O. [facility] [address], [place] [address] Cell Phone (Mon-Fri 8am-5pm):  [phone] On Call:  [phone] & follow prompts after 5pm & weekends Office Phone:  [phone] Office Fax:  [phone]

## 2016-03-11 DIAGNOSIS — C7951 Secondary malignant neoplasm of bone: Secondary | ICD-10-CM | POA: Diagnosis not present

## 2016-03-11 DIAGNOSIS — C61 Malignant neoplasm of prostate: Secondary | ICD-10-CM | POA: Diagnosis not present

## 2016-03-11 DIAGNOSIS — Z7982 Long term (current) use of aspirin: Secondary | ICD-10-CM | POA: Diagnosis not present

## 2016-03-11 DIAGNOSIS — Z923 Personal history of irradiation: Secondary | ICD-10-CM | POA: Diagnosis not present

## 2016-03-11 DIAGNOSIS — I129 Hypertensive chronic kidney disease with stage 1 through stage 4 chronic kidney disease, or unspecified chronic kidney disease: Secondary | ICD-10-CM | POA: Diagnosis not present

## 2016-03-11 DIAGNOSIS — N183 Chronic kidney disease, stage 3 (moderate): Secondary | ICD-10-CM | POA: Diagnosis not present

## 2016-03-11 DIAGNOSIS — Z9889 Other specified postprocedural states: Secondary | ICD-10-CM | POA: Diagnosis not present

## 2016-03-11 DIAGNOSIS — Z7984 Long term (current) use of oral hypoglycemic drugs: Secondary | ICD-10-CM | POA: Diagnosis not present

## 2016-03-15 ENCOUNTER — Ambulatory Visit (INDEPENDENT_AMBULATORY_CARE_PROVIDER_SITE_OTHER): Payer: Medicare Other | Admitting: Cardiology

## 2016-03-15 ENCOUNTER — Encounter: Payer: Self-pay | Admitting: Cardiology

## 2016-03-15 VITALS — BP 142/74 | HR 84 | Ht 71.0 in | Wt 268.0 lb

## 2016-03-15 DIAGNOSIS — I059 Rheumatic mitral valve disease, unspecified: Secondary | ICD-10-CM | POA: Diagnosis not present

## 2016-03-15 DIAGNOSIS — I700 Atherosclerosis of aorta: Secondary | ICD-10-CM

## 2016-03-15 DIAGNOSIS — I3481 Nonrheumatic mitral (valve) annulus calcification: Secondary | ICD-10-CM

## 2016-03-15 DIAGNOSIS — E785 Hyperlipidemia, unspecified: Secondary | ICD-10-CM | POA: Diagnosis not present

## 2016-03-15 DIAGNOSIS — H34219 Partial retinal artery occlusion, unspecified eye: Secondary | ICD-10-CM

## 2016-03-15 DIAGNOSIS — IMO0001 Reserved for inherently not codable concepts without codable children: Secondary | ICD-10-CM

## 2016-03-15 DIAGNOSIS — I10 Essential (primary) hypertension: Secondary | ICD-10-CM

## 2016-03-15 DIAGNOSIS — H3589 Other specified retinal disorders: Secondary | ICD-10-CM

## 2016-03-15 NOTE — Patient Instructions (Signed)
Medication Instructions:  The current medical regimen is effective;  continue present plan and medications.  Follow-Up: Follow up as needed.  If you need a refill on your cardiac medications before your next appointment, please call your pharmacy.  Thank you for choosing [facility]!!     

## 2016-03-15 NOTE — Progress Notes (Signed)
Cardiology Office Note    Date:  03/15/2016   ID:  Jeremiah Collins, DOB 11-07-1934, MRN [id number]  PCP:  [name], DO  Cardiologist:   Candee Furbish, MD     History of Present Illness:  Jeremiah Collins is a 82 y.o. male here for evaluation of abnormal echo gram. Echocardiogram in March 2017 demonstrated calcified mass at the mitral valve. There was question whether this could be related to ocular issues noted by Dr. Tempie Hoist.  I personally reviewed echocardiogram. It looks like he has dense mitral annular calcification, not particularly a mass on the posterior mitral valve leaflet.  He has not experienced any prior stroke, heart attack, syncope, palpitations. His EKG is unremarkable. Used to smoke in the 70s cigars but no longer.  Dr. Posey Pronto, his ophthalmologist noted Hollenhorst plaque in the retina which could be secondary to plaque shift/embolic phenomenon. Certainly he has both mitral annular calcification as well as aortic valve calcification both of which theoretically could release small particles of calcium causing this retinal malformation.  He is able to traverse 1-2 flights of stairs without difficulty. No anginal symptoms during normal activity. Denies any syncope. No heart failure symptoms. He is treated for hypertension. He is on very low-dose statin simvastatin 5 mg. He has undergone several urologic procedures for ureteral stents. No difficulty.    Past Medical History  Diagnosis Date  . HTN (hypertension)   . Other and unspecified hyperlipidemia 01/02/2013  . Routine general medical examination at a health care facility   . Acute bronchitis 09/12/2012  . Cellulitis and abscess of leg, except foot 01/17/2012  . Gross hematuria 09/06/2011  . Osteoarthrosis involving, or with mention of more than one site, but not specified as generalized, multiple sites 10/22/2010  . Reflux esophagitis 05/10/2008  . First degree atrioventricular block 04/25/2007  .  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 Palpitations 04/28/2004  . Chest pain, unspecified 04/28/2004  . Closed fracture of five ribs 03/24/2004  . Spinal stenosis, unspecified region other than cervical 04/29/1995  . Other abnormal blood chemistry 04/29/1991  . Cataract     Dr.Groat  . Type II or unspecified type diabetes mellitus without mention of complication, uncontrolled   . Prostate cancer ([place]) 12/16/1994    gleason 7  . Hx of radiation therapy 11/1998    prostate fossa - 6040 cGy, 33 fx, Dr Danny Lawless  . Bone metastases ([place])     left mid tibial shaft  . History of radiation therapy 04/03/14- 04/17/14    mid to distal left tibia 3000 cGy in 10 sessions    Past Surgical History  Procedure Laterality Date  . Left ureteral stent placement  Week of 12/05/11  . Prostatectomy  1996    Dr. Rosana Hoes  . Eye surgery  2006    cataract, Dr Shanon Rosser  . Cystoscopy w/ ureteral stent placement  12/31/14  . Cystoscopy w/ ureteral stent placement  05/16/2015  . Pars plana vitrectomy Left 01/09/2016    Procedure: PARS PLANA VITRECTOMY WITH 25 GAUGE LEFT EYE ;  Surgeon: Jalene Mullet, MD;  Location: [place];  Service: Ophthalmology;  Laterality: Left;  . Air/fluid exchange Left 01/09/2016    Procedure: AIR/FLUID EXCHANGE;  Surgeon: Jalene Mullet, MD;  Location: [place];  Service: Ophthalmology;  Laterality: Left;  . Laser photo ablation Left 01/09/2016    Procedure: LASER PHOTO ABLATION;  Surgeon: Jalene Mullet, MD;  Location: [place];  Service: Ophthalmology;  Laterality: Left;  Endolaser  . Perfluorone injection  Left 01/09/2016    Procedure: PERFLUORONE INJECTION;  Surgeon: Jalene Mullet, MD;  Location: [place];  Service: Ophthalmology;  Laterality: Left;    Current Medications: Outpatient Prescriptions Prior to Visit  Medication Sig Dispense Refill  . albuterol (PROVENTIL HFA;VENTOLIN HFA) 108 (90 Base) MCG/ACT inhaler Inhale 2 puffs into the lungs every 6 (six) hours as needed for wheezing or shortness of breath. 1  Inhaler 2  . aspirin 81 MG tablet Take 1 tablet (81 mg total) by mouth daily. 30 tablet 3  . Cholecalciferol 1000 UNITS tablet Take 1,000 Units by mouth daily.    Marland Kitchen docusate sodium (COLACE) 100 MG capsule Take 100 mg by mouth.    . doxazosin (CARDURA) 4 MG tablet TAKE ONE TABLET BY MOUTH ONCE DAILY 30 tablet 1  . doxycycline (VIBRA-TABS) 100 MG tablet Take 1 tablet (100 mg total) by mouth 2 (two) times daily. 20 tablet 0  . leuprolide, 6 Month, (LEUPROLIDE ACETATE, 6 MONTH,) 45 MG injection Inject 45 mg into the skin every 6 (six) months. Filled by [facility] 1 each 1  . lisinopril-hydrochlorothiazide (PRINZIDE,ZESTORETIC) 20-12.5 MG tablet TAKE ONE TABLET BY MOUTH DAILY 90 tablet 1  . metFORMIN (GLUCOPHAGE) 500 MG tablet TAKE ONE TABLET BY MOUTH ONCE DAILY WITH  BREAKFAST 90 tablet 1  . metoprolol tartrate (LOPRESSOR) 50 MG tablet Take 1 tablet (50 mg total) by mouth daily. 30 tablet 6  . simvastatin (ZOCOR) 5 MG tablet TAKE ONE TABLET BY MOUTH DAILY 30 tablet 5  . traMADol (ULTRAM) 50 MG tablet Take 50 mg by mouth.     No facility-administered medications prior to visit.     Allergies:   Abiraterone   Social History   Social History  . Marital Status: Married    Spouse Name: N/A  . Number of Children: N/A  . Years of Education: N/A   Social History Main Topics  . Smoking status: Former Smoker    Types: Cigars    Quit date: 12/16/1968  . Smokeless tobacco: Never Used  . Alcohol Use: No  . Drug Use: No  . Sexual Activity: Not Asked   Other Topics Concern  . None   Social History Narrative     Family History:  The patient's family history includes Heart disease in his father; Stroke in his sister.   ROS:   Please see the history of present illness.   No syncope, no bleeding. ROS All other systems reviewed and are negative.   PHYSICAL EXAM:   VS:  BP 142/74 mmHg  Pulse 84  Ht 5\' 11"  (1.803 m)  Wt 268 lb (121.564 kg)  BMI 37.39 kg/m2   GEN: Well nourished, well  developed, in no acute distress HEENT: normal Neck: no JVD, carotid bruits, or masses Cardiac: RRR; 2/6 SEM RUSB, no rubs, or gallops,no edema  Respiratory:  clear to auscultation bilaterally, normal work of breathing GI: soft, nontender, nondistended, + BS overweight MS: no deformity or atrophy Skin: warm and dry, no rash Neuro:  Alert and Oriented x 3, Strength and sensation are intact Psych: euthymic mood, full affect  Wt Readings from Last 3 Encounters:  03/15/16 268 lb (121.564 kg)  03/08/16 265 lb (120.203 kg)  02/20/16 269 lb 12.8 oz (122.38 kg)      Studies/Labs Reviewed:   EKG:  EKG is not ordered today.  Previous EKG demonstrated no electrical abnormalities. No conduction disorders (sometimes associated with mitral annular calcification). Normal rhythm.  Recent Labs: 01/09/2016: BUN 22*; Creatinine, Ser  1.20; Hemoglobin 11.9*; Potassium 3.7; Sodium 141   Lipid Panel    Component Value Date/Time   CHOL 160 04/16/2015 0906   TRIG 101 04/16/2015 0906   HDL 37* 04/16/2015 0906   CHOLHDL 4.3 04/16/2015 0906   LDLCALC 103* 04/16/2015 0906    Additional studies/ records that were reviewed today include:  Carotid Dopplers 02/16/16: Mild atherosclerotic disease in the carotid arteries. Largest plaque burden is at the right carotid bulb and proximal right internal carotid artery. Estimated degree of stenosis in the internal carotid arteries is less than 50% bilaterally.  Patent vertebral arteries.   ASSESSMENT:    1. Mitral annular calcification   2. Aortic sclerosis ([place])   3. Essential hypertension   4. Hyperlipidemia   5. Hollenhorst plaque      PLAN:  In order of problems listed above:  Mitral annular calcification -Personally reviewed echocardiogram, appears to be dense mitral annular calcification in the posterior segment of the mitral valve apparatus. This is commonly seen in aging individuals with prior hypertension. This is not a condition that requires  mitral valve surgery unless associated with significant mitral regurgitation which he does not have. He does not have any associated electrical abnormalities on EKG. Reassurance. Sometimes dense mitral annular calcification is associated with bundle branch blocks for instance. No further cardiac workup necessary. Continue with aggressive secondary risk prevention. I would increase statin therapy. I suggest increasing his simvastatin from 5 mg up to 40 mg or perhaps changing him over to atorvastatin 40 mg for a more high intensity statin dose especially given his carotid plaque/evidence of atherosclerosis. We discussed this at length and I do believe that the risk versus benefit of statin therapy in his situation would be overall beneficial.  Aortic valve calcification/aortic valve sclerosis -No significant stenosis identified. No indications for surgery. Murmur on exam. Continue to monitor clinically.  Carotid artery plaque/atherosclerosis -Intensify statin therapy. No indication for surgery.  Preoperative risk stratification -He is about to undergo left eye surgery under mild sedation. Based upon his ability to complete greater than 4 METS of activity without any difficulty, normal ejection fraction, I do believe that he may proceed with low overall cardiac risk for this procedure. I discussed with his ophthalmologist Dr. Posey Pronto, phone number [phone]. Hollenhorst plaque noted in his retina. Once again, there is no surgical indication for carotid endarterectomy flash valve procedure. Continue to intensify his secondary prevention strategies, i.e. intensify statin therapy, monitor blood pressure for ultimate control.  Medication Adjustments/Labs and Tests Ordered: Current medicines are reviewed at length with the patient today.  Concerns regarding medicines are outlined above.  Medication changes, Labs and Tests ordered today are listed in the Patient Instructions below. Patient Instructions    Medication Instructions:  The current medical regimen is effective;  continue present plan and medications.  Follow-Up: Follow up as needed.  If you need a refill on your cardiac medications before your next appointment, please call your pharmacy.  Thank you for choosing [facility]!!          Signed, Candee Furbish, MD  03/15/2016 11:00 AM    [facility] [address], [place], [place]  [address] Phone: [phone]; Fax: [phone]

## 2016-03-17 DIAGNOSIS — H3509 Other intraretinal microvascular abnormalities: Secondary | ICD-10-CM | POA: Diagnosis not present

## 2016-03-17 DIAGNOSIS — H4312 Vitreous hemorrhage, left eye: Secondary | ICD-10-CM | POA: Diagnosis not present

## 2016-03-17 DIAGNOSIS — H3562 Retinal hemorrhage, left eye: Secondary | ICD-10-CM | POA: Diagnosis not present

## 2016-03-19 ENCOUNTER — Encounter: Payer: Self-pay | Admitting: Internal Medicine

## 2016-04-06 DIAGNOSIS — H35352 Cystoid macular degeneration, left eye: Secondary | ICD-10-CM | POA: Diagnosis not present

## 2016-04-06 DIAGNOSIS — H3509 Other intraretinal microvascular abnormalities: Secondary | ICD-10-CM | POA: Diagnosis not present

## 2016-04-06 DIAGNOSIS — H31092 Other chorioretinal scars, left eye: Secondary | ICD-10-CM | POA: Diagnosis not present

## 2016-04-06 DIAGNOSIS — H35372 Puckering of macula, left eye: Secondary | ICD-10-CM | POA: Diagnosis not present

## 2016-04-06 DIAGNOSIS — H53142 Visual discomfort, left eye: Secondary | ICD-10-CM | POA: Diagnosis not present

## 2016-04-15 DIAGNOSIS — H35372 Puckering of macula, left eye: Secondary | ICD-10-CM | POA: Diagnosis not present

## 2016-04-15 DIAGNOSIS — H31092 Other chorioretinal scars, left eye: Secondary | ICD-10-CM | POA: Diagnosis not present

## 2016-04-15 DIAGNOSIS — H53142 Visual discomfort, left eye: Secondary | ICD-10-CM | POA: Diagnosis not present

## 2016-04-15 DIAGNOSIS — H3562 Retinal hemorrhage, left eye: Secondary | ICD-10-CM | POA: Diagnosis not present

## 2016-04-15 DIAGNOSIS — H3509 Other intraretinal microvascular abnormalities: Secondary | ICD-10-CM | POA: Diagnosis not present

## 2016-04-21 ENCOUNTER — Other Ambulatory Visit: Payer: Medicare Other

## 2016-04-21 DIAGNOSIS — E0821 Diabetes mellitus due to underlying condition with diabetic nephropathy: Secondary | ICD-10-CM | POA: Diagnosis not present

## 2016-04-21 DIAGNOSIS — E785 Hyperlipidemia, unspecified: Secondary | ICD-10-CM | POA: Diagnosis not present

## 2016-04-22 DIAGNOSIS — D649 Anemia, unspecified: Secondary | ICD-10-CM | POA: Diagnosis not present

## 2016-04-22 DIAGNOSIS — N261 Atrophy of kidney (terminal): Secondary | ICD-10-CM | POA: Diagnosis not present

## 2016-04-22 DIAGNOSIS — I129 Hypertensive chronic kidney disease with stage 1 through stage 4 chronic kidney disease, or unspecified chronic kidney disease: Secondary | ICD-10-CM | POA: Diagnosis not present

## 2016-04-22 DIAGNOSIS — Z79899 Other long term (current) drug therapy: Secondary | ICD-10-CM | POA: Diagnosis not present

## 2016-04-22 DIAGNOSIS — Z8546 Personal history of malignant neoplasm of prostate: Secondary | ICD-10-CM | POA: Diagnosis not present

## 2016-04-22 DIAGNOSIS — E1122 Type 2 diabetes mellitus with diabetic chronic kidney disease: Secondary | ICD-10-CM | POA: Diagnosis not present

## 2016-04-22 DIAGNOSIS — Z7982 Long term (current) use of aspirin: Secondary | ICD-10-CM | POA: Diagnosis not present

## 2016-04-22 DIAGNOSIS — C61 Malignant neoplasm of prostate: Secondary | ICD-10-CM | POA: Diagnosis not present

## 2016-04-22 DIAGNOSIS — Z9079 Acquired absence of other genital organ(s): Secondary | ICD-10-CM | POA: Diagnosis not present

## 2016-04-22 DIAGNOSIS — Z7984 Long term (current) use of oral hypoglycemic drugs: Secondary | ICD-10-CM | POA: Diagnosis not present

## 2016-04-22 DIAGNOSIS — C7951 Secondary malignant neoplasm of bone: Secondary | ICD-10-CM | POA: Diagnosis not present

## 2016-04-22 DIAGNOSIS — Z888 Allergy status to other drugs, medicaments and biological substances status: Secondary | ICD-10-CM | POA: Diagnosis not present

## 2016-04-22 DIAGNOSIS — Z923 Personal history of irradiation: Secondary | ICD-10-CM | POA: Diagnosis not present

## 2016-04-22 DIAGNOSIS — N183 Chronic kidney disease, stage 3 (moderate): Secondary | ICD-10-CM | POA: Diagnosis not present

## 2016-04-22 DIAGNOSIS — Z08 Encounter for follow-up examination after completed treatment for malignant neoplasm: Secondary | ICD-10-CM | POA: Diagnosis not present

## 2016-04-22 LAB — HEMOGLOBIN A1C
ESTIMATED AVERAGE GLUCOSE: 134 mg/dL
Hgb A1c MFr Bld: 6.3 % — ABNORMAL HIGH (ref 4.8–5.6)

## 2016-04-22 LAB — LIPID PANEL
CHOLESTEROL TOTAL: 148 mg/dL (ref 100–199)
Chol/HDL Ratio: 3.7 ratio units (ref 0.0–5.0)
HDL: 40 mg/dL (ref >39)
LDL CALC: 95 mg/dL (ref 0–99)
Triglycerides: 65 mg/dL (ref 0–149)
VLDL Cholesterol Cal: 13 mg/dL (ref 5–40)

## 2016-04-23 ENCOUNTER — Ambulatory Visit (INDEPENDENT_AMBULATORY_CARE_PROVIDER_SITE_OTHER): Payer: Medicare Other | Admitting: Internal Medicine

## 2016-04-23 ENCOUNTER — Encounter: Payer: Self-pay | Admitting: Internal Medicine

## 2016-04-23 VITALS — BP 142/72 | HR 63 | Temp 97.9°F | Ht 71.0 in | Wt 271.8 lb

## 2016-04-23 DIAGNOSIS — E785 Hyperlipidemia, unspecified: Secondary | ICD-10-CM | POA: Diagnosis not present

## 2016-04-23 DIAGNOSIS — I059 Rheumatic mitral valve disease, unspecified: Secondary | ICD-10-CM | POA: Diagnosis not present

## 2016-04-23 DIAGNOSIS — I10 Essential (primary) hypertension: Secondary | ICD-10-CM | POA: Diagnosis not present

## 2016-04-23 DIAGNOSIS — M150 Primary generalized (osteo)arthritis: Secondary | ICD-10-CM | POA: Diagnosis not present

## 2016-04-23 DIAGNOSIS — R600 Localized edema: Secondary | ICD-10-CM | POA: Diagnosis not present

## 2016-04-23 DIAGNOSIS — C61 Malignant neoplasm of prostate: Secondary | ICD-10-CM | POA: Diagnosis not present

## 2016-04-23 DIAGNOSIS — E0821 Diabetes mellitus due to underlying condition with diabetic nephropathy: Secondary | ICD-10-CM | POA: Diagnosis not present

## 2016-04-23 DIAGNOSIS — I3481 Nonrheumatic mitral (valve) annulus calcification: Secondary | ICD-10-CM

## 2016-04-23 DIAGNOSIS — M159 Polyosteoarthritis, unspecified: Secondary | ICD-10-CM

## 2016-04-23 NOTE — Patient Instructions (Addendum)
Continue current medications as ordered  Follow up with Dr Judd Lien as scheduled  Follow up with Urology next week as scheduled  Follow up in 4 mos for CPE. Fasting labs prior to appt

## 2016-04-23 NOTE — Progress Notes (Signed)
Patient ID: Jeremiah Collins, male   DOB: September 03, 1934, 82 y.o.   MRN: [id number]    Location:  [facility] Place of Service: OFFICE  Chief Complaint  Patient presents with  . Medical Management of Chronic Issues    4 month follow up    HPI:  82 yo male seen today for f/u. Bronchitis resolved. He reports feeling better since last OV. No other concerns  OD Hollenhorst plaque - he had sx x 2 as the 1st procedure was ineffective. Vision improved after 2nd sx. He is followed by Dr Judd Lien (ophthamology)  2 D echo revealed mild AS and calcified mass on posterior leaflet of mitral valve with severe LVH. nml heart fxn with nml EF. He has SOB. No CP or dizziness. No leg swelling. He saw cardio and determined to have mitral annular calcification but no sx recommended at this time. Cardio recommended increase in statin tx but pt declines  hx prostate CA and rec'd XRT. He is currently on leuprolide injection q66mos. He also takes cardura. He went for surveillance CT chest/abd/pelvis and whole body bone scan yesterday at [facility]. No changes compared to previous studies. He will f/u with urology next week  DM - BS 120s usually. He checks once weekly. No low BS reactions. No numbness or tingling. He takes metformin. A1c 6.3%  HTN - BP stable on lisinopril-HCT and metoprolol. Takes ASA daily  Hyperlipidemia - stable on statin. Occasional muscle weakness. LDL 95  Multiple joint pain - mostly in back. He does not take any meds  Past Medical History  Diagnosis Date  . HTN (hypertension)   . Other and unspecified hyperlipidemia 01/02/2013  . Routine general medical examination at a health care facility   . Acute bronchitis 09/12/2012  . Cellulitis and abscess of leg, except foot 01/17/2012  . Gross hematuria 09/06/2011  . Osteoarthrosis involving, or with mention of more than one site, but not specified as generalized, multiple sites 10/22/2010  . Reflux esophagitis 05/10/2008  . First degree  atrioventricular block 04/25/2007  . 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 Palpitations 04/28/2004  . Chest pain, unspecified 04/28/2004  . Closed fracture of five ribs 03/24/2004  . Spinal stenosis, unspecified region other than cervical 04/29/1995  . Other abnormal blood chemistry 04/29/1991  . Cataract     Dr.Groat  . Type II or unspecified type diabetes mellitus without mention of complication, uncontrolled   . Prostate cancer ([place]) 12/16/1994    gleason 7  . Hx of radiation therapy 11/1998    prostate fossa - 6040 cGy, 33 fx, Dr Danny Lawless  . Bone metastases ([place])     left mid tibial shaft  . History of radiation therapy 04/03/14- 04/17/14    mid to distal left tibia 3000 cGy in 10 sessions    Past Surgical History  Procedure Laterality Date  . Left ureteral stent placement  Week of 12/05/11  . Prostatectomy  1996    Dr. Rosana Hoes  . Eye surgery  2006    cataract, Dr Shanon Rosser  . Cystoscopy w/ ureteral stent placement  12/31/14  . Cystoscopy w/ ureteral stent placement  05/16/2015  . Pars plana vitrectomy Left 01/09/2016    Procedure: PARS PLANA VITRECTOMY WITH 25 GAUGE LEFT EYE ;  Surgeon: Jalene Mullet, MD;  Location: [place];  Service: Ophthalmology;  Laterality: Left;  . Air/fluid exchange Left 01/09/2016    Procedure: AIR/FLUID EXCHANGE;  Surgeon: Jalene Mullet, MD;  Location: [place];  Service: Ophthalmology;  Laterality: Left;  .  Laser photo ablation Left 01/09/2016    Procedure: LASER PHOTO ABLATION;  Surgeon: Jalene Mullet, MD;  Location: [place];  Service: Ophthalmology;  Laterality: Left;  Endolaser  . Perfluorone injection Left 01/09/2016    Procedure: PERFLUORONE INJECTION;  Surgeon: Jalene Mullet, MD;  Location: [place];  Service: Ophthalmology;  Laterality: Left;    Patient Care Team: [name], DO as PCP - General (Internal Medicine) Myrlene Broker, MD as Attending Physician (Urology) [name], MD as Consulting Physician (Ophthalmology) [name], MD as Consulting  Physician (Ophthalmology)  Social History   Social History  . Marital Status: Married    Spouse Name: N/A  . Number of Children: N/A  . Years of Education: N/A   Occupational History  . Not on file.   Social History Main Topics  . Smoking status: Former Smoker    Types: Cigars    Quit date: 12/16/1968  . Smokeless tobacco: Never Used  . Alcohol Use: No  . Drug Use: No  . Sexual Activity: Not on file   Other Topics Concern  . Not on file   Social History Narrative     reports that he quit smoking about 47 years ago. His smoking use included Cigars. He has never used smokeless tobacco. He reports that he does not drink alcohol or use illicit drugs.  Family History  Problem Relation Age of Onset  . Heart disease Father   . Stroke Sister    Family Status  Relation Status Death Age  . Father Deceased   . Mother Deceased   . Sister Deceased   . Sister Deceased   . Sister Deceased      Allergies  Allergen Reactions  . Abiraterone Other (See Comments) and Nausea And Vomiting    Other reaction(s): Increased Heart Rate (intolerance)    Medications: Patient's Medications  New Prescriptions   No medications on file  Previous Medications   ASPIRIN 81 MG TABLET    Take 1 tablet (81 mg total) by mouth daily.   CHOLECALCIFEROL 1000 UNITS TABLET    Take 1,000 Units by mouth daily.   DOXAZOSIN (CARDURA) 4 MG TABLET    TAKE ONE TABLET BY MOUTH ONCE DAILY   DOXYCYCLINE (VIBRA-TABS) 100 MG TABLET    Take 1 tablet (100 mg total) by mouth 2 (two) times daily.   LEUPROLIDE, 6 MONTH, (LEUPROLIDE ACETATE, 6 MONTH,) 45 MG INJECTION    Inject 45 mg into the skin every 6 (six) months. Filled by [facility] (PRINZIDE,ZESTORETIC) 20-12.5 MG TABLET    TAKE ONE TABLET BY MOUTH DAILY   METFORMIN (GLUCOPHAGE) 500 MG TABLET    TAKE ONE TABLET BY MOUTH ONCE DAILY WITH  BREAKFAST   METOPROLOL TARTRATE (LOPRESSOR) 50 MG TABLET    Take 1 tablet (50 mg total)  by mouth daily.   SIMVASTATIN (ZOCOR) 5 MG TABLET    TAKE ONE TABLET BY MOUTH DAILY   TRAMADOL (ULTRAM) 50 MG TABLET    Take 50 mg by mouth.  Modified Medications   No medications on file  Discontinued Medications   ALBUTEROL (PROVENTIL HFA;VENTOLIN HFA) 108 (90 BASE) MCG/ACT INHALER    Inhale 2 puffs into the lungs every 6 (six) hours as needed for wheezing or shortness of breath.    Review of Systems  Respiratory: Positive for shortness of breath.   Musculoskeletal: Positive for back pain and arthralgias.  Skin: Negative for wound.  All other systems reviewed and are negative.  Filed Vitals:   04/23/16 0903  BP: 142/72  Pulse: 63  Temp: 97.9 F (36.6 C)  TempSrc: Oral  Height: 5\' 11"  (1.803 m)  Weight: 271 lb 12.8 oz (123.288 kg)  SpO2: 96%   Body mass index is 37.93 kg/(m^2).  Physical Exam  Constitutional: He is oriented to person, place, and time. He appears well-developed and well-nourished. No distress.  HENT:  Mouth/Throat: Oropharynx is clear and moist. No oropharyngeal exudate.  Eyes: Pupils are equal, round, and reactive to light. No scleral icterus.  Neck: Neck supple. No thyromegaly present.  Cardiovascular: Normal rate, regular rhythm and intact distal pulses.  Exam reveals no gallop, no distant heart sounds and no friction rub.   Murmur heard.  Systolic murmur is present with a grade of 2/6  +1 pitting LE edema b/l. No calf TTP  Pulmonary/Chest: Effort normal and breath sounds normal. No respiratory distress. He has no wheezes. He has no rales. He exhibits no tenderness.  Abdominal: Soft. Bowel sounds are normal. He exhibits no distension and no mass. There is no tenderness. There is no rebound and no guarding.  Musculoskeletal: He exhibits edema.  Lymphadenopathy:    He has no cervical adenopathy.  Neurological: He is alert and oriented to person, place, and time.  Skin: Skin is warm and dry. No rash noted.  Psychiatric: He has a normal mood and affect.  His behavior is normal. Thought content normal.     Labs reviewed: Appointment on 04/21/2016  Component Date Value Ref Range Status  . Cholesterol, Total 04/21/2016 148  100 - 199 mg/dL Final  . Triglycerides 04/21/2016 65  0 - 149 mg/dL Final  . HDL 04/21/2016 40  >39 mg/dL Final  . VLDL Cholesterol Cal 04/21/2016 13  5 - 40 mg/dL Final  . LDL Calculated 04/21/2016 95  0 - 99 mg/dL Final  . Chol/HDL Ratio 04/21/2016 3.7  0.0 - 5.0 ratio units Final   Comment:                                   T. Chol/HDL Ratio                                             Men  Women                               1/2 Avg.Risk  3.4    3.3                                   Avg.Risk  5.0    4.4                                2X Avg.Risk  9.6    7.1                                3X Avg.Risk 23.4   11.0   . Hgb A1c MFr Bld 04/21/2016 6.3* 4.8 - 5.6 % Final   Comment:  Pre-diabetes: 5.7 - 6.4          Diabetes: >6.4          Glycemic control for adults with diabetes: <7.0   . Est. average glucose Bld gHb Est-m* 04/21/2016 134   Final    No results found.   Assessment/Plan   ICD-9-CM ICD-10-CM   1. Mitral valve annular calcification 424.0 I05.9   2. Essential hypertension, benign 401.1 I10   3. Hyperlipidemia 272.4 E78.5   4. Bilateral edema of lower extremity 782.3 R60.0   5. Diabetes mellitus due to underlying condition, controlled, with diabetic nephropathy, without long-term current use of insulin (HCC) 249.40 E08.21    583.81    6. Malignant neoplasm of prostate ([place]) 185 C61   7. Primary osteoarthritis involving multiple joints 715.09 M15.0     Continue current medications as ordered. He declines change in statin dose at this time  Follow up with Dr Judd Lien as scheduled  Follow up with Urology next week as scheduled  Follow up in 4 mos for CPE. Fasting labs prior to appt   [place] S. Perlie Gold  [facility] [address] [place], [place] [address] [phone] Cell (Monday-Friday 8 AM - 5 PM) [phone] After 5 PM and follow prompts

## 2016-04-27 DIAGNOSIS — M1990 Unspecified osteoarthritis, unspecified site: Secondary | ICD-10-CM | POA: Diagnosis not present

## 2016-04-27 DIAGNOSIS — Z923 Personal history of irradiation: Secondary | ICD-10-CM | POA: Diagnosis not present

## 2016-04-27 DIAGNOSIS — D649 Anemia, unspecified: Secondary | ICD-10-CM | POA: Diagnosis not present

## 2016-04-27 DIAGNOSIS — K219 Gastro-esophageal reflux disease without esophagitis: Secondary | ICD-10-CM | POA: Diagnosis not present

## 2016-04-27 DIAGNOSIS — Z01818 Encounter for other preprocedural examination: Secondary | ICD-10-CM | POA: Diagnosis not present

## 2016-04-27 DIAGNOSIS — N135 Crossing vessel and stricture of ureter without hydronephrosis: Secondary | ICD-10-CM | POA: Diagnosis not present

## 2016-04-27 DIAGNOSIS — E1165 Type 2 diabetes mellitus with hyperglycemia: Secondary | ICD-10-CM | POA: Diagnosis not present

## 2016-04-27 DIAGNOSIS — Z8546 Personal history of malignant neoplasm of prostate: Secondary | ICD-10-CM | POA: Diagnosis not present

## 2016-04-27 DIAGNOSIS — N320 Bladder-neck obstruction: Secondary | ICD-10-CM | POA: Diagnosis not present

## 2016-04-27 DIAGNOSIS — R972 Elevated prostate specific antigen [PSA]: Secondary | ICD-10-CM | POA: Diagnosis not present

## 2016-04-27 DIAGNOSIS — R310 Gross hematuria: Secondary | ICD-10-CM | POA: Diagnosis not present

## 2016-04-27 DIAGNOSIS — E785 Hyperlipidemia, unspecified: Secondary | ICD-10-CM | POA: Diagnosis not present

## 2016-04-27 DIAGNOSIS — E1122 Type 2 diabetes mellitus with diabetic chronic kidney disease: Secondary | ICD-10-CM | POA: Diagnosis not present

## 2016-04-27 DIAGNOSIS — N183 Chronic kidney disease, stage 3 (moderate): Secondary | ICD-10-CM | POA: Diagnosis not present

## 2016-04-27 DIAGNOSIS — Z7984 Long term (current) use of oral hypoglycemic drugs: Secondary | ICD-10-CM | POA: Diagnosis not present

## 2016-04-27 DIAGNOSIS — C7951 Secondary malignant neoplasm of bone: Secondary | ICD-10-CM | POA: Diagnosis not present

## 2016-04-27 DIAGNOSIS — C61 Malignant neoplasm of prostate: Secondary | ICD-10-CM | POA: Diagnosis not present

## 2016-04-27 DIAGNOSIS — Z79899 Other long term (current) drug therapy: Secondary | ICD-10-CM | POA: Diagnosis not present

## 2016-04-27 DIAGNOSIS — N359 Urethral stricture, unspecified: Secondary | ICD-10-CM | POA: Diagnosis not present

## 2016-04-27 DIAGNOSIS — I129 Hypertensive chronic kidney disease with stage 1 through stage 4 chronic kidney disease, or unspecified chronic kidney disease: Secondary | ICD-10-CM | POA: Diagnosis not present

## 2016-04-27 DIAGNOSIS — N131 Hydronephrosis with ureteral stricture, not elsewhere classified: Secondary | ICD-10-CM | POA: Diagnosis not present

## 2016-04-27 DIAGNOSIS — N393 Stress incontinence (female) (male): Secondary | ICD-10-CM | POA: Diagnosis not present

## 2016-04-27 DIAGNOSIS — N4889 Other specified disorders of penis: Secondary | ICD-10-CM | POA: Diagnosis not present

## 2016-04-27 DIAGNOSIS — Z7982 Long term (current) use of aspirin: Secondary | ICD-10-CM | POA: Diagnosis not present

## 2016-04-27 DIAGNOSIS — E669 Obesity, unspecified: Secondary | ICD-10-CM | POA: Diagnosis not present

## 2016-04-27 DIAGNOSIS — Z9079 Acquired absence of other genital organ(s): Secondary | ICD-10-CM | POA: Diagnosis not present

## 2016-04-27 DIAGNOSIS — I517 Cardiomegaly: Secondary | ICD-10-CM | POA: Diagnosis not present

## 2016-04-27 DIAGNOSIS — N529 Male erectile dysfunction, unspecified: Secondary | ICD-10-CM | POA: Diagnosis not present

## 2016-04-27 DIAGNOSIS — Z466 Encounter for fitting and adjustment of urinary device: Secondary | ICD-10-CM | POA: Diagnosis not present

## 2016-05-04 ENCOUNTER — Other Ambulatory Visit: Payer: Self-pay | Admitting: Internal Medicine

## 2016-05-07 DIAGNOSIS — H35352 Cystoid macular degeneration, left eye: Secondary | ICD-10-CM | POA: Diagnosis not present

## 2016-05-07 DIAGNOSIS — H35372 Puckering of macula, left eye: Secondary | ICD-10-CM | POA: Diagnosis not present

## 2016-05-07 DIAGNOSIS — H31092 Other chorioretinal scars, left eye: Secondary | ICD-10-CM | POA: Diagnosis not present

## 2016-05-07 DIAGNOSIS — H3562 Retinal hemorrhage, left eye: Secondary | ICD-10-CM | POA: Diagnosis not present

## 2016-06-03 ENCOUNTER — Other Ambulatory Visit: Payer: Self-pay | Admitting: Internal Medicine

## 2016-06-10 DIAGNOSIS — Z5111 Encounter for antineoplastic chemotherapy: Secondary | ICD-10-CM | POA: Diagnosis not present

## 2016-06-10 DIAGNOSIS — C61 Malignant neoplasm of prostate: Secondary | ICD-10-CM | POA: Diagnosis not present

## 2016-06-29 DIAGNOSIS — I129 Hypertensive chronic kidney disease with stage 1 through stage 4 chronic kidney disease, or unspecified chronic kidney disease: Secondary | ICD-10-CM | POA: Diagnosis not present

## 2016-06-29 DIAGNOSIS — Z79899 Other long term (current) drug therapy: Secondary | ICD-10-CM | POA: Diagnosis not present

## 2016-06-29 DIAGNOSIS — Z6838 Body mass index (BMI) 38.0-38.9, adult: Secondary | ICD-10-CM | POA: Diagnosis not present

## 2016-06-29 DIAGNOSIS — E1122 Type 2 diabetes mellitus with diabetic chronic kidney disease: Secondary | ICD-10-CM | POA: Diagnosis not present

## 2016-06-29 DIAGNOSIS — C7982 Secondary malignant neoplasm of genital organs: Secondary | ICD-10-CM | POA: Diagnosis not present

## 2016-06-29 DIAGNOSIS — Z888 Allergy status to other drugs, medicaments and biological substances status: Secondary | ICD-10-CM | POA: Diagnosis not present

## 2016-06-29 DIAGNOSIS — K219 Gastro-esophageal reflux disease without esophagitis: Secondary | ICD-10-CM | POA: Diagnosis not present

## 2016-06-29 DIAGNOSIS — Z7984 Long term (current) use of oral hypoglycemic drugs: Secondary | ICD-10-CM | POA: Diagnosis not present

## 2016-06-29 DIAGNOSIS — E785 Hyperlipidemia, unspecified: Secondary | ICD-10-CM | POA: Diagnosis not present

## 2016-06-29 DIAGNOSIS — Z87891 Personal history of nicotine dependence: Secondary | ICD-10-CM | POA: Diagnosis not present

## 2016-06-29 DIAGNOSIS — N131 Hydronephrosis with ureteral stricture, not elsewhere classified: Secondary | ICD-10-CM | POA: Diagnosis not present

## 2016-06-29 DIAGNOSIS — M1990 Unspecified osteoarthritis, unspecified site: Secondary | ICD-10-CM | POA: Diagnosis not present

## 2016-06-29 DIAGNOSIS — Z466 Encounter for fitting and adjustment of urinary device: Secondary | ICD-10-CM | POA: Diagnosis not present

## 2016-06-29 DIAGNOSIS — E669 Obesity, unspecified: Secondary | ICD-10-CM | POA: Diagnosis not present

## 2016-06-29 DIAGNOSIS — Z8546 Personal history of malignant neoplasm of prostate: Secondary | ICD-10-CM | POA: Diagnosis not present

## 2016-06-29 DIAGNOSIS — N135 Crossing vessel and stricture of ureter without hydronephrosis: Secondary | ICD-10-CM | POA: Diagnosis not present

## 2016-06-29 DIAGNOSIS — N320 Bladder-neck obstruction: Secondary | ICD-10-CM | POA: Diagnosis not present

## 2016-06-29 DIAGNOSIS — N183 Chronic kidney disease, stage 3 (moderate): Secondary | ICD-10-CM | POA: Diagnosis not present

## 2016-06-29 DIAGNOSIS — Z7982 Long term (current) use of aspirin: Secondary | ICD-10-CM | POA: Diagnosis not present

## 2016-06-30 DIAGNOSIS — H3562 Retinal hemorrhage, left eye: Secondary | ICD-10-CM | POA: Diagnosis not present

## 2016-06-30 DIAGNOSIS — H31092 Other chorioretinal scars, left eye: Secondary | ICD-10-CM | POA: Diagnosis not present

## 2016-06-30 DIAGNOSIS — H53142 Visual discomfort, left eye: Secondary | ICD-10-CM | POA: Diagnosis not present

## 2016-06-30 DIAGNOSIS — H35372 Puckering of macula, left eye: Secondary | ICD-10-CM | POA: Diagnosis not present

## 2016-06-30 DIAGNOSIS — H35352 Cystoid macular degeneration, left eye: Secondary | ICD-10-CM | POA: Diagnosis not present

## 2016-07-06 ENCOUNTER — Other Ambulatory Visit: Payer: Self-pay | Admitting: Internal Medicine

## 2016-07-06 DIAGNOSIS — Z4789 Encounter for other orthopedic aftercare: Secondary | ICD-10-CM | POA: Diagnosis not present

## 2016-07-06 DIAGNOSIS — M84464S Pathological fracture, left fibula, sequela: Secondary | ICD-10-CM | POA: Diagnosis not present

## 2016-07-06 DIAGNOSIS — M8580 Other specified disorders of bone density and structure, unspecified site: Secondary | ICD-10-CM | POA: Diagnosis not present

## 2016-07-06 DIAGNOSIS — M84469S Pathological fracture, unspecified tibia and fibula, sequela: Secondary | ICD-10-CM | POA: Diagnosis not present

## 2016-07-06 DIAGNOSIS — C7951 Secondary malignant neoplasm of bone: Secondary | ICD-10-CM | POA: Diagnosis not present

## 2016-07-06 DIAGNOSIS — M84469D Pathological fracture, unspecified tibia and fibula, subsequent encounter for fracture with routine healing: Secondary | ICD-10-CM | POA: Diagnosis not present

## 2016-07-06 DIAGNOSIS — M84462S Pathological fracture, left tibia, sequela: Secondary | ICD-10-CM | POA: Diagnosis not present

## 2016-07-07 NOTE — Addendum Note (Signed)
Addended by: Rafael Bihari A on: 07/07/2016 12:43 PM   Modules accepted: Orders

## 2016-08-16 DIAGNOSIS — E119 Type 2 diabetes mellitus without complications: Secondary | ICD-10-CM | POA: Diagnosis not present

## 2016-08-19 DIAGNOSIS — Z7984 Long term (current) use of oral hypoglycemic drugs: Secondary | ICD-10-CM | POA: Diagnosis not present

## 2016-08-19 DIAGNOSIS — N183 Chronic kidney disease, stage 3 (moderate): Secondary | ICD-10-CM | POA: Diagnosis not present

## 2016-08-19 DIAGNOSIS — Z9079 Acquired absence of other genital organ(s): Secondary | ICD-10-CM | POA: Diagnosis not present

## 2016-08-19 DIAGNOSIS — Z923 Personal history of irradiation: Secondary | ICD-10-CM | POA: Diagnosis not present

## 2016-08-19 DIAGNOSIS — Z888 Allergy status to other drugs, medicaments and biological substances status: Secondary | ICD-10-CM | POA: Diagnosis not present

## 2016-08-19 DIAGNOSIS — D631 Anemia in chronic kidney disease: Secondary | ICD-10-CM | POA: Diagnosis not present

## 2016-08-19 DIAGNOSIS — C61 Malignant neoplasm of prostate: Secondary | ICD-10-CM | POA: Diagnosis not present

## 2016-08-19 DIAGNOSIS — Z7982 Long term (current) use of aspirin: Secondary | ICD-10-CM | POA: Diagnosis not present

## 2016-08-19 DIAGNOSIS — Z23 Encounter for immunization: Secondary | ICD-10-CM | POA: Diagnosis not present

## 2016-08-19 DIAGNOSIS — Z79899 Other long term (current) drug therapy: Secondary | ICD-10-CM | POA: Diagnosis not present

## 2016-08-19 DIAGNOSIS — I129 Hypertensive chronic kidney disease with stage 1 through stage 4 chronic kidney disease, or unspecified chronic kidney disease: Secondary | ICD-10-CM | POA: Diagnosis not present

## 2016-08-19 DIAGNOSIS — E1122 Type 2 diabetes mellitus with diabetic chronic kidney disease: Secondary | ICD-10-CM | POA: Diagnosis not present

## 2016-08-19 DIAGNOSIS — C7951 Secondary malignant neoplasm of bone: Secondary | ICD-10-CM | POA: Diagnosis not present

## 2016-09-01 ENCOUNTER — Other Ambulatory Visit: Payer: Medicare Other

## 2016-09-01 DIAGNOSIS — I10 Essential (primary) hypertension: Secondary | ICD-10-CM | POA: Diagnosis not present

## 2016-09-01 DIAGNOSIS — E785 Hyperlipidemia, unspecified: Secondary | ICD-10-CM | POA: Diagnosis not present

## 2016-09-01 DIAGNOSIS — E0821 Diabetes mellitus due to underlying condition with diabetic nephropathy: Secondary | ICD-10-CM

## 2016-09-01 DIAGNOSIS — R600 Localized edema: Secondary | ICD-10-CM | POA: Diagnosis not present

## 2016-09-01 DIAGNOSIS — C61 Malignant neoplasm of prostate: Secondary | ICD-10-CM

## 2016-09-02 ENCOUNTER — Other Ambulatory Visit: Payer: Self-pay | Admitting: Internal Medicine

## 2016-09-02 LAB — LIPID PANEL
CHOL/HDL RATIO: 3.4 ratio (ref <=5.0)
CHOLESTEROL: 155 mg/dL (ref 125–200)
HDL: 46 mg/dL (ref >=40)
LDL CALC: 93 mg/dL (ref <130)
TRIGLYCERIDES: 82 mg/dL (ref <150)
VLDL: 16 mg/dL (ref <30)

## 2016-09-02 LAB — COMPREHENSIVE METABOLIC PANEL
ALBUMIN: 3.3 g/dL — AB (ref 3.6–5.1)
ALT: 9 U/L (ref 9–46)
AST: 11 U/L (ref 10–35)
Alkaline Phosphatase: 77 U/L (ref 40–115)
BUN: 22 mg/dL (ref 7–25)
CHLORIDE: 107 mmol/L (ref 98–110)
CO2: 22 mmol/L (ref 20–31)
CREATININE: 1.16 mg/dL (ref 0.70–1.18)
Calcium: 8.7 mg/dL (ref 8.6–10.3)
Glucose, Bld: 130 mg/dL — ABNORMAL HIGH (ref 65–99)
POTASSIUM: 4.2 mmol/L (ref 3.5–5.3)
Sodium: 141 mmol/L (ref 135–146)
TOTAL PROTEIN: 5.9 g/dL — AB (ref 6.1–8.1)
Total Bilirubin: 0.4 mg/dL (ref 0.2–1.2)

## 2016-09-02 LAB — CBC WITH DIFFERENTIAL/PLATELET
BASOS ABS: 0 {cells}/uL (ref 0–200)
Basophils Relative: 0 %
EOS ABS: 222 {cells}/uL (ref 15–500)
EOS PCT: 3 %
HEMATOCRIT: 36.2 % — AB (ref 38.5–50.0)
Hemoglobin: 11.9 g/dL — ABNORMAL LOW (ref 13.2–17.1)
LYMPHS ABS: 2146 {cells}/uL (ref 850–3900)
Lymphocytes Relative: 29 %
MCH: 26.9 pg — AB (ref 27.0–33.0)
MCHC: 32.9 g/dL (ref 32.0–36.0)
MCV: 81.9 fL (ref 80.0–100.0)
MONO ABS: 444 {cells}/uL (ref 200–950)
MPV: 9.3 fL (ref 7.5–12.5)
Monocytes Relative: 6 %
NEUTROS ABS: 4588 {cells}/uL (ref 1500–7800)
NEUTROS PCT: 62 %
Platelets: 194 10*3/uL (ref 140–400)
RBC: 4.42 MIL/uL (ref 4.20–5.80)
RDW: 15.8 % — ABNORMAL HIGH (ref 11.0–15.0)
WBC: 7.4 10*3/uL (ref 3.8–10.8)

## 2016-09-02 LAB — HEMOGLOBIN A1C
Hgb A1c MFr Bld: 6 % — ABNORMAL HIGH (ref <5.7)
MEAN PLASMA GLUCOSE: 126 mg/dL

## 2016-09-02 LAB — TSH: TSH: 1.26 mIU/L (ref 0.40–4.50)

## 2016-09-03 ENCOUNTER — Ambulatory Visit: Payer: Self-pay

## 2016-09-03 ENCOUNTER — Encounter: Payer: Self-pay | Admitting: Internal Medicine

## 2016-09-03 ENCOUNTER — Ambulatory Visit (INDEPENDENT_AMBULATORY_CARE_PROVIDER_SITE_OTHER): Payer: Medicare Other

## 2016-09-03 ENCOUNTER — Ambulatory Visit (INDEPENDENT_AMBULATORY_CARE_PROVIDER_SITE_OTHER): Payer: Medicare Other | Admitting: Internal Medicine

## 2016-09-03 VITALS — BP 152/80 | HR 62 | Temp 97.8°F | Ht 71.0 in | Wt 274.0 lb

## 2016-09-03 DIAGNOSIS — I3481 Nonrheumatic mitral (valve) annulus calcification: Secondary | ICD-10-CM

## 2016-09-03 DIAGNOSIS — Z0000 Encounter for general adult medical examination without abnormal findings: Secondary | ICD-10-CM | POA: Diagnosis not present

## 2016-09-03 DIAGNOSIS — E782 Mixed hyperlipidemia: Secondary | ICD-10-CM

## 2016-09-03 DIAGNOSIS — C61 Malignant neoplasm of prostate: Secondary | ICD-10-CM | POA: Diagnosis not present

## 2016-09-03 DIAGNOSIS — J439 Emphysema, unspecified: Secondary | ICD-10-CM | POA: Diagnosis not present

## 2016-09-03 DIAGNOSIS — I10 Essential (primary) hypertension: Secondary | ICD-10-CM | POA: Diagnosis not present

## 2016-09-03 DIAGNOSIS — M150 Primary generalized (osteo)arthritis: Secondary | ICD-10-CM | POA: Diagnosis not present

## 2016-09-03 DIAGNOSIS — R600 Localized edema: Secondary | ICD-10-CM | POA: Diagnosis not present

## 2016-09-03 DIAGNOSIS — M159 Polyosteoarthritis, unspecified: Secondary | ICD-10-CM

## 2016-09-03 DIAGNOSIS — E0821 Diabetes mellitus due to underlying condition with diabetic nephropathy: Secondary | ICD-10-CM

## 2016-09-03 DIAGNOSIS — I059 Rheumatic mitral valve disease, unspecified: Secondary | ICD-10-CM

## 2016-09-03 NOTE — Patient Instructions (Addendum)
Encouraged him to exercise 30-45 minutes 4-5 times per week. Eat a well balanced diet. Avoid smoking. Limit alcohol intake. Wear seatbelt when riding in the car. Wear sun block (SPF >50) when spending extended times outside.  Get eye exam report from Dr Delman Cheadle faxed to office  Continue curent medications as ordered  Follow up with specialists as scheduled  Follow up in 4 mos for routine visit. Fasting labs prior to appt

## 2016-09-03 NOTE — Progress Notes (Signed)
Patient ID: Jeremiah Collins, male   DOB: [birth year], 82 y.o.   MRN: [id number]   Location:  [facility]  Place of Service:  OFFICE  Provider: [name], DO  Patient Care Team: [name], DO as PCP - General (Internal Medicine) Myrlene Broker, MD as Attending Physician (Urology) [name], MD as Consulting Physician (Ophthalmology) [name], MD as Consulting Physician (Ophthalmology)  Extended Emergency Contact Information Primary Emergency Contact: Ratto,Brenda Address: [id number]          [place], [place] [address] [place] Phone: [phone] Relation: Spouse  Code Status: FULL CODE Goals of Care: Advanced Directive information Advanced Directives 09/03/2016  Does patient have an advance directive? No  Would patient like information on creating an advanced directive? No - patient declined information     Chief Complaint  Patient presents with  . Medicare Wellness    Extended visit    HPI: Patient is a 82 y.o. male seen in today for an annual wellness exam.  He has no concerns today  OD Hollenhorst plaque - he had sx x 2 as the 1st procedure was ineffective. Vision improved after 2nd sx. He is followed by Dr Judd Lien (ophthamology)  mitral annular calcification - 2 D echo revealed mild AS and calcified mass on posterior leaflet of mitral valve with severe LVH. nml heart fxn with nml EF. He has SOB. No CP or dizziness. No leg swelling. He saw cardio and determined to have mitral annular calcification but no sx recommended at this time. Cardio recommended increase in statin tx but pt declines  hx prostate CA and rec'd XRT - He is currently on leuprolide injection q19mos. He also takes cardura. He was started on Xtandi in mid October 2017 due to increasing PSA level. He went for surveillance CT chest/abd/pelvis and whole body bone scan recently at [facility]. No changes compared to previous studies. Followed by urology Dr Rosana Hoes and  oncology Dr Marcello Moores. LDH 137; PSA 3.26 (08/19/16). He is scheduled for cystoscopy with Dr Rosana Hoes next month  DM - BS 120s usually. He checks once weekly. No low BS reactions. No numbness or tingling. He takes metformin. A1c 6.0%. Cr 1.16. Last eye exam in 07/2016. No diabetic changes. Followed by Dr Delman Cheadle  HTN - BP stable on lisinopril-HCT and metoprolol. Takes ASA daily  Hyperlipidemia - stable on statin. Occasional muscle weakness. LDL 93  Multiple joint pain - mostly in back. He does not take any meds  Hx anemia - Hgb 11.9. Ferritin 30  CKD - stage 2. Cr 1.16  Depression screen [facility] 2/9 09/03/2016 04/23/2016 03/08/2016 02/20/2016 08/20/2015  Decreased Interest 0 0 0 1 0  Down, Depressed, Hopeless 0 0 0 0 0  PHQ - 2 Score 0 0 0 1 0    Fall Risk  09/03/2016 04/23/2016 03/08/2016 02/20/2016 12/24/2015  Falls in the past year? No No No No No   MMSE - Mini Mental State Exam 09/03/2016 09/10/2014  Orientation to time 5 5  Orientation to Place 5 5  Registration 3 3  Attention/ Calculation 5 5  Recall 3 3  Language- name 2 objects 2 2  Language- repeat 1 1  Language- follow 3 step command 3 3  Language- read & follow direction 1 1  Write a sentence 0 1  Copy design 1 1  Total score 29 30     Health Maintenance  Topic Date Due  . FOOT EXAM  04/15/2016  .  ZOSTAVAX  08/24/2017 (Originally 09/15/1996)  . HEMOGLOBIN A1C  03/02/2017  . OPHTHALMOLOGY EXAM  07/28/2017  . TETANUS/TDAP  11/08/2023  . INFLUENZA VACCINE  Addressed  . PNA vac Low Risk Adult  Completed    Urinary incontinence? yes  Functional Status Survey:  completed at [facility]  Exercise? yes  Diet? Discussed at [facility]  No exam data present  Hearing: no concerns    Dentition - followed by dentist q74mos  Pain - stable overall  Past Medical History:  Diagnosis Date  . Acute bronchitis 09/12/2012  . 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 Bone metastases ([place])    left mid tibial shaft  . Cataract    Dr.Groat  . Cellulitis and  abscess of leg, except foot 01/17/2012  . Chest pain, unspecified 04/28/2004  . Closed fracture of five ribs 03/24/2004  . First degree atrioventricular block 04/25/2007  . Gross hematuria 09/06/2011  . History of radiation therapy 04/03/14- 04/17/14   mid to distal left tibia 3000 cGy in 10 sessions  . HTN (hypertension)   . Hx of radiation therapy 11/1998   prostate fossa - 6040 cGy, 33 fx, Dr Danny Lawless  . Osteoarthrosis involving, or with mention of more than one site, but not specified as generalized, multiple sites 10/22/2010  . Other abnormal blood chemistry 04/29/1991  . Other and unspecified hyperlipidemia 01/02/2013  . Palpitations 04/28/2004  . Prostate cancer ([place]) 12/16/1994   gleason 7  . Reflux esophagitis 05/10/2008  . Routine general medical examination at a health care facility   . Spinal stenosis, unspecified region other than cervical 04/29/1995  . Type II or unspecified type diabetes mellitus without mention of complication, uncontrolled     Past Surgical History:  Procedure Laterality Date  . AIR/FLUID EXCHANGE Left 01/09/2016   Procedure: AIR/FLUID EXCHANGE;  Surgeon: Jalene Mullet, MD;  Location: [place];  Service: Ophthalmology;  Laterality: Left;  . CYSTOSCOPY W/ URETERAL STENT PLACEMENT  12/31/14  . CYSTOSCOPY W/ URETERAL STENT PLACEMENT  05/16/2015  . EYE SURGERY  2006   cataract, Dr Shanon Rosser  . LASER PHOTO ABLATION Left 01/09/2016   Procedure: LASER PHOTO ABLATION;  Surgeon: Jalene Mullet, MD;  Location: [place];  Service: Ophthalmology;  Laterality: Left;  Endolaser  . Left ureteral stent placement  Week of 12/05/11  . PARS PLANA VITRECTOMY Left 01/09/2016   Procedure: PARS PLANA VITRECTOMY WITH 25 GAUGE LEFT EYE ;  Surgeon: Jalene Mullet, MD;  Location: [place];  Service: Ophthalmology;  Laterality: Left;  . PERFLUORONE INJECTION Left 01/09/2016   Procedure: PERFLUORONE INJECTION;  Surgeon: Jalene Mullet, MD;  Location: [place];  Service: Ophthalmology;  Laterality: Left;   . PROSTATECTOMY  1996   Dr. Rosana Hoes    Family History  Problem Relation Age of Onset  . Heart disease Father   . Stroke Sister    Family Status  Relation Status  . Father Deceased  . Mother Deceased  . Sister Deceased  . Sister Deceased  . Sister Deceased    Social History   Social History  . Marital status: Married    Spouse name: N/A  . Number of children: N/A  . Years of education: N/A   Occupational History  . Not on file.   Social History Main Topics  . Smoking status: Former Smoker    Types: Cigars    Quit date: 12/16/1968  . Smokeless tobacco: Never Used  . Alcohol use No  . Drug use: No  . Sexual activity: No   Other Topics  Concern  . Not on file   Social History Narrative  . No narrative on file    Allergies  Allergen Reactions  . Abiraterone Other (See Comments) and Nausea And Vomiting    Other reaction(s): Increased Heart Rate (intolerance) Other reaction(s): Increased Heart Rate (intolerance) Other reaction(s): Increased Heart Rate (intolerance)      Medication List       Accurate as of 09/03/16 10:13 AM. Always use your most recent med list.          aspirin 81 MG tablet Take 1 tablet (81 mg total) by mouth daily.   Cholecalciferol 1000 units tablet Take 1,000 Units by mouth daily.   doxazosin 4 MG tablet Commonly known as:  CARDURA TAKE ONE TABLET BY MOUTH ONCE DAILY   leuprolide acetate (6 Month) 45 MG injection Generic drug:  leuprolide (6 Month) Inject 45 mg into the skin every 6 (six) months. Filled by [facility]   lisinopril-hydrochlorothiazide 20-12.5 MG tablet Commonly known as:  PRINZIDE,ZESTORETIC TAKE ONE TABLET BY MOUTH DAILY   metFORMIN 500 MG tablet Commonly known as:  GLUCOPHAGE TAKE ONE TABLET BY MOUTH ONCE DAILY WITH BREAKFAST   metoprolol 50 MG tablet Commonly known as:  LOPRESSOR Take 1 tablet (50 mg total) by mouth daily.   simvastatin 5 MG tablet Commonly known as:  ZOCOR TAKE ONE TABLET BY  MOUTH ONCE DAILY   ULTRAM 50 MG tablet Generic drug:  traMADol Take 50 mg by mouth.   XTANDI 40 MG capsule Generic drug:  enzalutamide Take 40 mg by mouth daily. Take 4 per day        Review of Systems:  Review of Systems  Genitourinary: Positive for difficulty urinating.  Musculoskeletal: Positive for arthralgias.  Skin: Positive for color change (left anterior leg due to XRT).  Neurological: Negative for numbness.  All other systems reviewed and are negative.   Physical Exam: Vitals:   09/03/16 0931  BP: (!) 152/80  Pulse: 62  Temp: 97.8 F (36.6 C)  TempSrc: Oral  SpO2: 95%  Weight: 274 lb (124.3 kg)  Height: 5\' 11"  (1.803 m)   Body mass index is 38.22 kg/m. Physical Exam  Constitutional: He is oriented to person, place, and time. He appears well-developed and well-nourished. No distress.  HENT:  Head: Normocephalic and atraumatic.  Right Ear: Hearing, tympanic membrane, external ear and ear canal normal.  Left Ear: Hearing, tympanic membrane, external ear and ear canal normal.  Mouth/Throat: Uvula is midline, oropharynx is clear and moist and mucous membranes are normal. He does not have dentures. No oropharyngeal exudate.  Eyes: Conjunctivae, EOM and lids are normal. Pupils are equal, round, and reactive to light. No scleral icterus.  Neck: Trachea normal and normal range of motion. Neck supple. Carotid bruit is not present. No thyroid mass and no thyromegaly present.  Cardiovascular: Normal rate, regular rhythm and intact distal pulses.  Exam reveals no gallop, no distant heart sounds and no friction rub.   Murmur heard.  Systolic murmur is present with a grade of 2/6  +1 pitting LE edema b/l. No calf TTP  Pulmonary/Chest: Effort normal and breath sounds normal. No respiratory distress. He has no wheezes. He has no rhonchi. He has no rales. He exhibits no tenderness. Right breast exhibits no inverted nipple, no mass, no nipple discharge, no skin change and no  tenderness. Left breast exhibits no inverted nipple, no mass, no nipple discharge, no skin change and no tenderness. Breasts are symmetrical.  Abdominal: Soft.  Normal appearance, normal aorta and bowel sounds are normal. He exhibits no distension, no pulsatile midline mass and no mass. There is no hepatosplenomegaly. There is no tenderness. There is no rigidity, no rebound and no guarding. No hernia.  obese  Genitourinary:  Genitourinary Comments: Followed by urology  Musculoskeletal: Normal range of motion. He exhibits edema.  Lymphadenopathy:       Head (right side): No posterior auricular adenopathy present.       Head (left side): No posterior auricular adenopathy present.    He has no cervical adenopathy.       Right: No supraclavicular adenopathy present.       Left: No supraclavicular adenopathy present.  Neurological: He is alert and oriented to person, place, and time. He has normal strength and normal reflexes. No cranial nerve deficit. Gait normal.  Skin: Skin is warm, dry and intact. No rash noted. Nails show no clubbing.  Psychiatric: He has a normal mood and affect. His speech is normal and behavior is normal. Thought content normal. Cognition and memory are normal.   Diabetic Foot Exam - Simple   Simple Foot Form Visual Inspection See comments:  Yes Sensation Testing See comments:  Yes Pulse Check Posterior Tibialis and Dorsalis pulse intact bilaterally:  Yes Comments B/l thick dystrophic appearing toenails; chronic LE venous stasis changes; reduced monofilament testing b/l; no calluses/ulcerations     Labs reviewed:  Basic Metabolic Panel:  Recent Labs  01/09/16 1628 09/01/16 0803  NA 141 141  K 3.7 4.2  CL 104 107  CO2  --  22  GLUCOSE 106* 130*  BUN 22* 22  CREATININE 1.20 1.16  CALCIUM  --  8.7  TSH  --  1.26   Liver Function Tests:  Recent Labs  09/01/16 0803  AST 11  ALT 9  ALKPHOS 77  BILITOT 0.4  PROT 5.9*  ALBUMIN 3.3*   No results  for input(s): LIPASE, AMYLASE in the last 8760 hours. No results for input(s): AMMONIA in the last 8760 hours. CBC:  Recent Labs  01/09/16 1628 09/01/16 0803  WBC  --  7.4  NEUTROABS  --  4,588  HGB 11.9* 11.9*  HCT 35.0* 36.2*  MCV  --  81.9  PLT  --  194   Lipid Panel:  Recent Labs  04/21/16 0804 09/01/16 0803  CHOL 148 155  HDL 40 46  LDLCALC 95 93  TRIG 65 82  CHOLHDL 3.7 3.4   Lab Results  Component Value Date   HGBA1C 6.0 (H) 09/01/2016    Procedures: No results found.  Assessment/Plan   ICD-9-CM ICD-10-CM   1. Well adult exam V70.0 Z00.00   2. Essential hypertension, benign 401.1 I10   3. Diabetes mellitus due to underlying condition, controlled, with diabetic nephropathy, without long-term current use of insulin (HCC) 249.40 E08.21 Hemoglobin A1C   583.81    4. Mixed hyperlipidemia 272.2 E78.2 Lipid panel  5. Bilateral edema of lower extremity 782.3 R60.0   6. Malignant neoplasm of prostate ([place]) 185 C61   7. Mitral valve annular calcification 424.0 I05.9   8. Primary osteoarthritis involving multiple joints 715.09 M15.0   9. Pulmonary emphysema, unspecified emphysema type ([place]) 492.8 J43.9     Pt is UTD on health maintenance. Vaccinations are UTD. Pt maintains a healthy lifestyle. Encouraged pt to exercise 30-45 minutes 4-5 times per week. Eat a well balanced diet. Avoid smoking. Limit alcohol intake. Wear seatbelt when riding in the car. Wear sun block (SPF >50)  when spending extended times outside.   Get eye exam report from Dr Delman Cheadle faxed to office  Continue curent medications as ordered  Follow up with specialists as scheduled  Follow up in 4 mos for routine visit. Fasting labs prior to appt    Jeremiah Collins  [facility] [address] [place], [place] [address] [phone] Cell (Monday-Friday 8 AM - 5 PM) [phone] After 5 PM and follow prompts

## 2016-09-03 NOTE — Progress Notes (Signed)
PCP notes:   Health maintenance:   Due for Shingles vaccine  Abnormal screenings:   None  Patient concerns:  None  Nurse concerns: None

## 2016-09-03 NOTE — Progress Notes (Signed)
Subjective:   Jeremiah Collins is a 82 y.o. male who presents for an Initial Medicare Annual Wellness Visit.  Review of Systems  Cardiac Risk Factors include: advanced age (>65men, >37 women);male gender;hypertension;diabetes mellitus;obesity (BMI >30kg/m2)    Objective:    Today's Vitals   09/03/16 0845  BP: (!) 152/80  Pulse: 62  Temp: 97.8 F (36.6 C)  TempSrc: Oral  SpO2: 97%  Weight: 274 lb (124.3 kg)  Height: 5\' 11"  (1.803 m)  PainSc: 2    Body mass index is 38.22 kg/m.  Current Medications (verified) Outpatient Encounter Prescriptions as of 09/03/2016  Medication Sig  . aspirin 81 MG tablet Take 1 tablet (81 mg total) by mouth daily.  . Cholecalciferol 1000 UNITS tablet Take 1,000 Units by mouth daily.  Marland Kitchen doxazosin (CARDURA) 4 MG tablet TAKE ONE TABLET BY MOUTH ONCE DAILY  . enzalutamide (XTANDI) 40 MG capsule Take 40 mg by mouth daily. Take 4 per day  . leuprolide, 6 Month, (LEUPROLIDE ACETATE, 6 MONTH,) 45 MG injection Inject 45 mg into the skin every 6 (six) months. Filled by [facility]  . lisinopril-hydrochlorothiazide (PRINZIDE,ZESTORETIC) 20-12.5 MG tablet TAKE ONE TABLET BY MOUTH DAILY  . metFORMIN (GLUCOPHAGE) 500 MG tablet TAKE ONE TABLET BY MOUTH ONCE DAILY WITH BREAKFAST  . metoprolol tartrate (LOPRESSOR) 50 MG tablet Take 1 tablet (50 mg total) by mouth daily.  . simvastatin (ZOCOR) 5 MG tablet TAKE ONE TABLET BY MOUTH ONCE DAILY  . traMADol (ULTRAM) 50 MG tablet Take 50 mg by mouth.   No facility-administered encounter medications on file as of 09/03/2016.     Allergies (verified) Abiraterone   History: Past Medical History:  Diagnosis Date  . Acute bronchitis 09/12/2012  . 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 Bone metastases ([place])    left mid tibial shaft  . Cataract    Dr.Groat  . Cellulitis and abscess of leg, except foot 01/17/2012  . Chest pain, unspecified 04/28/2004  . Closed fracture of five ribs 03/24/2004  . First degree  atrioventricular block 04/25/2007  . Gross hematuria 09/06/2011  . History of radiation therapy 04/03/14- 04/17/14   mid to distal left tibia 3000 cGy in 10 sessions  . HTN (hypertension)   . Hx of radiation therapy 11/1998   prostate fossa - 6040 cGy, 33 fx, Dr Danny Lawless  . Osteoarthrosis involving, or with mention of more than one site, but not specified as generalized, multiple sites 10/22/2010  . Other abnormal blood chemistry 04/29/1991  . Other and unspecified hyperlipidemia 01/02/2013  . Palpitations 04/28/2004  . Prostate cancer ([place]) 12/16/1994   gleason 7  . Reflux esophagitis 05/10/2008  . Routine general medical examination at a health care facility   . Spinal stenosis, unspecified region other than cervical 04/29/1995  . Type II or unspecified type diabetes mellitus without mention of complication, uncontrolled    Past Surgical History:  Procedure Laterality Date  . AIR/FLUID EXCHANGE Left 01/09/2016   Procedure: AIR/FLUID EXCHANGE;  Surgeon: Jalene Mullet, MD;  Location: [place];  Service: Ophthalmology;  Laterality: Left;  . CYSTOSCOPY W/ URETERAL STENT PLACEMENT  12/31/14  . CYSTOSCOPY W/ URETERAL STENT PLACEMENT  05/16/2015  . EYE SURGERY  2006   cataract, Dr Shanon Rosser  . LASER PHOTO ABLATION Left 01/09/2016   Procedure: LASER PHOTO ABLATION;  Surgeon: Jalene Mullet, MD;  Location: [place];  Service: Ophthalmology;  Laterality: Left;  Endolaser  . Left ureteral stent placement  Week of 12/05/11  . PARS PLANA VITRECTOMY Left 01/09/2016  Procedure: PARS PLANA VITRECTOMY WITH 25 GAUGE LEFT EYE ;  Surgeon: Jalene Mullet, MD;  Location: [place];  Service: Ophthalmology;  Laterality: Left;  . PERFLUORONE INJECTION Left 01/09/2016   Procedure: PERFLUORONE INJECTION;  Surgeon: Jalene Mullet, MD;  Location: [place];  Service: Ophthalmology;  Laterality: Left;  . PROSTATECTOMY  1996   Dr. Rosana Hoes   Family History  Problem Relation Age of Onset  . Heart disease Father   . Stroke Sister     Social History   Occupational History  . Not on file.   Social History Main Topics  . Smoking status: Former Smoker    Types: Cigars    Quit date: 12/16/1968  . Smokeless tobacco: Never Used  . Alcohol use No  . Drug use: No  . Sexual activity: No   Tobacco Counseling Counseling given: No   Activities of Daily Living In your present state of health, do you have any difficulty performing the following activities: 09/03/2016 01/09/2016  Hearing? N N  Vision? N Y  Difficulty concentrating or making decisions? N N  Walking or climbing stairs? Y Y  Dressing or bathing? N N  Doing errands, shopping? N -  Preparing Food and eating ? N -  Using the Toilet? N -  In the past six months, have you accidently leaked urine? Y -  Do you have problems with loss of bowel control? Y -  Managing your Medications? N -  Managing your Finances? N -  Housekeeping or managing your Housekeeping? N -  Some recent data might be hidden    Immunizations and Health Maintenance Immunization History  Administered Date(s) Administered  . Influenza Whole 08/13/2010, 08/16/2011  . Influenza,inj,Quad PF,36+ Mos 07/18/2013, 08/20/2015  . Pneumococcal Conjugate-13 01/14/2015  . Pneumococcal Polysaccharide-23 09/06/2011  . Tdap 11/07/2013   Health Maintenance Due  Topic Date Due  . ZOSTAVAX  09/15/1996  . FOOT EXAM  04/15/2016    Patient Care Team: [name], DO as PCP - General (Internal Medicine) Myrlene Broker, MD as Attending Physician (Urology) [name], MD as Consulting Physician (Ophthalmology) [name], MD as Consulting Physician (Ophthalmology)  Indicate any recent Medical Services you may have received from other than [facility] providers in the past year (date may be approximate).    Assessment:   This is a routine wellness examination for Jeremiah Collins.  Hearing/Vision screen Hearing Screening Comments: Patient unsure of last hearing exam Vision Screening Comments: Last eye  exam, September 2017 w/ Dr. Delman Cheadle.  Dietary issues and exercise activities discussed: Current Exercise Habits: The patient has a physically strenous job, but has no regular exercise apart from work. 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 Increase water intake          Starting 09/03/16, I will attempt to increase my water intake by 2 cups.       Depression Screen PHQ 2/9 Scores 09/03/2016 04/23/2016 03/08/2016 02/20/2016  PHQ - 2 Score 0 0 0 1    Fall Risk Fall Risk  09/03/2016 04/23/2016 03/08/2016 02/20/2016 12/24/2015  Falls in the past year? No No No No No    Cognitive Function: MMSE - Mini Mental State Exam 09/03/2016 09/10/2014  Orientation to time 5 5  Orientation to Place 5 5  Registration 3 3  Attention/ Calculation 5 5  Recall 3 3  Language- name 2 objects 2 2  Language- repeat 1 1  Language- follow 3 step command 3 3  Language- read & follow direction 1 1  Write a sentence 1 1  Copy design 1 1  Total score 30 30        Screening Tests Health Maintenance  Topic Date Due  . ZOSTAVAX  09/15/1996  . FOOT EXAM  04/15/2016  . HEMOGLOBIN A1C  03/02/2017  . OPHTHALMOLOGY EXAM  07/28/2017  . TETANUS/TDAP  11/08/2023  . INFLUENZA VACCINE  Addressed  . PNA vac Low Risk Adult  Completed        Plan:    I have personally reviewed and addressed the Medicare Annual Wellness questionnaire and have noted the following in the patient's chart:  A. Medical and social history B. Use of alcohol, tobacco or illicit drugs  C. Current medications and supplements D. Functional ability and status E.  Nutritional status F.  Physical activity G. Advance directives H. List of other physicians I.  Hospitalizations, surgeries, and ER visits in previous 12 months J.  [place] to include hearing, vision, cognitive, depression L. Referrals and appointments - none  In addition, I have reviewed and discussed  with patient certain preventive protocols, quality metrics, and best practice recommendations. A written personalized care plan for preventive services as well as general preventive health recommendations were provided to patient.  See attached scanned questionnaire for additional information.   Signed,   Allyn Kenner, LPN Health Advisor    I have reviewed the health advisor's note and was available for consultation. I agree with documentation and plan.   Sherril Shipman S. Perlie Gold  [facility] [address] [place], [place] [address] [phone] Cell (Monday-Friday 8 AM - 5 PM) [phone] After 5 PM and follow prompts

## 2016-09-03 NOTE — Progress Notes (Signed)
.  awv

## 2016-09-03 NOTE — Patient Instructions (Signed)
Jeremiah Collins , Thank you for taking time to come for your Medicare Wellness Visit. I appreciate your ongoing commitment to your health goals. Please review the following plan we discussed and let me know if I can assist you in the future.   These are the goals we discussed: Goals    None      This is a list of the screening recommended for you and due dates:  Health Maintenance  Topic Date Due  . Shingles Vaccine  09/15/1996  . Eye exam for diabetics  06/04/2015  . Complete foot exam   04/15/2016  . Flu Shot  06/15/2016  . Hemoglobin A1C  03/02/2017  . Tetanus Vaccine  11/08/2023  . Pneumonia vaccines  Completed  Preventive Care for Adults  A healthy lifestyle and preventive care can promote health and wellness. Preventive health guidelines for adults include the following key practices.  . A routine yearly physical is a good way to check with your health care provider about your health and preventive screening. It is a chance to share any concerns and updates on your health and to receive a thorough exam.  . Visit your dentist for a routine exam and preventive care every 6 months. Brush your teeth twice a day and floss once a day. Good oral hygiene prevents tooth decay and gum disease.  . The frequency of eye exams is based on your age, health, family medical history, use  of contact lenses, and other factors. Follow your health care provider's ecommendations for frequency of eye exams.  . Eat a healthy diet. Foods like vegetables, fruits, whole grains, low-fat dairy products, and lean protein foods contain the nutrients you need without too many calories. Decrease your intake of foods high in solid fats, added sugars, and salt. Eat the right amount of calories for you. Get information about a proper diet from your health care provider, if necessary.  . Regular physical exercise is one of the most important things you can do for your health. Most adults should get at least 150  minutes of moderate-intensity exercise (any activity that increases your heart rate and causes you to sweat) each week. In addition, most adults need muscle-strengthening exercises on 2 or more days a week.  Silver Sneakers may be a benefit available to you. To determine eligibility, you may visit the website: www.silversneakers.com or contact program at [phone] 687-8795 Mon-Fri between 8AM-8PM.   . Maintain a healthy weight. The body mass index (BMI) is a screening tool to identify possible weight problems. It provides an estimate of body fat based on height and weight. Your health care provider can find your BMI and can help you achieve or maintain a healthy weight.   For adults 20 years and older: ? A BMI below 18.5 is considered underweight. ? A BMI of 18.5 to 24.9 is normal. ? A BMI of 25 to 29.9 is considered overweight. ? A BMI of 30 and above is considered obese.   . Maintain normal blood lipids and cholesterol levels by exercising and minimizing your intake of saturated fat. Eat a balanced diet with plenty of fruit and vegetables. Blood tests for lipids and cholesterol should begin at age 35 and be repeated every 5 years. If your lipid or cholesterol levels are high, you are over 50, or you are at high risk for heart disease, you may need your cholesterol levels checked more frequently. Ongoing high lipid and cholesterol levels should be treated with medicines if diet and exercise  are not working.  . If you smoke, find out from your health care provider how to quit. If you do not use tobacco, please do not start.  . If you choose to drink alcohol, please do not consume more than 2 drinks per day. One drink is considered to be 12 ounces (355 mL) of beer, 5 ounces (148 mL) of wine, or 1.5 ounces (44 mL) of liquor.  . If you are 86-69 years old, ask your health care provider if you should take aspirin to prevent strokes.  . Use sunscreen. Apply sunscreen liberally and repeatedly throughout  the day. You should seek shade when your shadow is shorter than you. Protect yourself by wearing long sleeves, pants, a wide-brimmed hat, and sunglasses year round, whenever you are outdoors.  . Once a month, do a whole body skin exam, using a mirror to look at the skin on your back. Tell your health care provider of new moles, moles that have irregular borders, moles that are larger than a pencil eraser, or moles that have changed in shape or color.

## 2016-09-09 DIAGNOSIS — N183 Chronic kidney disease, stage 3 (moderate): Secondary | ICD-10-CM | POA: Diagnosis not present

## 2016-09-09 DIAGNOSIS — I129 Hypertensive chronic kidney disease with stage 1 through stage 4 chronic kidney disease, or unspecified chronic kidney disease: Secondary | ICD-10-CM | POA: Diagnosis not present

## 2016-09-09 DIAGNOSIS — I10 Essential (primary) hypertension: Secondary | ICD-10-CM | POA: Diagnosis not present

## 2016-09-09 DIAGNOSIS — Z888 Allergy status to other drugs, medicaments and biological substances status: Secondary | ICD-10-CM | POA: Diagnosis not present

## 2016-09-09 DIAGNOSIS — Z7982 Long term (current) use of aspirin: Secondary | ICD-10-CM | POA: Diagnosis not present

## 2016-09-09 DIAGNOSIS — E1122 Type 2 diabetes mellitus with diabetic chronic kidney disease: Secondary | ICD-10-CM | POA: Diagnosis not present

## 2016-09-09 DIAGNOSIS — C7951 Secondary malignant neoplasm of bone: Secondary | ICD-10-CM | POA: Diagnosis not present

## 2016-09-09 DIAGNOSIS — Z79899 Other long term (current) drug therapy: Secondary | ICD-10-CM | POA: Diagnosis not present

## 2016-09-09 DIAGNOSIS — Z9079 Acquired absence of other genital organ(s): Secondary | ICD-10-CM | POA: Diagnosis not present

## 2016-09-09 DIAGNOSIS — C61 Malignant neoplasm of prostate: Secondary | ICD-10-CM | POA: Diagnosis not present

## 2016-09-09 DIAGNOSIS — Z923 Personal history of irradiation: Secondary | ICD-10-CM | POA: Diagnosis not present

## 2016-10-03 ENCOUNTER — Other Ambulatory Visit: Payer: Self-pay | Admitting: Internal Medicine

## 2016-10-03 DIAGNOSIS — I517 Cardiomegaly: Secondary | ICD-10-CM

## 2016-10-03 DIAGNOSIS — I10 Essential (primary) hypertension: Secondary | ICD-10-CM

## 2016-10-05 DIAGNOSIS — Z9079 Acquired absence of other genital organ(s): Secondary | ICD-10-CM | POA: Diagnosis not present

## 2016-10-05 DIAGNOSIS — Z87891 Personal history of nicotine dependence: Secondary | ICD-10-CM | POA: Diagnosis not present

## 2016-10-05 DIAGNOSIS — I129 Hypertensive chronic kidney disease with stage 1 through stage 4 chronic kidney disease, or unspecified chronic kidney disease: Secondary | ICD-10-CM | POA: Diagnosis not present

## 2016-10-05 DIAGNOSIS — K219 Gastro-esophageal reflux disease without esophagitis: Secondary | ICD-10-CM | POA: Diagnosis not present

## 2016-10-05 DIAGNOSIS — Z9841 Cataract extraction status, right eye: Secondary | ICD-10-CM | POA: Diagnosis not present

## 2016-10-05 DIAGNOSIS — Z8042 Family history of malignant neoplasm of prostate: Secondary | ICD-10-CM | POA: Diagnosis not present

## 2016-10-05 DIAGNOSIS — N183 Chronic kidney disease, stage 3 (moderate): Secondary | ICD-10-CM | POA: Diagnosis not present

## 2016-10-05 DIAGNOSIS — E1122 Type 2 diabetes mellitus with diabetic chronic kidney disease: Secondary | ICD-10-CM | POA: Diagnosis not present

## 2016-10-05 DIAGNOSIS — N320 Bladder-neck obstruction: Secondary | ICD-10-CM | POA: Diagnosis not present

## 2016-10-05 DIAGNOSIS — Z923 Personal history of irradiation: Secondary | ICD-10-CM | POA: Diagnosis not present

## 2016-10-05 DIAGNOSIS — N393 Stress incontinence (female) (male): Secondary | ICD-10-CM | POA: Diagnosis not present

## 2016-10-05 DIAGNOSIS — C61 Malignant neoplasm of prostate: Secondary | ICD-10-CM | POA: Diagnosis not present

## 2016-10-05 DIAGNOSIS — C7951 Secondary malignant neoplasm of bone: Secondary | ICD-10-CM | POA: Diagnosis not present

## 2016-10-05 DIAGNOSIS — C7919 Secondary malignant neoplasm of other urinary organs: Secondary | ICD-10-CM | POA: Diagnosis not present

## 2016-10-05 DIAGNOSIS — Z961 Presence of intraocular lens: Secondary | ICD-10-CM | POA: Diagnosis not present

## 2016-10-05 DIAGNOSIS — N529 Male erectile dysfunction, unspecified: Secondary | ICD-10-CM | POA: Diagnosis not present

## 2016-10-05 DIAGNOSIS — N135 Crossing vessel and stricture of ureter without hydronephrosis: Secondary | ICD-10-CM | POA: Diagnosis not present

## 2016-10-05 DIAGNOSIS — Z7982 Long term (current) use of aspirin: Secondary | ICD-10-CM | POA: Diagnosis not present

## 2016-10-05 DIAGNOSIS — Z7984 Long term (current) use of oral hypoglycemic drugs: Secondary | ICD-10-CM | POA: Diagnosis not present

## 2016-10-05 DIAGNOSIS — R011 Cardiac murmur, unspecified: Secondary | ICD-10-CM | POA: Diagnosis not present

## 2016-10-05 DIAGNOSIS — Z8546 Personal history of malignant neoplasm of prostate: Secondary | ICD-10-CM | POA: Diagnosis not present

## 2016-10-05 DIAGNOSIS — E785 Hyperlipidemia, unspecified: Secondary | ICD-10-CM | POA: Diagnosis not present

## 2016-10-05 DIAGNOSIS — Z888 Allergy status to other drugs, medicaments and biological substances status: Secondary | ICD-10-CM | POA: Diagnosis not present

## 2016-10-05 DIAGNOSIS — N359 Urethral stricture, unspecified: Secondary | ICD-10-CM | POA: Diagnosis not present

## 2016-10-05 DIAGNOSIS — E669 Obesity, unspecified: Secondary | ICD-10-CM | POA: Diagnosis not present

## 2016-10-05 DIAGNOSIS — Z9842 Cataract extraction status, left eye: Secondary | ICD-10-CM | POA: Diagnosis not present

## 2016-10-05 DIAGNOSIS — M1990 Unspecified osteoarthritis, unspecified site: Secondary | ICD-10-CM | POA: Diagnosis not present

## 2016-10-14 DIAGNOSIS — N183 Chronic kidney disease, stage 3 (moderate): Secondary | ICD-10-CM | POA: Diagnosis not present

## 2016-10-14 DIAGNOSIS — Z9079 Acquired absence of other genital organ(s): Secondary | ICD-10-CM | POA: Diagnosis not present

## 2016-10-14 DIAGNOSIS — Z7982 Long term (current) use of aspirin: Secondary | ICD-10-CM | POA: Diagnosis not present

## 2016-10-14 DIAGNOSIS — Z888 Allergy status to other drugs, medicaments and biological substances status: Secondary | ICD-10-CM | POA: Diagnosis not present

## 2016-10-14 DIAGNOSIS — C61 Malignant neoplasm of prostate: Secondary | ICD-10-CM | POA: Diagnosis not present

## 2016-10-14 DIAGNOSIS — I129 Hypertensive chronic kidney disease with stage 1 through stage 4 chronic kidney disease, or unspecified chronic kidney disease: Secondary | ICD-10-CM | POA: Diagnosis not present

## 2016-10-14 DIAGNOSIS — E1122 Type 2 diabetes mellitus with diabetic chronic kidney disease: Secondary | ICD-10-CM | POA: Diagnosis not present

## 2016-10-14 DIAGNOSIS — Z7984 Long term (current) use of oral hypoglycemic drugs: Secondary | ICD-10-CM | POA: Diagnosis not present

## 2016-10-14 DIAGNOSIS — Z79899 Other long term (current) drug therapy: Secondary | ICD-10-CM | POA: Diagnosis not present

## 2016-10-14 DIAGNOSIS — C7951 Secondary malignant neoplasm of bone: Secondary | ICD-10-CM | POA: Diagnosis not present

## 2016-11-17 ENCOUNTER — Ambulatory Visit (INDEPENDENT_AMBULATORY_CARE_PROVIDER_SITE_OTHER): Payer: Medicare Other | Admitting: Internal Medicine

## 2016-11-17 ENCOUNTER — Encounter: Payer: Self-pay | Admitting: Internal Medicine

## 2016-11-17 VITALS — BP 150/70 | HR 57 | Temp 98.2°F | Ht 71.0 in | Wt 267.2 lb

## 2016-11-17 DIAGNOSIS — R600 Localized edema: Secondary | ICD-10-CM | POA: Diagnosis not present

## 2016-11-17 DIAGNOSIS — C61 Malignant neoplasm of prostate: Secondary | ICD-10-CM | POA: Diagnosis not present

## 2016-11-17 DIAGNOSIS — I10 Essential (primary) hypertension: Secondary | ICD-10-CM | POA: Diagnosis not present

## 2016-11-17 MED ORDER — LISINOPRIL-HYDROCHLOROTHIAZIDE 20-12.5 MG PO TABS: ORAL_TABLET | ORAL | 6 refills | Status: DC

## 2016-11-17 NOTE — Progress Notes (Signed)
Patient ID: Jeremiah Collins, male   DOB: October 20, 1934, 83 y.o.   MRN: [id number]    Location:  [facility] Place of Service: OFFICE  Chief Complaint  Patient presents with  . Other    Post Surgery Appt    HPI:  83 yo male seen today for f/u HTN. Since beginning Xtandi, BP fluctuating and readings higher than nml. Dr Marcello Moores increased lisinopril HCT to BID in Oct 2017 and no change made to metoprolol. He lost 7 lbs since last OV intentionally. He checks BP at home, AM SBP 170-180; PM prior to Xtandi dose SBP 130. No CP, SOB, HA or dizziness  Past Medical History:  Diagnosis Date  . Acute bronchitis 09/12/2012  . 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 Bone metastases ([place])    left mid tibial shaft  . Cataract    Dr.Groat  . Cellulitis and abscess of leg, except foot 01/17/2012  . Chest pain, unspecified 04/28/2004  . Closed fracture of five ribs 03/24/2004  . First degree atrioventricular block 04/25/2007  . Gross hematuria 09/06/2011  . History of radiation therapy 04/03/14- 04/17/14   mid to distal left tibia 3000 cGy in 10 sessions  . HTN (hypertension)   . Hx of radiation therapy 11/1998   prostate fossa - 6040 cGy, 33 fx, Dr Danny Lawless  . Osteoarthrosis involving, or with mention of more than one site, but not specified as generalized, multiple sites 10/22/2010  . Other abnormal blood chemistry 04/29/1991  . Other and unspecified hyperlipidemia 01/02/2013  . Palpitations 04/28/2004  . Prostate cancer ([place]) 12/16/1994   gleason 7  . Reflux esophagitis 05/10/2008  . Routine general medical examination at a health care facility   . Spinal stenosis, unspecified region other than cervical 04/29/1995  . Type II or unspecified type diabetes mellitus without mention of complication, uncontrolled     Past Surgical History:  Procedure Laterality Date  . AIR/FLUID EXCHANGE Left 01/09/2016   Procedure: AIR/FLUID EXCHANGE;  Surgeon: Jalene Mullet, MD;  Location: [place];  Service: Ophthalmology;   Laterality: Left;  . CYSTOSCOPY W/ URETERAL STENT PLACEMENT  12/31/14  . CYSTOSCOPY W/ URETERAL STENT PLACEMENT  05/16/2015  . EYE SURGERY  2006   cataract, Dr Shanon Rosser  . LASER PHOTO ABLATION Left 01/09/2016   Procedure: LASER PHOTO ABLATION;  Surgeon: Jalene Mullet, MD;  Location: [place];  Service: Ophthalmology;  Laterality: Left;  Endolaser  . Left ureteral stent placement  Week of 12/05/11  . PARS PLANA VITRECTOMY Left 01/09/2016   Procedure: PARS PLANA VITRECTOMY WITH 25 GAUGE LEFT EYE ;  Surgeon: Jalene Mullet, MD;  Location: [place];  Service: Ophthalmology;  Laterality: Left;  . PERFLUORONE INJECTION Left 01/09/2016   Procedure: PERFLUORONE INJECTION;  Surgeon: Jalene Mullet, MD;  Location: [place];  Service: Ophthalmology;  Laterality: Left;  . PROSTATECTOMY  1996   Dr. Rosana Hoes    Patient Care Team: [name], DO as PCP - General (Internal Medicine) Myrlene Broker, MD as Attending Physician (Urology) [name], MD as Consulting Physician (Ophthalmology) [name], MD as Consulting Physician (Ophthalmology)  Social History   Social History  . Marital status: Married    Spouse name: N/A  . Number of children: N/A  . Years of education: N/A   Occupational History  . Not on file.   Social History Main Topics  . Smoking status: Former Smoker    Types: Cigars    Quit date: 12/16/1968  . Smokeless tobacco: Never Used  . Alcohol use No  .  Drug use: No  . Sexual activity: No   Other Topics Concern  . Not on file   Social History Narrative  . No narrative on file     reports that he quit smoking about 47 years ago. His smoking use included Cigars. He has never used smokeless tobacco. He reports that he does not drink alcohol or use drugs.  Family History  Problem Relation Age of Onset  . Heart disease Father   . Stroke Sister    Family Status  Relation Status  . Father Deceased  . Mother Deceased  . Sister Deceased  . Sister Deceased  . Sister Deceased      Allergies  Allergen Reactions  . Abiraterone Other (See Comments) and Nausea And Vomiting    Other reaction(s): Increased Heart Rate (intolerance) Other reaction(s): Increased Heart Rate (intolerance) Other reaction(s): Increased Heart Rate (intolerance)    Medications: Patient's Medications  New Prescriptions   No medications on file  Previous Medications   ASPIRIN 81 MG TABLET    Take 1 tablet (81 mg total) by mouth daily.   CHOLECALCIFEROL 1000 UNITS TABLET    Take 1,000 Units by mouth daily.   DOXAZOSIN (CARDURA) 4 MG TABLET    TAKE ONE TABLET BY MOUTH ONCE DAILY   ENZALUTAMIDE (XTANDI) 40 MG CAPSULE    Take 40 mg by mouth daily. Take 4 per day   LEUPROLIDE, 6 MONTH, (LEUPROLIDE ACETATE, 6 MONTH,) 45 MG INJECTION    Inject 45 mg into the skin every 6 (six) months. Filled by [facility] (PRINZIDE,ZESTORETIC) 20-12.5 MG TABLET    Take 1 tablet by mouth 2 (two) times daily.   METFORMIN (GLUCOPHAGE) 500 MG TABLET    TAKE ONE TABLET BY MOUTH ONCE DAILY WITH BREAKFAST   METOPROLOL (LOPRESSOR) 50 MG TABLET    TAKE ONE TABLET BY MOUTH ONCE DAILY   SIMVASTATIN (ZOCOR) 5 MG TABLET    TAKE ONE TABLET BY MOUTH ONCE DAILY   TRAMADOL (ULTRAM) 50 MG TABLET    Take 50 mg by mouth.  Modified Medications   No medications on file  Discontinued Medications   LISINOPRIL-HYDROCHLOROTHIAZIDE (PRINZIDE,ZESTORETIC) 20-12.5 MG TABLET    TAKE ONE TABLET BY MOUTH DAILY    Review of Systems  All other systems reviewed and are negative.   Vitals:   11/17/16 1132  BP: (!) 150/70  Pulse: (!) 57  Temp: 98.2 F (36.8 C)  TempSrc: Oral  SpO2: 97%  Weight: 267 lb 3.2 oz (121.2 kg)  Height: _0  (1.803 m)   Body mass index is 37.27 kg/m.  Physical Exam  Constitutional: He is oriented to person, place, and time. He appears well-developed and well-nourished.  Cardiovascular: Regular rhythm and intact distal pulses.  Bradycardia present.  Exam reveals no  gallop and no friction rub.   Murmur (1/6 SEM) heard. +1 pitting LE edema b/l. No calf TTP  Pulmonary/Chest: Effort normal and breath sounds normal. No respiratory distress. He has no wheezes. He has no rales. He exhibits no tenderness.  Musculoskeletal: He exhibits edema and tenderness.  Neurological: He is alert and oriented to person, place, and time.  Skin: Skin is warm and dry. No rash noted.  Psychiatric: He has a normal mood and affect. His behavior is normal. Thought content normal.     Labs reviewed: Appointment on 09/01/2016  Component Date Value Ref Range Status  . Sodium 09/02/2016 141  135 - 146 mmol/L Final  . Potassium 09/02/2016 4.2  3.5 - 5.3 mmol/L Final  . Chloride 09/02/2016 107  98 - 110 mmol/L Final  . CO2 09/02/2016 22  20 - 31 mmol/L Final  . Glucose, Bld 09/02/2016 130* 65 - 99 mg/dL Final  . BUN 09/02/2016 22  7 - 25 mg/dL Final  . Creat 09/02/2016 1.16  0.70 - 1.18 mg/dL Final   Comment:   For patients > or = 81 years of age: The upper reference limit for Creatinine is approximately 13% higher for people identified as African-American.     . Total Bilirubin 09/02/2016 0.4  0.2 - 1.2 mg/dL Final  . Alkaline Phosphatase 09/02/2016 77  40 - 115 U/L Final  . AST 09/02/2016 11  10 - 35 U/L Final  . ALT 09/02/2016 9  9 - 46 U/L Final  . Total Protein 09/02/2016 5.9* 6.1 - 8.1 g/dL Final  . Albumin 09/02/2016 3.3* 3.6 - 5.1 g/dL Final  . Calcium 09/02/2016 8.7  8.6 - 10.3 mg/dL Final  . Cholesterol 09/02/2016 155  125 - 200 mg/dL Final  . Triglycerides 09/02/2016 82  <150 mg/dL Final  . HDL 09/02/2016 46  >=40 mg/dL Final  . Total CHOL/HDL Ratio 09/02/2016 3.4  <=5.0 Ratio Final  . VLDL 09/02/2016 16  <30 mg/dL Final  . LDL Cholesterol 09/02/2016 93  <130 mg/dL Final   Comment:   Total Cholesterol/HDL Ratio:CHD Risk                        Coronary Heart Disease Risk Table                                        Men       Women          1/2 Average Risk               3.4        3.3              Average Risk              5.0        4.4           2X Average Risk              9.6        7.1           3X Average Risk             23.4       11.0 Use the calculated Patient Ratio above and the CHD Risk table  to determine the patient's CHD Risk.   Marland Kitchen TSH 09/02/2016 1.26  0.40 - 4.50 mIU/L Final  . Hgb A1c MFr Bld 09/02/2016 6.0* <5.7 % Final   Comment:   For someone without known diabetes, a hemoglobin A1c value between 5.7% and 6.4% is consistent with prediabetes and should be confirmed with a follow-up test.   For someone with known diabetes, a value <7% indicates that their diabetes is well controlled. A1c targets should be individualized based on duration of diabetes, age, co-morbid conditions and other considerations.   This assay result is consistent with an increased risk of diabetes.   Currently, no consensus exists regarding use of hemoglobin A1c for diagnosis of diabetes in children.     . Mean Plasma Glucose 09/02/2016 126  mg/dL Final  .  WBC 09/02/2016 7.4  3.8 - 10.8 K/uL Final  . RBC 09/02/2016 4.42  4.20 - 5.80 MIL/uL Final  . Hemoglobin 09/02/2016 11.9* 13.2 - 17.1 g/dL Final  . HCT 09/02/2016 36.2* 38.5 - 50.0 % Final  . MCV 09/02/2016 81.9  80.0 - 100.0 fL Final  . MCH 09/02/2016 26.9* 27.0 - 33.0 pg Final  . MCHC 09/02/2016 32.9  32.0 - 36.0 g/dL Final  . RDW 09/02/2016 15.8* 11.0 - 15.0 % Final  . Platelets 09/02/2016 194  140 - 400 K/uL Final  . MPV 09/02/2016 9.3  7.5 - 12.5 fL Final  . Neutro Abs 09/02/2016 4588  1,500 - 7,800 cells/uL Final  . Lymphs Abs 09/02/2016 2146  850 - 3,900 cells/uL Final  . Monocytes Absolute 09/02/2016 444  200 - 950 cells/uL Final  . Eosinophils Absolute 09/02/2016 222  15 - 500 cells/uL Final  . Basophils Absolute 09/02/2016 0  0 - 200 cells/uL Final  . Neutrophils Relative % 09/02/2016 62  % Final  . Lymphocytes Relative 09/02/2016 29  % Final  . Monocytes Relative 09/02/2016 6   % Final  . Eosinophils Relative 09/02/2016 3  % Final  . Basophils Relative 09/02/2016 0  % Final  . Smear Review 09/02/2016 Criteria for review not met   Final    No results found.   Assessment/Plan   ICD-9-CM ICD-10-CM   1. Essential hypertension, benign 401.1 I10 lisinopril-hydrochlorothiazide (PRINZIDE,ZESTORETIC) 20-12.5 MG tablet  2. Bilateral edema of lower extremity 782.3 R60.0   3. Malignant neoplasm of prostate (HCC) 185 C61    Change lisinopril HCT to 2 tabs in morning and 1 tab in evening  Continue other medications as ordered  Follow up with Dr Marcello Moores as scheduled  Follow up as scheduled next month    Jakori Burkett S. Perlie Gold  [facility] [address] [place], [place] [address] [phone] Cell (Monday-Friday 8 AM - 5 PM) [phone] After 5 PM and follow prompts

## 2016-11-17 NOTE — Patient Instructions (Addendum)
Change lisinopril HCT to 2 tabs in morning and 1 tab in evening  Continue other medications as ordered  Follow up with Dr Marcello Moores as scheduled  Follow up as scheduled next month

## 2016-12-01 ENCOUNTER — Other Ambulatory Visit: Payer: Self-pay | Admitting: Internal Medicine

## 2016-12-14 DIAGNOSIS — Z5111 Encounter for antineoplastic chemotherapy: Secondary | ICD-10-CM | POA: Diagnosis not present

## 2016-12-14 DIAGNOSIS — C61 Malignant neoplasm of prostate: Secondary | ICD-10-CM | POA: Diagnosis not present

## 2016-12-28 DIAGNOSIS — H3509 Other intraretinal microvascular abnormalities: Secondary | ICD-10-CM | POA: Diagnosis not present

## 2016-12-28 DIAGNOSIS — H3562 Retinal hemorrhage, left eye: Secondary | ICD-10-CM | POA: Diagnosis not present

## 2016-12-28 DIAGNOSIS — H35352 Cystoid macular degeneration, left eye: Secondary | ICD-10-CM | POA: Diagnosis not present

## 2016-12-28 DIAGNOSIS — H31092 Other chorioretinal scars, left eye: Secondary | ICD-10-CM | POA: Diagnosis not present

## 2016-12-28 DIAGNOSIS — H35372 Puckering of macula, left eye: Secondary | ICD-10-CM | POA: Diagnosis not present

## 2017-01-03 ENCOUNTER — Other Ambulatory Visit: Payer: Medicare Other

## 2017-01-03 DIAGNOSIS — E0821 Diabetes mellitus due to underlying condition with diabetic nephropathy: Secondary | ICD-10-CM | POA: Diagnosis not present

## 2017-01-03 DIAGNOSIS — E782 Mixed hyperlipidemia: Secondary | ICD-10-CM

## 2017-01-03 LAB — LIPID PANEL
CHOL/HDL RATIO: 3.5 ratio (ref <5.0)
CHOLESTEROL: 149 mg/dL (ref <200)
HDL: 42 mg/dL (ref >40)
LDL Cholesterol: 90 mg/dL (ref <100)
TRIGLYCERIDES: 83 mg/dL (ref <150)
VLDL: 17 mg/dL (ref <30)

## 2017-01-03 LAB — HEMOGLOBIN A1C
HEMOGLOBIN A1C: 5.8 % — AB (ref <5.7)
Mean Plasma Glucose: 120 mg/dL

## 2017-01-04 ENCOUNTER — Other Ambulatory Visit: Payer: Medicare Other

## 2017-01-04 DIAGNOSIS — Z6836 Body mass index (BMI) 36.0-36.9, adult: Secondary | ICD-10-CM | POA: Diagnosis not present

## 2017-01-04 DIAGNOSIS — C7982 Secondary malignant neoplasm of genital organs: Secondary | ICD-10-CM | POA: Diagnosis not present

## 2017-01-04 DIAGNOSIS — E1122 Type 2 diabetes mellitus with diabetic chronic kidney disease: Secondary | ICD-10-CM | POA: Diagnosis not present

## 2017-01-04 DIAGNOSIS — Z9079 Acquired absence of other genital organ(s): Secondary | ICD-10-CM | POA: Diagnosis not present

## 2017-01-04 DIAGNOSIS — Z466 Encounter for fitting and adjustment of urinary device: Secondary | ICD-10-CM | POA: Diagnosis not present

## 2017-01-04 DIAGNOSIS — N320 Bladder-neck obstruction: Secondary | ICD-10-CM | POA: Diagnosis not present

## 2017-01-04 DIAGNOSIS — N183 Chronic kidney disease, stage 3 (moderate): Secondary | ICD-10-CM | POA: Diagnosis not present

## 2017-01-04 DIAGNOSIS — Z888 Allergy status to other drugs, medicaments and biological substances status: Secondary | ICD-10-CM | POA: Diagnosis not present

## 2017-01-04 DIAGNOSIS — C61 Malignant neoplasm of prostate: Secondary | ICD-10-CM | POA: Diagnosis not present

## 2017-01-04 DIAGNOSIS — Z8546 Personal history of malignant neoplasm of prostate: Secondary | ICD-10-CM | POA: Diagnosis not present

## 2017-01-04 DIAGNOSIS — E785 Hyperlipidemia, unspecified: Secondary | ICD-10-CM | POA: Diagnosis not present

## 2017-01-04 DIAGNOSIS — M1990 Unspecified osteoarthritis, unspecified site: Secondary | ICD-10-CM | POA: Diagnosis not present

## 2017-01-04 DIAGNOSIS — Z7984 Long term (current) use of oral hypoglycemic drugs: Secondary | ICD-10-CM | POA: Diagnosis not present

## 2017-01-04 DIAGNOSIS — K219 Gastro-esophageal reflux disease without esophagitis: Secondary | ICD-10-CM | POA: Diagnosis not present

## 2017-01-04 DIAGNOSIS — E669 Obesity, unspecified: Secondary | ICD-10-CM | POA: Diagnosis not present

## 2017-01-04 DIAGNOSIS — Z923 Personal history of irradiation: Secondary | ICD-10-CM | POA: Diagnosis not present

## 2017-01-04 DIAGNOSIS — D631 Anemia in chronic kidney disease: Secondary | ICD-10-CM | POA: Diagnosis not present

## 2017-01-04 DIAGNOSIS — I129 Hypertensive chronic kidney disease with stage 1 through stage 4 chronic kidney disease, or unspecified chronic kidney disease: Secondary | ICD-10-CM | POA: Diagnosis not present

## 2017-01-04 DIAGNOSIS — Z87891 Personal history of nicotine dependence: Secondary | ICD-10-CM | POA: Diagnosis not present

## 2017-01-04 DIAGNOSIS — N135 Crossing vessel and stricture of ureter without hydronephrosis: Secondary | ICD-10-CM | POA: Diagnosis not present

## 2017-01-04 DIAGNOSIS — Z7982 Long term (current) use of aspirin: Secondary | ICD-10-CM | POA: Diagnosis not present

## 2017-01-04 DIAGNOSIS — Z79899 Other long term (current) drug therapy: Secondary | ICD-10-CM | POA: Diagnosis not present

## 2017-01-04 DIAGNOSIS — Z9889 Other specified postprocedural states: Secondary | ICD-10-CM | POA: Diagnosis not present

## 2017-01-07 ENCOUNTER — Encounter: Payer: Self-pay | Admitting: Internal Medicine

## 2017-01-07 ENCOUNTER — Ambulatory Visit (INDEPENDENT_AMBULATORY_CARE_PROVIDER_SITE_OTHER): Payer: Medicare Other | Admitting: Internal Medicine

## 2017-01-07 VITALS — BP 162/80 | HR 54 | Temp 97.8°F | Ht 71.0 in | Wt 262.6 lb

## 2017-01-07 DIAGNOSIS — L539 Erythematous condition, unspecified: Secondary | ICD-10-CM | POA: Diagnosis not present

## 2017-01-07 DIAGNOSIS — E782 Mixed hyperlipidemia: Secondary | ICD-10-CM

## 2017-01-07 DIAGNOSIS — I10 Essential (primary) hypertension: Secondary | ICD-10-CM | POA: Diagnosis not present

## 2017-01-07 DIAGNOSIS — C61 Malignant neoplasm of prostate: Secondary | ICD-10-CM

## 2017-01-07 DIAGNOSIS — E0821 Diabetes mellitus due to underlying condition with diabetic nephropathy: Secondary | ICD-10-CM

## 2017-01-07 DIAGNOSIS — R600 Localized edema: Secondary | ICD-10-CM

## 2017-01-07 DIAGNOSIS — M150 Primary generalized (osteo)arthritis: Secondary | ICD-10-CM

## 2017-01-07 DIAGNOSIS — M159 Polyosteoarthritis, unspecified: Secondary | ICD-10-CM

## 2017-01-07 NOTE — Patient Instructions (Addendum)
Continue current medications  Watch redness on neck. If worsens or persists, please call office  Follow up with specialists as scheduled  Follow up in 5 mos for HTN, DM visit. Fasting labs prior to appt

## 2017-01-07 NOTE — Progress Notes (Signed)
Patient ID: Jeremiah Collins, male   DOB: [birth year], 83 y.o.   MRN: [id number]    Location:  [facility] Place of Service: OFFICE  Chief Complaint  Patient presents with  . Medical Management of Chronic Issues    4 months routine visit, HTN, HLD, DM type 2, Vit D Deficiency  . Other    Discuss Zoster Vacc.    HPI:  83 yo male seen today for f/u. We discussed zostavax and benefits/risks of vaccine. Pt has decided to "pass" on receiving vaccine. He rec'd flu shot 08/17/16. He does not need any med RF today  OD Hollenhorst plaque - he had sx x 2 as the 1st procedure was ineffective. Vision improved after 2nd sx. He is followed by Dr Judd Lien (ophthamology)  mitral annular calcification - 2 D echo revealed mild AS and calcified mass on posterior leaflet of mitral valve with severe LVH. nml heart fxn with nml EF. He has SOB. No CP or dizziness. No leg swelling. He saw cardio and determined to have mitral annular calcification but no sx recommended at this time. Cardio recommended increase in statin tx but pt declines  hx prostate CA and rec'd XRT - He is currently on leuprolide injection q71mos. He also takes cardura. PSA decreasing on Xtandi. He went for surveillance CT chest/abd/pelvis and whole body bone scan at [facility]. No changes compared to previous studies. Followed by urology Dr Rosana Hoes and oncology Dr Marcello Moores. LDH 116; PSA <0.01 (10/14/16). He had ureteral stent change on 01/04/17 with Dr Rosana Hoes. He has difficulty starting urine stream and some times needs to self cath. Albumin 3.3; Hgb 12.6  DM - BS 120s usually. He checks once weekly. No low BS reactions. No numbness or tingling. He takes metformin. A1c 5.8%. Cr 1.29. Last eye exam in 07/2016. No diabetic changes -Followed by Dr Delman Cheadle  HTN - BP stable on lisinopril-HCT and metoprolol. Takes ASA daily  Hyperlipidemia - stable on low dose simvastatin. Occasional muscle weakness. LDL 90  Multiple joint pain - mostly in back. He does not take  any meds  Hx anemia - Hgb 12.6 Ferritin 30  CKD - stage 2. Cr 1.29  Past Medical History:  Diagnosis Date  . Acute bronchitis 09/12/2012  . 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 Bone metastases ([place])    left mid tibial shaft  . Cataract    Dr.Groat  . Cellulitis and abscess of leg, except foot 01/17/2012  . Chest pain, unspecified 04/28/2004  . Closed fracture of five ribs 03/24/2004  . First degree atrioventricular block 04/25/2007  . Gross hematuria 09/06/2011  . History of radiation therapy 04/03/14- 04/17/14   mid to distal left tibia 3000 cGy in 10 sessions  . HTN (hypertension)   . Hx of radiation therapy 11/1998   prostate fossa - 6040 cGy, 33 fx, Dr Danny Lawless  . Osteoarthrosis involving, or with mention of more than one site, but not specified as generalized, multiple sites 10/22/2010  . Other abnormal blood chemistry 04/29/1991  . Other and unspecified hyperlipidemia 01/02/2013  . Palpitations 04/28/2004  . Prostate cancer ([place]) 12/16/1994   gleason 7  . Reflux esophagitis 05/10/2008  . Routine general medical examination at a health care facility   . Spinal stenosis, unspecified region other than cervical 04/29/1995  . Type II or unspecified type diabetes mellitus without mention of complication, uncontrolled     Past Surgical History:  Procedure Laterality Date  . AIR/FLUID EXCHANGE Left 01/09/2016   Procedure: AIR/FLUID EXCHANGE;  Surgeon: Jalene Mullet, MD;  Location: [place];  Service: Ophthalmology;  Laterality: Left;  . CYSTOSCOPY W/ URETERAL STENT PLACEMENT  12/31/14  . CYSTOSCOPY W/ URETERAL STENT PLACEMENT  05/16/2015  . EYE SURGERY  2006   cataract, Dr Shanon Rosser  . LASER PHOTO ABLATION Left 01/09/2016   Procedure: LASER PHOTO ABLATION;  Surgeon: Jalene Mullet, MD;  Location: [place];  Service: Ophthalmology;  Laterality: Left;  Endolaser  . Left ureteral stent placement  Week of 12/05/11  . PARS PLANA VITRECTOMY Left 01/09/2016   Procedure: PARS PLANA VITRECTOMY WITH 25  GAUGE LEFT EYE ;  Surgeon: Jalene Mullet, MD;  Location: [place];  Service: Ophthalmology;  Laterality: Left;  . PERFLUORONE INJECTION Left 01/09/2016   Procedure: PERFLUORONE INJECTION;  Surgeon: Jalene Mullet, MD;  Location: [place];  Service: Ophthalmology;  Laterality: Left;  . PROSTATECTOMY  1996   Dr. Rosana Hoes    Patient Care Team: [name], DO as PCP - General (Internal Medicine) Myrlene Broker, MD as Attending Physician (Urology) [name], MD as Consulting Physician (Ophthalmology) [name], MD as Consulting Physician (Ophthalmology)  Social History   Social History  . Marital status: Married    Spouse name: N/A  . Number of children: N/A  . Years of education: N/A   Occupational History  . Not on file.   Social History Main Topics  . Smoking status: Former Smoker    Types: Cigars    Quit date: 12/16/1968  . Smokeless tobacco: Never Used  . Alcohol use No  . Drug use: No  . Sexual activity: No   Other Topics Concern  . Not on file   Social History Narrative  . No narrative on file     reports that he quit smoking about 48 years ago. His smoking use included Cigars. He has never used smokeless tobacco. He reports that he does not drink alcohol or use drugs.  Family History  Problem Relation Age of Onset  . Heart disease Father   . Stroke Sister    Family Status  Relation Status  . Father Deceased  . Mother Deceased  . Sister Deceased  . Sister Deceased  . Sister Deceased     Allergies  Allergen Reactions  . Abiraterone Other (See Comments) and Nausea And Vomiting    Other reaction(s): Increased Heart Rate (intolerance) Other reaction(s): Increased Heart Rate (intolerance) Other reaction(s): Increased Heart Rate (intolerance)    Medications: Patient's Medications  New Prescriptions   No medications on file  Previous Medications   ASPIRIN 81 MG TABLET    Take 1 tablet (81 mg total) by mouth daily.   CHOLECALCIFEROL 1000 UNITS  TABLET    Take 1,000 Units by mouth daily.   DOXAZOSIN (CARDURA) 4 MG TABLET    TAKE ONE TABLET BY MOUTH ONCE DAILY   ENZALUTAMIDE (XTANDI) 40 MG CAPSULE    Take 40 mg by mouth daily. Take 4 per day   LEUPROLIDE, 6 MONTH, (LEUPROLIDE ACETATE, 6 MONTH,) 45 MG INJECTION    Inject 45 mg into the skin every 6 (six) months. Filled by [facility] (PRINZIDE,ZESTORETIC) 20-12.5 MG TABLET    Take 2 tabs po in AM and 1 tab po in PM for blood pressure   METFORMIN (GLUCOPHAGE) 500 MG TABLET    TAKE ONE TABLET BY MOUTH ONCE DAILY WITH BREAKFAST   METOPROLOL (LOPRESSOR) 50 MG TABLET    TAKE ONE TABLET BY MOUTH ONCE DAILY   SIMVASTATIN (ZOCOR) 5  MG TABLET    TAKE ONE TABLET BY MOUTH ONCE DAILY   TRAMADOL (ULTRAM) 50 MG TABLET    Take 50 mg by mouth.  Modified Medications   No medications on file  Discontinued Medications   No medications on file    Review of Systems  Eyes: Positive for visual disturbance.  Respiratory: Positive for shortness of breath.   Genitourinary: Positive for difficulty urinating.  All other systems reviewed and are negative.   Vitals:   01/07/17 0912  BP: (!) 162/80  Pulse: (!) 54  Temp: 97.8 F (36.6 C)  TempSrc: Oral  SpO2: 98%  Weight: 262 lb 9.6 oz (119.1 kg)  Height: 5\' 11"  (1.803 m)   Body mass index is 36.63 kg/m.  Physical Exam  Constitutional: He is oriented to person, place, and time. He appears well-developed and well-nourished. No distress.  HENT:  Mouth/Throat: Oropharynx is clear and moist. No oropharyngeal exudate.  Eyes: Pupils are equal, round, and reactive to light. No scleral icterus.  Neck: Neck supple. No thyromegaly present.  Cardiovascular: Normal rate, regular rhythm and intact distal pulses.  Exam reveals no gallop, no distant heart sounds and no friction rub.   Murmur heard.  Systolic murmur is present with a grade of 2/6  +1 pitting LE edema b/l. No calf TTP  Pulmonary/Chest: Effort normal and breath  sounds normal. No respiratory distress. He has no wheezes. He has no rales. He exhibits no tenderness.  Abdominal: Soft. Bowel sounds are normal. He exhibits no distension and no mass. There is no tenderness. There is no rebound and no guarding.  Musculoskeletal: He exhibits edema.  Lymphadenopathy:    He has no cervical adenopathy.  Neurological: He is alert and oriented to person, place, and time.  Skin: Skin is warm and dry. No rash noted. There is erythema (anterior neck/chin, nonblancheable).  Psychiatric: He has a normal mood and affect. His behavior is normal. Thought content normal.     Labs reviewed: Appointment on 01/03/2017  Component Date Value Ref Range Status  . Cholesterol 01/03/2017 149  <200 mg/dL Final  . Triglycerides 01/03/2017 83  <150 mg/dL Final  . HDL 01/03/2017 42  >40 mg/dL Final  . Total CHOL/HDL Ratio 01/03/2017 3.5  <5.0 Ratio Final  . VLDL 01/03/2017 17  <30 mg/dL Final  . LDL Cholesterol 01/03/2017 90  <100 mg/dL Final  . Hgb A1c MFr Bld 01/03/2017 5.8* <5.7 % Final   Comment:   For someone without known diabetes, a hemoglobin A1c value between 5.7% and 6.4% is consistent with prediabetes and should be confirmed with a follow-up test.   For someone with known diabetes, a value <7% indicates that their diabetes is well controlled. A1c targets should be individualized based on duration of diabetes, age, co-morbid conditions and other considerations.   This assay result is consistent with an increased risk of diabetes.   Currently, no consensus exists regarding use of hemoglobin A1c for diagnosis of diabetes in children.     . Mean Plasma Glucose 01/03/2017 120  mg/dL Final    No results found.   Assessment/Plan   ICD-9-CM ICD-10-CM   1. Erythema 695.9 L53.9    anterior neck; possible ADR of xtandi?  2. Bilateral edema of lower extremity 782.3 R60.0   3. Diabetes mellitus due to underlying condition, controlled, with diabetic nephropathy,  without long-term current use of insulin (HCC) 249.40 E08.21 Hemoglobin A1c   583.81    4. Malignant neoplasm of prostate ([place]) [place]  5. Mixed hyperlipidemia 272.2 E78.2 Lipid Panel  6. Primary osteoarthritis involving multiple joints 715.09 M15.0   7. Essential hypertension, benign 401.1 I10    Continue current medications  Watch redness on neck. If worsens or persists, please call office  Follow up with specialists as scheduled  Follow up in 5 mos for HTN, DM visit. Fasting labs prior to appt     [place] S. Perlie Gold  [facility] [address] [place], [place] [address] [phone] Cell (Monday-Friday 8 AM - 5 PM) [phone] After 5 PM and follow prompts

## 2017-01-13 DIAGNOSIS — Z888 Allergy status to other drugs, medicaments and biological substances status: Secondary | ICD-10-CM | POA: Diagnosis not present

## 2017-01-13 DIAGNOSIS — C61 Malignant neoplasm of prostate: Secondary | ICD-10-CM | POA: Diagnosis not present

## 2017-01-13 DIAGNOSIS — Z7982 Long term (current) use of aspirin: Secondary | ICD-10-CM | POA: Diagnosis not present

## 2017-01-13 DIAGNOSIS — N183 Chronic kidney disease, stage 3 (moderate): Secondary | ICD-10-CM | POA: Diagnosis not present

## 2017-01-13 DIAGNOSIS — D631 Anemia in chronic kidney disease: Secondary | ICD-10-CM | POA: Diagnosis not present

## 2017-01-13 DIAGNOSIS — Z7984 Long term (current) use of oral hypoglycemic drugs: Secondary | ICD-10-CM | POA: Diagnosis not present

## 2017-01-13 DIAGNOSIS — Z79899 Other long term (current) drug therapy: Secondary | ICD-10-CM | POA: Diagnosis not present

## 2017-01-13 DIAGNOSIS — I129 Hypertensive chronic kidney disease with stage 1 through stage 4 chronic kidney disease, or unspecified chronic kidney disease: Secondary | ICD-10-CM | POA: Diagnosis not present

## 2017-01-13 DIAGNOSIS — Z9079 Acquired absence of other genital organ(s): Secondary | ICD-10-CM | POA: Diagnosis not present

## 2017-01-13 DIAGNOSIS — E1122 Type 2 diabetes mellitus with diabetic chronic kidney disease: Secondary | ICD-10-CM | POA: Diagnosis not present

## 2017-01-15 IMAGING — US US CAROTID DUPLEX BILAT
1 series · 13 of 24 positions shown · non-contrast
Comparison: None.

CLINICAL DATA: Hollenhorst plaque.

EXAM:
BILATERAL CAROTID DUPLEX ULTRASOUND
TECHNIQUE: Gray scale imaging, color Doppler and duplex ultrasound were
performed of bilateral carotid and vertebral arteries in the neck.

[Series 1: us carotid duplex bilat · 0.08mm/px · 13 of 73 slices shown]
[im 1/73]
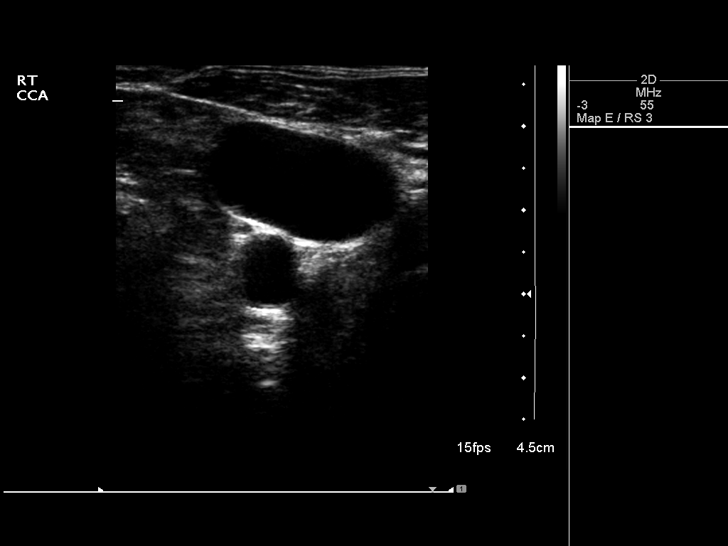
[im 7/73]
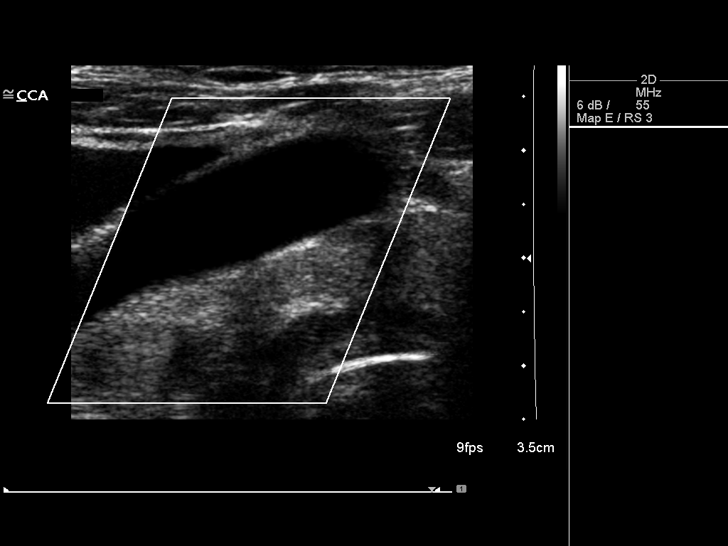
[im 13/73]
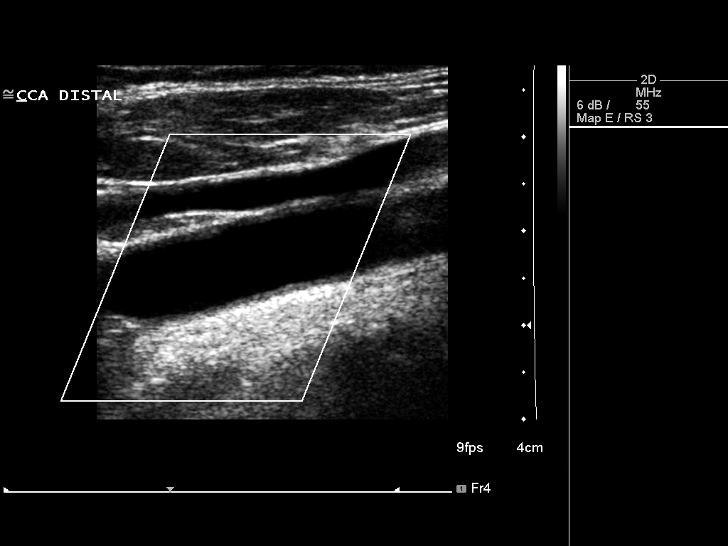
[im 19/73]
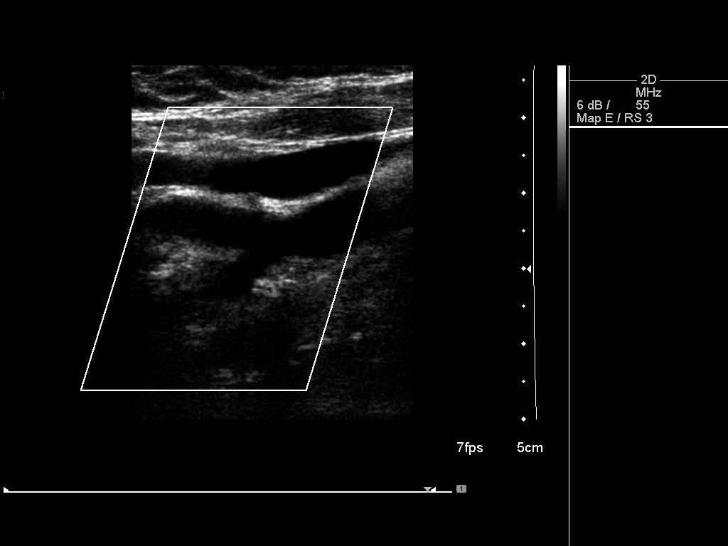
[im 26/73]
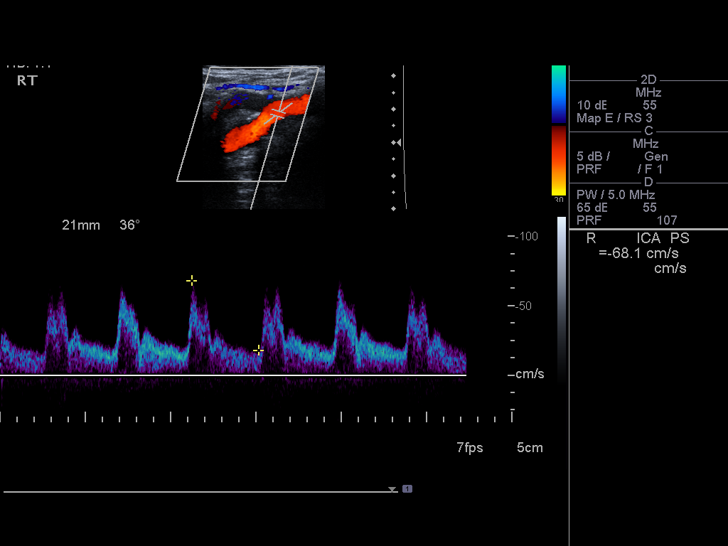
[im 32/73]
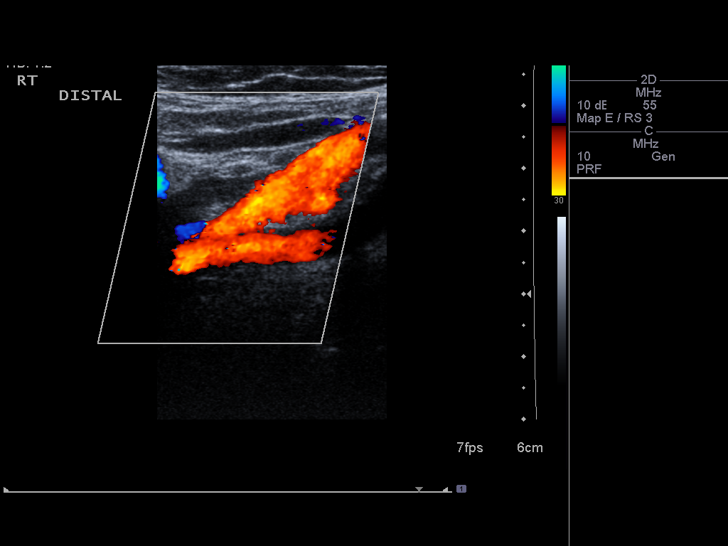
[im 38/73]
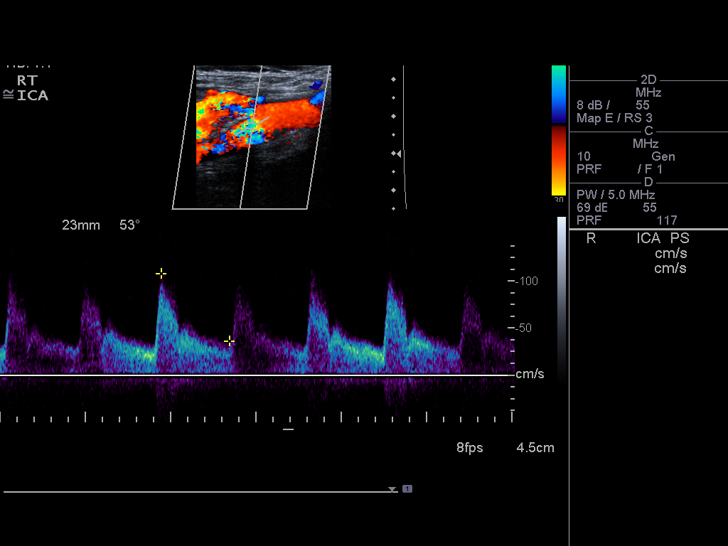
[im 41/73]
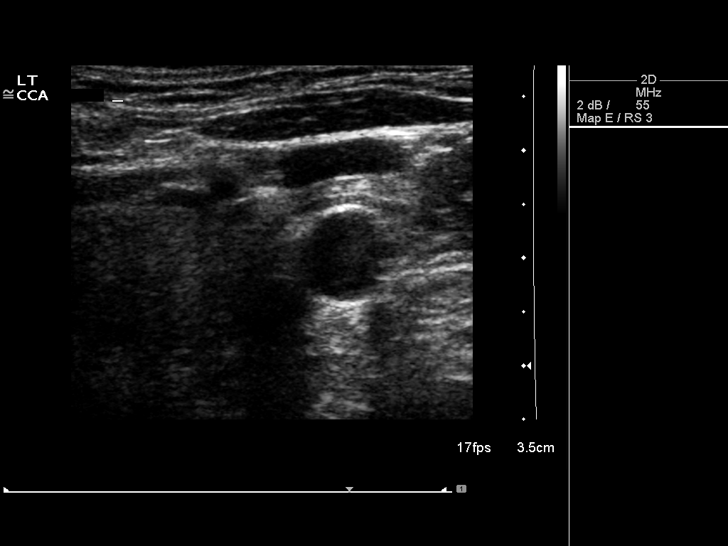
[im 47/73]
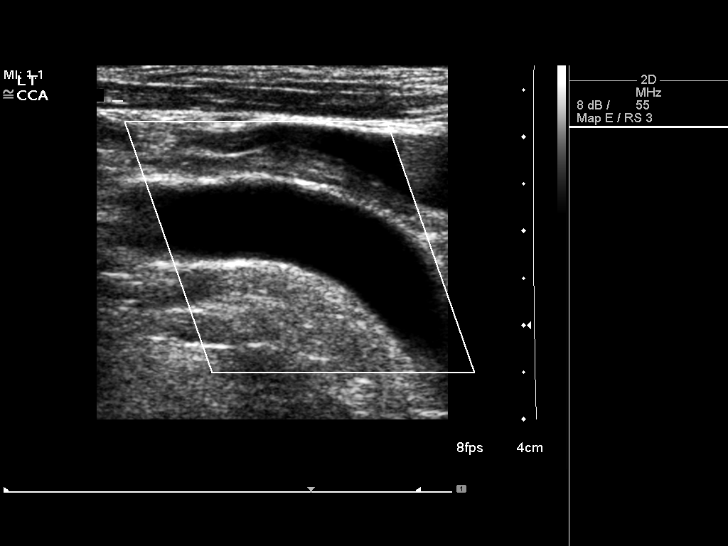
[im 54/73]
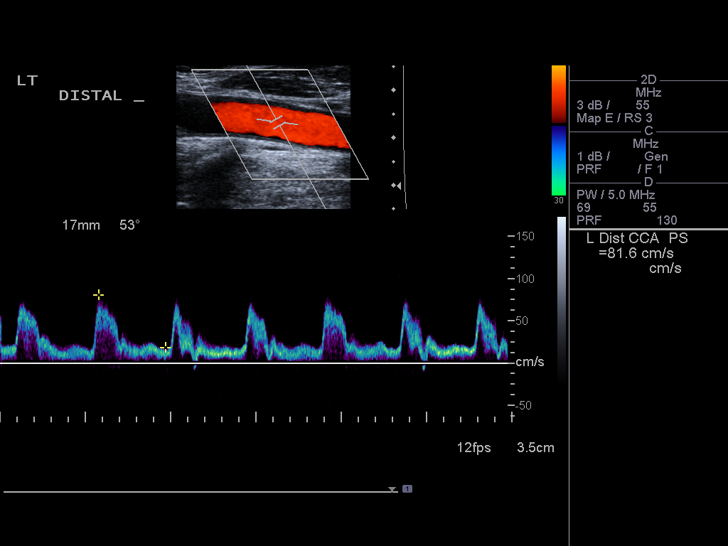
[im 60/73]
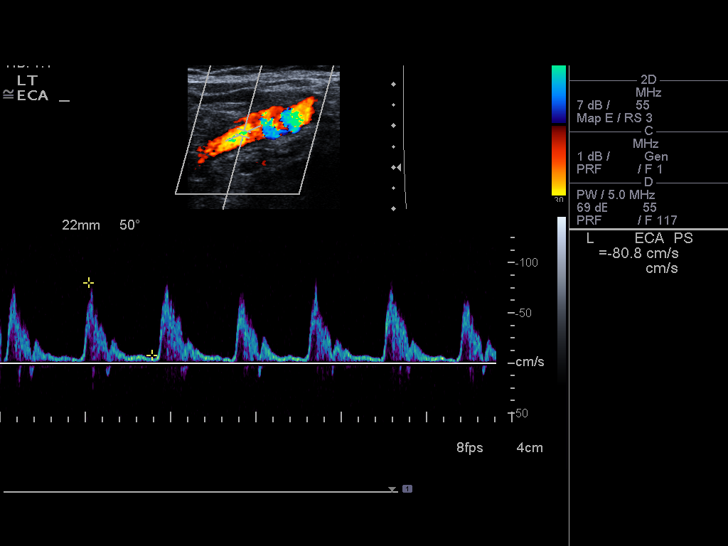
[im 66/73]
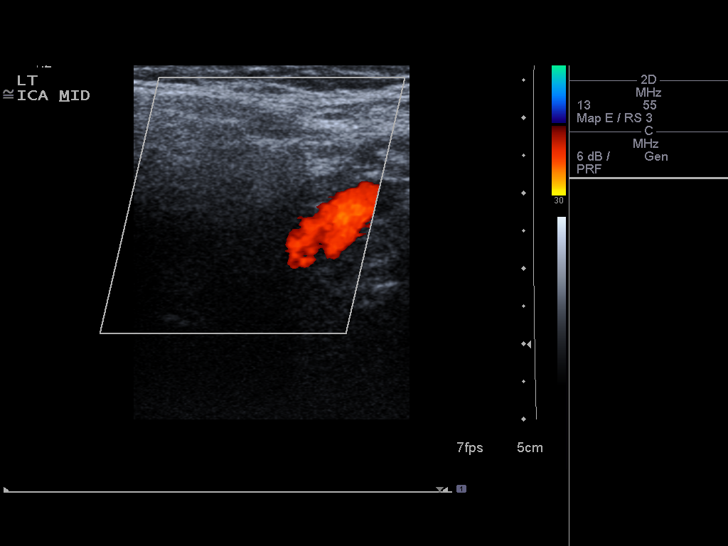
[im 73/73]
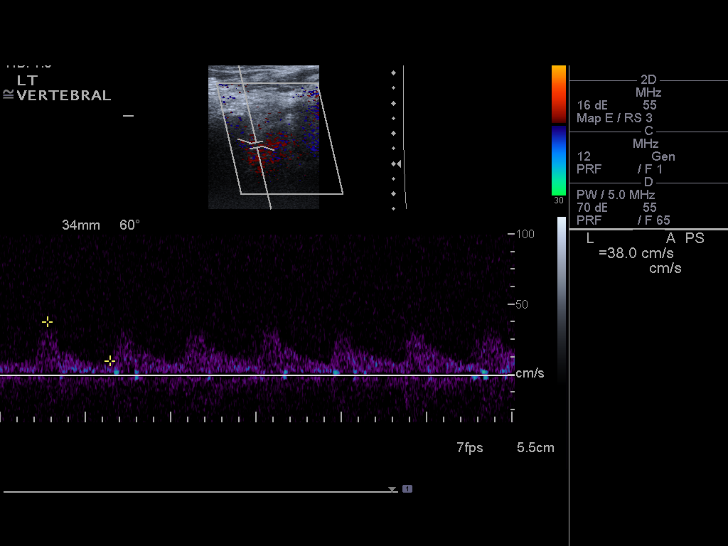

[13 of 24 positions shown; findings below may reference images not displayed]

FINDINGS: Criteria: Quantification of carotid stenosis is based on velocity
parameters that correlate the residual internal carotid diameter
with NASCET-based stenosis levels, using the diameter of the distal
internal carotid lumen as the denominator for stenosis measurement.

The following velocity measurements were obtained:

RIGHT

ICA:  108 cm/sec

CCA:  65 cm/sec

SYSTOLIC ICA/CCA RATIO:

DIASTOLIC ICA/CCA RATIO:

ECA:  124 cm/sec

LEFT

ICA:  74 cm/sec

CCA:  103 cm/sec

SYSTOLIC ICA/CCA RATIO:

DIASTOLIC ICA/CCA RATIO:

ECA:  81 cm/sec

RIGHT CAROTID ARTERY: Heterogeneous plaque at the right carotid bulb
and proximal internal carotid. Right external carotid artery is
patent with normal waveforms and velocity. Normal waveforms and
velocities in the right internal carotid artery. No significant
stenosis in the right internal carotid artery.

RIGHT VERTEBRAL ARTERY: Antegrade flow and normal waveform in the
right vertebral artery.

LEFT CAROTID ARTERY: Small amount of echogenic plaque at the left
carotid bulb. Left external carotid artery is patent with normal
waveforms and velocities. Left internal carotid artery is patent
without significant plaque or stenosis. Normal waveforms and
velocities in the left internal carotid artery.

LEFT VERTEBRAL ARTERY: Antegrade flow and normal waveform in the
left vertebral artery.
IMPRESSION: Mild atherosclerotic disease in the carotid arteries. Largest plaque
burden is at the right carotid bulb and proximal right internal
carotid artery. Estimated degree of stenosis in the internal carotid
arteries is less than 50% bilaterally.

Patent vertebral arteries.

## 2017-01-31 ENCOUNTER — Other Ambulatory Visit: Payer: Self-pay | Admitting: Internal Medicine

## 2017-03-02 ENCOUNTER — Other Ambulatory Visit: Payer: Self-pay | Admitting: Internal Medicine

## 2017-03-02 ENCOUNTER — Other Ambulatory Visit: Payer: Self-pay | Admitting: *Deleted

## 2017-03-02 MED ORDER — METFORMIN HCL 500 MG PO TABS: ORAL_TABLET | ORAL | 3 refills | Status: DC

## 2017-03-02 NOTE — Telephone Encounter (Signed)
[employer]

## 2017-03-14 DIAGNOSIS — C44321 Squamous cell carcinoma of skin of nose: Secondary | ICD-10-CM | POA: Diagnosis not present

## 2017-03-29 DIAGNOSIS — N359 Urethral stricture, unspecified: Secondary | ICD-10-CM | POA: Diagnosis not present

## 2017-03-29 DIAGNOSIS — N320 Bladder-neck obstruction: Secondary | ICD-10-CM | POA: Diagnosis not present

## 2017-03-29 DIAGNOSIS — N135 Crossing vessel and stricture of ureter without hydronephrosis: Secondary | ICD-10-CM | POA: Diagnosis not present

## 2017-03-29 DIAGNOSIS — Z466 Encounter for fitting and adjustment of urinary device: Secondary | ICD-10-CM | POA: Diagnosis not present

## 2017-03-29 DIAGNOSIS — Z8546 Personal history of malignant neoplasm of prostate: Secondary | ICD-10-CM | POA: Diagnosis not present

## 2017-03-29 DIAGNOSIS — E119 Type 2 diabetes mellitus without complications: Secondary | ICD-10-CM | POA: Diagnosis not present

## 2017-03-29 DIAGNOSIS — N358 Other urethral stricture: Secondary | ICD-10-CM | POA: Diagnosis not present

## 2017-03-29 DIAGNOSIS — Z7984 Long term (current) use of oral hypoglycemic drugs: Secondary | ICD-10-CM | POA: Diagnosis not present

## 2017-03-29 DIAGNOSIS — C7982 Secondary malignant neoplasm of genital organs: Secondary | ICD-10-CM | POA: Diagnosis not present

## 2017-04-25 ENCOUNTER — Other Ambulatory Visit: Payer: Self-pay | Admitting: Internal Medicine

## 2017-04-26 ENCOUNTER — Other Ambulatory Visit: Payer: Self-pay | Admitting: Internal Medicine

## 2017-04-26 NOTE — Telephone Encounter (Signed)
Patient requested refill

## 2017-05-02 ENCOUNTER — Other Ambulatory Visit: Payer: Self-pay | Admitting: Internal Medicine

## 2017-05-02 DIAGNOSIS — I10 Essential (primary) hypertension: Secondary | ICD-10-CM

## 2017-05-02 DIAGNOSIS — L821 Other seborrheic keratosis: Secondary | ICD-10-CM | POA: Diagnosis not present

## 2017-05-02 DIAGNOSIS — I517 Cardiomegaly: Secondary | ICD-10-CM

## 2017-05-02 DIAGNOSIS — Z85828 Personal history of other malignant neoplasm of skin: Secondary | ICD-10-CM | POA: Diagnosis not present

## 2017-06-06 ENCOUNTER — Other Ambulatory Visit: Payer: Medicare Other

## 2017-06-06 DIAGNOSIS — E0821 Diabetes mellitus due to underlying condition with diabetic nephropathy: Secondary | ICD-10-CM | POA: Diagnosis not present

## 2017-06-06 DIAGNOSIS — E782 Mixed hyperlipidemia: Secondary | ICD-10-CM

## 2017-06-06 LAB — LIPID PANEL
CHOLESTEROL: 167 mg/dL (ref <200)
HDL: 42 mg/dL (ref >40)
LDL Cholesterol: 108 mg/dL — ABNORMAL HIGH (ref <100)
TRIGLYCERIDES: 85 mg/dL (ref <150)
Total CHOL/HDL Ratio: 4 Ratio (ref <5.0)
VLDL: 17 mg/dL (ref <30)

## 2017-06-07 LAB — HEMOGLOBIN A1C
HEMOGLOBIN A1C: 5.7 % — AB (ref <5.7)
Mean Plasma Glucose: 117 mg/dL

## 2017-06-09 DIAGNOSIS — Z7982 Long term (current) use of aspirin: Secondary | ICD-10-CM | POA: Diagnosis not present

## 2017-06-09 DIAGNOSIS — Z79899 Other long term (current) drug therapy: Secondary | ICD-10-CM | POA: Diagnosis not present

## 2017-06-09 DIAGNOSIS — E1122 Type 2 diabetes mellitus with diabetic chronic kidney disease: Secondary | ICD-10-CM | POA: Diagnosis not present

## 2017-06-09 DIAGNOSIS — R2681 Unsteadiness on feet: Secondary | ICD-10-CM | POA: Diagnosis not present

## 2017-06-09 DIAGNOSIS — Z7984 Long term (current) use of oral hypoglycemic drugs: Secondary | ICD-10-CM | POA: Diagnosis not present

## 2017-06-09 DIAGNOSIS — I129 Hypertensive chronic kidney disease with stage 1 through stage 4 chronic kidney disease, or unspecified chronic kidney disease: Secondary | ICD-10-CM | POA: Diagnosis not present

## 2017-06-09 DIAGNOSIS — C7951 Secondary malignant neoplasm of bone: Secondary | ICD-10-CM | POA: Diagnosis not present

## 2017-06-09 DIAGNOSIS — N183 Chronic kidney disease, stage 3 (moderate): Secondary | ICD-10-CM | POA: Diagnosis not present

## 2017-06-09 DIAGNOSIS — Z888 Allergy status to other drugs, medicaments and biological substances status: Secondary | ICD-10-CM | POA: Diagnosis not present

## 2017-06-09 DIAGNOSIS — C61 Malignant neoplasm of prostate: Secondary | ICD-10-CM | POA: Diagnosis not present

## 2017-06-10 ENCOUNTER — Encounter: Payer: Self-pay | Admitting: Internal Medicine

## 2017-06-10 ENCOUNTER — Ambulatory Visit (INDEPENDENT_AMBULATORY_CARE_PROVIDER_SITE_OTHER): Payer: Medicare Other | Admitting: Internal Medicine

## 2017-06-10 VITALS — BP 162/80 | HR 60 | Temp 98.0°F | Ht 71.0 in | Wt 266.0 lb

## 2017-06-10 DIAGNOSIS — G609 Hereditary and idiopathic neuropathy, unspecified: Secondary | ICD-10-CM

## 2017-06-10 DIAGNOSIS — R600 Localized edema: Secondary | ICD-10-CM

## 2017-06-10 DIAGNOSIS — I059 Rheumatic mitral valve disease, unspecified: Secondary | ICD-10-CM | POA: Diagnosis not present

## 2017-06-10 DIAGNOSIS — I3481 Nonrheumatic mitral (valve) annulus calcification: Secondary | ICD-10-CM

## 2017-06-10 DIAGNOSIS — E782 Mixed hyperlipidemia: Secondary | ICD-10-CM | POA: Diagnosis not present

## 2017-06-10 DIAGNOSIS — J439 Emphysema, unspecified: Secondary | ICD-10-CM

## 2017-06-10 DIAGNOSIS — M150 Primary generalized (osteo)arthritis: Secondary | ICD-10-CM | POA: Diagnosis not present

## 2017-06-10 DIAGNOSIS — M159 Polyosteoarthritis, unspecified: Secondary | ICD-10-CM

## 2017-06-10 DIAGNOSIS — E0821 Diabetes mellitus due to underlying condition with diabetic nephropathy: Secondary | ICD-10-CM | POA: Diagnosis not present

## 2017-06-10 DIAGNOSIS — I10 Essential (primary) hypertension: Secondary | ICD-10-CM | POA: Diagnosis not present

## 2017-06-10 NOTE — Progress Notes (Signed)
Patient ID: Jeremiah Collins, male   DOB: January 09, 1934, 83 y.o.   MRN: [id number]    Location:  [facility] Place of Service: OFFICE  Chief Complaint  Patient presents with  . Medical Management of Chronic Issues    5 months routine visit    HPI:  83 yo male seen today for f/u. He has gained 4 lbs since last OV. He has an appt with neurology next week to evaluate unsteady gait/worsening balance  OD Hollenhorst plaque - he had sx x 2 as the 1st procedure was ineffective. Vision improved after 2nd sx. He is followed by Dr Judd Lien (ophthamology)  mitral annular calcification - 2 D echo revealed mild AS and calcified mass on posterior leaflet of mitral valve with severe LVH. nml heart fxn with nml EF. He has SOB. No CP or dizziness. No leg swelling. He saw cardio and determined to have mitral annular calcification but no sx recommended at this time. Cardio recommended increase in statin tx but pt declines  hx prostate CA and rec'd XRT for bone mets (left tibia and pelvis) - He is currently on leuprolide injection q33mos. He also takes cardura. PSA stable on Xtandi. He went for surveillance CT chest/abd/pelvis and whole body bone scan at [facility]. No changes compared to previous studies. Followed by urology Dr Rosana Hoes and oncology Dr Marcello Moores. LDH 121; PSA <0.01. He had ureteral stent change in May 2018 with Dr Rosana Hoes. He has difficulty starting urine stream and some times needs to self cath. Albumin 3.2; Hgb 12.8  DM - BS 120s usually. He checks once weekly. No low BS reactions. No tingling. He takes metformin. A1c 5.7%. Cr 1.21. Last eye exam in 07/2016. No diabetic changes -Followed by Dr Delman Cheadle. He has noticed numbness in feet since beginning Xtandi.  HTN - BP stable on lisinopril-HCT and metoprolol. Takes ASA daily  Hyperlipidemia - borderline controlled on low dose simvastatin. Occasional muscle weakness. LDL 108  Multiple joint pain - mostly in back. He does not take any meds  Hx anemia - Hgb  12.8; Ferritin 30  CKD - stage 2. Cr 1.21    Past Medical History:  Diagnosis Date  . Acute bronchitis 09/12/2012  . 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 Bone metastases ([place])    left mid tibial shaft  . Cataract    Dr.Groat  . Cellulitis and abscess of leg, except foot 01/17/2012  . Chest pain, unspecified 04/28/2004  . Closed fracture of five ribs 03/24/2004  . First degree atrioventricular block 04/25/2007  . Gross hematuria 09/06/2011  . History of radiation therapy 04/03/14- 04/17/14   mid to distal left tibia 3000 cGy in 10 sessions  . HTN (hypertension)   . Hx of radiation therapy 11/1998   prostate fossa - 6040 cGy, 33 fx, Dr Danny Lawless  . Osteoarthrosis involving, or with mention of more than one site, but not specified as generalized, multiple sites 10/22/2010  . Other abnormal blood chemistry 04/29/1991  . Other and unspecified hyperlipidemia 01/02/2013  . Palpitations 04/28/2004  . Prostate cancer ([place]) 12/16/1994   gleason 7  . Reflux esophagitis 05/10/2008  . Routine general medical examination at a health care facility   . Spinal stenosis, unspecified region other than cervical 04/29/1995  . Type II or unspecified type diabetes mellitus without mention of complication, uncontrolled     Past Surgical History:  Procedure Laterality Date  . AIR/FLUID EXCHANGE Left 01/09/2016   Procedure: AIR/FLUID EXCHANGE;  Surgeon: Jalene Mullet, MD;  Location: [facility]  OR;  Service: Ophthalmology;  Laterality: Left;  . CYSTOSCOPY W/ URETERAL STENT PLACEMENT  12/31/14  . CYSTOSCOPY W/ URETERAL STENT PLACEMENT  05/16/2015  . EYE SURGERY  2006   cataract, Dr Shanon Rosser  . LASER PHOTO ABLATION Left 01/09/2016   Procedure: LASER PHOTO ABLATION;  Surgeon: Jalene Mullet, MD;  Location: [facility];  Service: Ophthalmology;  Laterality: Left;  Endolaser  . Left ureteral stent placement  Week of 12/05/11  . PARS PLANA VITRECTOMY Left 01/09/2016   Procedure: PARS PLANA VITRECTOMY WITH 25 GAUGE LEFT EYE ;  Surgeon:  Jalene Mullet, MD;  Location: [place];  Service: Ophthalmology;  Laterality: Left;  . PERFLUORONE INJECTION Left 01/09/2016   Procedure: PERFLUORONE INJECTION;  Surgeon: Jalene Mullet, MD;  Location: [place];  Service: Ophthalmology;  Laterality: Left;  . PROSTATECTOMY  1996   Dr. Rosana Hoes    Patient Care Team: [name], DO as PCP - General (Internal Medicine) Myrlene Broker, MD as Attending Physician (Urology) [name], MD as Consulting Physician (Ophthalmology) [name], MD as Consulting Physician (Ophthalmology)  Social History   Social History  . Marital status: Married    Spouse name: N/A  . Number of children: N/A  . Years of education: N/A   Occupational History  . Not on file.   Social History Main Topics  . Smoking status: Former Smoker    Types: Cigars    Quit date: 12/16/1968  . Smokeless tobacco: Never Used  . Alcohol use No  . Drug use: No  . Sexual activity: No   Other Topics Concern  . Not on file   Social History Narrative  . No narrative on file     reports that he quit smoking about 48 years ago. His smoking use included Cigars. He has never used smokeless tobacco. He reports that he does not drink alcohol or use drugs.  Family History  Problem Relation Age of Onset  . Heart disease Father   . Stroke Sister    Family Status  Relation Status  . Father Deceased  . Mother Deceased  . Sister Deceased  . Sister Deceased  . Sister Deceased     Allergies  Allergen Reactions  . Abiraterone Other (See Comments) and Nausea And Vomiting    Other reaction(s): Increased Heart Rate (intolerance) Other reaction(s): Increased Heart Rate (intolerance) Other reaction(s): Increased Heart Rate (intolerance)    Medications: Patient's Medications  New Prescriptions   No medications on file  Previous Medications   ASPIRIN 81 MG TABLET    Take 1 tablet (81 mg total) by mouth daily.   CHOLECALCIFEROL 1000 UNITS TABLET    Take 1,000 Units  by mouth daily.   DOXAZOSIN (CARDURA) 4 MG TABLET    TAKE ONE TABLET BY MOUTH ONCE DAILY   ENZALUTAMIDE (XTANDI) 40 MG CAPSULE    Take 40 mg by mouth daily. Take 4 per day   GLUCOSE BLOOD (ONETOUCH VERIO) TEST STRIP    Check blood sugar one to two times a week. Dx:E11.29   LEUPROLIDE, 6 MONTH, (LEUPROLIDE ACETATE, 6 MONTH,) 45 MG INJECTION    Inject 45 mg into the skin every 6 (six) months. Filled by [facility] (PRINZIDE,ZESTORETIC) 20-12.5 MG TABLET    Take 2 tabs po in AM and 1 tab po in PM for blood pressure   METFORMIN (GLUCOPHAGE) 500 MG TABLET    Take one tablet by mouth once daily with breakfast to control blood sugar   METOPROLOL TARTRATE (  LOPRESSOR) 50 MG TABLET    TAKE ONE TABLET BY MOUTH ONCE DAILY   SIMVASTATIN (ZOCOR) 5 MG TABLET    TAKE ONE TABLET BY MOUTH ONCE DAILY   TRAMADOL (ULTRAM) 50 MG TABLET    Take 50 mg by mouth.  Modified Medications   No medications on file  Discontinued Medications   No medications on file    Review of Systems  Eyes: Positive for visual disturbance.  Respiratory: Negative for shortness of breath.   Genitourinary: Positive for difficulty urinating.  Musculoskeletal: Positive for arthralgias, back pain and gait problem.  All other systems reviewed and are negative.   Vitals:   06/10/17 0922  BP: (!) 162/80  Pulse: 60  Temp: 98 F (36.7 C)  TempSrc: Oral  SpO2: 96%  Weight: 266 lb (120.7 kg)  Height: 5\' 11"  (1.803 m)   Body mass index is 37.1 kg/m.  Physical Exam  Constitutional: He is oriented to person, place, and time. He appears well-developed and well-nourished. No distress.  HENT:  Mouth/Throat: Oropharynx is clear and moist. No oropharyngeal exudate.  Eyes: Pupils are equal, round, and reactive to light. No scleral icterus.  Neck: Neck supple. No thyromegaly present.  Cardiovascular: Normal rate, regular rhythm and intact distal pulses.  Exam reveals no gallop, no distant heart sounds and  no friction rub.   Murmur (2/6 SEM) heard.  Systolic murmur is present with a grade of 2/6  +1 pitting LE edema b/l. No calf TTP  Pulmonary/Chest: Effort normal and breath sounds normal. No respiratory distress. He has no wheezes. He has no rales. He exhibits no tenderness.  Abdominal: Soft. Bowel sounds are normal. He exhibits no distension and no mass. There is no tenderness. There is no rebound and no guarding.  Musculoskeletal: He exhibits edema.  Lymphadenopathy:    He has no cervical adenopathy.  Neurological: He is alert and oriented to person, place, and time.  Unsteady gait  Skin: Skin is warm and dry. No rash noted. No erythema.  Psychiatric: He has a normal mood and affect. His behavior is normal. Thought content normal.   Diabetic Foot Exam - Simple   Simple Foot Form Diabetic Foot exam was performed with the following findings:  Yes 06/10/2017 10:22 AM  Visual Inspection Sensation Testing Pulse Check Posterior Tibialis and Dorsalis pulse intact bilaterally:  Yes Comments Pt declined removal of socks and shoes      Labs reviewed: Appointment on 06/06/2017  Component Date Value Ref Range Status  . Hgb A1c MFr Bld 06/06/2017 5.7* <5.7 % Final   Comment:   For someone without known diabetes, a hemoglobin A1c value between 5.7% and 6.4% is consistent with prediabetes and should be confirmed with a follow-up test.   For someone with known diabetes, a value <7% indicates that their diabetes is well controlled. A1c targets should be individualized based on duration of diabetes, age, co-morbid conditions and other considerations.   This assay result is consistent with an increased risk of diabetes.   Currently, no consensus exists regarding use of hemoglobin A1c for diagnosis of diabetes in children.     . Mean Plasma Glucose 06/06/2017 117  mg/dL Final  . Cholesterol 06/06/2017 167  <200 mg/dL Final  . Triglycerides 06/06/2017 85  <150 mg/dL Final  . HDL  06/06/2017 42  >40 mg/dL Final  . Total CHOL/HDL Ratio 06/06/2017 4.0  <5.0 Ratio Final  . VLDL 06/06/2017 17  <30 mg/dL Final  . LDL Cholesterol 06/06/2017 108* <100  mg/dL Final    No results found.   Assessment/Plan   ICD-10-CM   1. Idiopathic peripheral neuropathy G60.9    med induced vs diabetic  2. Mixed hyperlipidemia E78.2 Lipid Panel  3. Bilateral edema of lower extremity R60.0   4. Primary osteoarthritis involving multiple joints M15.0   5. Diabetes mellitus due to underlying condition, controlled, with diabetic nephropathy, without long-term current use of insulin (HCC) E08.21 Hemoglobin A1c  6. Essential hypertension, benign I10   7. Mitral valve annular calcification I05.9   8. Pulmonary emphysema, unspecified emphysema type ([place]) J43.9     He prefers to have diabetic foot exam by neurologist next week  Continue current medications as ordered  Follow up with specialists as scheduled  Follow up in 5 mos for DM, HTN, hyperlipidemia, CKD.  Karilyn Wind S. Perlie Gold  [facility] [address] [place],  [address] [phone] Cell (Monday-Friday 8 AM - 5 PM) [phone] After 5 PM and follow prompts

## 2017-06-10 NOTE — Patient Instructions (Signed)
Continue current medications as ordered  Follow up with specialists as scheduled  Follow up in 5 mos for DM, HTN, hyperlipidemia, CKD.   Diabetic Neuropathy Diabetic neuropathy is a nerve disease or nerve damage that is caused by diabetes mellitus. About half of all people with diabetes mellitus have some form of nerve damage. Nerve damage is more common in those who have had diabetes mellitus for many years and who generally have not had good control of their blood sugar (glucose) level. Diabetic neuropathy is a common complication of diabetes mellitus. There are three common types of diabetic neuropathy and a fourth type that is less common and less understood:  Peripheral neuropathy-This is the most common type of diabetic neuropathy. It causes damage to the nerves of the feet and legs first and then eventually the hands and arms. The damage affects the ability to sense touch.  Autonomic neuropathy-This type causes damage to the autonomic nervous system, which controls the following functions: ? Heartbeat. ? Body temperature. ? Blood pressure. ? Urination. ? Digestion. ? Sweating. ? Sexual function.  Focal neuropathy-Focal neuropathy can be painful and unpredictable and occurs most often in older adults with diabetes mellitus. It involves a specific nerve or one area and often comes on suddenly. It usually does not cause long-term problems.  Radiculoplexus neuropathy- Sometimes called lumbosacral radiculoplexus neuropathy, radiculoplexus neuropathy affects the nerves of the thighs, hips, buttocks, or legs. It is more common in people with type 2 diabetes mellitus and in older men. It is characterized by debilitating pain, weakness, and atrophy, usually in the thigh muscles.  What are the causes? The cause of peripheral, autonomic, and focal neuropathies is diabetes mellitus that is uncontrolled and high glucose levels. The cause of radiculoplexus neuropathy is unknown. However, it is  thought to be caused by inflammation related to uncontrolled glucose levels. What are the signs or symptoms? Peripheral Neuropathy Peripheral neuropathy develops slowly over time. When the nerves of the feet and legs no longer work there may be:  Burning, stabbing, or aching pain in the legs or feet.  Inability to feel pressure or pain in your feet. This can lead to: ? Thick calluses over pressure areas. ? Pressure sores. ? Ulcers.  Foot deformities.  Reduced ability to feel temperature changes.  Muscle weakness.  Autonomic Neuropathy The symptoms of autonomic neuropathy vary depending on which nerves are affected. Symptoms may include:  Problems with digestion, such as: ? Feeling sick to your stomach (nausea). ? Vomiting. ? Bloating. ? Constipation. ? Diarrhea. ? Abdominal pain.  Difficulty with urination. This occurs if you lose your ability to sense when your bladder is full. Problems include: ? Urine leakage (incontinence). ? Inability to empty your bladder completely (retention).  Rapid or irregular heartbeat (palpitations).  Blood pressure drops when you stand up (orthostatic hypotension). When you stand up you may feel: ? Dizzy. ? Weak. ? Faint.  In men, inability to attain and maintain an erection.  In women, vaginal dryness and problems with decreased sexual desire and arousal.  Problems with body temperature regulation.  Increased or decreased sweating.  Focal Neuropathy  Abnormal eye movements or abnormal alignment of both eyes.  Weakness in the wrist.  Foot drop. This results in an inability to lift the foot properly and abnormal walking or foot movement.  Paralysis on one side of your face (Bell palsy).  Chest or abdominal pain. Radiculoplexus Neuropathy  Sudden, severe pain in your hip, thigh, or buttocks.  Weakness and wasting of  thigh muscles.  Difficulty rising from a seated position.  Abdominal swelling.  Unexplained weight loss  (usually more than 10 lb [4.5 kg]). How is this diagnosed? Peripheral Neuropathy Your senses may be tested. Sensory function testing can be done with:  A light touch using a monofilament.  A vibration with tuning fork.  A sharp sensation with a pin prick.  Other tests that can help diagnose neuropathy are:  Nerve conduction velocity. This test checks the transmission of an electrical current through a nerve.  Electromyography. This shows how muscles respond to electrical signals transmitted by nearby nerves.  Quantitative sensory testing. This is used to assess how your nerves respond to vibrations and changes in temperature.  Autonomic Neuropathy Diagnosis is often based on reported symptoms. Tell your health care provider if you experience:  Dizziness.  Constipation.  Diarrhea.  Inappropriate urination or inability to urinate.  Inability to get or maintain an erection.  Tests that may be done include:  Electrocardiography or Holter monitor. These are tests that can help show problems with the heart rate or heart rhythm.  An X-ray exam may be done.  Focal Neuropathy Diagnosis is made based on your symptoms and what your health care provider finds during your exam. Other tests may be done. They may include:  Nerve conduction velocities. This checks the transmission of electrical current through a nerve.  Electromyography. This shows how muscles respond to electrical signals transmitted by nearby nerves.  Quantitative sensory testing. This test is used to assess how your nerves respond to vibration and changes in temperature.  Radiculoplexus Neuropathy  Often the first thing is to eliminate any other issue or problems that might be the cause, as there is no standard test for diagnosis.  X-ray exam of your spine and lumbar region.  Spinal tap to rule out cancer.  MRI to rule out other lesions. How is this treated? Once nerve damage occurs, it cannot be reversed.  The goal of treatment is to keep the disease or nerve damage from getting worse and affecting more nerve fibers. Controlling your blood glucose level is the key. Most people with radiculoplexus neuropathy see at least a partial improvement over time. You will need to keep your blood glucose and HbA1c levels in the target range determined by your health care provider. Things that help control blood glucose levels include:  Blood glucose monitoring.  Meal planning.  Physical activity.  Diabetes medicine.  Over time, maintaining lower blood glucose levels helps lessen symptoms. Sometimes, prescription pain medicine is needed. Follow these instructions at home:  Do not smoke.  Keep your blood glucose level in the range that you and your health care provider have determined acceptable for you.  Keep your blood pressure level in the range that you and your health care provider have determined acceptable for you.  Eat a well-balanced diet.  Be physically active every day. Include strength training and balance exercises.  Protect your feet. ? Check your feet every day for sores, cuts, blisters, or signs of infection. ? Wear padded socks and supportive shoes. Use orthotic inserts, if necessary. ? Regularly check the insides of your shoes for worn spots. Make sure there are no rocks or other items inside your shoes before you put them on. Contact a health care provider if:  You have burning, stabbing, or aching pain in the legs or feet.  You are unable to feel pressure or pain in your feet.  You develop problems with digestion such as: ?  Nausea. ? Vomiting. ? Bloating. ? Constipation. ? Diarrhea. ? Abdominal pain.  You have difficulty with urination, such as: ? Incontinence. ? Retention.  You have palpitations.  You develop orthostatic hypotension. When you stand up you may feel: ? Dizzy. ? Weak. ? Faint.  You cannot attain and maintain an erection (in men).  You have  vaginal dryness and problems with decreased sexual desire and arousal (in women).  You have severe pain in your thighs, legs, or buttocks.  You have unexplained weight loss. This information is not intended to replace advice given to you by your health care provider. Make sure you discuss any questions you have with your health care provider. Document Released: 01/10/2002 Document Revised: 04/08/2016 Document Reviewed: 04/12/2013 Elsevier Interactive Patient Education  2017 [employer].

## 2017-06-27 DIAGNOSIS — Z961 Presence of intraocular lens: Secondary | ICD-10-CM | POA: Diagnosis not present

## 2017-06-27 DIAGNOSIS — H31092 Other chorioretinal scars, left eye: Secondary | ICD-10-CM | POA: Diagnosis not present

## 2017-06-27 DIAGNOSIS — H3509 Other intraretinal microvascular abnormalities: Secondary | ICD-10-CM | POA: Diagnosis not present

## 2017-06-27 DIAGNOSIS — H35372 Puckering of macula, left eye: Secondary | ICD-10-CM | POA: Diagnosis not present

## 2017-06-27 DIAGNOSIS — H35352 Cystoid macular degeneration, left eye: Secondary | ICD-10-CM | POA: Diagnosis not present

## 2017-06-27 DIAGNOSIS — H3562 Retinal hemorrhage, left eye: Secondary | ICD-10-CM | POA: Diagnosis not present

## 2017-06-28 DIAGNOSIS — Z8546 Personal history of malignant neoplasm of prostate: Secondary | ICD-10-CM | POA: Diagnosis not present

## 2017-06-28 DIAGNOSIS — E669 Obesity, unspecified: Secondary | ICD-10-CM | POA: Diagnosis not present

## 2017-06-28 DIAGNOSIS — Z466 Encounter for fitting and adjustment of urinary device: Secondary | ICD-10-CM | POA: Diagnosis not present

## 2017-06-28 DIAGNOSIS — Z7984 Long term (current) use of oral hypoglycemic drugs: Secondary | ICD-10-CM | POA: Diagnosis not present

## 2017-06-28 DIAGNOSIS — Z923 Personal history of irradiation: Secondary | ICD-10-CM | POA: Diagnosis not present

## 2017-06-28 DIAGNOSIS — Z888 Allergy status to other drugs, medicaments and biological substances status: Secondary | ICD-10-CM | POA: Diagnosis not present

## 2017-06-28 DIAGNOSIS — N131 Hydronephrosis with ureteral stricture, not elsewhere classified: Secondary | ICD-10-CM | POA: Diagnosis not present

## 2017-06-28 DIAGNOSIS — Z7982 Long term (current) use of aspirin: Secondary | ICD-10-CM | POA: Diagnosis not present

## 2017-06-28 DIAGNOSIS — K219 Gastro-esophageal reflux disease without esophagitis: Secondary | ICD-10-CM | POA: Diagnosis not present

## 2017-06-28 DIAGNOSIS — N359 Urethral stricture, unspecified: Secondary | ICD-10-CM | POA: Diagnosis not present

## 2017-06-28 DIAGNOSIS — N183 Chronic kidney disease, stage 3 (moderate): Secondary | ICD-10-CM | POA: Diagnosis not present

## 2017-06-28 DIAGNOSIS — I350 Nonrheumatic aortic (valve) stenosis: Secondary | ICD-10-CM | POA: Diagnosis not present

## 2017-06-28 DIAGNOSIS — R011 Cardiac murmur, unspecified: Secondary | ICD-10-CM | POA: Diagnosis not present

## 2017-06-28 DIAGNOSIS — Z6837 Body mass index (BMI) 37.0-37.9, adult: Secondary | ICD-10-CM | POA: Diagnosis not present

## 2017-06-28 DIAGNOSIS — E1122 Type 2 diabetes mellitus with diabetic chronic kidney disease: Secondary | ICD-10-CM | POA: Diagnosis not present

## 2017-06-28 DIAGNOSIS — Z79891 Long term (current) use of opiate analgesic: Secondary | ICD-10-CM | POA: Diagnosis not present

## 2017-06-28 DIAGNOSIS — N320 Bladder-neck obstruction: Secondary | ICD-10-CM | POA: Diagnosis not present

## 2017-06-28 DIAGNOSIS — M1990 Unspecified osteoarthritis, unspecified site: Secondary | ICD-10-CM | POA: Diagnosis not present

## 2017-06-28 DIAGNOSIS — E785 Hyperlipidemia, unspecified: Secondary | ICD-10-CM | POA: Diagnosis not present

## 2017-06-28 DIAGNOSIS — N135 Crossing vessel and stricture of ureter without hydronephrosis: Secondary | ICD-10-CM | POA: Diagnosis not present

## 2017-06-28 DIAGNOSIS — Z87891 Personal history of nicotine dependence: Secondary | ICD-10-CM | POA: Diagnosis not present

## 2017-06-28 DIAGNOSIS — Z79899 Other long term (current) drug therapy: Secondary | ICD-10-CM | POA: Diagnosis not present

## 2017-06-28 DIAGNOSIS — Z85830 Personal history of malignant neoplasm of bone: Secondary | ICD-10-CM | POA: Diagnosis not present

## 2017-06-28 DIAGNOSIS — I129 Hypertensive chronic kidney disease with stage 1 through stage 4 chronic kidney disease, or unspecified chronic kidney disease: Secondary | ICD-10-CM | POA: Diagnosis not present

## 2017-06-30 DIAGNOSIS — R609 Edema, unspecified: Secondary | ICD-10-CM | POA: Diagnosis not present

## 2017-06-30 DIAGNOSIS — H47012 Ischemic optic neuropathy, left eye: Secondary | ICD-10-CM | POA: Diagnosis not present

## 2017-07-04 DIAGNOSIS — H35352 Cystoid macular degeneration, left eye: Secondary | ICD-10-CM | POA: Diagnosis not present

## 2017-07-04 DIAGNOSIS — H30003 Unspecified focal chorioretinal inflammation, bilateral: Secondary | ICD-10-CM | POA: Diagnosis not present

## 2017-07-04 DIAGNOSIS — H3509 Other intraretinal microvascular abnormalities: Secondary | ICD-10-CM | POA: Diagnosis not present

## 2017-07-04 DIAGNOSIS — R270 Ataxia, unspecified: Secondary | ICD-10-CM | POA: Diagnosis not present

## 2017-07-04 DIAGNOSIS — G609 Hereditary and idiopathic neuropathy, unspecified: Secondary | ICD-10-CM | POA: Diagnosis not present

## 2017-07-04 DIAGNOSIS — R2681 Unsteadiness on feet: Secondary | ICD-10-CM | POA: Diagnosis not present

## 2017-07-04 DIAGNOSIS — H3562 Retinal hemorrhage, left eye: Secondary | ICD-10-CM | POA: Diagnosis not present

## 2017-07-05 DIAGNOSIS — H53432 Sector or arcuate defects, left eye: Secondary | ICD-10-CM | POA: Diagnosis not present

## 2017-07-12 DIAGNOSIS — C61 Malignant neoplasm of prostate: Secondary | ICD-10-CM | POA: Diagnosis not present

## 2017-07-12 DIAGNOSIS — C7951 Secondary malignant neoplasm of bone: Secondary | ICD-10-CM | POA: Diagnosis not present

## 2017-07-12 DIAGNOSIS — M84462D Pathological fracture, left tibia, subsequent encounter for fracture with routine healing: Secondary | ICD-10-CM | POA: Diagnosis not present

## 2017-07-12 DIAGNOSIS — M84469S Pathological fracture, unspecified tibia and fibula, sequela: Secondary | ICD-10-CM | POA: Diagnosis not present

## 2017-07-12 DIAGNOSIS — M7752 Other enthesopathy of left foot: Secondary | ICD-10-CM | POA: Diagnosis not present

## 2017-07-12 DIAGNOSIS — I998 Other disorder of circulatory system: Secondary | ICD-10-CM | POA: Diagnosis not present

## 2017-07-12 DIAGNOSIS — M8580 Other specified disorders of bone density and structure, unspecified site: Secondary | ICD-10-CM | POA: Diagnosis not present

## 2017-07-19 ENCOUNTER — Other Ambulatory Visit: Payer: Self-pay

## 2017-07-19 DIAGNOSIS — G609 Hereditary and idiopathic neuropathy, unspecified: Secondary | ICD-10-CM | POA: Diagnosis not present

## 2017-07-19 DIAGNOSIS — I3481 Nonrheumatic mitral (valve) annulus calcification: Secondary | ICD-10-CM

## 2017-07-19 DIAGNOSIS — I700 Atherosclerosis of aorta: Secondary | ICD-10-CM

## 2017-07-19 DIAGNOSIS — I059 Rheumatic mitral valve disease, unspecified: Secondary | ICD-10-CM

## 2017-07-19 DIAGNOSIS — I358 Other nonrheumatic aortic valve disorders: Secondary | ICD-10-CM

## 2017-07-19 DIAGNOSIS — I6529 Occlusion and stenosis of unspecified carotid artery: Secondary | ICD-10-CM

## 2017-07-19 DIAGNOSIS — R7982 Elevated C-reactive protein (CRP): Secondary | ICD-10-CM

## 2017-07-25 DIAGNOSIS — G609 Hereditary and idiopathic neuropathy, unspecified: Secondary | ICD-10-CM | POA: Diagnosis not present

## 2017-07-25 DIAGNOSIS — R270 Ataxia, unspecified: Secondary | ICD-10-CM | POA: Diagnosis not present

## 2017-07-27 DIAGNOSIS — G609 Hereditary and idiopathic neuropathy, unspecified: Secondary | ICD-10-CM | POA: Diagnosis not present

## 2017-08-02 DIAGNOSIS — L570 Actinic keratosis: Secondary | ICD-10-CM | POA: Diagnosis not present

## 2017-08-02 DIAGNOSIS — L905 Scar conditions and fibrosis of skin: Secondary | ICD-10-CM | POA: Diagnosis not present

## 2017-08-02 DIAGNOSIS — Z85828 Personal history of other malignant neoplasm of skin: Secondary | ICD-10-CM | POA: Diagnosis not present

## 2017-08-15 DIAGNOSIS — R270 Ataxia, unspecified: Secondary | ICD-10-CM | POA: Diagnosis not present

## 2017-08-15 DIAGNOSIS — R209 Unspecified disturbances of skin sensation: Secondary | ICD-10-CM | POA: Diagnosis not present

## 2017-08-15 DIAGNOSIS — E1142 Type 2 diabetes mellitus with diabetic polyneuropathy: Secondary | ICD-10-CM | POA: Diagnosis not present

## 2017-09-02 ENCOUNTER — Other Ambulatory Visit: Payer: Self-pay | Admitting: Internal Medicine

## 2017-09-02 ENCOUNTER — Telehealth: Payer: Self-pay | Admitting: Internal Medicine

## 2017-09-02 NOTE — Telephone Encounter (Signed)
I called to confirm the patient's  AWV-S appointment but was told that the pt was unavailable.  The call was then disconnected. VDM (DD)

## 2017-09-05 ENCOUNTER — Ambulatory Visit (INDEPENDENT_AMBULATORY_CARE_PROVIDER_SITE_OTHER): Payer: Medicare Other

## 2017-09-05 VITALS — BP 148/76 | HR 63 | Temp 98.0°F | Ht 71.0 in | Wt 271.0 lb

## 2017-09-05 DIAGNOSIS — Z0000 Encounter for general adult medical examination without abnormal findings: Secondary | ICD-10-CM

## 2017-09-05 DIAGNOSIS — Z23 Encounter for immunization: Secondary | ICD-10-CM | POA: Diagnosis not present

## 2017-09-05 NOTE — Progress Notes (Signed)
Subjective:   Jeremiah Collins is a 83 y.o. male who presents for Medicare Annual/Subsequent preventive examination.   Last AWV-09/03/2016    Objective:    Vitals: BP (!) 148/76 (BP Location: Right Arm, Patient Position: Sitting)   Pulse 63   Temp 98 F (36.7 C) (Oral)   Ht 5\' 11"  (1.803 m)   Wt 271 lb (122.9 kg)   SpO2 98%   BMI 37.80 kg/m   Body mass index is 37.8 kg/m.  Tobacco History  Smoking Status  . Former Smoker  . Years: 5.00  . Types: Cigars  . Quit date: 12/16/1968  Smokeless Tobacco  . Never Used     Counseling given: Not Answered   Past Medical History:  Diagnosis Date  . Acute bronchitis 09/12/2012  . 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 Bone metastases ([place])    left mid tibial shaft  . Cataract    Dr.Groat  . Cellulitis and abscess of leg, except foot 01/17/2012  . Chest pain, unspecified 04/28/2004  . Closed fracture of five ribs 03/24/2004  . First degree atrioventricular block 04/25/2007  . Gross hematuria 09/06/2011  . History of radiation therapy 04/03/14- 04/17/14   mid to distal left tibia 3000 cGy in 10 sessions  . HTN (hypertension)   . Hx of radiation therapy 11/1998   prostate fossa - 6040 cGy, 33 fx, Dr Danny Lawless  . Osteoarthrosis involving, or with mention of more than one site, but not specified as generalized, multiple sites 10/22/2010  . Other abnormal blood chemistry 04/29/1991  . Other and unspecified hyperlipidemia 01/02/2013  . Palpitations 04/28/2004  . Prostate cancer ([place]) 12/16/1994   gleason 7  . Reflux esophagitis 05/10/2008  . Routine general medical examination at a health care facility   . Spinal stenosis, unspecified region other than cervical 04/29/1995  . Type II or unspecified type diabetes mellitus without mention of complication, uncontrolled    Past Surgical History:  Procedure Laterality Date  . AIR/FLUID EXCHANGE Left 01/09/2016   Procedure: AIR/FLUID EXCHANGE;  Surgeon: Jalene Mullet, MD;  Location: [place];   Service: Ophthalmology;  Laterality: Left;  . CYSTOSCOPY W/ URETERAL STENT PLACEMENT  12/31/14  . CYSTOSCOPY W/ URETERAL STENT PLACEMENT  05/16/2015  . EYE SURGERY  2006   cataract, Dr Shanon Rosser  . LASER PHOTO ABLATION Left 01/09/2016   Procedure: LASER PHOTO ABLATION;  Surgeon: Jalene Mullet, MD;  Location: [place];  Service: Ophthalmology;  Laterality: Left;  Endolaser  . Left ureteral stent placement  Week of 12/05/11  . PARS PLANA VITRECTOMY Left 01/09/2016   Procedure: PARS PLANA VITRECTOMY WITH 25 GAUGE LEFT EYE ;  Surgeon: Jalene Mullet, MD;  Location: [place];  Service: Ophthalmology;  Laterality: Left;  . PERFLUORONE INJECTION Left 01/09/2016   Procedure: PERFLUORONE INJECTION;  Surgeon: Jalene Mullet, MD;  Location: [place];  Service: Ophthalmology;  Laterality: Left;  . PROSTATECTOMY  1996   Dr. Rosana Hoes   Family History  Problem Relation Age of Onset  . Heart disease Father   . Stroke Sister    History  Sexual Activity  . Sexual activity: No    Outpatient Encounter Prescriptions as of 09/05/2017  Medication Sig  . aspirin 81 MG tablet Take 1 tablet (81 mg total) by mouth daily.  . Cholecalciferol 1000 UNITS tablet Take 1,000 Units by mouth daily.  Marland Kitchen doxazosin (CARDURA) 4 MG tablet TAKE 1 TABLET BY MOUTH ONCE DAILY  . enzalutamide (XTANDI) 40 MG capsule Take 40 mg by mouth daily. Take  4 per day  . glucose blood (ONETOUCH VERIO) test strip Check blood sugar one to two times a week. Dx:E11.29  . leuprolide, 6 Month, (LEUPROLIDE ACETATE, 6 MONTH,) 45 MG injection Inject 45 mg into the skin every 6 (six) months. Filled by [facility]  . lisinopril-hydrochlorothiazide (PRINZIDE,ZESTORETIC) 20-12.5 MG tablet Take 2 tabs po in AM and 1 tab po in PM for blood pressure  . metFORMIN (GLUCOPHAGE) 500 MG tablet Take one tablet by mouth once daily with breakfast to control blood sugar  . metoprolol tartrate (LOPRESSOR) 50 MG tablet TAKE ONE TABLET BY MOUTH ONCE DAILY  . simvastatin (ZOCOR) 5 MG  tablet TAKE 1 TABLET BY MOUTH ONCE DAILY  . traMADol (ULTRAM) 50 MG tablet Take 50 mg by mouth.   No facility-administered encounter medications on file as of 09/05/2017.     Activities of Daily Living In your present state of health, do you have any difficulty performing the following activities: 09/05/2017  Hearing? N  Vision? N  Difficulty concentrating or making decisions? Y  Walking or climbing stairs? N  Dressing or bathing? N  Doing errands, shopping? N  Preparing Food and eating ? N  Using the Toilet? N  In the past six months, have you accidently leaked urine? Y  Comment self catheter sometimes, has ureteal stent  Do you have problems with loss of bowel control? N  Managing your Medications? N  Managing your Finances? N  Housekeeping or managing your Housekeeping? N  Some recent data might be hidden    Patient Care Team: [name], DO as PCP - General (Internal Medicine) Myrlene Broker, MD as Attending Physician (Urology) [name], MD as Consulting Physician (Ophthalmology) [name], MD as Consulting Physician (Ophthalmology)   Assessment:     Exercise Activities and Dietary recommendations Current Exercise Habits: The patient does not participate in regular exercise at present, Exercise limited by: None identified  Goals    . Increase water intake          Starting 09/03/16, I will attempt to increase my water intake by 2 cups.     Nilda Simmer LIfestyle          Starting today pt will maintain lifestyle.       Fall Risk Fall Risk  09/05/2017 06/10/2017 01/07/2017 11/17/2016 09/03/2016  Falls in the past year? No No No No No   Depression Screen PHQ 2/9 Scores 09/05/2017 09/03/2016 04/23/2016 03/08/2016  PHQ - 2 Score 0 0 0 0    Cognitive Function MMSE - Mini Mental State Exam 09/05/2017 09/03/2016 09/10/2014  Orientation to time 5 5 5   Orientation to Place 5 5 5   Registration 3 3 3   Attention/ Calculation 4 5 5   Recall 1 3 3     Language- name 2 objects 2 2 2   Language- repeat 1 1 1   Language- follow 3 step command 3 3 3   Language- read & follow direction 1 1 1   Write a sentence 1 0 1  Copy design 1 1 1   Total score 27 29 30         Immunization History  Administered Date(s) Administered  . Influenza Whole 08/13/2010, 08/16/2011  . Influenza,inj,Quad PF,6+ Mos 07/18/2013, 08/20/2015  . Influenza-Unspecified 09/01/2016  . Pneumococcal Conjugate-13 01/14/2015  . Pneumococcal Polysaccharide-23 09/06/2011  . Tdap 11/07/2013   Screening Tests Health Maintenance  Topic Date Due  . INFLUENZA VACCINE  06/15/2017  . OPHTHALMOLOGY EXAM  07/28/2017  . HEMOGLOBIN A1C  12/07/2017  .  FOOT EXAM  06/10/2018  . TETANUS/TDAP  11/08/2023  . PNA vac Low Risk Adult  Completed      Plan:    I have personally reviewed and addressed the Medicare Annual Wellness questionnaire and have noted the following in the patient's chart:  A. Medical and social history B. Use of alcohol, tobacco or illicit drugs  C. Current medications and supplements D. Functional ability and status E.  Nutritional status F.  Physical activity G. Advance directives H. List of other physicians I.  Hospitalizations, surgeries, and ER visits in previous 12 months J.  [place] to include hearing, vision, cognitive, depression L. Referrals and appointments - none  In addition, I have reviewed and discussed with patient certain preventive protocols, quality metrics, and best practice recommendations. A written personalized care plan for preventive services as well as general preventive health recommendations were provided to patient.  See attached scanned questionnaire for additional information.   Signed,   Rich Reining, RN Nurse Health Advisor   Quick Notes   Health Maintenance: Flu shot given today. shingrix declined.      Abnormal Screen: BP 148/76, MMSE 27/30 passed clock drawing     Patient Concerns:  None     Nurse Concerns: None

## 2017-09-05 NOTE — Patient Instructions (Signed)
Jeremiah Collins , Thank you for taking time to come for your Medicare Wellness Visit. I appreciate your ongoing commitment to your health goals. Please review the following plan we discussed and let me know if I can assist you in the future.   Screening recommendations/referrals: Colonoscopy up to date, you are over age 81 Recommended yearly ophthalmology/optometry visit for glaucoma screening and checkup Recommended yearly dental visit for hygiene and checkup  Vaccinations: Influenza vaccine given today Pneumococcal vaccine up to date Tdap vaccine up to date. Due 11/08/2023 Shingles vaccine due    Advanced directives: living will and health care power of attorney is needed for your chart, if you change your mind and want the paperwork please let us know  Conditions/risks identified: None  Next appointment: Dr. Eulas Post 11/11/2017 @ 9:15am  Preventive Care 65 Years and Older, Male Preventive care refers to lifestyle choices and visits with your health care provider that can promote health and wellness. What does preventive care include?  A yearly physical exam. This is also called an annual well check.  Dental exams once or twice a year.  Routine eye exams. Ask your health care provider how often you should have your eyes checked.  Personal lifestyle choices, including:  Daily care of your teeth and gums.  Regular physical activity.  Eating a healthy diet.  Avoiding tobacco and drug use.  Limiting alcohol use.  Practicing safe sex.  Taking low doses of aspirin every day.  Taking vitamin and mineral supplements as recommended by your health care provider. What happens during an annual well check? The services and screenings done by your health care provider during your annual well check will depend on your age, overall health, lifestyle risk factors, and family history of disease. Counseling  Your health care provider may ask you questions about your:  Alcohol  use.  Tobacco use.  Drug use.  Emotional well-being.  Home and relationship well-being.  Sexual activity.  Eating habits.  History of falls.  Memory and ability to understand (cognition).  Work and work Statistician. Screening  You may have the following tests or measurements:  Height, weight, and BMI.  Blood pressure.  Lipid and cholesterol levels. These may be checked every 5 years, or more frequently if you are over 52 years old.  Skin check.  Lung cancer screening. You may have this screening every year starting at age 58 if you have a 30-pack-year history of smoking and currently smoke or have quit within the past 15 years.  Fecal occult blood test (FOBT) of the stool. You may have this test every year starting at age 65.  Flexible sigmoidoscopy or colonoscopy. You may have a sigmoidoscopy every 5 years or a colonoscopy every 10 years starting at age 16.  Prostate cancer screening. Recommendations will vary depending on your family history and other risks.  Hepatitis C blood test.  Hepatitis B blood test.  Sexually transmitted disease (STD) testing.  Diabetes screening. This is done by checking your blood sugar (glucose) after you have not eaten for a while (fasting). You may have this done every 1-3 years.  Abdominal aortic aneurysm (AAA) screening. You may need this if you are a current or former smoker.  Osteoporosis. You may be screened starting at age 34 if you are at high risk. Talk with your health care provider about your test results, treatment options, and if necessary, the need for more tests. Vaccines  Your health care provider may recommend certain vaccines, such as:  Influenza vaccine. This is recommended every year.  Tetanus, diphtheria, and acellular pertussis (Tdap, Td) vaccine. You may need a Td booster every 10 years.  Zoster vaccine. You may need this after age 19.  Pneumococcal 13-valent conjugate (PCV13) vaccine. One dose is  recommended after age 32.  Pneumococcal polysaccharide (PPSV23) vaccine. One dose is recommended after age 51. Talk to your health care provider about which screenings and vaccines you need and how often you need them. This information is not intended to replace advice given to you by your health care provider. Make sure you discuss any questions you have with your health care provider. Document Released: 11/28/2015 Document Revised: 07/21/2016 Document Reviewed: 09/02/2015 Elsevier Interactive Patient Education  2017 [place] Prevention in the Home Falls can cause injuries. They can happen to people of all ages. There are many things you can do to make your home safe and to help prevent falls. What can I do on the outside of my home?  Regularly fix the edges of walkways and driveways and fix any cracks.  Remove anything that might make you trip as you walk through a door, such as a raised step or threshold.  Trim any bushes or trees on the path to your home.  Use bright outdoor lighting.  Clear any walking paths of anything that might make someone trip, such as rocks or tools.  Regularly check to see if handrails are loose or broken. Make sure that both sides of any steps have handrails.  Any raised decks and porches should have guardrails on the edges.  Have any leaves, snow, or ice cleared regularly.  Use sand or salt on walking paths during winter.  Clean up any spills in your garage right away. This includes oil or grease spills. What can I do in the bathroom?  Use night lights.  Install grab bars by the toilet and in the tub and shower. Do not use towel bars as grab bars.  Use non-skid mats or decals in the tub or shower.  If you need to sit down in the shower, use a plastic, non-slip stool.  Keep the floor dry. Clean up any water that spills on the floor as soon as it happens.  Remove soap buildup in the tub or shower regularly.  Attach bath mats  securely with double-sided non-slip rug tape.  Do not have throw rugs and other things on the floor that can make you trip. What can I do in the bedroom?  Use night lights.  Make sure that you have a light by your bed that is easy to reach.  Do not use any sheets or blankets that are too big for your bed. They should not hang down onto the floor.  Have a firm chair that has side arms. You can use this for support while you get dressed.  Do not have throw rugs and other things on the floor that can make you trip. What can I do in the kitchen?  Clean up any spills right away.  Avoid walking on wet floors.  Keep items that you use a lot in easy-to-reach places.  If you need to reach something above you, use a strong step stool that has a grab bar.  Keep electrical cords out of the way.  Do not use floor polish or wax that makes floors slippery. If you must use wax, use non-skid floor wax.  Do not have throw rugs and other things on the floor that  can make you trip. What can I do with my stairs?  Do not leave any items on the stairs.  Make sure that there are handrails on both sides of the stairs and use them. Fix handrails that are broken or loose. Make sure that handrails are as long as the stairways.  Check any carpeting to make sure that it is firmly attached to the stairs. Fix any carpet that is loose or worn.  Avoid having throw rugs at the top or bottom of the stairs. If you do have throw rugs, attach them to the floor with carpet tape.  Make sure that you have a light switch at the top of the stairs and the bottom of the stairs. If you do not have them, ask someone to add them for you. What else can I do to help prevent falls?  Wear shoes that:  Do not have high heels.  Have rubber bottoms.  Are comfortable and fit you well.  Are closed at the toe. Do not wear sandals.  If you use a stepladder:  Make sure that it is fully opened. Do not climb a closed  stepladder.  Make sure that both sides of the stepladder are locked into place.  Ask someone to hold it for you, if possible.  Clearly mark and make sure that you can see:  Any grab bars or handrails.  First and last steps.  Where the edge of each step is.  Use tools that help you move around (mobility aids) if they are needed. These include:  Canes.  Walkers.  Scooters.  Crutches.  Turn on the lights when you go into a dark area. Replace any light bulbs as soon as they burn out.  Set up your furniture so you have a clear path. Avoid moving your furniture around.  If any of your floors are uneven, fix them.  If there are any pets around you, be aware of where they are.  Review your medicines with your doctor. Some medicines can make you feel dizzy. This can increase your chance of falling. Ask your doctor what other things that you can do to help prevent falls. This information is not intended to replace advice given to you by your health care provider. Make sure you discuss any questions you have with your health care provider. Document Released: 08/28/2009 Document Revised: 04/08/2016 Document Reviewed: 12/06/2014 Elsevier Interactive Patient Education  2017 [employer].

## 2017-09-06 ENCOUNTER — Ambulatory Visit (INDEPENDENT_AMBULATORY_CARE_PROVIDER_SITE_OTHER): Payer: Medicare Other | Admitting: Nurse Practitioner

## 2017-09-06 ENCOUNTER — Encounter: Payer: Self-pay | Admitting: Nurse Practitioner

## 2017-09-06 VITALS — BP 136/74 | HR 66 | Ht 71.0 in | Wt 271.1 lb

## 2017-09-06 DIAGNOSIS — I6523 Occlusion and stenosis of bilateral carotid arteries: Secondary | ICD-10-CM

## 2017-09-06 DIAGNOSIS — E782 Mixed hyperlipidemia: Secondary | ICD-10-CM

## 2017-09-06 DIAGNOSIS — I10 Essential (primary) hypertension: Secondary | ICD-10-CM | POA: Diagnosis not present

## 2017-09-06 DIAGNOSIS — I059 Rheumatic mitral valve disease, unspecified: Secondary | ICD-10-CM

## 2017-09-06 DIAGNOSIS — I3481 Nonrheumatic mitral (valve) annulus calcification: Secondary | ICD-10-CM

## 2017-09-06 NOTE — Progress Notes (Signed)
CARDIOLOGY OFFICE NOTE  Date:  09/06/2017    Jeremiah Collins Date of Birth: June 02, 1934 Medical Record #[id number]  PCP:  [name], DO  Cardiologist:  [facility]    Chief Complaint  Patient presents with  . Cardiac Valve Problem    Follow up visit - seen for Dr. Marlou Porch    History of Present Illness: Jeremiah Collins is a 83 y.o. male who presents today for a follow up/work in visit. Seen for Dr. Marlou Porch.   He was originally referred here last year for abnormality on echo - his echocardiogram in March 2017 demonstrated calcified mass at the mitral valve. There was question whether this could be related to ocular issues that had been noted by Dr. Tempie Hoist.  Dr. Marlou Porch reviewed echocardiogram. He noted that it looked like he had dense mitral annular calcification, not particularly a mass on the posterior mitral valve leaflet.  Noted that on exam he had not had any stroke, MI, syncope or palpitations. EKG was ok. Remote smoker.  Dr. 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 It was felt that he has both mitral annular calcification as well as aortic valve calcification both of which theoretically could release small particles of calcium causing this retinal malformation. More statin was advised but he declined.  His other issues include metastatic prostate cancer, DM, HTN, HLD, anemia, & CKD.   Comes in today. Here alone. Says he feels like his heart is ok. He complains of being "staggery". This is his primary complaint. No falls. Does have some neuropathy. He is wondering if he is on too much medicine. No actual chest pain. He does get short of breath if he does too much. 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 He tries to stay active. Says his current PSA is zero. BP looks good. He remains obese.    Past Medical History:  Diagnosis Date    . Acute bronchitis 09/12/2012  . 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 Bone metastases ([place])    left mid tibial shaft  . Cataract    Dr.Groat  . Cellulitis and abscess of leg, except foot 01/17/2012  . Chest pain, unspecified 04/28/2004  . Closed fracture of five ribs 03/24/2004  . First degree atrioventricular block 04/25/2007  . Gross hematuria 09/06/2011  . History of radiation therapy 04/03/14- 04/17/14   mid to distal left tibia 3000 cGy in 10 sessions  . HTN (hypertension)   . Hx of radiation therapy 11/1998   prostate fossa - 6040 cGy, 33 fx, Dr Danny Lawless  . Osteoarthrosis involving, or with mention of more than one site, but not specified as generalized, multiple sites 10/22/2010  . Other abnormal blood chemistry 04/29/1991  . Other and unspecified hyperlipidemia 01/02/2013  . Palpitations 04/28/2004  . Prostate cancer ([place]) 12/16/1994   gleason 7  . Reflux esophagitis 05/10/2008  . Routine general medical examination at a health care facility   . Spinal stenosis, unspecified region other than cervical 04/29/1995  . Type II or unspecified type diabetes mellitus without mention of complication, uncontrolled     Past Surgical History:  Procedure Laterality Date  . AIR/FLUID EXCHANGE Left 01/09/2016   Procedure: AIR/FLUID EXCHANGE;  Surgeon: Jalene Mullet, MD;  Location: [facility];  Service: Ophthalmology;  Laterality: Left;  . CYSTOSCOPY W/ URETERAL STENT PLACEMENT  12/31/14  . [place]  05/16/2015  . EYE SURGERY  2006   cataract, Dr Shanon Rosser  . LASER PHOTO ABLATION Left 01/09/2016   Procedure: LASER PHOTO ABLATION;  Surgeon: Jalene Mullet, MD;  Location: [facility];  Service: Ophthalmology;  Laterality: Left;  Endolaser  . Left ureteral stent placement  Week of 12/05/11  . PARS PLANA VITRECTOMY Left 01/09/2016   Procedure: PARS PLANA VITRECTOMY WITH 25 GAUGE LEFT EYE ;  Surgeon: Jalene Mullet, MD;  Location: [place];  Service: Ophthalmology;  Laterality: Left;  .  PERFLUORONE INJECTION Left 01/09/2016   Procedure: PERFLUORONE INJECTION;  Surgeon: Jalene Mullet, MD;  Location: [place];  Service: Ophthalmology;  Laterality: Left;  . PROSTATECTOMY  1996   Dr. Rosana Hoes     Medications: Current Meds  Medication Sig  . aspirin 81 MG tablet Take 1 tablet (81 mg total) by mouth daily.  . Cholecalciferol 1000 UNITS tablet Take 1,000 Units by mouth daily.  Marland Kitchen doxazosin (CARDURA) 4 MG tablet TAKE 1 TABLET BY MOUTH ONCE DAILY  . enzalutamide (XTANDI) 40 MG capsule Take 40 mg by mouth daily. Take 4 per day  . glucose blood (ONETOUCH VERIO) test strip Check blood sugar one to two times a week. Dx:E11.29  . leuprolide, 6 Month, (LEUPROLIDE ACETATE, 6 MONTH,) 45 MG injection Inject 45 mg into the skin every 6 (six) months. Filled by [facility]  . lisinopril-hydrochlorothiazide (PRINZIDE,ZESTORETIC) 20-12.5 MG tablet Take 1 tablet by mouth 2 (two) times daily.  . metFORMIN (GLUCOPHAGE) 500 MG tablet Take one tablet by mouth once daily with breakfast to control blood sugar  . metoprolol tartrate (LOPRESSOR) 50 MG tablet TAKE ONE TABLET BY MOUTH ONCE DAILY  . simvastatin (ZOCOR) 5 MG tablet TAKE 1 TABLET BY MOUTH ONCE DAILY  . traMADol (ULTRAM) 50 MG tablet Take 50 mg by mouth.  . [DISCONTINUED] lisinopril-hydrochlorothiazide (PRINZIDE,ZESTORETIC) 20-12.5 MG tablet Take 2 tabs po in AM and 1 tab po in PM for blood pressure     Allergies: Allergies  Allergen Reactions  . Abiraterone Other (See Comments) and Nausea And Vomiting    Other reaction(s): Increased Heart Rate (intolerance) Other reaction(s): Increased Heart Rate (intolerance) Other reaction(s): Increased Heart Rate (intolerance)    Social History: The patient  reports that he quit smoking about 48 years ago. His smoking use included Cigars. He quit after 5.00 years of use. He has never used smokeless tobacco. He reports that he does not drink alcohol or use drugs.   Family History: The patient's  family history includes Heart disease in his father; Stroke in his sister.   Review of Systems: Please see the history of present illness.   Otherwise, the review of systems is positive for none.   All other systems are reviewed and negative.   Physical Exam: VS:  BP 136/74   Pulse 66   Ht _0  (1.803 m)   Wt 271 lb 1.9 oz (123 kg)   BMI 37.81 kg/m  .  BMI Body mass index is 37.81 kg/m.  Wt Readings from Last 3 Encounters:  09/06/17 271 lb 1.9 oz (123 kg)  09/05/17 271 lb (122.9 kg)  06/10/17 266 lb (120.7 kg)    General: Pleasant. Elderly male. Obese. Alert and in no acute distress.   HEENT: Normal.  Neck: Supple, no JVD, carotid bruits, or masses noted.  Cardiac: Regular rate and rhythm. Soft outflow murmur. No edema.  Respiratory:  Lungs are clear to auscultation bilaterally with normal work of breathing.  GI: Soft and nontender.  MS: No deformity or atrophy. Gait and ROM intact.  Skin: Warm and dry. Color is normal.  Neuro:  Strength and sensation are intact and no gross focal deficits noted.  Psych: Alert, appropriate and with normal affect.   LABORATORY DATA:  EKG:  EKG is ordered today. This demonstrates NSR with 1st degree AV block - nonspecific ST and T wave changes.  Lab Results  Component Value Date   WBC 7.4 09/01/2016   HGB 11.9 (L) 09/01/2016   HCT 36.2 (L) 09/01/2016   PLT 194 09/01/2016   GLUCOSE 130 (H) 09/01/2016   CHOL 167 06/06/2017   TRIG 85 06/06/2017   HDL 42 06/06/2017   LDLCALC 108 (H) 06/06/2017   ALT 9 09/01/2016   AST 11 09/01/2016   NA 141 09/01/2016   K 4.2 09/01/2016   CL 107 09/01/2016   CREATININE 1.16 09/01/2016   BUN 22 09/01/2016   CO2 22 09/01/2016   TSH 1.26 09/01/2016   HGBA1C 5.7 (H) 06/06/2017     BNP (last 3 results) No results for input(s): BNP in the last 8760 hours.  ProBNP (last 3 results) No results for input(s): PROBNP in the last 8760 hours.   Other Studies Reviewed Today:  Carotid Dopplers  02/16/16: Mild atherosclerotic disease in the carotid arteries. Largest plaque burden is at the right carotid bulb and proximal right internal carotid artery. Estimated degree of stenosis in the internal carotid arteries is less than 50% bilaterally.  Patent vertebral arteries.   Echo Study Conclusions 01/2016  - Left ventricle: The cavity size was normal. Wall thickness was   increased in a pattern of severe LVH. Systolic function was   normal. The estimated ejection fraction was in the range of 60%   to 65%. Wall motion was normal; there were no regional wall   motion abnormalities. Doppler parameters are consistent with   abnormal left ventricular relaxation (grade 1 diastolic   dysfunction). - Aortic valve: There was mild stenosis. Valve area (Vmax): 3.09   cm^2.  Echocardiography.  M-mode, complete 2D, spectral Doppler, and color Doppler.  Birthdate:  Patient birthdate: 1934/05/23.  Age:  Patient is 83 yr old.  Sex:  Gender: male.    BMI: 36.1 kg/m^2.  Blood pressure:     156/80  Patient status:  Inpatient.  Study date: Study date: 02/11/2016. Study time: 02:54 PM.  Location:  Echo laboratory. Aorta:  Aortic root: The aortic root was normal in size. Ascending aorta: The ascending aorta was mildly dilated.  ------------------------------------------------------------------- Mitral valve:  There is a calcified mass on the posterior leaflet of the mitral valve.  Moderately thickened leaflets posterior.  ------------------------------------------------------------------- Left atrium:  The atrium was normal in size.  ------------------------------------------------------------------- Right ventricle:  The cavity size was normal. Systolic function was normal.  ------------------------------------------------------------------- Tricuspid valve:   Structurally normal valve.   Leaflet separation was normal.  Doppler:  Transvalvular velocity was within the normal range. There  was trivial regurgitation.  ------------------------------------------------------------------- Pulmonary artery:   Systolic pressure was within the normal range.   ------------------------------------------------------------------- Right atrium:  The atrium was normal in size.  ------------------------------------------------------------------- Pericardium:  There was no pericardial effusion.    Assessment/Plan:  1. Mitral annular calcification/Aortic Stenosis - last echo reviewed by Dr. Marlou Porch - felt to have dense MAC - common in aging persons with HTN. EKG ok. No real symptoms other than DOE - which seems more multifactorial to me. He has not wanted to increase his statin therapy. Will get his echo  updated. See back in a year.   2. Carotid disease - needs his doppler study updated.   3. HTN - BP is fine here today. No changes made.   4. Prostate cancer - [facility]  5. Obesity  6. HLD - on statin therapy.   Current medicines are reviewed with the patient today.  The patient does not have concerns regarding medicines other than what has been noted above.  The following changes have been made:  See above.  Labs/ tests ordered today include:    Orders Placed This Encounter  Procedures  . EKG 12-Lead  . ECHOCARDIOGRAM COMPLETE     Disposition:   FU with Dr. Marlou Porch in one year.   Patient is agreeable to this plan and will call if any problems develop in the interim.   SignedTruitt Merle, NP  09/06/2017 9:32 AM  [facility] [address] [place], [place]  [address] Phone: [phone] Fax: [phone]

## 2017-09-06 NOTE — Patient Instructions (Addendum)
We will be checking the following labs today - NONE   Medication Instructions:    Continue with your current medicines.     Testing/Procedures To Be Arranged:  Echocardiogram  Carotid doppler study  Follow-Up:   See Dr. Marlou Porch in one year    Other Special Instructions:   Stay active!    If you need a refill on your cardiac medications before your next appointment, please call your pharmacy.   Call the [place] office at [phone] if you have any questions, problems or concerns.

## 2017-09-12 DIAGNOSIS — E531 Pyridoxine deficiency: Secondary | ICD-10-CM | POA: Diagnosis not present

## 2017-09-12 DIAGNOSIS — R5383 Other fatigue: Secondary | ICD-10-CM | POA: Diagnosis not present

## 2017-09-12 DIAGNOSIS — C7951 Secondary malignant neoplasm of bone: Secondary | ICD-10-CM | POA: Diagnosis not present

## 2017-09-12 DIAGNOSIS — C61 Malignant neoplasm of prostate: Secondary | ICD-10-CM | POA: Diagnosis not present

## 2017-09-12 DIAGNOSIS — G629 Polyneuropathy, unspecified: Secondary | ICD-10-CM | POA: Diagnosis not present

## 2017-09-12 DIAGNOSIS — I10 Essential (primary) hypertension: Secondary | ICD-10-CM | POA: Diagnosis not present

## 2017-09-12 DIAGNOSIS — Z923 Personal history of irradiation: Secondary | ICD-10-CM | POA: Diagnosis not present

## 2017-09-12 DIAGNOSIS — M7730 Calcaneal spur, unspecified foot: Secondary | ICD-10-CM | POA: Diagnosis not present

## 2017-09-12 DIAGNOSIS — M8580 Other specified disorders of bone density and structure, unspecified site: Secondary | ICD-10-CM | POA: Diagnosis not present

## 2017-09-14 ENCOUNTER — Other Ambulatory Visit: Payer: Self-pay

## 2017-09-14 ENCOUNTER — Ambulatory Visit (HOSPITAL_COMMUNITY): Payer: Medicare Other | Attending: Cardiovascular Disease

## 2017-09-14 DIAGNOSIS — I10 Essential (primary) hypertension: Secondary | ICD-10-CM | POA: Diagnosis not present

## 2017-09-14 DIAGNOSIS — I3481 Nonrheumatic mitral (valve) annulus calcification: Secondary | ICD-10-CM

## 2017-09-14 DIAGNOSIS — I059 Rheumatic mitral valve disease, unspecified: Secondary | ICD-10-CM | POA: Diagnosis not present

## 2017-09-21 ENCOUNTER — Inpatient Hospital Stay (HOSPITAL_COMMUNITY): Admission: RE | Admit: 2017-09-21 | Payer: 59 | Source: Ambulatory Visit

## 2017-09-23 ENCOUNTER — Ambulatory Visit (HOSPITAL_COMMUNITY)
Admission: RE | Admit: 2017-09-23 | Discharge: 2017-09-23 | Disposition: A | Discharge status: Home / Self Care | Payer: Medicare Other | Source: Ambulatory Visit | Attending: Cardiovascular Disease | Admitting: Cardiovascular Disease

## 2017-09-23 DIAGNOSIS — E119 Type 2 diabetes mellitus without complications: Secondary | ICD-10-CM | POA: Insufficient documentation

## 2017-09-23 DIAGNOSIS — Z87891 Personal history of nicotine dependence: Secondary | ICD-10-CM | POA: Diagnosis not present

## 2017-09-23 DIAGNOSIS — E785 Hyperlipidemia, unspecified: Secondary | ICD-10-CM | POA: Insufficient documentation

## 2017-09-23 DIAGNOSIS — I10 Essential (primary) hypertension: Secondary | ICD-10-CM | POA: Insufficient documentation

## 2017-09-23 DIAGNOSIS — I6523 Occlusion and stenosis of bilateral carotid arteries: Secondary | ICD-10-CM | POA: Insufficient documentation

## 2017-09-27 DIAGNOSIS — Z6837 Body mass index (BMI) 37.0-37.9, adult: Secondary | ICD-10-CM | POA: Diagnosis not present

## 2017-09-27 DIAGNOSIS — N1330 Unspecified hydronephrosis: Secondary | ICD-10-CM | POA: Diagnosis not present

## 2017-09-27 DIAGNOSIS — E669 Obesity, unspecified: Secondary | ICD-10-CM | POA: Diagnosis not present

## 2017-09-27 DIAGNOSIS — E785 Hyperlipidemia, unspecified: Secondary | ICD-10-CM | POA: Diagnosis not present

## 2017-09-27 DIAGNOSIS — Z7984 Long term (current) use of oral hypoglycemic drugs: Secondary | ICD-10-CM | POA: Diagnosis not present

## 2017-09-27 DIAGNOSIS — N135 Crossing vessel and stricture of ureter without hydronephrosis: Secondary | ICD-10-CM | POA: Diagnosis not present

## 2017-09-27 DIAGNOSIS — N183 Chronic kidney disease, stage 3 (moderate): Secondary | ICD-10-CM | POA: Diagnosis not present

## 2017-09-27 DIAGNOSIS — Z888 Allergy status to other drugs, medicaments and biological substances status: Secondary | ICD-10-CM | POA: Diagnosis not present

## 2017-09-27 DIAGNOSIS — R011 Cardiac murmur, unspecified: Secondary | ICD-10-CM | POA: Diagnosis not present

## 2017-09-27 DIAGNOSIS — G4730 Sleep apnea, unspecified: Secondary | ICD-10-CM | POA: Diagnosis not present

## 2017-09-27 DIAGNOSIS — Z7982 Long term (current) use of aspirin: Secondary | ICD-10-CM | POA: Diagnosis not present

## 2017-09-27 DIAGNOSIS — N131 Hydronephrosis with ureteral stricture, not elsewhere classified: Secondary | ICD-10-CM | POA: Diagnosis not present

## 2017-09-27 DIAGNOSIS — Z8546 Personal history of malignant neoplasm of prostate: Secondary | ICD-10-CM | POA: Diagnosis not present

## 2017-09-27 DIAGNOSIS — N320 Bladder-neck obstruction: Secondary | ICD-10-CM | POA: Diagnosis not present

## 2017-09-27 DIAGNOSIS — Z923 Personal history of irradiation: Secondary | ICD-10-CM | POA: Diagnosis not present

## 2017-09-27 DIAGNOSIS — E1122 Type 2 diabetes mellitus with diabetic chronic kidney disease: Secondary | ICD-10-CM | POA: Diagnosis not present

## 2017-09-27 DIAGNOSIS — E1140 Type 2 diabetes mellitus with diabetic neuropathy, unspecified: Secondary | ICD-10-CM | POA: Diagnosis not present

## 2017-09-27 DIAGNOSIS — I129 Hypertensive chronic kidney disease with stage 1 through stage 4 chronic kidney disease, or unspecified chronic kidney disease: Secondary | ICD-10-CM | POA: Diagnosis not present

## 2017-09-27 DIAGNOSIS — C7951 Secondary malignant neoplasm of bone: Secondary | ICD-10-CM | POA: Diagnosis not present

## 2017-09-27 DIAGNOSIS — Z79899 Other long term (current) drug therapy: Secondary | ICD-10-CM | POA: Diagnosis not present

## 2017-09-27 DIAGNOSIS — Z87891 Personal history of nicotine dependence: Secondary | ICD-10-CM | POA: Diagnosis not present

## 2017-09-27 DIAGNOSIS — K219 Gastro-esophageal reflux disease without esophagitis: Secondary | ICD-10-CM | POA: Diagnosis not present

## 2017-09-27 DIAGNOSIS — Z466 Encounter for fitting and adjustment of urinary device: Secondary | ICD-10-CM | POA: Diagnosis not present

## 2017-09-27 DIAGNOSIS — Z79891 Long term (current) use of opiate analgesic: Secondary | ICD-10-CM | POA: Diagnosis not present

## 2017-10-18 ENCOUNTER — Other Ambulatory Visit: Payer: Self-pay | Admitting: Internal Medicine

## 2017-10-18 DIAGNOSIS — I517 Cardiomegaly: Secondary | ICD-10-CM

## 2017-10-18 DIAGNOSIS — I10 Essential (primary) hypertension: Secondary | ICD-10-CM

## 2017-11-09 ENCOUNTER — Other Ambulatory Visit: Payer: Medicare Other

## 2017-11-09 DIAGNOSIS — E782 Mixed hyperlipidemia: Secondary | ICD-10-CM | POA: Diagnosis not present

## 2017-11-09 DIAGNOSIS — E0821 Diabetes mellitus due to underlying condition with diabetic nephropathy: Secondary | ICD-10-CM | POA: Diagnosis not present

## 2017-11-10 LAB — LIPID PANEL
CHOLESTEROL: 184 mg/dL (ref <200)
HDL: 48 mg/dL (ref >40)
LDL CHOLESTEROL (CALC): 110 mg/dL — AB
Non-HDL Cholesterol (Calc): 136 mg/dL (calc) — ABNORMAL HIGH (ref <130)
TRIGLYCERIDES: 143 mg/dL (ref <150)
Total CHOL/HDL Ratio: 3.8 (calc) (ref <5.0)

## 2017-11-10 LAB — HEMOGLOBIN A1C
EAG (MMOL/L): 6.6 (calc)
Hgb A1c MFr Bld: 5.8 % of total Hgb — ABNORMAL HIGH (ref <5.7)
Mean Plasma Glucose: 120 (calc)

## 2017-11-11 ENCOUNTER — Ambulatory Visit (INDEPENDENT_AMBULATORY_CARE_PROVIDER_SITE_OTHER): Payer: Medicare Other | Admitting: Internal Medicine

## 2017-11-11 ENCOUNTER — Encounter: Payer: Self-pay | Admitting: Internal Medicine

## 2017-11-11 VITALS — BP 160/80 | HR 68 | Temp 97.3°F | Wt 272.0 lb

## 2017-11-11 DIAGNOSIS — I6523 Occlusion and stenosis of bilateral carotid arteries: Secondary | ICD-10-CM

## 2017-11-11 DIAGNOSIS — E782 Mixed hyperlipidemia: Secondary | ICD-10-CM

## 2017-11-11 DIAGNOSIS — I10 Essential (primary) hypertension: Secondary | ICD-10-CM | POA: Diagnosis not present

## 2017-11-11 DIAGNOSIS — Z6837 Body mass index (BMI) 37.0-37.9, adult: Secondary | ICD-10-CM

## 2017-11-11 DIAGNOSIS — E0821 Diabetes mellitus due to underlying condition with diabetic nephropathy: Secondary | ICD-10-CM | POA: Diagnosis not present

## 2017-11-11 DIAGNOSIS — Z6838 Body mass index (BMI) 38.0-38.9, adult: Secondary | ICD-10-CM | POA: Insufficient documentation

## 2017-11-11 DIAGNOSIS — C61 Malignant neoplasm of prostate: Secondary | ICD-10-CM

## 2017-11-11 DIAGNOSIS — M150 Primary generalized (osteo)arthritis: Secondary | ICD-10-CM | POA: Diagnosis not present

## 2017-11-11 DIAGNOSIS — R600 Localized edema: Secondary | ICD-10-CM

## 2017-11-11 DIAGNOSIS — G609 Hereditary and idiopathic neuropathy, unspecified: Secondary | ICD-10-CM

## 2017-11-11 DIAGNOSIS — M159 Polyosteoarthritis, unspecified: Secondary | ICD-10-CM

## 2017-11-11 NOTE — Progress Notes (Signed)
Patient ID: Jeremiah Collins, male   DOB: 03/10/1934, 83 y.o.   MRN: [id number]   Location:  [facility]  Provider: DR MONICA S CARTER  Code Status: FULL CODE Goals of Care:  Advanced Directives 09/05/2017  Does Patient Have a Medical Advance Directive? No  Would patient like information on creating a medical advance directive? No - Patient declined     Chief Complaint  Patient presents with  . Medical Management of Chronic Issues    5mth follow-up, discuss lab results    HPI: Patient is a 83 y.o. male seen today for medical management of chronic diseases.  He has no concerns today. Discussed lab results  Obesity - weight is up 6 lbs since July 2018. He does not follow a specific diet and has no routine exercise. He reports porr exercise tolerance due to multiple joint pain. BMI 37.81  OD Hollenhorst plaque - unchanged. he had sx x 2 as the 1st procedure was ineffective. Vision improved after 2nd sx. He is followed by Dr Pantel (ophthamology).   mitral annular calcification - Oct 2018 2D echo revealed mild AS and moderate focal basal hypertrophy of septum with mild concentric hypertrophy and nml EF (60-65%); grade 1 DD. He has SOB. No CP or dizziness. No new leg swelling. He saw cardio and determined to have mitral annular calcification but no sx recommended at this time. Cardio recommended increase in statin tx but pt declines. LDL 110. Carotid duplex revealed <50% stenosis b/l ICA in 2017  hx prostate CA and rec'd XRT for bone mets (left tibia and pelvis) - He is currently on leuprolide injection q6mos. He also takes cardura. PSA stable on Xtandi. He gets surveillance CT chest/abd/pelvis and whole body bone scan at [facility]. No changes compared to previous studies. Followed by urology Dr Davis and oncology Dr Thomas. LDH 121; PSA <0.01. He had ureteral stent change in Oct 2018 with Dr Davis and stent changed q3mos. He takes prn tramadol when stent changed. He has difficulty  starting urine stream and some times needs to self cath. Albumin 3.3; Hgb 13  DM - BS 120s usually. He checks once weekly. No low BS reactions. No tingling. He takes metformin. A1c 5.8%. Cr 1.18. Last eye exam in 07/2016. No diabetic changes -Followed by Dr Gould. He has noticed numbness/tingling in feet since beginning Xtandi.  HTN - stable on lisinopril-HCT and metoprolol. Takes ASA daily  Hyperlipidemia - borderline controlled on low dose simvastatin. Occasional muscle weakness. LDL 110 (prev 108)  Multiple joint pain - mostly in back. He takes prn tylenol  Hx anemia - stable. Hgb 13; Ferritin 30  CKD - stage 2. Cr 1.18. Stable    Past Medical History:  Diagnosis Date  . Acute bronchitis 09/12/2012  . 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 Bone metastases (HCC)    left mid tibial shaft  . Cataract    Dr.Groat  . Cellulitis and abscess of leg, except foot 01/17/2012  . Chest pain, unspecified 04/28/2004  . Closed fracture of five ribs 03/24/2004  . First degree atrioventricular block 04/25/2007  . Gross hematuria 09/06/2011  . History of radiation therapy 04/03/14- 04/17/14   mid to distal left tibia 3000 cGy in 10 sessions  . HTN (hypertension)   . Hx of radiation therapy 11/1998   prostate fossa - 6040 cGy, 33 fx, Dr Goodchild  . Osteoarthrosis involving, or with mention of more than one site, but not specified as generalized, multiple sites 10/22/2010  .   Other abnormal blood chemistry 04/29/1991  . Other and unspecified hyperlipidemia 01/02/2013  . Palpitations 04/28/2004  . Prostate cancer (HCC) 12/16/1994   gleason 7  . Reflux esophagitis 05/10/2008  . Routine general medical examination at a health care facility   . Spinal stenosis, unspecified region other than cervical 04/29/1995  . Type II or unspecified type diabetes mellitus without mention of complication, uncontrolled     Past Surgical History:  Procedure Laterality Date  . AIR/FLUID EXCHANGE Left 01/09/2016    Procedure: AIR/FLUID EXCHANGE;  Surgeon: Narendra Patel, MD;  Location: [facility] OR;  Service: Ophthalmology;  Laterality: Left;  . CYSTOSCOPY W/ URETERAL STENT PLACEMENT  12/31/14  . CYSTOSCOPY W/ URETERAL STENT PLACEMENT  05/16/2015  . EYE SURGERY  2006   cataract, Dr Goat  . LASER PHOTO ABLATION Left 01/09/2016   Procedure: LASER PHOTO ABLATION;  Surgeon: Narendra Patel, MD;  Location: [facility] OR;  Service: Ophthalmology;  Laterality: Left;  Endolaser  . Left ureteral stent placement  Week of 12/05/11  . PARS PLANA VITRECTOMY Left 01/09/2016   Procedure: PARS PLANA VITRECTOMY WITH 25 GAUGE LEFT EYE ;  Surgeon: Narendra Patel, MD;  Location: [facility] OR;  Service: Ophthalmology;  Laterality: Left;  . PERFLUORONE INJECTION Left 01/09/2016   Procedure: PERFLUORONE INJECTION;  Surgeon: Narendra Patel, MD;  Location: [facility] OR;  Service: Ophthalmology;  Laterality: Left;  . PROSTATECTOMY  1996   Dr. Davis     reports that he quit smoking about 48 years ago. His smoking use included cigars. He quit after 5.00 years of use. he has never used smokeless tobacco. He reports that he does not drink alcohol or use drugs. Social History   Socioeconomic History  . Marital status: Married    Spouse name: Not on file  . Number of children: Not on file  . Years of education: Not on file  . Highest education level: Not on file  Social Needs  . Financial resource strain: Not on file  . Food insecurity - worry: Not on file  . Food insecurity - inability: Not on file  . Transportation needs - medical: Not on file  . Transportation needs - non-medical: Not on file  Occupational History  . Not on file  Tobacco Use  . Smoking status: Former Smoker    Years: 5.00    Types: Cigars    Last attempt to quit: 12/16/1968    Years since quitting: 48.9  . Smokeless tobacco: Never Used  Substance and Sexual Activity  . Alcohol use: No    Alcohol/week: 0.0 oz  . Drug use: No  . Sexual activity: No  Other Topics Concern  . Not on  file  Social History Narrative  . Not on file    Family History  Problem Relation Age of Onset  . Heart disease Father   . Stroke Sister     Allergies  Allergen Reactions  . Abiraterone Other (See Comments) and Nausea And Vomiting    Other reaction(s): Increased Heart Rate (intolerance) Other reaction(s): Increased Heart Rate (intolerance) Other reaction(s): Increased Heart Rate (intolerance)    Outpatient Encounter Medications as of 11/11/2017  Medication Sig  . acetaminophen (TYLENOL) 650 MG CR tablet Take 650 mg by mouth as needed for pain.  . aspirin 81 MG tablet Take 1 tablet (81 mg total) by mouth daily.  . Cholecalciferol 1000 UNITS tablet Take 1,000 Units by mouth daily.  . doxazosin (CARDURA) 4 MG tablet TAKE 1 TABLET BY MOUTH ONCE DAILY  .   enzalutamide (XTANDI) 40 MG capsule Take 120 mg by mouth daily.   . glucose blood (ONETOUCH VERIO) test strip Check blood sugar one to two times a week. Dx:E11.29  . leuprolide, 6 Month, (LEUPROLIDE ACETATE, 6 MONTH,) 45 MG injection Inject 45 mg into the skin every 6 (six) months. Filled by [facility]  . lisinopril-hydrochlorothiazide (PRINZIDE,ZESTORETIC) 20-12.5 MG tablet Take 1 tablet by mouth 2 (two) times daily.  . metFORMIN (GLUCOPHAGE) 500 MG tablet Take one tablet by mouth once daily with breakfast to control blood sugar  . metoprolol tartrate (LOPRESSOR) 50 MG tablet TAKE 1 TABLET BY MOUTH ONCE DAILY  . Omega-3 Fatty Acids (FISH OIL) 1000 MG CAPS Take 1 capsule by mouth daily.  . pyridoxine (B-6) 100 MG tablet Take 100 mg by mouth daily.  . simvastatin (ZOCOR) 5 MG tablet TAKE 1 TABLET BY MOUTH ONCE DAILY  . traMADol (ULTRAM) 50 MG tablet Take 50 mg by mouth as needed.    No facility-administered encounter medications on file as of 11/11/2017.     Review of Systems:  Review of Systems  Respiratory: Positive for shortness of breath.   Cardiovascular: Positive for leg swelling.  Genitourinary: Positive for difficulty  urinating.  Musculoskeletal: Positive for arthralgias.  All other systems reviewed and are negative.   Health Maintenance  Topic Date Due  . HEMOGLOBIN A1C  05/10/2018  . FOOT EXAM  06/10/2018  . OPHTHALMOLOGY EXAM  07/16/2018  . TETANUS/TDAP  11/08/2023  . INFLUENZA VACCINE  Completed  . PNA vac Low Risk Adult  Completed    Physical Exam: Vitals:   11/11/17 0926  BP: (!) 160/80  Pulse: 68  Temp: (!) 97.3 F (36.3 C)  TempSrc: Oral  SpO2: 97%  Weight: 272 lb (123.4 kg)   Body mass index is 37.94 kg/m. Physical Exam  Constitutional: He is oriented to person, place, and time. He appears well-developed and well-nourished.  HENT:  Mouth/Throat: Oropharynx is clear and moist.  MMM; no oral thrush  Eyes: Pupils are equal, round, and reactive to light. No scleral icterus.  Neck: Neck supple. Carotid bruit is not present. No thyromegaly present.  Cardiovascular: Normal rate, regular rhythm and intact distal pulses. Exam reveals no gallop and no friction rub.  Murmur (1/6 SEM) heard. +1 pitting LE edema b/l. No calf TTP  Pulmonary/Chest: Effort normal and breath sounds normal. He has no wheezes. He has no rales. He exhibits no tenderness.  Abdominal: Soft. Normal appearance and bowel sounds are normal. He exhibits no distension, no abdominal bruit, no pulsatile midline mass and no mass. There is no hepatomegaly. There is no tenderness. There is no rigidity, no rebound and no guarding. No hernia.  obese  Musculoskeletal: He exhibits edema (small and large joints).  Lymphadenopathy:    He has no cervical adenopathy.  Neurological: He is alert and oriented to person, place, and time. He has normal reflexes.  Skin: Skin is warm and dry. No rash noted.  Psychiatric: He has a normal mood and affect. His behavior is normal. Judgment and thought content normal.    Labs reviewed: Basic Metabolic Panel: No results for input(s): NA, K, CL, CO2, GLUCOSE, BUN, CREATININE, CALCIUM, MG,  PHOS, TSH in the last 8760 hours. Liver Function Tests: No results for input(s): AST, ALT, ALKPHOS, BILITOT, PROT, ALBUMIN in the last 8760 hours. No results for input(s): LIPASE, AMYLASE in the last 8760 hours. No results for input(s): AMMONIA in the last 8760 hours. CBC: No results for input(s): WBC,   NEUTROABS, HGB, HCT, MCV, PLT in the last 8760 hours. Lipid Panel: Recent Labs    01/03/17 0001 06/06/17 0816 11/09/17 0826  CHOL 149 167 184  HDL 42 42 48  LDLCALC 90 108*  --   TRIG 83 85 143  CHOLHDL 3.5 4.0 3.8   Lab Results  Component Value Date   HGBA1C 5.8 (H) 11/09/2017    Procedures since last visit: No results found.  Assessment/Plan   ICD-10-CM   1. Diabetes mellitus due to underlying condition, controlled, with diabetic nephropathy, without long-term current use of insulin (HCC) E08.21 CMP with eGFR    CBC with Differential/Platelets    Hemoglobin A1c    Microalbumin/Creatinine Ratio, Urine  2. Mixed hyperlipidemia E78.2 TSH    Lipid Panel  3. Idiopathic peripheral neuropathy G60.9   4. Bilateral edema of lower extremity R60.0   5. Essential hypertension, benign I10 CMP with eGFR    CBC with Differential/Platelets  6. Primary osteoarthritis involving multiple joints M15.0   7. Malignant neoplasm of prostate (HCC) C61   8. Class 2 severe obesity due to excess calories with serious comorbidity and body mass index (BMI) of 37.0 to 37.9 in adult (HCC) E66.01    Z68.37        May take tramadol as needed for pain in joints  Continue current medications as ordered. No change in statin dose 2/2 myalgias he already experiences  Increase exercise as tolerated; watch fatty foods  Follow up with specialists as scheduled  Follow up in 5 mos for EV. Fasting labs prior to appt   Monica S. Carter, D. O., F. A. C. O. I.  [facility] [address] , [place] [address] [phone] Cell (Monday-Friday 8 AM - 5  PM) [phone] After 5 PM and follow prompts  

## 2017-11-11 NOTE — Patient Instructions (Addendum)
May take tramadol as needed for pain in joints  Continue current medications as ordered  Follow up with specialists as scheduled  Follow up in 5 mos for EV. Fasting labs prior to appt

## 2017-11-21 DIAGNOSIS — H26491 Other secondary cataract, right eye: Secondary | ICD-10-CM | POA: Diagnosis not present

## 2017-11-21 DIAGNOSIS — H35372 Puckering of macula, left eye: Secondary | ICD-10-CM | POA: Diagnosis not present

## 2017-11-21 DIAGNOSIS — Z961 Presence of intraocular lens: Secondary | ICD-10-CM | POA: Diagnosis not present

## 2017-11-21 DIAGNOSIS — H31092 Other chorioretinal scars, left eye: Secondary | ICD-10-CM | POA: Diagnosis not present

## 2017-11-21 DIAGNOSIS — Z8546 Personal history of malignant neoplasm of prostate: Secondary | ICD-10-CM | POA: Diagnosis not present

## 2017-11-28 ENCOUNTER — Other Ambulatory Visit: Payer: Self-pay | Admitting: Internal Medicine

## 2017-11-28 DIAGNOSIS — I10 Essential (primary) hypertension: Secondary | ICD-10-CM

## 2017-12-13 DIAGNOSIS — C7951 Secondary malignant neoplasm of bone: Secondary | ICD-10-CM | POA: Diagnosis not present

## 2017-12-13 DIAGNOSIS — C61 Malignant neoplasm of prostate: Secondary | ICD-10-CM | POA: Diagnosis not present

## 2017-12-27 DIAGNOSIS — Z79891 Long term (current) use of opiate analgesic: Secondary | ICD-10-CM | POA: Diagnosis not present

## 2017-12-27 DIAGNOSIS — Z466 Encounter for fitting and adjustment of urinary device: Secondary | ICD-10-CM | POA: Diagnosis not present

## 2017-12-27 DIAGNOSIS — C61 Malignant neoplasm of prostate: Secondary | ICD-10-CM | POA: Diagnosis not present

## 2017-12-27 DIAGNOSIS — N393 Stress incontinence (female) (male): Secondary | ICD-10-CM | POA: Diagnosis not present

## 2017-12-27 DIAGNOSIS — Z8546 Personal history of malignant neoplasm of prostate: Secondary | ICD-10-CM | POA: Diagnosis not present

## 2017-12-27 DIAGNOSIS — E1140 Type 2 diabetes mellitus with diabetic neuropathy, unspecified: Secondary | ICD-10-CM | POA: Diagnosis not present

## 2017-12-27 DIAGNOSIS — N320 Bladder-neck obstruction: Secondary | ICD-10-CM | POA: Diagnosis not present

## 2017-12-27 DIAGNOSIS — K219 Gastro-esophageal reflux disease without esophagitis: Secondary | ICD-10-CM | POA: Diagnosis not present

## 2017-12-27 DIAGNOSIS — C7951 Secondary malignant neoplasm of bone: Secondary | ICD-10-CM | POA: Diagnosis not present

## 2017-12-27 DIAGNOSIS — E669 Obesity, unspecified: Secondary | ICD-10-CM | POA: Diagnosis not present

## 2017-12-27 DIAGNOSIS — Z923 Personal history of irradiation: Secondary | ICD-10-CM | POA: Diagnosis not present

## 2017-12-27 DIAGNOSIS — Z4682 Encounter for fitting and adjustment of non-vascular catheter: Secondary | ICD-10-CM | POA: Diagnosis not present

## 2017-12-27 DIAGNOSIS — N131 Hydronephrosis with ureteral stricture, not elsewhere classified: Secondary | ICD-10-CM | POA: Diagnosis not present

## 2017-12-27 DIAGNOSIS — Z87891 Personal history of nicotine dependence: Secondary | ICD-10-CM | POA: Diagnosis not present

## 2017-12-27 DIAGNOSIS — Z6838 Body mass index (BMI) 38.0-38.9, adult: Secondary | ICD-10-CM | POA: Diagnosis not present

## 2017-12-27 DIAGNOSIS — N183 Chronic kidney disease, stage 3 (moderate): Secondary | ICD-10-CM | POA: Diagnosis not present

## 2017-12-27 DIAGNOSIS — Z7982 Long term (current) use of aspirin: Secondary | ICD-10-CM | POA: Diagnosis not present

## 2017-12-27 DIAGNOSIS — Z7984 Long term (current) use of oral hypoglycemic drugs: Secondary | ICD-10-CM | POA: Diagnosis not present

## 2017-12-27 DIAGNOSIS — I129 Hypertensive chronic kidney disease with stage 1 through stage 4 chronic kidney disease, or unspecified chronic kidney disease: Secondary | ICD-10-CM | POA: Diagnosis not present

## 2017-12-27 DIAGNOSIS — Z79899 Other long term (current) drug therapy: Secondary | ICD-10-CM | POA: Diagnosis not present

## 2017-12-27 DIAGNOSIS — N135 Crossing vessel and stricture of ureter without hydronephrosis: Secondary | ICD-10-CM | POA: Diagnosis not present

## 2017-12-27 DIAGNOSIS — E785 Hyperlipidemia, unspecified: Secondary | ICD-10-CM | POA: Diagnosis not present

## 2017-12-27 DIAGNOSIS — E1122 Type 2 diabetes mellitus with diabetic chronic kidney disease: Secondary | ICD-10-CM | POA: Diagnosis not present

## 2017-12-27 DIAGNOSIS — R011 Cardiac murmur, unspecified: Secondary | ICD-10-CM | POA: Diagnosis not present

## 2018-01-19 ENCOUNTER — Other Ambulatory Visit: Payer: Self-pay | Admitting: Internal Medicine

## 2018-01-26 ENCOUNTER — Telehealth: Payer: Self-pay | Admitting: *Deleted

## 2018-01-26 MED ORDER — DOXAZOSIN MESYLATE 4 MG PO TABS: 4.0000 mg | ORAL_TABLET | Freq: Every day | ORAL | 0 refills | Status: DC

## 2018-01-26 NOTE — Telephone Encounter (Signed)
Patient walked into office requesting a Rx for Doxazosin. Stated that the pharmacy will not fill due to not time. Due 02/26/18. Patient stated that he didn't think he received all of his medication. Had a 90 day supply. Is willing to pay out of pocket for a 30 day supply until he can get it refilled through insurance. Rx faxed to pharmacy with pharmacy note.

## 2018-01-28 DIAGNOSIS — I10 Essential (primary) hypertension: Secondary | ICD-10-CM | POA: Diagnosis not present

## 2018-01-28 DIAGNOSIS — M47812 Spondylosis without myelopathy or radiculopathy, cervical region: Secondary | ICD-10-CM | POA: Diagnosis not present

## 2018-01-28 DIAGNOSIS — W19XXXA Unspecified fall, initial encounter: Secondary | ICD-10-CM | POA: Diagnosis not present

## 2018-01-28 DIAGNOSIS — Z7984 Long term (current) use of oral hypoglycemic drugs: Secondary | ICD-10-CM | POA: Diagnosis not present

## 2018-01-28 DIAGNOSIS — E119 Type 2 diabetes mellitus without complications: Secondary | ICD-10-CM | POA: Diagnosis not present

## 2018-01-28 DIAGNOSIS — R51 Headache: Secondary | ICD-10-CM | POA: Diagnosis not present

## 2018-01-28 DIAGNOSIS — S01311A Laceration without foreign body of right ear, initial encounter: Secondary | ICD-10-CM | POA: Diagnosis not present

## 2018-01-28 DIAGNOSIS — Z7982 Long term (current) use of aspirin: Secondary | ICD-10-CM | POA: Diagnosis not present

## 2018-01-28 DIAGNOSIS — Z79899 Other long term (current) drug therapy: Secondary | ICD-10-CM | POA: Diagnosis not present

## 2018-01-28 DIAGNOSIS — S0990XA Unspecified injury of head, initial encounter: Secondary | ICD-10-CM | POA: Diagnosis not present

## 2018-02-06 DIAGNOSIS — S01311D Laceration without foreign body of right ear, subsequent encounter: Secondary | ICD-10-CM | POA: Diagnosis not present

## 2018-02-22 ENCOUNTER — Other Ambulatory Visit: Payer: Self-pay | Admitting: Internal Medicine

## 2018-03-04 ENCOUNTER — Other Ambulatory Visit: Payer: Self-pay | Admitting: Internal Medicine

## 2018-03-04 DIAGNOSIS — I10 Essential (primary) hypertension: Secondary | ICD-10-CM

## 2018-03-16 DIAGNOSIS — I10 Essential (primary) hypertension: Secondary | ICD-10-CM | POA: Diagnosis not present

## 2018-03-16 DIAGNOSIS — C7951 Secondary malignant neoplasm of bone: Secondary | ICD-10-CM | POA: Diagnosis not present

## 2018-03-16 DIAGNOSIS — G629 Polyneuropathy, unspecified: Secondary | ICD-10-CM | POA: Diagnosis not present

## 2018-03-16 DIAGNOSIS — C61 Malignant neoplasm of prostate: Secondary | ICD-10-CM | POA: Diagnosis not present

## 2018-03-28 DIAGNOSIS — Z923 Personal history of irradiation: Secondary | ICD-10-CM | POA: Diagnosis not present

## 2018-03-28 DIAGNOSIS — N320 Bladder-neck obstruction: Secondary | ICD-10-CM | POA: Diagnosis not present

## 2018-03-28 DIAGNOSIS — N131 Hydronephrosis with ureteral stricture, not elsewhere classified: Secondary | ICD-10-CM | POA: Diagnosis not present

## 2018-03-28 DIAGNOSIS — N189 Chronic kidney disease, unspecified: Secondary | ICD-10-CM | POA: Diagnosis not present

## 2018-03-28 DIAGNOSIS — N1339 Other hydronephrosis: Secondary | ICD-10-CM | POA: Diagnosis not present

## 2018-03-28 DIAGNOSIS — Z7984 Long term (current) use of oral hypoglycemic drugs: Secondary | ICD-10-CM | POA: Diagnosis not present

## 2018-03-28 DIAGNOSIS — Z8546 Personal history of malignant neoplasm of prostate: Secondary | ICD-10-CM | POA: Diagnosis not present

## 2018-03-28 DIAGNOSIS — Z466 Encounter for fitting and adjustment of urinary device: Secondary | ICD-10-CM | POA: Diagnosis not present

## 2018-03-28 DIAGNOSIS — E1122 Type 2 diabetes mellitus with diabetic chronic kidney disease: Secondary | ICD-10-CM | POA: Diagnosis not present

## 2018-03-28 DIAGNOSIS — Z9079 Acquired absence of other genital organ(s): Secondary | ICD-10-CM | POA: Diagnosis not present

## 2018-03-28 DIAGNOSIS — I129 Hypertensive chronic kidney disease with stage 1 through stage 4 chronic kidney disease, or unspecified chronic kidney disease: Secondary | ICD-10-CM | POA: Diagnosis not present

## 2018-03-29 ENCOUNTER — Other Ambulatory Visit: Payer: Self-pay | Admitting: Internal Medicine

## 2018-03-29 DIAGNOSIS — I517 Cardiomegaly: Secondary | ICD-10-CM

## 2018-03-29 DIAGNOSIS — I10 Essential (primary) hypertension: Secondary | ICD-10-CM

## 2018-04-12 ENCOUNTER — Other Ambulatory Visit: Payer: Medicare Other

## 2018-04-12 DIAGNOSIS — E782 Mixed hyperlipidemia: Secondary | ICD-10-CM

## 2018-04-12 DIAGNOSIS — I10 Essential (primary) hypertension: Secondary | ICD-10-CM | POA: Diagnosis not present

## 2018-04-12 DIAGNOSIS — E0821 Diabetes mellitus due to underlying condition with diabetic nephropathy: Secondary | ICD-10-CM

## 2018-04-13 LAB — HEMOGLOBIN A1C
EAG (MMOL/L): 6.6 (calc)
Hgb A1c MFr Bld: 5.8 % of total Hgb — ABNORMAL HIGH (ref <5.7)
Mean Plasma Glucose: 120 (calc)

## 2018-04-13 LAB — CBC WITH DIFFERENTIAL/PLATELET
BASOS ABS: 28 {cells}/uL (ref 0–200)
Basophils Relative: 0.4 %
EOS ABS: 110 {cells}/uL (ref 15–500)
Eosinophils Relative: 1.6 %
HCT: 34.1 % — ABNORMAL LOW (ref 38.5–50.0)
Hemoglobin: 11.9 g/dL — ABNORMAL LOW (ref 13.2–17.1)
Lymphs Abs: 2091 cells/uL (ref 850–3900)
MCH: 29 pg (ref 27.0–33.0)
MCHC: 34.9 g/dL (ref 32.0–36.0)
MCV: 83 fL (ref 80.0–100.0)
MPV: 9.9 fL (ref 7.5–12.5)
Monocytes Relative: 6.2 %
NEUTROS PCT: 61.5 %
Neutro Abs: 4244 cells/uL (ref 1500–7800)
PLATELETS: 192 10*3/uL (ref 140–400)
RBC: 4.11 10*6/uL — AB (ref 4.20–5.80)
RDW: 14.4 % (ref 11.0–15.0)
TOTAL LYMPHOCYTE: 30.3 %
WBC mixed population: 428 cells/uL (ref 200–950)
WBC: 6.9 10*3/uL (ref 3.8–10.8)

## 2018-04-13 LAB — COMPLETE METABOLIC PANEL WITH GFR
AG RATIO: 1.6 (calc) (ref 1.0–2.5)
ALKALINE PHOSPHATASE (APISO): 94 U/L (ref 40–115)
ALT: 9 U/L (ref 9–46)
AST: 10 U/L (ref 10–35)
Albumin: 3.5 g/dL — ABNORMAL LOW (ref 3.6–5.1)
BILIRUBIN TOTAL: 0.4 mg/dL (ref 0.2–1.2)
BUN/Creatinine Ratio: 21 (calc) (ref 6–22)
BUN: 27 mg/dL — ABNORMAL HIGH (ref 7–25)
CHLORIDE: 109 mmol/L (ref 98–110)
CO2: 25 mmol/L (ref 20–32)
Calcium: 8.6 mg/dL (ref 8.6–10.3)
Creat: 1.31 mg/dL — ABNORMAL HIGH (ref 0.70–1.11)
GFR, Est African American: 59 mL/min/{1.73_m2} — ABNORMAL LOW (ref >=60)
GFR, Est Non African American: 51 mL/min/{1.73_m2} — ABNORMAL LOW (ref >=60)
Globulin: 2.2 g/dL (calc) (ref 1.9–3.7)
Glucose, Bld: 121 mg/dL — ABNORMAL HIGH (ref 65–99)
POTASSIUM: 4.3 mmol/L (ref 3.5–5.3)
SODIUM: 141 mmol/L (ref 135–146)
Total Protein: 5.7 g/dL — ABNORMAL LOW (ref 6.1–8.1)

## 2018-04-13 LAB — LIPID PANEL
CHOL/HDL RATIO: 4.3 (calc) (ref <5.0)
CHOLESTEROL: 179 mg/dL (ref <200)
HDL: 42 mg/dL (ref >40)
LDL CHOLESTEROL (CALC): 117 mg/dL — AB
Non-HDL Cholesterol (Calc): 137 mg/dL (calc) — ABNORMAL HIGH (ref <130)
Triglycerides: 94 mg/dL (ref <150)

## 2018-04-13 LAB — MICROALBUMIN / CREATININE URINE RATIO
CREATININE, URINE: 91 mg/dL (ref 20–320)
MICROALB UR: 7.3 mg/dL
MICROALB/CREAT RATIO: 80 ug/mg{creat} — AB (ref <30)

## 2018-04-13 LAB — TSH: TSH: 1.8 mIU/L (ref 0.40–4.50)

## 2018-04-14 ENCOUNTER — Encounter: Payer: Self-pay | Admitting: Internal Medicine

## 2018-04-14 ENCOUNTER — Ambulatory Visit (INDEPENDENT_AMBULATORY_CARE_PROVIDER_SITE_OTHER): Payer: Medicare Other | Admitting: Internal Medicine

## 2018-04-14 VITALS — BP 118/60 | HR 60 | Temp 97.9°F | Resp 10 | Ht 71.0 in | Wt 266.0 lb

## 2018-04-14 DIAGNOSIS — I10 Essential (primary) hypertension: Secondary | ICD-10-CM

## 2018-04-14 DIAGNOSIS — Z6837 Body mass index (BMI) 37.0-37.9, adult: Secondary | ICD-10-CM | POA: Diagnosis not present

## 2018-04-14 DIAGNOSIS — C61 Malignant neoplasm of prostate: Secondary | ICD-10-CM

## 2018-04-14 DIAGNOSIS — E0821 Diabetes mellitus due to underlying condition with diabetic nephropathy: Secondary | ICD-10-CM

## 2018-04-14 DIAGNOSIS — G609 Hereditary and idiopathic neuropathy, unspecified: Secondary | ICD-10-CM | POA: Diagnosis not present

## 2018-04-14 DIAGNOSIS — R600 Localized edema: Secondary | ICD-10-CM | POA: Diagnosis not present

## 2018-04-14 DIAGNOSIS — Z7189 Other specified counseling: Secondary | ICD-10-CM

## 2018-04-14 DIAGNOSIS — M150 Primary generalized (osteo)arthritis: Secondary | ICD-10-CM

## 2018-04-14 DIAGNOSIS — I700 Atherosclerosis of aorta: Secondary | ICD-10-CM | POA: Diagnosis not present

## 2018-04-14 DIAGNOSIS — M159 Polyosteoarthritis, unspecified: Secondary | ICD-10-CM

## 2018-04-14 DIAGNOSIS — E782 Mixed hyperlipidemia: Secondary | ICD-10-CM | POA: Diagnosis not present

## 2018-04-14 NOTE — Patient Instructions (Signed)
Continue current medications as ordered  Follow up with specialists as scheduled  Follow up in 4 mos for DM, hyperlipidemia, OA, prostate CA. Fasting labs prior to appt  Keeping you healthy  Get these tests  Blood pressure- Have your blood pressure checked once a year by your healthcare provider.  Normal blood pressure is 120/80  Weight- Have your body mass index (BMI) calculated to screen for obesity.  BMI is a measure of body fat based on height and weight. You can also calculate your own BMI at ViewBanking.si.  Cholesterol- Have your cholesterol checked every year.  Diabetes- Have your blood sugar checked regularly if you have high blood pressure, high cholesterol, have a family history of diabetes or if you are overweight.  Screening for Colon Cancer- Colonoscopy starting at age 90.  Screening may begin sooner depending on your family history and other health conditions. Follow up colonoscopy as directed by your Gastroenterologist.  Screening for Prostate Cancer- Both blood work (PSA) and a rectal exam help screen for Prostate Cancer.  Screening begins at age 65 with African-American men and at age 67 with Caucasian men.  Screening may begin sooner depending on your family history.  Take these medicines  Aspirin- One aspirin daily can help prevent Heart disease and Stroke.  Flu shot- Every fall.  Tetanus- Every 10 years.  Zostavax- Once after the age of 5 to prevent Shingles.  Pneumonia shot- Once after the age of 41; if you are younger than 56, ask your healthcare provider if you need a Pneumonia shot.  Take these steps  Don't smoke- If you do smoke, talk to your doctor about quitting.  For tips on how to quit, go to www.smokefree.gov or call 1-800-QUIT-NOW.  Be physically active- Exercise 5 days a week for at least 30 minutes.  If you are not already physically active start slow and gradually work up to 30 minutes of moderate physical activity.  Examples of  moderate activity include walking briskly, mowing the yard, dancing, swimming, bicycling, etc.  Eat a healthy diet- Eat a variety of healthy food such as fruits, vegetables, low fat milk, low fat cheese, yogurt, lean meant, poultry, fish, beans, tofu, etc. For more information go to www.thenutritionsource.org  Drink alcohol in moderation- Limit alcohol intake to less than two drinks a day. Never drink and drive.  Dentist- Brush and floss twice daily; visit your dentist twice a year.  Depression- Your emotional health is as important as your physical health. If you're feeling down, or losing interest in things you would normally enjoy please talk to your healthcare provider.  Eye exam- Visit your eye doctor every year.  Safe sex- If you may be exposed to a sexually transmitted infection, use a condom.  Seat belts- Seat belts can save your life; always wear one.  Smoke/Carbon Monoxide detectors- These detectors need to be installed on the appropriate level of your home.  Replace batteries at least once a year.  Skin cancer- When out in the sun, cover up and use sunscreen 15 SPF or higher.  Violence- If anyone is threatening you, please tell your healthcare provider.  Living Will/ Health care power of attorney- Speak with your healthcare provider and family.

## 2018-04-14 NOTE — Progress Notes (Signed)
Patient ID: Jeremiah Collins, male   DOB: 1934-09-26, 84 y.o.   MRN: [id number]   Location:  [facility]  Place of Service:  OFFICE  Provider: [name], DO  Patient Care Team: [name], DO as PCP - General (Internal Medicine) Myrlene Broker, MD as Attending Physician (Urology) [name], MD as Consulting Physician (Ophthalmology) [name], MD as Consulting Physician (Ophthalmology)  Extended Emergency Contact Information Primary Emergency Contact: Welchel,Brenda Address: [address]          [place], [place] [address] [place] Phone: [phone] Relation: Spouse  Code Status: FULL CODE Goals of Care: Advanced Directive information Advanced Directives 04/14/2018  Does Patient Have a Medical Advance Directive? No  Would patient like information on creating a medical advance directive? No - Patient declined     Chief Complaint  Patient presents with  . Medical Management of Chronic Issues    EXtended Visit, EKG completed 08/2018, AWV completed 09/05/17, MMSE was 27/30   . Medication Refill    No refills needed   . Advance care planning    Refused ACP   . Immunizations    Refused Shingrix     HPI: Patient is a 84 y.o. male seen in today for an extended visit. He continues to feel "wobbly" frequently. No changes in medications recently. He completed AWV 09/05/17. MMSE 27/30. Lab results from 04/12/18 discussed.  Obesity - weight down 6 lbs since Dec 2018. He does not follow a specific diet and has no routine exercise. He reports pooir exercise tolerance due to multiple joint pain. BMI 37.12  OD Hollenhorst plaque - unchanged. he had sx x 2 as the 1st procedure was ineffective. Vision improved after 2nd sx. He is followed by Dr Judd Lien (ophthamology).   mitral annular calcification - no changes; Oct 2018 2D echo revealed mild AS and moderate focal basal hypertrophy of septum with mild concentric hypertrophy and nml EF (60-65%); grade 1  DD. He has SOB. No CP or dizziness. No new leg swelling. He saw cardio and determined to have mitral annular calcification but no sx recommended at this time. Cardio recommended increase in statin tx but pt declines. LDL 110. Carotid duplex revealed <50% stenosis b/l ICA in 2017  hx prostate CA and rec'd XRT for bone mets (left tibia and pelvis) - He is currently on leuprolide injection q64mo. He also takes cardura. PSA stable on Xtandi. He gets surveillance CT chest[facility]abd[facility]pelvis [facility] changes compared to previous studies. Followed by urology Dr DRosana Hoesand oncology Dr TMarcello Moores [facility] 121; PSA <0.01. He had ureteral stent change in May 2019 with Dr DRosana Hoesand stent changed q373mo He takes prn tramadol when stent changed. He has difficulty starting urine stream and some times needs to self cath. Albumin 3.5; Hgb 11.9  DM - BS 120s usually. He checks once weekly. No low BS reactions. He takes metformin. A1c 5.8%. Cr 1.31. Eye exam is UTD. No diabetic changes -Followed by Dr GoDelman CheadleHe has noticed numbness/tingling in feet since beginning XtCortlandLDL 117 on low dose statin; urine microalbumin/Cr ratio 80  HTN - controlled on lisinopril-HCT and metoprolol. Takes ASA daily  Hyperlipidemia - borderline controlled on low dose simvastatin. Occasional muscle weakness. LDL 117 (prev 110)  Multiple joint pain - mostly in back. He takes prn tylenol  Hx anemia - stable. Hgb 11.9; Ferritin 30  CKD - stage 2. Cr 1.31. Stable   Aortic  atherosclerosis - moderate per CT abd/pelvis in 2015. Also noted right common iliac artery aneurysm 1.9 cm.  Depression screen [facility] 2/9 04/14/2018 11/11/2017 09/05/2017 09/03/2016 04/23/2016  Decreased Interest 0 0 0 0 0  Down, Depressed, Hopeless 0 0 0 0 0  PHQ - 2 Score 0 0 0 0 0    Fall Risk  04/14/2018 11/11/2017 09/05/2017 06/10/2017 01/07/2017  Falls in the past year? No No No No No   MMSE - Mini Mental State Exam 09/05/2017 09/03/2016  09/10/2014  Orientation to time 5 5 5   Orientation to Place 5 5 5   Registration 3 3 3   Attention/ Calculation 4 5 5   Recall 1 3 3   Language- name 2 objects 2 2 2   Language- repeat 1 1 1   Language- follow 3 step command 3 3 3   Language- read & follow direction 1 1 1   Write a sentence 1 0 1  Copy design 1 1 1   Total score 27 29 30      Health Maintenance  Topic Date Due  . FOOT EXAM  06/10/2018  . INFLUENZA VACCINE  06/15/2018  . OPHTHALMOLOGY EXAM  07/16/2018  . HEMOGLOBIN A1C  10/13/2018  . TETANUS/TDAP  11/08/2023  . PNA vac Low Risk Adult  Completed    Urinary incontinence? Yes - urinary leakage; wears incontinence pad; occasionally self cath; followed by urology  Exercise? As tolerated  Diet? Healthy most days  No exam data present  Hearing: no issues    Dentition: sees dentist every 6 mos for dental hygiene  Pain: usually in back; controlled with Tylenol  Past Medical History:  Diagnosis Date  . Acute bronchitis 09/12/2012  . 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 Bone metastases ([place])    left mid tibial shaft  . Cataract    Dr.Groat  . Cellulitis and abscess of leg, except foot 01/17/2012  . Chest pain, unspecified 04/28/2004  . Closed fracture of five ribs 03/24/2004  . First degree atrioventricular block 04/25/2007  . Gross hematuria 09/06/2011  . History of radiation therapy 04/03/14- 04/17/14   mid to distal left tibia 3000 cGy in 10 sessions  . HTN (hypertension)   . Hx of radiation therapy 11/1998   prostate fossa - 6040 cGy, 33 fx, Dr Danny Lawless  . Osteoarthrosis involving, or with mention of more than one site, but not specified as generalized, multiple sites 10/22/2010  . Other abnormal blood chemistry 04/29/1991  . Other and unspecified hyperlipidemia 01/02/2013  . Palpitations 04/28/2004  . Prostate cancer ([place]) 12/16/1994   gleason 7  . Reflux esophagitis 05/10/2008  . Routine general medical examination at a health care facility   . Spinal stenosis,  unspecified region other than cervical 04/29/1995  . Type II or unspecified type diabetes mellitus without mention of complication, uncontrolled     Past Surgical History:  Procedure Laterality Date  . AIR/FLUID EXCHANGE Left 01/09/2016   Procedure: AIR/FLUID EXCHANGE;  Surgeon: Jalene Mullet, MD;  Location: [facility];  Service: Ophthalmology;  Laterality: Left;  . CYSTOSCOPY W/ URETERAL STENT PLACEMENT  12/31/14  . CYSTOSCOPY W/ URETERAL STENT PLACEMENT  05/16/2015  . EYE SURGERY  2006   cataract, Dr Shanon Rosser  . LASER PHOTO ABLATION Left 01/09/2016   Procedure: LASER PHOTO ABLATION;  Surgeon: Jalene Mullet, MD;  Location: [facility];  Service: Ophthalmology;  Laterality: Left;  Endolaser  . Left ureteral stent placement  Week of 12/05/11  . PARS PLANA VITRECTOMY Left 01/09/2016   Procedure: PARS PLANA VITRECTOMY WITH 25 GAUGE LEFT  EYE ;  Surgeon: Jalene Mullet, MD;  Location: [facility];  Service: Ophthalmology;  Laterality: Left;  . PERFLUORONE INJECTION Left 01/09/2016   Procedure: PERFLUORONE INJECTION;  Surgeon: Jalene Mullet, MD;  Location: [facility];  Service: Ophthalmology;  Laterality: Left;  . PROSTATECTOMY  1996   Dr. Rosana Hoes    Family History  Problem Relation Age of Onset  . Heart disease Father   . Stroke Sister    Family Status  Relation Name Status  . Father  Deceased  . Mother  Deceased  . Sister Murray Hodgkins Deceased  . Sister Alma Deceased  . Sister 06/03/2023 Deceased     Social History   Socioeconomic History  . Marital status: Married    Spouse name: Not on file  . Number of children: Not on file  . Years of education: Not on file  . Highest education level: Not on file  Occupational History  . Not on file  Social Needs  . Financial resource strain: Not on file  . Food insecurity:    Worry: Not on file    Inability: Not on file  . Transportation needs:    Medical: Not on file    Non-medical: Not on file  Tobacco Use  . Smoking status: Former Smoker    Years: 5.00    Types:  Cigars    Last attempt to quit: 12/16/1968    Years since quitting: 49.3  . Smokeless tobacco: Never Used  Substance and Sexual Activity  . Alcohol use: No    Alcohol/week: 0.0 oz  . Drug use: No  . Sexual activity: Never  Lifestyle  . Physical activity:    Days per week: Not on file    Minutes per session: Not on file  . Stress: Not on file  Relationships  . Social connections:    Talks on phone: Not on file    Gets together: Not on file    Attends religious service: Not on file    Active member of club or organization: Not on file    Attends meetings of clubs or organizations: Not on file    Relationship status: Not on file  . Intimate partner violence:    Fear of current or ex partner: Not on file    Emotionally abused: Not on file    Physically abused: Not on file    Forced sexual activity: Not on file  Other Topics Concern  . Not on file  Social History Narrative  . Not on file    Allergies  Allergen Reactions  . Abiraterone Other (See Comments) and Nausea And Vomiting    Other reaction(s): Increased Heart Rate (intolerance) Other reaction(s): Increased Heart Rate (intolerance) Other reaction(s): Increased Heart Rate (intolerance)    Allergies as of 04/14/2018      Reactions   Abiraterone Other (See Comments), Nausea And Vomiting   Other reaction(s): Increased Heart Rate (intolerance) Other reaction(s): Increased Heart Rate (intolerance) Other reaction(s): Increased Heart Rate (intolerance)      Medication List        Accurate as of 04/14/18 10:07 AM. Always use your most recent med list.          acetaminophen 650 MG CR tablet Commonly known as:  TYLENOL Take 650 mg by mouth as needed for pain.   aspirin 81 MG tablet Take 1 tablet (81 mg total) by mouth daily.   Cholecalciferol 1000 units tablet Take 1,000 Units by mouth daily.   doxazosin 4 MG tablet Commonly known  as:  CARDURA Take 1 tablet (4 mg total) by mouth daily.   Fish Oil 1000 MG  Caps Take 1 capsule by mouth daily.   glucose blood test strip Commonly known as:  ONETOUCH VERIO Check blood sugar one to two times a week. Dx:E11.29   leuprolide acetate (6 Month) 45 MG injection Generic drug:  leuprolide (6 Month) Inject 45 mg into the skin every 6 (six) months. Filled by [facility]   lisinopril-hydrochlorothiazide 20-12.5 MG tablet Commonly known as:  PRINZIDE,ZESTORETIC TAKE 2 TABLETS BY MOUTH ONCE DAILY IN THE MORNING AND 1 TABLET IN THE EVENING FOR BLOOD PRESSURE   metFORMIN 500 MG tablet Commonly known as:  GLUCOPHAGE Take one tablet by mouth once daily with breakfast to control blood sugar   metoprolol tartrate 50 MG tablet Commonly known as:  LOPRESSOR TAKE 1 TABLET BY MOUTH ONCE DAILY   pyridoxine 100 MG tablet Commonly known as:  B-6 Take 100 mg by mouth daily.   simvastatin 5 MG tablet Commonly known as:  ZOCOR TAKE 1 TABLET BY MOUTH ONCE DAILY   ULTRAM 50 MG tablet Generic drug:  traMADol Take 50 mg by mouth as needed.   XTANDI 40 MG capsule Generic drug:  enzalutamide Take 120 mg by mouth daily.        Review of Systems:  Review of Systems  Cardiovascular: Positive for leg swelling.  Genitourinary: Positive for difficulty urinating.  Musculoskeletal: Positive for back pain.  All other systems reviewed and are negative.   Physical Exam: Vitals:   04/14/18 0950  BP: (!) 104/58  Repeat by myself 118/60  Pulse: 60  Resp: 10  Temp: 97.9 F (36.6 C)  TempSrc: Oral  SpO2: 96%  Weight: 266 lb (120.7 kg)  Height: 5' 11"  (1.803 m)   Body mass index is 37.1 kg/m. Physical Exam  Constitutional: He is oriented to person, place, and time. He appears well-developed and well-nourished. No distress.  HENT:  Head: Normocephalic and atraumatic.  Right Ear: Hearing, tympanic membrane, external ear and ear canal normal.  Left Ear: Hearing, tympanic membrane, external ear and ear canal normal.  Mouth/Throat: Uvula is midline,  oropharynx is clear and moist and mucous membranes are normal. He does not have dentures.  Eyes: Pupils are equal, round, and reactive to light. Conjunctivae, EOM and lids are normal. No scleral icterus.  Neck: Trachea normal and normal range of motion. Neck supple. Carotid bruit is not present. No thyroid mass and no thyromegaly present.  Cardiovascular: Normal rate, regular rhythm and intact distal pulses. Exam reveals no gallop and no friction rub.  Murmur (2/6 SEM) heard. +1 pitting LE edema b/l; no calf TTP; chronic venous stasis changes  Pulmonary/Chest: Effort normal and breath sounds normal. He has no wheezes. He has no rhonchi. He has no rales. He exhibits no mass. Right breast exhibits no inverted nipple, no mass, no nipple discharge, no skin change and no tenderness. Left breast exhibits no inverted nipple, no mass, no nipple discharge, no skin change and no tenderness. Breasts are symmetrical.    Abdominal: Soft. Normal appearance, normal aorta and bowel sounds are normal. He exhibits distension. He exhibits no pulsatile midline mass and no mass. There is no hepatosplenomegaly. There is no tenderness. There is no rigidity, no rebound and no guarding. No hernia.  obese  Genitourinary:  Genitourinary Comments: Deferred to urology; rectal not performed due to prostate cancer with mets  Musculoskeletal: Normal range of motion. He exhibits no deformity.  Lymphadenopathy:  Head (right side): No posterior auricular adenopathy present.       Head (left side): No posterior auricular adenopathy present.    He has no cervical adenopathy.       Right: No supraclavicular adenopathy present.       Left: No supraclavicular adenopathy present.  Neurological: He is alert and oriented to person, place, and time. He has normal strength and normal reflexes. No cranial nerve deficit. Gait normal.  Skin: Skin is warm, dry and intact. No rash noted. Nails show no clubbing.  Psychiatric: He has a  normal mood and affect. His speech is normal and behavior is normal. Thought content normal. Cognition and memory are normal.   Diabetic Foot Exam - Simple   Simple Foot Form Diabetic Foot exam was performed with the following findings:  Yes 04/14/2018 11:08 AM  Visual Inspection See comments:  Yes Sensation Testing Intact to touch and monofilament testing bilaterally:  Yes Pulse Check Posterior Tibialis and Dorsalis pulse intact bilaterally:  Yes Comments B/l hammer toes, toenail elongation with dystrophic changes      Labs reviewed:  Basic Metabolic Panel: Recent Labs    04/12/18 0827  NA 141  K 4.3  CL 109  CO2 25  GLUCOSE 121*  BUN 27*  CREATININE 1.31*  CALCIUM 8.6  TSH 1.80   Liver Function Tests: Recent Labs    04/12/18 0827  AST 10  ALT 9  BILITOT 0.4  PROT 5.7*   No results for input(s): LIPASE, AMYLASE in the last 8760 hours. No results for input(s): AMMONIA in the last 8760 hours. CBC: Recent Labs    04/12/18 0827  WBC 6.9  NEUTROABS 4,244  HGB 11.9*  HCT 34.1*  MCV 83.0  PLT 192   Lipid Panel: Recent Labs    06/06/17 0816 11/09/17 0826 04/12/18 0827  CHOL 167 184 179  HDL 42 48 42  LDLCALC 108* 110* 117*  TRIG 85 143 94  CHOLHDL 4.0 3.8 4.3   Lab Results  Component Value Date   HGBA1C 5.8 (H) 04/12/2018    Procedures: No results found.  Assessment/Plan   ICD-10-CM   1. Diabetes mellitus due to underlying condition, controlled, with diabetic nephropathy, without long-term current use of insulin (HCC) E08.21 CMP with eGFR([facility])    Hemoglobin A1c  2. Mixed hyperlipidemia E78.2 Lipid Panel  3. Idiopathic peripheral neuropathy G60.9   4. Class 2 severe obesity due to excess calories with serious comorbidity and body mass index (BMI) of 37.0 to 37.9 in adult ([place]) E66.01    Z68.37   5. Primary osteoarthritis involving multiple joints M15.0   6. Essential hypertension, benign I10   7. Bilateral edema of lower extremity R60.0    8. Malignant neoplasm of prostate ([place]) C61   9. Atherosclerosis of aorta (HCC) I70.0    T/c repeat CT abd/pelvis next ov to follow abdominal atherosclerosis and right CIA aneurysm.  Refused colonoscopy  He declines advanced directives information but desires FULL CODE  Increase exercise as tolerated - he does have poor exercise tolerance  Continue current medications as ordered  Follow up with specialists as scheduled  Follow up in 4 mos for DM, hyperlipidemia, OA, prostate CA. Fasting labs prior to appt  [place] S. Perlie Gold  [facility] [address] [place], [place] [address] [phone] Cell (Monday-Friday 8 AM - 5 PM) [phone] After 5 PM and follow prompts

## 2018-05-22 DIAGNOSIS — H35372 Puckering of macula, left eye: Secondary | ICD-10-CM | POA: Diagnosis not present

## 2018-05-22 DIAGNOSIS — H30003 Unspecified focal chorioretinal inflammation, bilateral: Secondary | ICD-10-CM | POA: Diagnosis not present

## 2018-05-22 DIAGNOSIS — H26491 Other secondary cataract, right eye: Secondary | ICD-10-CM | POA: Diagnosis not present

## 2018-05-22 DIAGNOSIS — H31092 Other chorioretinal scars, left eye: Secondary | ICD-10-CM | POA: Diagnosis not present

## 2018-05-22 DIAGNOSIS — Z8546 Personal history of malignant neoplasm of prostate: Secondary | ICD-10-CM | POA: Diagnosis not present

## 2018-05-22 DIAGNOSIS — H3509 Other intraretinal microvascular abnormalities: Secondary | ICD-10-CM | POA: Diagnosis not present

## 2018-05-30 ENCOUNTER — Other Ambulatory Visit: Payer: Self-pay | Admitting: Internal Medicine

## 2018-06-13 DIAGNOSIS — G629 Polyneuropathy, unspecified: Secondary | ICD-10-CM | POA: Diagnosis not present

## 2018-06-13 DIAGNOSIS — C7951 Secondary malignant neoplasm of bone: Secondary | ICD-10-CM | POA: Diagnosis not present

## 2018-06-13 DIAGNOSIS — C61 Malignant neoplasm of prostate: Secondary | ICD-10-CM | POA: Diagnosis not present

## 2018-06-13 DIAGNOSIS — E119 Type 2 diabetes mellitus without complications: Secondary | ICD-10-CM | POA: Diagnosis not present

## 2018-06-13 DIAGNOSIS — Z192 Hormone resistant malignancy status: Secondary | ICD-10-CM | POA: Diagnosis not present

## 2018-06-13 DIAGNOSIS — R2689 Other abnormalities of gait and mobility: Secondary | ICD-10-CM | POA: Diagnosis not present

## 2018-06-13 DIAGNOSIS — I10 Essential (primary) hypertension: Secondary | ICD-10-CM | POA: Diagnosis not present

## 2018-06-13 DIAGNOSIS — R2681 Unsteadiness on feet: Secondary | ICD-10-CM | POA: Diagnosis not present

## 2018-06-13 DIAGNOSIS — Z7984 Long term (current) use of oral hypoglycemic drugs: Secondary | ICD-10-CM | POA: Diagnosis not present

## 2018-06-27 DIAGNOSIS — E1122 Type 2 diabetes mellitus with diabetic chronic kidney disease: Secondary | ICD-10-CM | POA: Diagnosis not present

## 2018-06-27 DIAGNOSIS — E785 Hyperlipidemia, unspecified: Secondary | ICD-10-CM | POA: Diagnosis not present

## 2018-06-27 DIAGNOSIS — C61 Malignant neoplasm of prostate: Secondary | ICD-10-CM | POA: Diagnosis not present

## 2018-06-27 DIAGNOSIS — N320 Bladder-neck obstruction: Secondary | ICD-10-CM | POA: Diagnosis not present

## 2018-06-27 DIAGNOSIS — I129 Hypertensive chronic kidney disease with stage 1 through stage 4 chronic kidney disease, or unspecified chronic kidney disease: Secondary | ICD-10-CM | POA: Diagnosis not present

## 2018-06-27 DIAGNOSIS — Z7984 Long term (current) use of oral hypoglycemic drugs: Secondary | ICD-10-CM | POA: Diagnosis not present

## 2018-06-27 DIAGNOSIS — N35014 Post-traumatic urethral stricture, male, unspecified: Secondary | ICD-10-CM | POA: Diagnosis not present

## 2018-06-27 DIAGNOSIS — N183 Chronic kidney disease, stage 3 (moderate): Secondary | ICD-10-CM | POA: Diagnosis not present

## 2018-06-27 DIAGNOSIS — D649 Anemia, unspecified: Secondary | ICD-10-CM | POA: Diagnosis not present

## 2018-06-27 DIAGNOSIS — Z87891 Personal history of nicotine dependence: Secondary | ICD-10-CM | POA: Diagnosis not present

## 2018-06-27 DIAGNOSIS — Z960 Presence of urogenital implants: Secondary | ICD-10-CM | POA: Diagnosis not present

## 2018-06-27 DIAGNOSIS — C7951 Secondary malignant neoplasm of bone: Secondary | ICD-10-CM | POA: Diagnosis not present

## 2018-06-27 DIAGNOSIS — N35919 Unspecified urethral stricture, male, unspecified site: Secondary | ICD-10-CM | POA: Diagnosis not present

## 2018-06-27 DIAGNOSIS — N135 Crossing vessel and stricture of ureter without hydronephrosis: Secondary | ICD-10-CM | POA: Diagnosis not present

## 2018-06-27 DIAGNOSIS — N1330 Unspecified hydronephrosis: Secondary | ICD-10-CM | POA: Diagnosis not present

## 2018-06-27 DIAGNOSIS — Z466 Encounter for fitting and adjustment of urinary device: Secondary | ICD-10-CM | POA: Diagnosis not present

## 2018-07-05 ENCOUNTER — Encounter: Payer: Self-pay | Admitting: Internal Medicine

## 2018-07-18 ENCOUNTER — Other Ambulatory Visit: Payer: Medicare Other

## 2018-07-18 ENCOUNTER — Other Ambulatory Visit: Payer: Self-pay

## 2018-07-18 DIAGNOSIS — M84469D Pathological fracture, unspecified tibia and fibula, subsequent encounter for fracture with routine healing: Secondary | ICD-10-CM | POA: Diagnosis not present

## 2018-07-18 DIAGNOSIS — M8580 Other specified disorders of bone density and structure, unspecified site: Secondary | ICD-10-CM | POA: Diagnosis not present

## 2018-07-18 DIAGNOSIS — E0821 Diabetes mellitus due to underlying condition with diabetic nephropathy: Secondary | ICD-10-CM

## 2018-07-18 DIAGNOSIS — Z8781 Personal history of (healed) traumatic fracture: Secondary | ICD-10-CM | POA: Diagnosis not present

## 2018-07-18 DIAGNOSIS — E782 Mixed hyperlipidemia: Secondary | ICD-10-CM

## 2018-07-18 DIAGNOSIS — M84469S Pathological fracture, unspecified tibia and fibula, sequela: Secondary | ICD-10-CM | POA: Diagnosis not present

## 2018-07-18 DIAGNOSIS — C61 Malignant neoplasm of prostate: Secondary | ICD-10-CM | POA: Diagnosis not present

## 2018-07-18 DIAGNOSIS — C7951 Secondary malignant neoplasm of bone: Secondary | ICD-10-CM | POA: Diagnosis not present

## 2018-07-19 ENCOUNTER — Other Ambulatory Visit: Payer: Medicare Other

## 2018-07-19 DIAGNOSIS — E782 Mixed hyperlipidemia: Secondary | ICD-10-CM | POA: Diagnosis not present

## 2018-07-19 DIAGNOSIS — E0821 Diabetes mellitus due to underlying condition with diabetic nephropathy: Secondary | ICD-10-CM | POA: Diagnosis not present

## 2018-07-20 LAB — HEMOGLOBIN A1C
Hgb A1c MFr Bld: 5.8 % of total Hgb — ABNORMAL HIGH (ref <5.7)
Mean Plasma Glucose: 120 (calc)
eAG (mmol/L): 6.6 (calc)

## 2018-07-20 LAB — LIPID PANEL
CHOL/HDL RATIO: 4 (calc) (ref <5.0)
CHOLESTEROL: 162 mg/dL (ref <200)
HDL: 41 mg/dL (ref >40)
LDL Cholesterol (Calc): 104 mg/dL (calc) — ABNORMAL HIGH
Non-HDL Cholesterol (Calc): 121 mg/dL (calc) (ref <130)
Triglycerides: 78 mg/dL (ref <150)

## 2018-07-20 LAB — COMPLETE METABOLIC PANEL WITH GFR
AG RATIO: 1.4 (calc) (ref 1.0–2.5)
ALBUMIN MSPROF: 3.6 g/dL (ref 3.6–5.1)
ALT: 9 U/L (ref 9–46)
AST: 13 U/L (ref 10–35)
Alkaline phosphatase (APISO): 93 U/L (ref 40–115)
BUN/Creatinine Ratio: 21 (calc) (ref 6–22)
BUN: 25 mg/dL (ref 7–25)
CALCIUM: 8.9 mg/dL (ref 8.6–10.3)
CO2: 26 mmol/L (ref 20–32)
Chloride: 107 mmol/L (ref 98–110)
Creat: 1.2 mg/dL — ABNORMAL HIGH (ref 0.70–1.11)
GFR, EST AFRICAN AMERICAN: 65 mL/min/{1.73_m2} (ref >=60)
GFR, EST NON AFRICAN AMERICAN: 56 mL/min/{1.73_m2} — AB (ref >=60)
GLOBULIN: 2.5 g/dL (ref 1.9–3.7)
Glucose, Bld: 119 mg/dL — ABNORMAL HIGH (ref 65–99)
POTASSIUM: 4.2 mmol/L (ref 3.5–5.3)
Sodium: 140 mmol/L (ref 135–146)
TOTAL PROTEIN: 6.1 g/dL (ref 6.1–8.1)
Total Bilirubin: 0.4 mg/dL (ref 0.2–1.2)

## 2018-07-25 ENCOUNTER — Encounter: Payer: Self-pay | Admitting: Internal Medicine

## 2018-07-25 ENCOUNTER — Ambulatory Visit (INDEPENDENT_AMBULATORY_CARE_PROVIDER_SITE_OTHER): Payer: Medicare Other | Admitting: Internal Medicine

## 2018-07-25 VITALS — BP 122/74 | HR 61 | Temp 98.0°F | Ht 71.0 in | Wt 269.6 lb

## 2018-07-25 DIAGNOSIS — R600 Localized edema: Secondary | ICD-10-CM

## 2018-07-25 DIAGNOSIS — I10 Essential (primary) hypertension: Secondary | ICD-10-CM | POA: Diagnosis not present

## 2018-07-25 DIAGNOSIS — M150 Primary generalized (osteo)arthritis: Secondary | ICD-10-CM

## 2018-07-25 DIAGNOSIS — M159 Polyosteoarthritis, unspecified: Secondary | ICD-10-CM

## 2018-07-25 DIAGNOSIS — E782 Mixed hyperlipidemia: Secondary | ICD-10-CM

## 2018-07-25 DIAGNOSIS — C61 Malignant neoplasm of prostate: Secondary | ICD-10-CM | POA: Diagnosis not present

## 2018-07-25 DIAGNOSIS — E0821 Diabetes mellitus due to underlying condition with diabetic nephropathy: Secondary | ICD-10-CM | POA: Diagnosis not present

## 2018-07-25 NOTE — Patient Instructions (Addendum)
Continue current medications as ordered  Follow up with cardiology in October for annual visit  Follow up with other specialists as scheduled  RECOMMEND YOU GET YOUR FLU SHOT IN October - [place] VISIT OR GO TO [facility]  Follow up in 4 mos with Janett Billow for DM, HTN, hyperlipidemia, joint pain, prostate cancer. Fasting labs prior to appt

## 2018-07-25 NOTE — Progress Notes (Signed)
Patient ID: Jeremiah Collins, male   DOB: 09-03-1934, 84 y.o.   MRN: [id number]   Location:  [facility] OFFICE  Provider: DR [name]  Code Status:  Goals of Care:  Advanced Directives 07/25/2018  Does Patient Have a Medical Advance Directive? No  Would patient like information on creating a medical advance directive? -     Chief Complaint  Patient presents with  . Medical Management of Chronic Issues    Pt is being seen for a 4 month routine visit.   . Immunizations    wants flu vaccine today  . Audit C Screening    score of 0    HPI: Patient is a 84 y.o. male seen today for medical management of chronic diseases.    Obesity - weight up 3 lbs since May 2019. He does not follow a specific diet and has no routine exercise. He reports poor exercise tolerance due to multiple joint pain. BMI 37.62  OD [place] plaque - unchanged. he had sx x 2 as the 1st procedure was ineffective. Vision improved after 2nd sx. He is followed by Dr Judd Lien (ophthamology).   mitral annular calcification - no changes; Oct 2018 2D echo revealed mild AS and moderate focal basal hypertrophy of septum with mild concentric hypertrophy and nml EF (60-65%); grade 1 DD. He has SOB. No CP or dizziness. No new leg swelling. Followed by cardio Dr Leonia Reeves NP. He has mitral annular calcification but no sx recommended. Cardio had recommended increase in statin tx but pt declined. LDL 104. Carotid duplex revealed 1-39% stenosis b/l ICA in 2018  hx prostate CA and rec'd XRT for bone mets (left tibia and pelvis) - He is currently on leuprolide injection q73mos. He also takes cardura. PSA stable on Xtandi. He gets surveillance CT chest/abd/pelvis and whole body bone scan at [facility]. No changes compared to previous studies. Followed by urology Dr Rosana Hoes and oncology Dr Marcello Moores. LDH 121; PSA <0.01. He had ureteral stent change in May 2019 with Dr Rosana Hoes and stent changed q87mos. He takes prn tramadol when  stent changed. He has difficulty starting urine stream and some times needs to self cath. Albumin 3.6; Hgb 11.9  DM - BS 120s usually. He checks once weekly. No low BS reactions. He takes metformin. A1c 5.8%. Cr 1.20. Eye exam is UTD. No diabetic changes -Followed by Dr Delman Cheadle. He has noticed numbness/tingling in feet since beginning [place]. LDL 104 on low dose statin; urine microalbumin/Cr ratio 80  HTN - controlled on lisinopril-HCT and metoprolol. Takes ASA daily  Hyperlipidemia - improved on low dose simvastatin. Occasional muscle weakness. LDL 104 (prev 117)  Multiple joint pain - mostly in back. He takes prn tylenol  Hx anemia - stable. Hgb 11.9; Ferritin 30  CKD - stage 2. Cr 1.2. Stable   Aortic atherosclerosis - moderate per CT abd/pelvis in 2015. Also noted right common iliac artery aneurysm 1.9 cm.   Past Medical History:  Diagnosis Date  . Acute bronchitis 09/12/2012  . 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 Bone metastases ([place])    left mid tibial shaft  . Cataract    Dr.Groat  . Cellulitis and abscess of leg, except foot 01/17/2012  . Chest pain, unspecified 04/28/2004  . Closed fracture of five ribs 03/24/2004  . First degree atrioventricular block 04/25/2007  . Gross hematuria 09/06/2011  . History of radiation therapy 04/03/14- 04/17/14   mid to distal left tibia 3000 cGy in 10 sessions  . HTN (  hypertension)   . Hx of radiation therapy 11/1998   prostate fossa - 6040 cGy, 33 fx, Dr Danny Lawless  . Osteoarthrosis involving, or with mention of more than one site, but not specified as generalized, multiple sites 10/22/2010  . Other abnormal blood chemistry 04/29/1991  . Other and unspecified hyperlipidemia 01/02/2013  . Palpitations 04/28/2004  . Prostate cancer ([place]) 12/16/1994   gleason 7  . Reflux esophagitis 05/10/2008  . Routine general medical examination at a health care facility   . Spinal stenosis, unspecified region other than cervical 04/29/1995  . Type II or  unspecified type diabetes mellitus without mention of complication, uncontrolled     Past Surgical History:  Procedure Laterality Date  . AIR/FLUID EXCHANGE Left 01/09/2016   Procedure: AIR/FLUID EXCHANGE;  Surgeon: Jalene Mullet, MD;  Location: [place];  Service: Ophthalmology;  Laterality: Left;  . CYSTOSCOPY W/ URETERAL STENT PLACEMENT  12/31/14  . CYSTOSCOPY W/ URETERAL STENT PLACEMENT  05/16/2015  . EYE SURGERY  2006   cataract, Dr Shanon Rosser  . LASER PHOTO ABLATION Left 01/09/2016   Procedure: LASER PHOTO ABLATION;  Surgeon: Jalene Mullet, MD;  Location: [place];  Service: Ophthalmology;  Laterality: Left;  Endolaser  . Left ureteral stent placement  Week of 12/05/11  . PARS PLANA VITRECTOMY Left 01/09/2016   Procedure: PARS PLANA VITRECTOMY WITH 25 GAUGE LEFT EYE ;  Surgeon: Jalene Mullet, MD;  Location: [place];  Service: Ophthalmology;  Laterality: Left;  . PERFLUORONE INJECTION Left 01/09/2016   Procedure: PERFLUORONE INJECTION;  Surgeon: Jalene Mullet, MD;  Location: [place];  Service: Ophthalmology;  Laterality: Left;  . PROSTATECTOMY  1996   Dr. Rosana Hoes     reports that he quit smoking about 49 years ago. His smoking use included cigars. He quit after 5.00 years of use. He has never used smokeless tobacco. He reports that he does not drink alcohol or use drugs. Social History   Socioeconomic History  . Marital status: Married    Spouse name: Not on file  . Number of children: Not on file  . Years of education: Not on file  . Highest education level: Not on file  Occupational History  . Not on file  Social Needs  . Financial resource strain: Not on file  . Food insecurity:    Worry: Not on file    Inability: Not on file  . Transportation needs:    Medical: Not on file    Non-medical: Not on file  Tobacco Use  . Smoking status: Former Smoker    Years: 5.00    Types: Cigars    Last attempt to quit: 12/16/1968    Years since quitting: 49.6  . Smokeless tobacco: Never Used    Substance and Sexual Activity  . Alcohol use: No    Alcohol/week: 0.0 standard drinks  . Drug use: No  . Sexual activity: Never  Lifestyle  . Physical activity:    Days per week: Not on file    Minutes per session: Not on file  . Stress: Not on file  Relationships  . Social connections:    Talks on phone: Not on file    Gets together: Not on file    Attends religious service: Not on file    Active member of club or organization: Not on file    Attends meetings of clubs or organizations: Not on file    Relationship status: Not on file  . Intimate partner violence:    Fear of current or  ex partner: Not on file    Emotionally abused: Not on file    Physically abused: Not on file    Forced sexual activity: Not on file  Other Topics Concern  . Not on file  Social History Narrative  . Not on file    Family History  Problem Relation Age of Onset  . Heart disease Father   . Stroke Sister     Allergies  Allergen Reactions  . Abiraterone Other (See Comments) and Nausea And Vomiting    Other reaction(s): Increased Heart Rate (intolerance) Other reaction(s): Increased Heart Rate (intolerance) Other reaction(s): Increased Heart Rate (intolerance)    Outpatient Encounter Medications as of 07/25/2018  Medication Sig  . acetaminophen (TYLENOL) 650 MG CR tablet Take 650 mg by mouth as needed for pain.  Marland Kitchen aspirin 81 MG tablet Take 1 tablet (81 mg total) by mouth daily.  . Cholecalciferol 1000 UNITS tablet Take 1,000 Units by mouth daily.  Marland Kitchen doxazosin (CARDURA) 4 MG tablet Take 1 tablet (4 mg total) by mouth daily.  . enzalutamide (XTANDI) 40 MG capsule Take 120 mg by mouth daily.   Marland Kitchen glucose blood (ONETOUCH VERIO) test strip Check blood sugar one to two times a week. Dx:E11.29  . leuprolide, 6 Month, (LEUPROLIDE ACETATE, 6 MONTH,) 45 MG injection Inject 45 mg into the skin every 6 (six) months. Filled by [facility]  . lisinopril-hydrochlorothiazide (PRINZIDE,ZESTORETIC) 20-12.5  MG tablet TAKE 2 TABLETS BY MOUTH ONCE DAILY IN THE MORNING AND 1 TABLET IN THE EVENING FOR BLOOD PRESSURE (Patient taking differently: TAKE 1 TABLETS BY MOUTH ONCE DAILY IN THE MORNING AND 1 TABLET IN THE EVENING FOR BLOOD PRESSURE)  . metFORMIN (GLUCOPHAGE) 500 MG tablet TAKE 1 TABLET BY MOUTH ONCE DAILY WITH  BREAKFAST  TO  CONTROL  BLOOD  SUGAR.  . metoprolol tartrate (LOPRESSOR) 50 MG tablet TAKE 1 TABLET BY MOUTH ONCE DAILY  . Omega-3 Fatty Acids (FISH OIL) 1000 MG CAPS Take 1 capsule by mouth daily.  Marland Kitchen pyridoxine (B-6) 100 MG tablet Take 100 mg by mouth daily.  . simvastatin (ZOCOR) 5 MG tablet TAKE 1 TABLET BY MOUTH ONCE DAILY  . traMADol (ULTRAM) 50 MG tablet Take 50 mg by mouth as needed.    No facility-administered encounter medications on file as of 07/25/2018.     Review of Systems:  Review of Systems  Health Maintenance  Topic Date Due  . INFLUENZA VACCINE  06/15/2018  . OPHTHALMOLOGY EXAM  07/16/2018  . HEMOGLOBIN A1C  01/17/2019  . FOOT EXAM  04/15/2019  . TETANUS/TDAP  11/08/2023  . PNA vac Low Risk Adult  Completed    Physical Exam: Vitals:   07/25/18 1024  BP: 122/74  Pulse: 61  Temp: 98 F (36.7 C)  TempSrc: Oral  SpO2: 97%  Weight: 269 lb 9.6 oz (122.3 kg)  Height: 5\' 11"  (1.803 m)   Body mass index is 37.6 kg/m. Physical Exam  Constitutional: He is oriented to person, place, and time. He appears well-developed and well-nourished.  HENT:  Mouth/Throat: Oropharynx is clear and moist.  MMM; no oral thrush  Eyes: Pupils are equal, round, and reactive to light. No scleral icterus.  Neck: Neck supple. Muscular tenderness present. Carotid bruit is present. Decreased range of motion present. No thyromegaly present.    Cardiovascular: Normal rate, regular rhythm and intact distal pulses. Exam reveals no gallop and no friction rub.  Murmur (1/6 SEM) heard. +1 pitting LE edema b/; no calf TTP  Pulmonary/Chest:  Effort normal and breath sounds normal. No  stridor. No respiratory distress. He has no wheezes. He has no rales. He exhibits no tenderness.  Abdominal: Soft. Bowel sounds are normal. He exhibits no distension, no abdominal bruit, no pulsatile midline mass and no mass. There is no hepatomegaly. There is no tenderness. There is no rebound and no guarding.  obese  Musculoskeletal: He exhibits edema (small and large joints).  Lymphadenopathy:    He has no cervical adenopathy.  Neurological: He is alert and oriented to person, place, and time. He has normal reflexes.  Skin: Skin is warm and dry. No rash noted.  Psychiatric: He has a normal mood and affect. His behavior is normal. Judgment and thought content normal.    Labs reviewed: Basic Metabolic Panel: Recent Labs    04/12/18 0827 07/19/18 0818  NA 141 140  K 4.3 4.2  CL 109 107  CO2 25 26  GLUCOSE 121* 119*  BUN 27* 25  CREATININE 1.31* 1.20*  CALCIUM 8.6 8.9  TSH 1.80  --    Liver Function Tests: Recent Labs    04/12/18 0827 07/19/18 0818  AST 10 13  ALT 9 9  BILITOT 0.4 0.4  PROT 5.7* 6.1   No results for input(s): LIPASE, AMYLASE in the last 8760 hours. No results for input(s): AMMONIA in the last 8760 hours. CBC: Recent Labs    04/12/18 0827  WBC 6.9  NEUTROABS 4,244  HGB 11.9*  HCT 34.1*  MCV 83.0  PLT 192   Lipid Panel: Recent Labs    11/09/17 0826 04/12/18 0827 07/19/18 0818  CHOL 184 179 162  HDL 48 42 41  LDLCALC 110* 117* 104*  TRIG 143 94 78  CHOLHDL 3.8 4.3 4.0   Lab Results  Component Value Date   HGBA1C 5.8 (H) 07/19/2018    Procedures since last visit: No results found.  Assessment/Plan   ICD-10-CM   1. Primary osteoarthritis involving multiple joints M15.0   2. Diabetes mellitus due to underlying condition, controlled, with diabetic nephropathy, without long-term current use of insulin (HCC) E08.21   3. Mixed hyperlipidemia E78.2   4. Bilateral edema of lower extremity R60.0   5. Malignant neoplasm of prostate ([place])  C61   6. Essential hypertension, benign I10    Continue current medications as ordered  Follow up with cardiology in October for annual visit  Follow up with other specialists as scheduled  He declined flu shot today - RECOMMEND YOU GET YOUR FLU SHOT IN October; [place]  Follow up in 4 mos with Janett Billow for DM, HTN, hyperlipidemia, joint pain, prostate cancer. Fasting labs prior to appt (cbc, cmp, lipid panel, a1c, urine microalbumin/creatinine ratio, ferritin)   Klaus Casteneda S. Perlie Gold  [facility] [address] [place], [place] [address] [phone] Cell (Monday-Friday 8 AM - 5 PM) [phone] After 5 PM and follow prompts

## 2018-08-28 ENCOUNTER — Other Ambulatory Visit: Payer: Self-pay | Admitting: Internal Medicine

## 2018-09-07 DIAGNOSIS — C44329 Squamous cell carcinoma of skin of other parts of face: Secondary | ICD-10-CM | POA: Diagnosis not present

## 2018-09-07 DIAGNOSIS — B079 Viral wart, unspecified: Secondary | ICD-10-CM | POA: Diagnosis not present

## 2018-09-07 DIAGNOSIS — Z85828 Personal history of other malignant neoplasm of skin: Secondary | ICD-10-CM | POA: Diagnosis not present

## 2018-09-07 DIAGNOSIS — D485 Neoplasm of uncertain behavior of skin: Secondary | ICD-10-CM | POA: Diagnosis not present

## 2018-09-07 DIAGNOSIS — L905 Scar conditions and fibrosis of skin: Secondary | ICD-10-CM | POA: Diagnosis not present

## 2018-09-12 DIAGNOSIS — I10 Essential (primary) hypertension: Secondary | ICD-10-CM | POA: Diagnosis not present

## 2018-09-12 DIAGNOSIS — C7951 Secondary malignant neoplasm of bone: Secondary | ICD-10-CM | POA: Diagnosis not present

## 2018-09-12 DIAGNOSIS — E119 Type 2 diabetes mellitus without complications: Secondary | ICD-10-CM | POA: Diagnosis not present

## 2018-09-12 DIAGNOSIS — Z7984 Long term (current) use of oral hypoglycemic drugs: Secondary | ICD-10-CM | POA: Diagnosis not present

## 2018-09-12 DIAGNOSIS — G629 Polyneuropathy, unspecified: Secondary | ICD-10-CM | POA: Diagnosis not present

## 2018-09-12 DIAGNOSIS — R2681 Unsteadiness on feet: Secondary | ICD-10-CM | POA: Diagnosis not present

## 2018-09-12 DIAGNOSIS — Z23 Encounter for immunization: Secondary | ICD-10-CM | POA: Diagnosis not present

## 2018-09-12 DIAGNOSIS — C61 Malignant neoplasm of prostate: Secondary | ICD-10-CM | POA: Diagnosis not present

## 2018-09-15 ENCOUNTER — Ambulatory Visit (INDEPENDENT_AMBULATORY_CARE_PROVIDER_SITE_OTHER): Payer: Medicare Other

## 2018-09-15 VITALS — BP 136/86 | HR 68 | Temp 98.5°F | Ht 71.0 in | Wt 266.0 lb

## 2018-09-15 DIAGNOSIS — Z0000 Encounter for general adult medical examination without abnormal findings: Secondary | ICD-10-CM

## 2018-09-15 NOTE — Patient Instructions (Addendum)
Jeremiah Collins , Thank you for taking time to come for your Medicare Wellness Visit. I appreciate your ongoing commitment to your health goals. Please review the following plan we discussed and let me know if I can assist you in the future.   Screening recommendations/referrals: Colonoscopy excluded, over age 82 Recommended yearly ophthalmology/optometry visit for glaucoma screening and checkup Recommended yearly dental visit for hygiene and checkup  Vaccinations: Influenza vaccine up to date Pneumococcal vaccine up to date, completed Tdap vaccine up to date, due 11/08/2023 Shingles vaccine due, declined for now    Advanced directives: Advance directive discussed with you today. I have provided a copy for you to complete at home and have notarized. Once this is complete please bring a copy in to our office so we can scan it into your chart.  Conditions/risks identified: None  Next appointment: Sherrie Mustache, NP 11/24/2018 @ 8:30am            Tyson Dense, RN 09/19/2019 @ 8:30am  Preventive Care 65 Years and Older, Male Preventive care refers to lifestyle choices and visits with your health care provider that can promote health and wellness. What does preventive care include?  A yearly physical exam. This is also called an annual well check.  Dental exams once or twice a year.  Routine eye exams. Ask your health care provider how often you should have your eyes checked.  Personal lifestyle choices, including:  Daily care of your teeth and gums.  Regular physical activity.  Eating a healthy diet.  Avoiding tobacco and drug use.  Limiting alcohol use.  Practicing safe sex.  Taking low doses of aspirin every day.  Taking vitamin and mineral supplements as recommended by your health care provider. What happens during an annual well check? The services and screenings done by your health care provider during your annual well check will depend on your age, overall health,  lifestyle risk factors, and family history of disease. Counseling  Your health care provider may ask you questions about your:  Alcohol use.  Tobacco use.  Drug use.  Emotional well-being.  Home and relationship well-being.  Sexual activity.  Eating habits.  History of falls.  Memory and ability to understand (cognition).  Work and work Statistician. Screening  You may have the following tests or measurements:  Height, weight, and BMI.  Blood pressure.  Lipid and cholesterol levels. These may be checked every 5 years, or more frequently if you are over 69 years old.  Skin check.  Lung cancer screening. You may have this screening every year starting at age 49 if you have a 30-pack-year history of smoking and currently smoke or have quit within the past 15 years.  Fecal occult blood test (FOBT) of the stool. You may have this test every year starting at age 56.  Flexible sigmoidoscopy or colonoscopy. You may have a sigmoidoscopy every 5 years or a colonoscopy every 10 years starting at age 78.  Prostate cancer screening. Recommendations will vary depending on your family history and other risks.  Hepatitis C blood test.  Hepatitis B blood test.  Sexually transmitted disease (STD) testing.  Diabetes screening. This is done by checking your blood sugar (glucose) after you have not eaten for a while (fasting). You may have this done every 1-3 years.  Abdominal aortic aneurysm (AAA) screening. You may need this if you are a current or former smoker.  Osteoporosis. You may be screened starting at age 86 if you are at high risk.  Talk with your health care provider about your test results, treatment options, and if necessary, the need for more tests. Vaccines  Your health care provider may recommend certain vaccines, such as:  Influenza vaccine. This is recommended every year.  Tetanus, diphtheria, and acellular pertussis (Tdap, Td) vaccine. You may need a Td booster  every 10 years.  Zoster vaccine. You may need this after age 33.  Pneumococcal 13-valent conjugate (PCV13) vaccine. One dose is recommended after age 47.  Pneumococcal polysaccharide (PPSV23) vaccine. One dose is recommended after age 46. Talk to your health care provider about which screenings and vaccines you need and how often you need them. This information is not intended to replace advice given to you by your health care provider. Make sure you discuss any questions you have with your health care provider. Document Released: 11/28/2015 Document Revised: 07/21/2016 Document Reviewed: 09/02/2015 Elsevier Interactive Patient Education  2017 [place] Prevention in the Home Falls can cause injuries. They can happen to people of all ages. There are many things you can do to make your home safe and to help prevent falls. What can I do on the outside of my home?  Regularly fix the edges of walkways and driveways and fix any cracks.  Remove anything that might make you trip as you walk through a door, such as a raised step or threshold.  Trim any bushes or trees on the path to your home.  Use bright outdoor lighting.  Clear any walking paths of anything that might make someone trip, such as rocks or tools.  Regularly check to see if handrails are loose or broken. Make sure that both sides of any steps have handrails.  Any raised decks and porches should have guardrails on the edges.  Have any leaves, snow, or ice cleared regularly.  Use sand or salt on walking paths during winter.  Clean up any spills in your garage right away. This includes oil or grease spills. What can I do in the bathroom?  Use night lights.  Install grab bars by the toilet and in the tub and shower. Do not use towel bars as grab bars.  Use non-skid mats or decals in the tub or shower.  If you need to sit down in the shower, use a plastic, non-slip stool.  Keep the floor dry. Clean up any  water that spills on the floor as soon as it happens.  Remove soap buildup in the tub or shower regularly.  Attach bath mats securely with double-sided non-slip rug tape.  Do not have throw rugs and other things on the floor that can make you trip. What can I do in the bedroom?  Use night lights.  Make sure that you have a light by your bed that is easy to reach.  Do not use any sheets or blankets that are too big for your bed. They should not hang down onto the floor.  Have a firm chair that has side arms. You can use this for support while you get dressed.  Do not have throw rugs and other things on the floor that can make you trip. What can I do in the kitchen?  Clean up any spills right away.  Avoid walking on wet floors.  Keep items that you use a lot in easy-to-reach places.  If you need to reach something above you, use a strong step stool that has a grab bar.  Keep electrical cords out of the way.  Do not use floor polish or wax that makes floors slippery. If you must use wax, use non-skid floor wax.  Do not have throw rugs and other things on the floor that can make you trip. What can I do with my stairs?  Do not leave any items on the stairs.  Make sure that there are handrails on both sides of the stairs and use them. Fix handrails that are broken or loose. Make sure that handrails are as long as the stairways.  Check any carpeting to make sure that it is firmly attached to the stairs. Fix any carpet that is loose or worn.  Avoid having throw rugs at the top or bottom of the stairs. If you do have throw rugs, attach them to the floor with carpet tape.  Make sure that you have a light switch at the top of the stairs and the bottom of the stairs. If you do not have them, ask someone to add them for you. What else can I do to help prevent falls?  Wear shoes that:  Do not have high heels.  Have rubber bottoms.  Are comfortable and fit you well.  Are closed  at the toe. Do not wear sandals.  If you use a stepladder:  Make sure that it is fully opened. Do not climb a closed stepladder.  Make sure that both sides of the stepladder are locked into place.  Ask someone to hold it for you, if possible.  Clearly mark and make sure that you can see:  Any grab bars or handrails.  First and last steps.  Where the edge of each step is.  Use tools that help you move around (mobility aids) if they are needed. These include:  Canes.  Walkers.  Scooters.  Crutches.  Turn on the lights when you go into a dark area. Replace any light bulbs as soon as they burn out.  Set up your furniture so you have a clear path. Avoid moving your furniture around.  If any of your floors are uneven, fix them.  If there are any pets around you, be aware of where they are.  Review your medicines with your doctor. Some medicines can make you feel dizzy. This can increase your chance of falling. Ask your doctor what other things that you can do to help prevent falls. This information is not intended to replace advice given to you by your health care provider. Make sure you discuss any questions you have with your health care provider. Document Released: 08/28/2009 Document Revised: 04/08/2016 Document Reviewed: 12/06/2014 Elsevier Interactive Patient Education  2017 [employer].

## 2018-09-15 NOTE — Progress Notes (Signed)
Subjective:   Jeremiah Collins is a 84 y.o. male who presents for Medicare Annual/Subsequent preventive examination.  Last AWV-09/05/2017       Objective:    Vitals: BP 136/86 (BP Location: Left Arm, Patient Position: Sitting)   Pulse 68   Temp 98.5 F (36.9 C) (Oral)   Ht 5\' 11"  (1.803 m)   Wt 266 lb (120.7 kg)   SpO2 95%   BMI 37.10 kg/m   Body mass index is 37.1 kg/m.  Advanced Directives 09/15/2018 07/25/2018 04/14/2018 09/05/2017 06/10/2017 01/07/2017 11/17/2016  Does Patient Have a Medical Advance Directive? No No No No No No No  Would patient like information on creating a medical advance directive? No - Patient declined - No - Patient declined No - Patient declined No - Patient declined No - Patient declined No - Patient declined    Tobacco Social History   Tobacco Use  Smoking Status Former Smoker  . Years: 5.00  . Types: Cigars  . Last attempt to quit: 12/16/1968  . Years since quitting: 49.7  Smokeless Tobacco Never Used     Counseling given: Not Answered   Clinical Intake:  Pre-visit preparation completed: No  Pain : No/denies pain     Diabetes: Yes CBG done?: No Did Jeremiah Collins. bring in CBG monitor from home?: No  How often do you need to have someone help you when you read instructions, pamphlets, or other written materials from your doctor or pharmacy?: 1 - Never What is the last grade level you completed in school?: 12th grade  Interpreter Needed?: No  Information entered by :: Tyson Dense, RN  Past Medical History:  Diagnosis Date  . Acute bronchitis 09/12/2012  . 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 Bone metastases ([place])    left mid tibial shaft  . Cataract    Dr.Groat  . Cellulitis and abscess of leg, except foot 01/17/2012  . Chest pain, unspecified 04/28/2004  . Closed fracture of five ribs 03/24/2004  . First degree atrioventricular block 04/25/2007  . Gross hematuria 09/06/2011  . History of radiation therapy 04/03/14- 04/17/14   mid to distal  left tibia 3000 cGy in 10 sessions  . HTN (hypertension)   . Hx of radiation therapy 11/1998   prostate fossa - 6040 cGy, 33 fx, Dr Danny Lawless  . Osteoarthrosis involving, or with mention of more than one site, but not specified as generalized, multiple sites 10/22/2010  . Other abnormal blood chemistry 04/29/1991  . Other and unspecified hyperlipidemia 01/02/2013  . Palpitations 04/28/2004  . Prostate cancer ([place]) 12/16/1994   gleason 7  . Reflux esophagitis 05/10/2008  . Routine general medical examination at a health care facility   . Spinal stenosis, unspecified region other than cervical 04/29/1995  . Type II or unspecified type diabetes mellitus without mention of complication, uncontrolled    Past Surgical History:  Procedure Laterality Date  . AIR/FLUID EXCHANGE Left 01/09/2016   Procedure: AIR/FLUID EXCHANGE;  Surgeon: Jalene Mullet, MD;  Location: [facility];  Service: Ophthalmology;  Laterality: Left;  . CYSTOSCOPY W/ URETERAL STENT PLACEMENT  12/31/14  . CYSTOSCOPY W/ URETERAL STENT PLACEMENT  05/16/2015  . EYE SURGERY  2006   cataract, Dr Shanon Rosser  . LASER PHOTO ABLATION Left 01/09/2016   Procedure: LASER PHOTO ABLATION;  Surgeon: Jalene Mullet, MD;  Location: [facility];  Service: Ophthalmology;  Laterality: Left;  Endolaser  . Left ureteral stent placement  Week of 12/05/11  . PARS PLANA VITRECTOMY Left 01/09/2016   Procedure: PARS PLANA  VITRECTOMY WITH 25 GAUGE LEFT EYE ;  Surgeon: Jalene Mullet, MD;  Location: [facility];  Service: Ophthalmology;  Laterality: Left;  . PERFLUORONE INJECTION Left 01/09/2016   Procedure: PERFLUORONE INJECTION;  Surgeon: Jalene Mullet, MD;  Location: [facility];  Service: Ophthalmology;  Laterality: Left;  . PROSTATECTOMY  1996   Dr. Rosana Hoes   Family History  Problem Relation Age of Onset  . Heart disease Father   . Stroke Sister    Social History   Socioeconomic History  . Marital status: Married    Spouse name: Not on file  . Number of children: Not on file    . Years of education: Not on file  . Highest education level: Not on file  Occupational History  . Not on file  Social Needs  . Financial resource strain: Not hard at all  . Food insecurity:    Worry: Never true    Inability: Never true  . Transportation needs:    Medical: No    Non-medical: No  Tobacco Use  . Smoking status: Former Smoker    Years: 5.00    Types: Cigars    Last attempt to quit: 12/16/1968    Years since quitting: 49.7  . Smokeless tobacco: Never Used  Substance and Sexual Activity  . Alcohol use: No    Alcohol/week: 0.0 standard drinks  . Drug use: No  . Sexual activity: Never  Lifestyle  . Physical activity:    Days per week: 0 days    Minutes per session: 0 min  . Stress: Not at all  Relationships  . Social connections:    Talks on phone: Three times a week    Gets together: Once a week    Attends religious service: Never    Active member of club or organization: No    Attends meetings of clubs or organizations: Never    Relationship status: Married  Other Topics Concern  . Not on file  Social History Narrative  . Not on file    Outpatient Encounter Medications as of 09/15/2018  Medication Sig  . acetaminophen (TYLENOL) 650 MG CR tablet Take 650 mg by mouth as needed for pain.  Marland Kitchen aspirin 81 MG tablet Take 1 tablet (81 mg total) by mouth daily.  . Cholecalciferol 1000 UNITS tablet Take 1,000 Units by mouth daily.  Marland Kitchen doxazosin (CARDURA) 4 MG tablet Take 1 tablet (4 mg total) by mouth daily.  Marland Kitchen doxazosin (CARDURA) 4 MG tablet TAKE 1 TABLET BY MOUTH ONCE DAILY  . enzalutamide (XTANDI) 40 MG capsule Take 120 mg by mouth daily.   Marland Kitchen glucose blood (ONETOUCH VERIO) test strip Check blood sugar one to two times a week. Dx:E11.29  . leuprolide, 6 Month, (LEUPROLIDE ACETATE, 6 MONTH,) 45 MG injection Inject 45 mg into the skin every 6 (six) months. Filled by [facility]  . lisinopril-hydrochlorothiazide (PRINZIDE,ZESTORETIC) 20-12.5 MG tablet TAKE 2  TABLETS BY MOUTH ONCE DAILY IN THE MORNING AND 1 TABLET IN THE EVENING FOR BLOOD PRESSURE  . metFORMIN (GLUCOPHAGE) 500 MG tablet TAKE 1 TABLET BY MOUTH ONCE DAILY WITH  BREAKFAST  TO  CONTROL  BLOOD  SUGAR.  . metoprolol tartrate (LOPRESSOR) 50 MG tablet TAKE 1 TABLET BY MOUTH ONCE DAILY  . Omega-3 Fatty Acids (FISH OIL) 1000 MG CAPS Take 1 capsule by mouth daily.  Marland Kitchen pyridoxine (B-6) 100 MG tablet Take 100 mg by mouth daily.  . simvastatin (ZOCOR) 5 MG tablet TAKE 1 TABLET BY MOUTH ONCE DAILY  .  traMADol (ULTRAM) 50 MG tablet Take 50 mg by mouth as needed.    No facility-administered encounter medications on file as of 09/15/2018.     Activities of Daily Living In your present state of health, do you have any difficulty performing the following activities: 09/15/2018  Hearing? N  Vision? N  Difficulty concentrating or making decisions? N  Walking or climbing stairs? N  Dressing or bathing? N  Doing errands, shopping? N  Preparing Food and eating ? N  Using the Toilet? N  In the past six months, have you accidently leaked urine? N  Do you have problems with loss of bowel control? N  Managing your Medications? N  Managing your Finances? N  Housekeeping or managing your Housekeeping? N  Some recent data might be hidden    Patient Care Team: [name], DO as PCP - General (Internal Medicine) Myrlene Broker, MD as Attending Physician (Urology) [name], MD as Consulting Physician (Ophthalmology) [name], MD as Consulting Physician (Ophthalmology)   Assessment:   This is a routine wellness examination for Jeremiah Collins.  Exercise Activities and Dietary recommendations Current Exercise Habits: The patient does not participate in regular exercise at present, Exercise limited by: None identified  Goals    . Increase water intake     Starting 09/03/16, I will attempt to increase my water intake by 2 cups.     Jeremiah Collins LIfestyle     Starting today Jeremiah Collins will maintain  lifestyle.        Fall Risk Fall Risk  09/15/2018 07/25/2018 04/14/2018 11/11/2017 09/05/2017  Falls in the past year? 0 No No No No  Number falls in past yr: 0 - - - -   Is the patient's home free of loose throw rugs in walkways, pet beds, electrical cords, etc?   yes      Grab bars in the bathroom? yes      Handrails on the stairs?   yes      Adequate lighting?   yes  Depression Screen PHQ 2/9 Scores 09/15/2018 04/14/2018 11/11/2017 09/05/2017  PHQ - 2 Score 0 0 0 0    Cognitive Function MMSE - Mini Mental State Exam 09/05/2017 09/03/2016 09/10/2014  Orientation to time 5 5 5   Orientation to Place 5 5 5   Registration 3 3 3   Attention/ Calculation 4 5 5   Recall 1 3 3   Language- name 2 objects 2 2 2   Language- repeat 1 1 1   Language- follow 3 step command 3 3 3   Language- read & follow direction 1 1 1   Write a sentence 1 0 1  Copy design 1 1 1   Total score 27 29 30         Immunization History  Administered Date(s) Administered  . Influenza Whole 08/13/2010, 08/16/2011  . Influenza, High Dose Seasonal PF 08/19/2016, 09/12/2018  . Influenza, Quadrivalent, Recombinant, Inj, Pf 07/18/2013, 08/20/2015  . Influenza,inj,Quad PF,6+ Mos 07/18/2013, 08/20/2015, 09/05/2017  . Influenza-Unspecified 09/01/2016  . Pneumococcal Conjugate-13 01/14/2015  . Pneumococcal Polysaccharide-23 09/06/2011  . Tdap 11/07/2013    Qualifies for Shingles Vaccine? Yes, educated and declined for now  Screening Tests Health Maintenance  Topic Date Due  . INFLUENZA VACCINE  06/15/2018  . OPHTHALMOLOGY EXAM  07/16/2018  . HEMOGLOBIN A1C  01/17/2019  . FOOT EXAM  04/15/2019  . TETANUS/TDAP  11/08/2023  . PNA vac Low Risk Adult  Completed   Cancer Screenings: Lung: Low Dose CT Chest recommended if Age 69-80 years, 40  pack-year currently smoking OR have quit w/in 15years. Patient does not qualify. Colorectal: up to date  Additional Screenings:  Hepatitis C Screening: declined  Annual  Diabetic eye exams completed by Dr. Nicki Guadalajara requested    Plan:    I have personally reviewed and addressed the Medicare Annual Wellness questionnaire and have noted the following in the patient's chart:  A. Medical and social history B. Use of alcohol, tobacco or illicit drugs  C. Current medications and supplements D. Functional ability and status E.  Nutritional status F.  Physical activity G. Advance directives H. List of other physicians I.  Hospitalizations, surgeries, and ER visits in previous 12 months J.  [place] to include hearing, vision, cognitive, depression L. Referrals and appointments - none  In addition, I have reviewed and discussed with patient certain preventive protocols, quality metrics, and best practice recommendations. A written personalized care plan for preventive services as well as general preventive health recommendations were provided to patient.  See attached scanned questionnaire for additional information.   Signed,   Tyson Dense, RN Nurse Health Advisor  Patient Concerns: None

## 2018-09-19 DIAGNOSIS — C7982 Secondary malignant neoplasm of genital organs: Secondary | ICD-10-CM | POA: Diagnosis not present

## 2018-09-19 DIAGNOSIS — M47812 Spondylosis without myelopathy or radiculopathy, cervical region: Secondary | ICD-10-CM | POA: Diagnosis not present

## 2018-09-19 DIAGNOSIS — Z466 Encounter for fitting and adjustment of urinary device: Secondary | ICD-10-CM | POA: Diagnosis not present

## 2018-09-19 DIAGNOSIS — Z87891 Personal history of nicotine dependence: Secondary | ICD-10-CM | POA: Diagnosis not present

## 2018-09-19 DIAGNOSIS — Z7984 Long term (current) use of oral hypoglycemic drugs: Secondary | ICD-10-CM | POA: Diagnosis not present

## 2018-09-19 DIAGNOSIS — Z9079 Acquired absence of other genital organ(s): Secondary | ICD-10-CM | POA: Diagnosis not present

## 2018-09-19 DIAGNOSIS — N35919 Unspecified urethral stricture, male, unspecified site: Secondary | ICD-10-CM | POA: Diagnosis not present

## 2018-09-19 DIAGNOSIS — N320 Bladder-neck obstruction: Secondary | ICD-10-CM | POA: Diagnosis not present

## 2018-09-19 DIAGNOSIS — N183 Chronic kidney disease, stage 3 (moderate): Secondary | ICD-10-CM | POA: Diagnosis not present

## 2018-09-19 DIAGNOSIS — I350 Nonrheumatic aortic (valve) stenosis: Secondary | ICD-10-CM | POA: Diagnosis not present

## 2018-09-19 DIAGNOSIS — N135 Crossing vessel and stricture of ureter without hydronephrosis: Secondary | ICD-10-CM | POA: Diagnosis not present

## 2018-09-19 DIAGNOSIS — E1140 Type 2 diabetes mellitus with diabetic neuropathy, unspecified: Secondary | ICD-10-CM | POA: Diagnosis not present

## 2018-09-19 DIAGNOSIS — E1122 Type 2 diabetes mellitus with diabetic chronic kidney disease: Secondary | ICD-10-CM | POA: Diagnosis not present

## 2018-09-19 DIAGNOSIS — Z923 Personal history of irradiation: Secondary | ICD-10-CM | POA: Diagnosis not present

## 2018-09-19 DIAGNOSIS — I6523 Occlusion and stenosis of bilateral carotid arteries: Secondary | ICD-10-CM | POA: Diagnosis not present

## 2018-09-19 DIAGNOSIS — C7951 Secondary malignant neoplasm of bone: Secondary | ICD-10-CM | POA: Diagnosis not present

## 2018-09-19 DIAGNOSIS — I129 Hypertensive chronic kidney disease with stage 1 through stage 4 chronic kidney disease, or unspecified chronic kidney disease: Secondary | ICD-10-CM | POA: Diagnosis not present

## 2018-09-19 DIAGNOSIS — Z8546 Personal history of malignant neoplasm of prostate: Secondary | ICD-10-CM | POA: Diagnosis not present

## 2018-09-19 DIAGNOSIS — E785 Hyperlipidemia, unspecified: Secondary | ICD-10-CM | POA: Diagnosis not present

## 2018-09-22 DIAGNOSIS — E119 Type 2 diabetes mellitus without complications: Secondary | ICD-10-CM | POA: Diagnosis not present

## 2018-09-22 LAB — HM DIABETES EYE EXAM

## 2018-10-31 ENCOUNTER — Other Ambulatory Visit: Payer: Self-pay | Admitting: *Deleted

## 2018-10-31 DIAGNOSIS — I517 Cardiomegaly: Secondary | ICD-10-CM

## 2018-10-31 DIAGNOSIS — I10 Essential (primary) hypertension: Secondary | ICD-10-CM

## 2018-10-31 MED ORDER — METOPROLOL TARTRATE 50 MG PO TABS: 50.0000 mg | ORAL_TABLET | Freq: Every day | ORAL | 1 refills | Status: DC

## 2018-10-31 NOTE — Telephone Encounter (Signed)
[facility]

## 2018-11-03 ENCOUNTER — Other Ambulatory Visit: Payer: Self-pay

## 2018-11-03 MED ORDER — GLUCOSE BLOOD VI STRP: ORAL_STRIP | 3 refills | Status: DC

## 2018-11-08 ENCOUNTER — Encounter (HOSPITAL_COMMUNITY): Payer: Self-pay | Admitting: Emergency Medicine

## 2018-11-08 ENCOUNTER — Other Ambulatory Visit: Payer: Self-pay

## 2018-11-08 ENCOUNTER — Emergency Department (HOSPITAL_COMMUNITY)
Admission: EM | Admit: 2018-11-08 | Discharge: 2018-11-08 | Disposition: A | Discharge status: Home / Self Care | Payer: Medicare Other | Attending: Emergency Medicine | Admitting: Emergency Medicine

## 2018-11-08 DIAGNOSIS — Z7984 Long term (current) use of oral hypoglycemic drugs: Secondary | ICD-10-CM | POA: Diagnosis not present

## 2018-11-08 DIAGNOSIS — I10 Essential (primary) hypertension: Secondary | ICD-10-CM

## 2018-11-08 DIAGNOSIS — B029 Zoster without complications: Secondary | ICD-10-CM | POA: Insufficient documentation

## 2018-11-08 DIAGNOSIS — E119 Type 2 diabetes mellitus without complications: Secondary | ICD-10-CM | POA: Diagnosis not present

## 2018-11-08 DIAGNOSIS — Z8546 Personal history of malignant neoplasm of prostate: Secondary | ICD-10-CM | POA: Diagnosis not present

## 2018-11-08 DIAGNOSIS — R51 Headache: Secondary | ICD-10-CM | POA: Diagnosis present

## 2018-11-08 DIAGNOSIS — Z7982 Long term (current) use of aspirin: Secondary | ICD-10-CM | POA: Insufficient documentation

## 2018-11-08 DIAGNOSIS — Z79899 Other long term (current) drug therapy: Secondary | ICD-10-CM | POA: Diagnosis not present

## 2018-11-08 DIAGNOSIS — B028 Zoster with other complications: Secondary | ICD-10-CM

## 2018-11-08 DIAGNOSIS — Z87891 Personal history of nicotine dependence: Secondary | ICD-10-CM | POA: Diagnosis not present

## 2018-11-08 LAB — CBC WITH DIFFERENTIAL/PLATELET
Abs Immature Granulocytes: 0.03 10*3/uL (ref 0.00–0.07)
Basophils Absolute: 0 10*3/uL (ref 0.0–0.1)
Basophils Relative: 0 %
Eosinophils Absolute: 0 10*3/uL (ref 0.0–0.5)
Eosinophils Relative: 1 %
HEMATOCRIT: 36.8 % — AB (ref 39.0–52.0)
HEMOGLOBIN: 12.3 g/dL — AB (ref 13.0–17.0)
Immature Granulocytes: 0 %
LYMPHS PCT: 19 %
Lymphs Abs: 1.6 10*3/uL (ref 0.7–4.0)
MCH: 28.4 pg (ref 26.0–34.0)
MCHC: 33.4 g/dL (ref 30.0–36.0)
MCV: 85 fL (ref 80.0–100.0)
Monocytes Absolute: 0.4 10*3/uL (ref 0.1–1.0)
Monocytes Relative: 4 %
Neutro Abs: 6.2 10*3/uL (ref 1.7–7.7)
Neutrophils Relative %: 76 %
Platelets: 195 10*3/uL (ref 150–400)
RBC: 4.33 MIL/uL (ref 4.22–5.81)
RDW: 13.5 % (ref 11.5–15.5)
WBC: 8.2 10*3/uL (ref 4.0–10.5)
nRBC: 0 % (ref 0.0–0.2)

## 2018-11-08 LAB — COMPREHENSIVE METABOLIC PANEL
ALT: 12 U/L (ref 0–44)
AST: 17 U/L (ref 15–41)
Albumin: 3.1 g/dL — ABNORMAL LOW (ref 3.5–5.0)
Alkaline Phosphatase: 70 U/L (ref 38–126)
Anion gap: 10 (ref 5–15)
BILIRUBIN TOTAL: 0.5 mg/dL (ref 0.3–1.2)
BUN: 17 mg/dL (ref 8–23)
CO2: 20 mmol/L — ABNORMAL LOW (ref 22–32)
CREATININE: 1.05 mg/dL (ref 0.61–1.24)
Calcium: 8.7 mg/dL — ABNORMAL LOW (ref 8.9–10.3)
Chloride: 106 mmol/L (ref 98–111)
GFR calc Af Amer: >60 mL/min (ref >60)
Glucose, Bld: 161 mg/dL — ABNORMAL HIGH (ref 70–99)
Potassium: 3.7 mmol/L (ref 3.5–5.1)
Sodium: 136 mmol/L (ref 135–145)
Total Protein: 6.1 g/dL — ABNORMAL LOW (ref 6.5–8.1)

## 2018-11-08 LAB — I-STAT TROPONIN, ED: TROPONIN I, POC: 0.02 ng/mL (ref 0.00–0.08)

## 2018-11-08 MED ORDER — MORPHINE SULFATE (PF) 4 MG/ML IV SOLN
4.0000 mg | Freq: Once | INTRAVENOUS | Status: AC
  Administered 2018-11-08: 4 mg via INTRAVENOUS
  Filled 2018-11-08: qty 1

## 2018-11-08 MED ORDER — VALACYCLOVIR HCL 1 G PO TABS: 1000.0000 mg | ORAL_TABLET | Freq: Three times a day (TID) | ORAL | 0 refills | Status: AC

## 2018-11-08 MED ORDER — PREDNISONE 20 MG PO TABS
60.0000 mg | ORAL_TABLET | Freq: Once | ORAL | Status: AC
  Administered 2018-11-08: 60 mg via ORAL
  Filled 2018-11-08: qty 3

## 2018-11-08 MED ORDER — ONDANSETRON HCL 4 MG/2ML IJ SOLN
4.0000 mg | Freq: Once | INTRAMUSCULAR | Status: AC
  Administered 2018-11-08: 4 mg via INTRAVENOUS
  Filled 2018-11-08: qty 2

## 2018-11-08 MED ORDER — PREDNISONE 20 MG PO TABS: ORAL_TABLET | ORAL | 0 refills | Status: DC

## 2018-11-08 MED ORDER — HYDROCODONE-ACETAMINOPHEN 5-325 MG PO TABS: 1.0000 | ORAL_TABLET | Freq: Four times a day (QID) | ORAL | 0 refills | Status: DC | PRN

## 2018-11-08 MED ORDER — VALACYCLOVIR HCL 500 MG PO TABS
1000.0000 mg | ORAL_TABLET | Freq: Once | ORAL | Status: AC
  Administered 2018-11-08: 1000 mg via ORAL
  Filled 2018-11-08: qty 2

## 2018-11-08 MED ORDER — GABAPENTIN 300 MG PO CAPS: 300.0000 mg | ORAL_CAPSULE | Freq: Two times a day (BID) | ORAL | 0 refills | Status: DC

## 2018-11-08 MED ORDER — FLUORESCEIN SODIUM 1 MG OP STRP
1.0000 | ORAL_STRIP | Freq: Once | OPHTHALMIC | Status: AC
  Administered 2018-11-08: 1 via OPHTHALMIC
  Filled 2018-11-08: qty 1

## 2018-11-08 MED ORDER — TETRACAINE HCL 0.5 % OP SOLN
1.0000 [drp] | Freq: Once | OPHTHALMIC | Status: AC
  Administered 2018-11-08: 1 [drp] via OPHTHALMIC
  Filled 2018-11-08: qty 4

## 2018-11-08 MED ORDER — ERYTHROMYCIN 5 MG/GM OP OINT: TOPICAL_OINTMENT | OPHTHALMIC | 0 refills | Status: DC

## 2018-11-08 NOTE — Discharge Instructions (Addendum)
You have shingles in your face. Take prednisone and valtrex as prescribed   Use erythromycin ointment to your left eye twice daily to prevent infection   Take motrin, tylenol for pain   Take vicodin for severe pain.   You can also try gabapentin for pain   Expect that the lesions will increase and will start crusting over.   See your doctor next week for follow up.   See eye doctor in a week if your eye is red and painful   Return to ER if you have fever, vomiting, severe headaches, neck pain, weakness.

## 2018-11-08 NOTE — ED Notes (Signed)
Got patient unhooked from the monitor took patient saline lock out patient getting dressed with family at bedside

## 2018-11-08 NOTE — ED Provider Notes (Signed)
[facility] EMERGENCY DEPARTMENT Provider Note   CSN: [id number] Arrival date & time: 11/08/18  2426     History   Chief Complaint Chief Complaint  Patient presents with  . Headache  . Herpes Zoster    HPI Jeremiah Collins is a 84 y.o. male history of hypertension, prostate cancer here presenting with headache, left eye pain.  Patient has left-sided headache for the last 2 to 3 days.  Also some left eye pain as well.  Denies any purulent drainage from the or fevers.  He did have some URI symptoms prior to this episode.  He was noted to have a rash in the left forehead the triage nurse but he did not notice any rash yesterday.  Patient states that he may have gotten chickenpox as a child but is not very certain.  Denies any sick contacts or new soaps or shampoos or new medicines.   The history is provided by the patient.    Past Medical History:  Diagnosis Date  . Acute bronchitis 09/12/2012  . 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 Bone metastases ([place])    left mid tibial shaft  . Cataract    Dr.Groat  . Cellulitis and abscess of leg, except foot 01/17/2012  . Chest pain, unspecified 04/28/2004  . Closed fracture of five ribs 03/24/2004  . First degree atrioventricular block 04/25/2007  . Gross hematuria 09/06/2011  . History of radiation therapy 04/03/14- 04/17/14   mid to distal left tibia 3000 cGy in 10 sessions  . HTN (hypertension)   . Hx of radiation therapy 11/1998   prostate fossa - 6040 cGy, 33 fx, Dr Danny Lawless  . Osteoarthrosis involving, or with mention of more than one site, but not specified as generalized, multiple sites 10/22/2010  . Other abnormal blood chemistry 04/29/1991  . Other and unspecified hyperlipidemia 01/02/2013  . Palpitations 04/28/2004  . Prostate cancer ([place]) 12/16/1994   gleason 7  . Reflux esophagitis 05/10/2008  . Routine general medical examination at a health care facility   . Spinal stenosis, unspecified region other than  cervical 04/29/1995  . Type II or unspecified type diabetes mellitus without mention of complication, uncontrolled     Patient Active Problem List   Diagnosis Date Noted  . Class 2 severe obesity due to excess calories with serious comorbidity and body mass index (BMI) of 37.0 to 37.9 in adult ([place]) 11/11/2017  . Hollenhorst plaque 02/10/2016  . Congestive dilated cardiomyopathy ([place]) 02/10/2016  . Bilateral edema of lower extremity 01/14/2015  . Diabetes mellitus with renal manifestations, controlled ([place]) 09/10/2014  . DM type 2, uncontrolled, with renal complications ([place]) 83/41/9622  . DM type 2 with diabetic peripheral neuropathy ([place]) 05/29/2014  . Bone pain 05/29/2014  . Secondary malignant neoplasm of bone and bone marrow ([place]) 03/28/2014  . Malignant neoplasm of lower limb ([place]) 03/27/2014  . Pain of left leg 03/12/2014  . Left leg swelling 03/04/2014  . Malignant neoplasm of prostate ([place]) 03/04/2014  . Emphysema lung ([place]) 02/05/2014  . Dizziness 01/23/2014  . Dyspnea 01/23/2014  . Orthostatic hypotension 12/18/2013  . Diabetes mellitus type 2 in obese ([place]) 11/06/2013  . Bradycardia 11/06/2013  . Vitamin D deficiency 07/18/2013  . Uncontrolled hypertension 07/18/2013  . Hyperlipidemia 07/03/2013  . Essential hypertension, benign 07/03/2013  . Prostate cancer ([place]) 07/03/2013  . Osteoarthritis 07/03/2013  . Skin lesion 07/03/2013  . Prediabetes 07/03/2013  . Disorder of bone and cartilage 07/03/2013  . Hypokalemia 12/11/2012  . Hyponatremia 12/11/2012  .  UTI (lower urinary tract infection) 12/10/2012  . Sepsis ([place]) 12/10/2012  . Pyelonephritis 12/10/2012    Past Surgical History:  Procedure Laterality Date  . AIR/FLUID EXCHANGE Left 01/09/2016   Procedure: AIR/FLUID EXCHANGE;  Surgeon: Jalene Mullet, MD;  Location: [facility];  Service: Ophthalmology;  Laterality: Left;  . CYSTOSCOPY W/ URETERAL STENT PLACEMENT  12/31/14  . CYSTOSCOPY W/ URETERAL STENT PLACEMENT   05/16/2015  . EYE SURGERY  2006   cataract, Dr Shanon Rosser  . LASER PHOTO ABLATION Left 01/09/2016   Procedure: LASER PHOTO ABLATION;  Surgeon: Jalene Mullet, MD;  Location: [facility];  Service: Ophthalmology;  Laterality: Left;  Endolaser  . Left ureteral stent placement  Week of 12/05/11  . PARS PLANA VITRECTOMY Left 01/09/2016   Procedure: PARS PLANA VITRECTOMY WITH 25 GAUGE LEFT EYE ;  Surgeon: Jalene Mullet, MD;  Location: [facility];  Service: Ophthalmology;  Laterality: Left;  . PERFLUORONE INJECTION Left 01/09/2016   Procedure: PERFLUORONE INJECTION;  Surgeon: Jalene Mullet, MD;  Location: [facility];  Service: Ophthalmology;  Laterality: Left;  . PROSTATECTOMY  1996   Dr. Rosana Hoes        Home Medications    Prior to Admission medications   Medication Sig Start Date End Date Taking? Authorizing Provider  acetaminophen (TYLENOL) 650 MG CR tablet Take 650 mg by mouth as needed for pain.   Yes [provider]  aspirin 81 MG tablet Take 1 tablet (81 mg total) by mouth daily. 01/14/15  Yes Blanchie Serve, MD  Cholecalciferol 1000 UNITS tablet Take 1,000 Units by mouth daily.   Yes [provider]  doxazosin (CARDURA) 4 MG tablet Take 1 tablet (4 mg total) by mouth daily. 01/26/18  Yes Eulas Post, Brayton Layman, DO  doxazosin (CARDURA) 4 MG tablet TAKE 1 TABLET BY MOUTH ONCE DAILY 08/28/18  Yes [name], DO  enzalutamide Gillermina Phy) 40 MG capsule Take 120 mg by mouth daily.    Yes [provider]  leuprolide, 6 Month, (LEUPROLIDE ACETATE, 6 MONTH,) 45 MG injection Inject 45 mg into the skin every 6 (six) months. Filled by [facility] 08/20/15  Yes [name], DO  lisinopril-hydrochlorothiazide (PRINZIDE,ZESTORETIC) 20-12.5 MG tablet TAKE 2 TABLETS BY MOUTH ONCE DAILY IN THE MORNING AND 1 TABLET IN THE EVENING FOR BLOOD PRESSURE Patient taking differently: Take 1 tablet by mouth 2 (two) times daily.  03/06/18  Yes Eulas Post, Monica, DO  metFORMIN (GLUCOPHAGE) 500 MG tablet TAKE 1 TABLET BY  MOUTH ONCE DAILY WITH  BREAKFAST  TO  CONTROL  BLOOD  SUGAR. 05/30/18  Yes Eulas Post, Monica, DO  metoprolol tartrate (LOPRESSOR) 50 MG tablet Take 1 tablet (50 mg total) by mouth daily. 10/31/18  Yes Lauree Chandler, NP  Omega-3 Fatty Acids (FISH OIL) 1000 MG CAPS Take 1 capsule by mouth daily.   Yes [provider]  pyridoxine (B-6) 100 MG tablet Take 100 mg by mouth daily.   Yes [provider]  simvastatin (ZOCOR) 5 MG tablet TAKE 1 TABLET BY MOUTH ONCE DAILY Patient taking differently: Take 5 mg by mouth daily.  08/28/18  Yes Eulas Post, Monica, DO  traMADol (ULTRAM) 50 MG tablet Take 50 mg by mouth as needed for moderate pain.  02/24/16  Yes [provider]  glucose blood (ONETOUCH VERIO) test strip Check blood sugar one to two times a week. Dx:E11.29 11/03/18   Lauree Chandler, NP    Family History Family History  Problem Relation Age of Onset  . Heart disease Father   .  Stroke Sister     Social History Social History   Tobacco Use  . Smoking status: Former Smoker    Years: 5.00    Types: Cigars    Last attempt to quit: 12/16/1968    Years since quitting: 49.9  . Smokeless tobacco: Never Used  Substance Use Topics  . Alcohol use: No    Alcohol/week: 0.0 standard drinks  . Drug use: No     Allergies   Abiraterone   Review of Systems Review of Systems  Skin: Positive for rash.  Neurological: Positive for headaches.  All other systems reviewed and are negative.    Physical Exam Updated Vital Signs BP (!) 174/81   Pulse (!) 44   Temp 98.1 F (36.7 C) (Oral)   Resp 16   Ht 5\' 11"  (1.803 m)   Wt 121.1 kg   SpO2 95%   BMI 37.24 kg/m   Physical Exam Vitals signs and nursing note reviewed.  Constitutional:      Appearance: He is well-developed.  HENT:     Head: Normocephalic.     Mouth/Throat:     Mouth: Mucous membranes are moist.  Eyes:     Extraocular Movements: Extraocular movements intact.     Comments: L conjunctiva  slightly erythematous.   Neck:     Musculoskeletal: Normal range of motion and neck supple.  Cardiovascular:     Rate and Rhythm: Normal rate and regular rhythm.  Pulmonary:     Effort: Pulmonary effort is normal.     Breath sounds: Normal breath sounds.  Abdominal:     General: Bowel sounds are normal.     Palpations: Abdomen is soft.  Musculoskeletal: Normal range of motion.  Skin:    General: Skin is warm.     Comments: Vesicular rash on l side of face, V1 distribution.   Neurological:     Mental Status: He is alert.     Cranial Nerves: No cranial nerve deficit or facial asymmetry.     Sensory: No sensory deficit.     Motor: No weakness.      ED Treatments / Results  Labs (all labs ordered are listed, but only abnormal results are displayed) Labs Reviewed  CBC WITH DIFFERENTIAL/PLATELET - Abnormal; Notable for the following components:      Result Value   Hemoglobin 12.3 (*)    HCT 36.8 (*)    All other components within normal limits  COMPREHENSIVE METABOLIC PANEL - Abnormal; Notable for the following components:   CO2 20 (*)    Glucose, Bld 161 (*)    Calcium 8.7 (*)    Total Protein 6.1 (*)    Albumin 3.1 (*)    All other components within normal limits  I-STAT TROPONIN, ED    EKG EKG Interpretation  Date/Time:  Wednesday November 08 2018 10:16:17 EST Ventricular Rate:  63 PR Interval:    QRS Duration: 95 QT Interval:  450 QTC Calculation: 461 R Axis:   -20 Text Interpretation:  Sinus rhythm Short PR interval Left ventricular hypertrophy Borderline T abnormalities, anterior leads No significant change since last tracing Confirmed by Wandra Arthurs [phone]) on 11/08/2018 10:27:28 AM   Radiology No results found.  Procedures Procedures (including critical care time)  Medications Ordered in ED Medications  tetracaine (PONTOCAINE) 0.5 % ophthalmic solution 1 drop (1 drop Right Eye Given 11/08/18 1018)  fluorescein ophthalmic strip 1 strip (1 strip Left  Eye Given 11/08/18 1018)  morphine 4 MG/ML injection 4 mg (  4 mg Intravenous Given 11/08/18 1008)  valACYclovir (VALTREX) tablet 1,000 mg (1,000 mg Oral Given 11/08/18 1008)  predniSONE (DELTASONE) tablet 60 mg (60 mg Oral Given 11/08/18 1007)  ondansetron (ZOFRAN) injection 4 mg (4 mg Intravenous Given 11/08/18 1018)     Initial Impression / Assessment and Plan / ED Course  I have reviewed the triage vital signs and the nursing notes.  Pertinent labs & imaging results that were available during my care of the patient were reviewed by me and considered in my medical decision making (see chart for details).    JERIS ROSER is a 84 y.o. male here with L eye pain, L sided headaches, rash. I think likely shingles on the left face. Will stain with fluorescein and look for pseudodendrites. He is hypertensive so will get labs and give pain meds, valtrex, steroids.   11:23 AM Fluorescein stain performed and no pseudodendrites visualized or corneal abrasion. Labs unremarkable. BP improved with pain meds. Will dc home with valtrex, prednisone, gabapentin, short course of pain meds. Told him to follow up with PCP. He can follow up with ophtho if eye becomes more red and painful.    Final Clinical Impressions(s) / ED Diagnoses   Final diagnoses:  None    ED Discharge Orders    None       Drenda Freeze, MD 11/08/18 1125

## 2018-11-08 NOTE — ED Triage Notes (Signed)
C/o left side of forehead pain with left eye pain started 2 days ago- with headache- slight rash noted on left side of head-

## 2018-11-10 ENCOUNTER — Ambulatory Visit (INDEPENDENT_AMBULATORY_CARE_PROVIDER_SITE_OTHER): Payer: Medicare Other | Admitting: Adult Health

## 2018-11-10 ENCOUNTER — Encounter: Payer: Self-pay | Admitting: Adult Health

## 2018-11-10 VITALS — BP 164/92 | HR 61 | Temp 97.6°F | Ht 71.0 in | Wt 258.2 lb

## 2018-11-10 DIAGNOSIS — B0223 Postherpetic polyneuropathy: Secondary | ICD-10-CM

## 2018-11-10 DIAGNOSIS — B029 Zoster without complications: Secondary | ICD-10-CM

## 2018-11-10 MED ORDER — GABAPENTIN 300 MG PO CAPS: 300.0000 mg | ORAL_CAPSULE | Freq: Three times a day (TID) | ORAL | 0 refills | Status: DC

## 2018-11-10 NOTE — Patient Instructions (Addendum)
Continue taking Valtrex 1,000 mg  3 times a day for a total of 7 days.   Will increase Neurontin 300 mg from BID to TID and continue prednisone   Will need to see his opthalmologist ASAP, per patient he will call to make the appointment.  Will need to follow-up in 1 week.

## 2018-11-10 NOTE — Progress Notes (Signed)
[facility] clinic  Provider: Analynn Daum C. Medina-Vargas - NP  Code Status:  Full Code  Goals of Care:  Advanced Directives 11/08/2018  Does Patient Have a Medical Advance Directive? No  Would patient like information on creating a medical advance directive? -     Chief Complaint  Patient presents with  . Acute Visit    Complains of Shingles around left eye. Went to [employer] on Christmas and given pain medication and told to follow up with PCP    HPI: Patient is an 84 y.o. male seen today for an acute visit for follow-up Herpes Zoster. He was seen in the ED on 11/08/18 for headache and and left eye pain. He was noted to have a rash. He was prescribed Valtrex, Gabapentin, Prednisone taper and Norco PRN. He was seen today with rashes on the bridge of his nose, left eye and the left forehead. He denies having change in his vision. He said that he is "a little bit wobbly when walking. He denies fever nor cough.   Past Medical History:  Diagnosis Date  . Acute bronchitis 09/12/2012  . 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 Bone metastases ([place])    left mid tibial shaft  . Cataract    Dr.Groat  . Cellulitis and abscess of leg, except foot 01/17/2012  . Chest pain, unspecified 04/28/2004  . Closed fracture of five ribs 03/24/2004  . First degree atrioventricular block 04/25/2007  . Gross hematuria 09/06/2011  . History of radiation therapy 04/03/14- 04/17/14   mid to distal left tibia 3000 cGy in 10 sessions  . HTN (hypertension)   . Hx of radiation therapy 11/1998   prostate fossa - 6040 cGy, 33 fx, Dr Danny Lawless  . Osteoarthrosis involving, or with mention of more than one site, but not specified as generalized, multiple sites 10/22/2010  . Other abnormal blood chemistry 04/29/1991  . Other and unspecified hyperlipidemia 01/02/2013  . Palpitations 04/28/2004  . Prostate cancer ([place]) 12/16/1994   gleason 7  . Reflux esophagitis 05/10/2008  . Routine general medical examination at a health  care facility   . Spinal stenosis, unspecified region other than cervical 04/29/1995  . Type II or unspecified type diabetes mellitus without mention of complication, uncontrolled     Past Surgical History:  Procedure Laterality Date  . AIR/FLUID EXCHANGE Left 01/09/2016   Procedure: AIR/FLUID EXCHANGE;  Surgeon: Jalene Mullet, MD;  Location: [place];  Service: Ophthalmology;  Laterality: Left;  . CYSTOSCOPY W/ URETERAL STENT PLACEMENT  12/31/14  . CYSTOSCOPY W/ URETERAL STENT PLACEMENT  05/16/2015  . EYE SURGERY  2006   cataract, Dr Shanon Rosser  . LASER PHOTO ABLATION Left 01/09/2016   Procedure: LASER PHOTO ABLATION;  Surgeon: Jalene Mullet, MD;  Location: [place];  Service: Ophthalmology;  Laterality: Left;  Endolaser  . Left ureteral stent placement  Week of 12/05/11  . PARS PLANA VITRECTOMY Left 01/09/2016   Procedure: PARS PLANA VITRECTOMY WITH 25 GAUGE LEFT EYE ;  Surgeon: Jalene Mullet, MD;  Location: [place];  Service: Ophthalmology;  Laterality: Left;  . PERFLUORONE INJECTION Left 01/09/2016   Procedure: PERFLUORONE INJECTION;  Surgeon: Jalene Mullet, MD;  Location: [place];  Service: Ophthalmology;  Laterality: Left;  . PROSTATECTOMY  1996   Dr. Rosana Hoes    Allergies  Allergen Reactions  . Abiraterone Nausea And Vomiting and Other (See Comments)    Other reaction(s): Increased Heart Rate (intolerance)     Outpatient Encounter Medications as of 11/10/2018  Medication Sig  . acetaminophen (  TYLENOL) 650 MG CR tablet Take 650 mg by mouth as needed for pain.  Marland Kitchen aspirin 81 MG tablet Take 1 tablet (81 mg total) by mouth daily.  . Cholecalciferol 1000 UNITS tablet Take 1,000 Units by mouth daily.  Marland Kitchen doxazosin (CARDURA) 4 MG tablet Take 1 tablet (4 mg total) by mouth daily.  . enzalutamide (XTANDI) 40 MG capsule Take 120 mg by mouth daily.   Marland Kitchen erythromycin ophthalmic ointment Place a 1/2 inch ribbon of ointment into the lower eyelid twice daily for a week  . gabapentin (NEURONTIN) 300 MG capsule  Take 1 capsule (300 mg total) by mouth 2 (two) times daily.  Marland Kitchen glucose blood (ONETOUCH VERIO) test strip Check blood sugar one to two times a week. Dx:E11.29  . HYDROcodone-acetaminophen (NORCO/VICODIN) 5-325 MG tablet Take 1 tablet by mouth every 6 (six) hours as needed.  Marland Kitchen leuprolide, 6 Month, (LEUPROLIDE ACETATE, 6 MONTH,) 45 MG injection Inject 45 mg into the skin every 6 (six) months. Filled by [facility]  . lisinopril-hydrochlorothiazide (PRINZIDE,ZESTORETIC) 20-12.5 MG tablet TAKE 2 TABLETS BY MOUTH ONCE DAILY IN THE MORNING AND 1 TABLET IN THE EVENING FOR BLOOD PRESSURE (Patient taking differently: Take 1 tablet by mouth 2 (two) times daily. )  . metFORMIN (GLUCOPHAGE) 500 MG tablet TAKE 1 TABLET BY MOUTH ONCE DAILY WITH  BREAKFAST  TO  CONTROL  BLOOD  SUGAR.  . metoprolol tartrate (LOPRESSOR) 50 MG tablet Take 1 tablet (50 mg total) by mouth daily.  . Omega-3 Fatty Acids (FISH OIL) 1000 MG CAPS Take 1 capsule by mouth daily.  . predniSONE (DELTASONE) 20 MG tablet Take 60 mg daily x 2 days then 40 mg daily x 2 days then 20 mg daily x 2 days  . pyridoxine (B-6) 100 MG tablet Take 100 mg by mouth daily.  . simvastatin (ZOCOR) 5 MG tablet TAKE 1 TABLET BY MOUTH ONCE DAILY (Patient taking differently: Take 5 mg by mouth daily. )  . traMADol (ULTRAM) 50 MG tablet Take 50 mg by mouth as needed for moderate pain.   . valACYclovir (VALTREX) 1000 MG tablet Take 1 tablet (1,000 mg total) by mouth 3 (three) times daily for 7 days.  . [DISCONTINUED] doxazosin (CARDURA) 4 MG tablet TAKE 1 TABLET BY MOUTH ONCE DAILY   No facility-administered encounter medications on file as of 11/10/2018.     Review of Systems:  Review of Systems  Constitutional: Negative for appetite change and fever.  HENT: Negative for congestion.   Eyes: Positive for pain and redness.  Respiratory: Negative for cough and shortness of breath.   Cardiovascular: Negative for leg swelling.  Gastrointestinal: Negative for  abdominal pain, constipation and nausea.  Musculoskeletal: Positive for gait problem. Negative for arthralgias and joint swelling.  Skin: Positive for rash.    Health Maintenance  Topic Date Due  . OPHTHALMOLOGY EXAM  07/16/2018  . HEMOGLOBIN A1C  01/17/2019  . FOOT EXAM  04/15/2019  . TETANUS/TDAP  11/08/2023  . INFLUENZA VACCINE  Completed  . PNA vac Low Risk Adult  Completed    Physical Exam: Vitals:   11/10/18 1306  BP: (!) 164/92  Pulse: 61  Temp: 97.6 F (36.4 C)  TempSrc: Oral  SpO2: 97%  Weight: 258 lb 3.2 oz (117.1 kg)  Height: 5\' 11"  (1.803 m)   Body mass index is 36.01 kg/m. Physical Exam Constitutional:      Appearance: He is obese.  HENT:     Head: Normocephalic.     Mouth/Throat:  Mouth: Mucous membranes are moist.     Pharynx: Oropharynx is clear.  Eyes:     General:        Left eye: No discharge.     Comments: +Left upper eyelid swollen with rashes  Neck:     Musculoskeletal: Normal range of motion.  Cardiovascular:     Rate and Rhythm: Normal rate and regular rhythm.     Heart sounds: Normal heart sounds.  Pulmonary:     Effort: Pulmonary effort is normal.     Breath sounds: Normal breath sounds.  Abdominal:     General: Bowel sounds are normal.     Palpations: Abdomen is soft.  Skin:    Findings: Rash (on nose left eye and left forehead) present.  Neurological:     Mental Status: He is alert.     Labs reviewed: Basic Metabolic Panel: Recent Labs    04/12/18 0827 07/19/18 0818 11/08/18 1007  NA 141 140 136  K 4.3 4.2 3.7  CL 109 107 106  CO2 25 26 20*  GLUCOSE 121* 119* 161*  BUN 27* 25 17  CREATININE 1.31* 1.20* 1.05  CALCIUM 8.6 8.9 8.7*  TSH 1.80  --   --    Liver Function Tests: Recent Labs    04/12/18 0827 07/19/18 0818 11/08/18 1007  AST 10 13 17   ALT 9 9 12   ALKPHOS  --   --  70  BILITOT 0.4 0.4 0.5  PROT 5.7* 6.1 6.1*  ALBUMIN  --   --  3.1*   CBC: Recent Labs    04/12/18 0827 11/08/18 1007  WBC  6.9 8.2  NEUTROABS 4,244 6.2  HGB 11.9* 12.3*  HCT 34.1* 36.8*  MCV 83.0 85.0  PLT 192 195   Lipid Panel: Recent Labs    04/12/18 0827 07/19/18 0818  CHOL 179 162  HDL 42 41  LDLCALC 117* 104*  TRIG 94 78  CHOLHDL 4.3 4.0   Lab Results  Component Value Date   HGBA1C 5.8 (H) 07/19/2018     Assessment/Plan . 1. Herpes zoster without complication -Continue taking Valtrex 1,000 mg  3 times a day for a total of 7 days  -Will need to see his opthalmologist ASAP, per patient he will call to make the appointment.  - Will need to follow-up in 1 week.   2. Shingles (herpes zoster) polyneuropathy Will increase Neurontin 300 mg from BID to TID and continue prednisone - gabapentin (NEURONTIN) 300 MG capsule; Take 1 capsule (300 mg total) by mouth 3 (three) times daily for 7 days.  Dispense: 21 capsule; Refill: 0    Next appt:  11/21/2018    Raychelle Hudman C. Medina-Vargas - NP

## 2018-11-13 DIAGNOSIS — B0239 Other herpes zoster eye disease: Secondary | ICD-10-CM | POA: Diagnosis not present

## 2018-11-13 DIAGNOSIS — B0231 Zoster conjunctivitis: Secondary | ICD-10-CM | POA: Diagnosis not present

## 2018-11-16 ENCOUNTER — Other Ambulatory Visit: Payer: Self-pay

## 2018-11-16 DIAGNOSIS — E782 Mixed hyperlipidemia: Secondary | ICD-10-CM

## 2018-11-16 DIAGNOSIS — E1165 Type 2 diabetes mellitus with hyperglycemia: Secondary | ICD-10-CM

## 2018-11-16 DIAGNOSIS — I10 Essential (primary) hypertension: Secondary | ICD-10-CM

## 2018-11-16 DIAGNOSIS — E871 Hypo-osmolality and hyponatremia: Secondary | ICD-10-CM

## 2018-11-16 DIAGNOSIS — E1129 Type 2 diabetes mellitus with other diabetic kidney complication: Secondary | ICD-10-CM

## 2018-11-16 DIAGNOSIS — IMO0002 Reserved for concepts with insufficient information to code with codable children: Secondary | ICD-10-CM

## 2018-11-16 DIAGNOSIS — E0821 Diabetes mellitus due to underlying condition with diabetic nephropathy: Secondary | ICD-10-CM

## 2018-11-17 ENCOUNTER — Encounter: Payer: Self-pay | Admitting: Nurse Practitioner

## 2018-11-17 ENCOUNTER — Ambulatory Visit (INDEPENDENT_AMBULATORY_CARE_PROVIDER_SITE_OTHER): Payer: Medicare Other | Admitting: Nurse Practitioner

## 2018-11-17 VITALS — BP 140/84 | HR 70 | Temp 97.4°F | Ht 71.0 in | Wt 250.0 lb

## 2018-11-17 DIAGNOSIS — B029 Zoster without complications: Secondary | ICD-10-CM | POA: Diagnosis not present

## 2018-11-17 DIAGNOSIS — B0223 Postherpetic polyneuropathy: Secondary | ICD-10-CM | POA: Diagnosis not present

## 2018-11-17 MED ORDER — PREGABALIN 75 MG PO CAPS: 75.0000 mg | ORAL_CAPSULE | Freq: Two times a day (BID) | ORAL | 0 refills | Status: DC

## 2018-11-17 NOTE — Patient Instructions (Addendum)
STOP GABAPENTIN  Start lyrica 75 mg by mouth twice daily for neuropathy (pain related to shingles)  Follow up in 7-10 days on shingles pain and back pain

## 2018-11-17 NOTE — Progress Notes (Signed)
Careteam: Patient Care Team: Lauree Chandler, NP as PCP - General (Geriatric Medicine) Myrlene Broker, MD as Attending Physician (Urology) [name], MD as Consulting Physician (Ophthalmology) [name], MD as Consulting Physician (Ophthalmology)  Advanced Directive information    Allergies  Allergen Reactions  . Abiraterone Nausea And Vomiting and Other (See Comments)    Other reaction(s): Increased Heart Rate (intolerance)     Chief Complaint  Patient presents with  . Follow-up    Follow up on shingles, still in pain     HPI: Patient is a 85 y.o. male seen in the office today to follow up shingles.  He was diagnosised with shingles in the ED on 11/08/18. He was prescribed valtrex, prednisone, gabapentin, short course of pain meds and followed up in our office on 11/10/18 with [facility] NP. She increased gabapentin from 300 mg BID to TID and instructed him to complete prednisone and valtrex.   Did make appt with ophthalmologist and he changed eye ointment to a liquid and plans to follow up with him.   Still having ongoing pain. Pain is a 4/10 now but will having shooting pain throughout the day which is a 10/10, becomes less the longer he is up.   Review of Systems:  Review of Systems  Constitutional: Negative for chills and fever.  Musculoskeletal: Positive for back pain.  Skin: Positive for itching and rash.  Neurological: Positive for tingling.    Past Medical History:  Diagnosis Date  . Acute bronchitis 09/12/2012  . 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 Bone metastases ([place])    left mid tibial shaft  . Cataract    Dr.Groat  . Cellulitis and abscess of leg, except foot 01/17/2012  . Chest pain, unspecified 04/28/2004  . Closed fracture of five ribs 03/24/2004  . First degree atrioventricular block 04/25/2007  . Gross hematuria 09/06/2011  . History of radiation therapy 04/03/14- 04/17/14   mid to distal left tibia 3000 cGy in 10 sessions  . HTN  (hypertension)   . Hx of radiation therapy 11/1998   prostate fossa - 6040 cGy, 33 fx, Dr Danny Lawless  . Osteoarthrosis involving, or with mention of more than one site, but not specified as generalized, multiple sites 10/22/2010  . Other abnormal blood chemistry 04/29/1991  . Other and unspecified hyperlipidemia 01/02/2013  . Palpitations 04/28/2004  . Prostate cancer ([place]) 12/16/1994   gleason 7  . Reflux esophagitis 05/10/2008  . Routine general medical examination at a health care facility   . Spinal stenosis, unspecified region other than cervical 04/29/1995  . Type II or unspecified type diabetes mellitus without mention of complication, uncontrolled    Past Surgical History:  Procedure Laterality Date  . AIR/FLUID EXCHANGE Left 01/09/2016   Procedure: AIR/FLUID EXCHANGE;  Surgeon: Jalene Mullet, MD;  Location: [place];  Service: Ophthalmology;  Laterality: Left;  . CYSTOSCOPY W/ URETERAL STENT PLACEMENT  12/31/14  . CYSTOSCOPY W/ URETERAL STENT PLACEMENT  05/16/2015  . EYE SURGERY  2006   cataract, Dr Shanon Rosser  . LASER PHOTO ABLATION Left 01/09/2016   Procedure: LASER PHOTO ABLATION;  Surgeon: Jalene Mullet, MD;  Location: [place];  Service: Ophthalmology;  Laterality: Left;  Endolaser  . Left ureteral stent placement  Week of 12/05/11  . PARS PLANA VITRECTOMY Left 01/09/2016   Procedure: PARS PLANA VITRECTOMY WITH 25 GAUGE LEFT EYE ;  Surgeon: Jalene Mullet, MD;  Location: [place];  Service: Ophthalmology;  Laterality: Left;  . PERFLUORONE INJECTION Left 01/09/2016  Procedure: PERFLUORONE INJECTION;  Surgeon: Jalene Mullet, MD;  Location: [place];  Service: Ophthalmology;  Laterality: Left;  . PROSTATECTOMY  1996   Dr. Rosana Hoes   Social History:   reports that he quit smoking about 49 years ago. His smoking use included cigars. He quit after 5.00 years of use. He has never used smokeless tobacco. He reports that he does not drink alcohol or use drugs.  Family History  Problem Relation Age of  Onset  . Heart disease Father   . Stroke Sister     Medications: Patient's Medications  New Prescriptions   No medications on file  Previous Medications   ACETAMINOPHEN (TYLENOL) 650 MG CR TABLET    Take 650 mg by mouth as needed for pain.   ASPIRIN 81 MG TABLET    Take 1 tablet (81 mg total) by mouth daily.   CHOLECALCIFEROL 1000 UNITS TABLET    Take 1,000 Units by mouth daily.   DOXAZOSIN (CARDURA) 4 MG TABLET    Take 1 tablet (4 mg total) by mouth daily.   ENZALUTAMIDE (XTANDI) 40 MG CAPSULE    Take 120 mg by mouth daily.    GLUCOSE BLOOD (ONETOUCH VERIO) TEST STRIP    Check blood sugar one to two times a week. Dx:E11.29   LEUPROLIDE, 6 MONTH, (LEUPROLIDE ACETATE, 6 MONTH,) 45 MG INJECTION    Inject 45 mg into the skin every 6 (six) months. Filled by [facility] (PRINZIDE,ZESTORETIC) 20-12.5 MG TABLET    TAKE 2 TABLETS BY MOUTH ONCE DAILY IN THE MORNING AND 1 TABLET IN THE EVENING FOR BLOOD PRESSURE   METFORMIN (GLUCOPHAGE) 500 MG TABLET    TAKE 1 TABLET BY MOUTH ONCE DAILY WITH  BREAKFAST  TO  CONTROL  BLOOD  SUGAR.   METOPROLOL TARTRATE (LOPRESSOR) 50 MG TABLET    Take 1 tablet (50 mg total) by mouth daily.   OMEGA-3 FATTY ACIDS (FISH OIL) 1000 MG CAPS    Take 1 capsule by mouth daily.   PYRIDOXINE (B-6) 100 MG TABLET    Take 100 mg by mouth daily.   SIMVASTATIN (ZOCOR) 5 MG TABLET    TAKE 1 TABLET BY MOUTH ONCE DAILY   TRAMADOL (ULTRAM) 50 MG TABLET    Take 50 mg by mouth as needed for moderate pain.   Modified Medications   No medications on file  Discontinued Medications   ERYTHROMYCIN OPHTHALMIC OINTMENT    Place a 1/2 inch ribbon of ointment into the lower eyelid twice daily for a week   GABAPENTIN (NEURONTIN) 300 MG CAPSULE    Take 1 capsule (300 mg total) by mouth 3 (three) times daily for 7 days.   HYDROCODONE-ACETAMINOPHEN (NORCO/VICODIN) 5-325 MG TABLET    Take 1 tablet by mouth every 6 (six) hours as needed.   PREDNISONE (DELTASONE) 20  MG TABLET    Take 60 mg daily x 2 days then 40 mg daily x 2 days then 20 mg daily x 2 days     Physical Exam:  Vitals:   11/17/18 1429  BP: 140/84  Pulse: 70  Temp: (!) 97.4 F (36.3 C)  TempSrc: Oral  SpO2: 97%  Weight: 250 lb (113.4 kg)  Height: 5\' 11"  (1.803 m)   Body mass index is 34.87 kg/m.  Physical Exam Constitutional:      Appearance: Normal appearance. He is obese.  HENT:     Head: Normocephalic.     Mouth/Throat:     Mouth: Mucous membranes are  moist.     Pharynx: Oropharynx is clear.  Eyes:     General:        Left eye: No discharge.     Comments: +Left upper eyelid minimally swollen  Neck:     Musculoskeletal: Normal range of motion.  Cardiovascular:     Rate and Rhythm: Normal rate and regular rhythm.     Heart sounds: Normal heart sounds.  Pulmonary:     Effort: Pulmonary effort is normal.     Breath sounds: Normal breath sounds.  Abdominal:     General: Bowel sounds are normal.     Palpations: Abdomen is soft.  Skin:    Findings: Rash (on nose left eye and left forehead-crushed over, without signs of infection) present.  Neurological:     Mental Status: He is alert.     Labs reviewed: Basic Metabolic Panel: Recent Labs    04/12/18 0827 07/19/18 0818 11/08/18 1007  NA 141 140 136  K 4.3 4.2 3.7  CL 109 107 106  CO2 25 26 20*  GLUCOSE 121* 119* 161*  BUN 27* 25 17  CREATININE 1.31* 1.20* 1.05  CALCIUM 8.6 8.9 8.7*  TSH 1.80  --   --    Liver Function Tests: Recent Labs    04/12/18 0827 07/19/18 0818 11/08/18 1007  AST 10 13 17   ALT 9 9 12   ALKPHOS  --   --  70  BILITOT 0.4 0.4 0.5  PROT 5.7* 6.1 6.1*  ALBUMIN  --   --  3.1*   No results for input(s): LIPASE, AMYLASE in the last 8760 hours. No results for input(s): AMMONIA in the last 8760 hours. CBC: Recent Labs    04/12/18 0827 11/08/18 1007  WBC 6.9 8.2  NEUTROABS 4,244 6.2  HGB 11.9* 12.3*  HCT 34.1* 36.8*  MCV 83.0 85.0  PLT 192 195   Lipid Panel: Recent  Labs    04/12/18 0827 07/19/18 0818  CHOL 179 162  HDL 42 41  LDLCALC 117* 104*  TRIG 94 78  CHOLHDL 4.3 4.0   TSH: Recent Labs    04/12/18 0827  TSH 1.80   A1C: Lab Results  Component Value Date   HGBA1C 5.8 (H) 07/19/2018     Assessment/Plan 1. Herpes zoster without complication Lesions are healing without signs of infection. Has followed up with ophthalmologist and on drops from their office, he is unsure what they have placed him on.  Completed prednisone and valtrex continues to have post herpetic neuralgia.    2. Shingles (herpes zoster) polyneuropathy -uncontrolled with gabapentin. To stop at this time and start lyrica 75 mg by mouth twice daily  - pregabalin (LYRICA) 75 MG capsule; Take 1 capsule (75 mg total) by mouth 2 (two) times daily.  Dispense: 60 capsule; Refill: 0  Follow up in 1 week as scheduled Keayra Graham K. Letts, [facility] [phone]

## 2018-11-20 DIAGNOSIS — B0231 Zoster conjunctivitis: Secondary | ICD-10-CM | POA: Diagnosis not present

## 2018-11-20 DIAGNOSIS — B0239 Other herpes zoster eye disease: Secondary | ICD-10-CM | POA: Diagnosis not present

## 2018-11-21 ENCOUNTER — Other Ambulatory Visit: Payer: Medicare Other

## 2018-11-21 DIAGNOSIS — E0821 Diabetes mellitus due to underlying condition with diabetic nephropathy: Secondary | ICD-10-CM

## 2018-11-21 DIAGNOSIS — E1129 Type 2 diabetes mellitus with other diabetic kidney complication: Secondary | ICD-10-CM | POA: Diagnosis not present

## 2018-11-21 DIAGNOSIS — E1165 Type 2 diabetes mellitus with hyperglycemia: Secondary | ICD-10-CM

## 2018-11-21 DIAGNOSIS — I10 Essential (primary) hypertension: Secondary | ICD-10-CM | POA: Diagnosis not present

## 2018-11-21 DIAGNOSIS — E782 Mixed hyperlipidemia: Secondary | ICD-10-CM | POA: Diagnosis not present

## 2018-11-21 DIAGNOSIS — E871 Hypo-osmolality and hyponatremia: Secondary | ICD-10-CM

## 2018-11-21 DIAGNOSIS — IMO0002 Reserved for concepts with insufficient information to code with codable children: Secondary | ICD-10-CM

## 2018-11-22 LAB — COMPLETE METABOLIC PANEL WITH GFR
AG Ratio: 1.6 (calc) (ref 1.0–2.5)
ALT: 8 U/L — ABNORMAL LOW (ref 9–46)
AST: 11 U/L (ref 10–35)
Albumin: 3.6 g/dL (ref 3.6–5.1)
Alkaline phosphatase (APISO): 98 U/L (ref 40–115)
BUN/Creatinine Ratio: 21 (calc) (ref 6–22)
BUN: 25 mg/dL (ref 7–25)
CALCIUM: 8.8 mg/dL (ref 8.6–10.3)
CO2: 26 mmol/L (ref 20–32)
Chloride: 105 mmol/L (ref 98–110)
Creat: 1.21 mg/dL — ABNORMAL HIGH (ref 0.70–1.11)
GFR, Est African American: 64 mL/min/{1.73_m2} (ref >=60)
GFR, Est Non African American: 55 mL/min/{1.73_m2} — ABNORMAL LOW (ref >=60)
Globulin: 2.3 g/dL (calc) (ref 1.9–3.7)
Glucose, Bld: 126 mg/dL — ABNORMAL HIGH (ref 65–99)
Potassium: 4.5 mmol/L (ref 3.5–5.3)
Sodium: 140 mmol/L (ref 135–146)
TOTAL PROTEIN: 5.9 g/dL — AB (ref 6.1–8.1)
Total Bilirubin: 0.5 mg/dL (ref 0.2–1.2)

## 2018-11-22 LAB — CBC WITH DIFFERENTIAL/PLATELET
Absolute Monocytes: 512 cells/uL (ref 200–950)
Basophils Absolute: 47 cells/uL (ref 0–200)
Basophils Relative: 0.5 %
Eosinophils Absolute: 130 cells/uL (ref 15–500)
Eosinophils Relative: 1.4 %
HCT: 37.2 % — ABNORMAL LOW (ref 38.5–50.0)
Hemoglobin: 12.7 g/dL — ABNORMAL LOW (ref 13.2–17.1)
Lymphs Abs: 2799 cells/uL (ref 850–3900)
MCH: 28.7 pg (ref 27.0–33.0)
MCHC: 34.1 g/dL (ref 32.0–36.0)
MCV: 84.2 fL (ref 80.0–100.0)
MPV: 10.1 fL (ref 7.5–12.5)
Monocytes Relative: 5.5 %
NEUTROS PCT: 62.5 %
Neutro Abs: 5813 cells/uL (ref 1500–7800)
Platelets: 210 10*3/uL (ref 140–400)
RBC: 4.42 10*6/uL (ref 4.20–5.80)
RDW: 15.1 % — ABNORMAL HIGH (ref 11.0–15.0)
Total Lymphocyte: 30.1 %
WBC: 9.3 10*3/uL (ref 3.8–10.8)

## 2018-11-22 LAB — FERRITIN: Ferritin: 335 ng/mL (ref 24–380)

## 2018-11-22 LAB — MICROALBUMIN / CREATININE URINE RATIO
CREATININE, URINE: 217 mg/dL (ref 20–320)
MICROALB UR: 59.5 mg/dL
Microalb Creat Ratio: 274 mcg/mg creat — ABNORMAL HIGH (ref <30)

## 2018-11-22 LAB — LIPID PANEL
Cholesterol: 186 mg/dL (ref <200)
HDL: 43 mg/dL (ref >40)
LDL Cholesterol (Calc): 119 mg/dL (calc) — ABNORMAL HIGH
Non-HDL Cholesterol (Calc): 143 mg/dL (calc) — ABNORMAL HIGH (ref <130)
Total CHOL/HDL Ratio: 4.3 (calc) (ref <5.0)
Triglycerides: 125 mg/dL (ref <150)

## 2018-11-22 LAB — HEMOGLOBIN A1C
Hgb A1c MFr Bld: 6 % of total Hgb — ABNORMAL HIGH (ref <5.7)
Mean Plasma Glucose: 126 (calc)
eAG (mmol/L): 7 (calc)

## 2018-11-24 ENCOUNTER — Encounter: Payer: Self-pay | Admitting: Nurse Practitioner

## 2018-11-24 ENCOUNTER — Ambulatory Visit (INDEPENDENT_AMBULATORY_CARE_PROVIDER_SITE_OTHER): Payer: Medicare Other | Admitting: Nurse Practitioner

## 2018-11-24 VITALS — BP 138/68 | HR 72 | Temp 97.5°F | Ht 71.0 in | Wt 261.8 lb

## 2018-11-24 DIAGNOSIS — E782 Mixed hyperlipidemia: Secondary | ICD-10-CM

## 2018-11-24 DIAGNOSIS — E0821 Diabetes mellitus due to underlying condition with diabetic nephropathy: Secondary | ICD-10-CM

## 2018-11-24 DIAGNOSIS — M150 Primary generalized (osteo)arthritis: Secondary | ICD-10-CM

## 2018-11-24 DIAGNOSIS — I10 Essential (primary) hypertension: Secondary | ICD-10-CM

## 2018-11-24 DIAGNOSIS — M159 Polyosteoarthritis, unspecified: Secondary | ICD-10-CM

## 2018-11-24 DIAGNOSIS — I059 Rheumatic mitral valve disease, unspecified: Secondary | ICD-10-CM | POA: Diagnosis not present

## 2018-11-24 DIAGNOSIS — C61 Malignant neoplasm of prostate: Secondary | ICD-10-CM | POA: Diagnosis not present

## 2018-11-24 DIAGNOSIS — B0223 Postherpetic polyneuropathy: Secondary | ICD-10-CM | POA: Diagnosis not present

## 2018-11-24 DIAGNOSIS — I3481 Nonrheumatic mitral (valve) annulus calcification: Secondary | ICD-10-CM

## 2018-11-24 DIAGNOSIS — B029 Zoster without complications: Secondary | ICD-10-CM | POA: Diagnosis not present

## 2018-11-24 DIAGNOSIS — R600 Localized edema: Secondary | ICD-10-CM | POA: Diagnosis not present

## 2018-11-24 MED ORDER — SIMVASTATIN 10 MG PO TABS: 10.0000 mg | ORAL_TABLET | Freq: Every day | ORAL | 1 refills | Status: DC

## 2018-11-24 NOTE — Progress Notes (Signed)
Careteam: Patient Care Team: Lauree Chandler, NP as PCP - General (Geriatric Medicine) Myrlene Broker, MD as Attending Physician (Urology) [name], MD as Consulting Physician (Ophthalmology) [name], MD as Consulting Physician (Ophthalmology)  Advanced Directive information Does Patient Have a Medical Advance Directive?: No, Would patient like information on creating a medical advance directive?: No - Patient declined  Allergies  Allergen Reactions  . Abiraterone Nausea And Vomiting and Other (See Comments)    Other reaction(s): Increased Heart Rate (intolerance)     Chief Complaint  Patient presents with  . Medical Management of Chronic Issues    4 month follow up, discuss labs  . Immunizations    Patient can not get shingrix vaccine currently has shingles   . Health Maintenance    Will call to request exam from Eye Doctor     HPI: Patient is a 85 y.o. male seen in the office today for 4 month follow up. Pt is a poor historian hx provided from chart.   Obesity - He does not follow a specific diet and has no routine exercise. He reports poor exercise tolerance due to multiple joint pain.   Shingles- completed valtrex and prednisone. Currently seeing ophthalmologist and continues eye ointment due to preventative, there was not ocular involvement. Continues with pain. Trailed lyrica but reports gabapentin has worked better.    OD Hollenhorst plaque - unchanged. he had sx x 2 as the 1st procedure was ineffective. Vision improved after 2nd sx. He is followed by Dr Judd Lien (ophthamology).    mitral annular calcification - no changes; Oct 2018 2D echo revealed mild AS and moderate focal basal hypertrophy of septum with mild concentric hypertrophy and nml EF (60-65%); grade 1 DD. He has SOB. No CP or dizziness. No new leg swelling. Followed by cardio Dr Leonia Reeves NP. He has mitral annular calcification but no sx recommended. Cardio had recommended  increase in statin tx but pt declined. LDL 104. Carotid duplex revealed 1-39% stenosis b/l ICA in 2018, did not follow up for his yearly appt in October.    hx prostate CA and rec'd XRT for bone mets (left tibia and pelvis) - He is currently on injection q51mos. He also takes cardura. PSA stable on Xtandi. He gets surveillance CT chest/abd/pelvis and whole body bone scan at [facility]. No changes compared to previous studies. Followed by urology Dr Rosana Hoes and oncology Dr Marcello Moores. LDH 121; PSA <0.01. He had ureteral stent change in May 2019 with Dr Rosana Hoes and stent changed q43mos. He takes prn tramadol when stent changed. He has difficulty starting urine stream and some times needs to self cath. Albumin 3.6; Hgb 11.9   DM - BS 120s usually. He checks once weekly. No low BS reactions. He takes metformin. A1c 6.0%. Eye exam is UTD. No diabetic changes -Followed by Dr Delman Cheadle. He has noticed numbness/tingling in feet since beginning [place]. LDL elevated 119, on low dose statin   HTN - controlled on lisinopril-HCT and metoprolol. Takes ASA daily   Hyperlipidemia - improved on low dose simvastatin. Occasional muscle weakness. LDL 119- up from previous, reports he could work on diet more.    Multiple joint pain - mostly in back. He takes prn tylenol when he needs it.    Hx anemia - stable. Hgb 12.7   CKD - stage 2. Cr 1.2. Stable     Labs reviewed with pt during [facility] Exam 09/05/2017 09/03/2016  09/10/2014  Orientation to time 5 5 5   Orientation to Place 5 5 5   Registration 3 3 3   Attention/ Calculation 4 5 5   Recall 1 3 3   Language- name 2 objects 2 2 2   Language- repeat 1 1 1   Language- follow 3 step command 3 3 3   Language- read & follow direction 1 1 1   Write a sentence 1 0 1  Copy design 1 1 1   Total score 27 29 30         Review of Systems:  Review of Systems  Constitutional: Negative for chills, fever and weight loss.  HENT: Negative for tinnitus.     Respiratory: Negative for cough, sputum production and shortness of breath.   Cardiovascular: Positive for leg swelling (stable). Negative for chest pain and palpitations.  Gastrointestinal: Negative for abdominal pain, constipation, diarrhea and heartburn.  Genitourinary: Positive for frequency. Negative for dysuria and urgency.  Musculoskeletal: Positive for back pain (occasionally). Negative for falls, joint pain and myalgias.  Skin: Negative.   Neurological: Positive for tingling (with pain due to shingles over left eye). Negative for dizziness and headaches.  Psychiatric/Behavioral: Positive for memory loss. Negative for depression. The patient does not have insomnia.     Past Medical History:  Diagnosis Date  . Acute bronchitis 09/12/2012  . 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 Bone metastases ([facility])    left mid tibial shaft  . Cataract    Dr.Groat  . Cellulitis and abscess of leg, except foot 01/17/2012  . Chest pain, unspecified 04/28/2004  . Closed fracture of five ribs 03/24/2004  . First degree atrioventricular block 04/25/2007  . Gross hematuria 09/06/2011  . History of radiation therapy 04/03/14- 04/17/14   mid to distal left tibia 3000 cGy in 10 sessions  . HTN (hypertension)   . Hx of radiation therapy 11/1998   prostate fossa - 6040 cGy, 33 fx, Dr Danny Lawless  . Osteoarthrosis involving, or with mention of more than one site, but not specified as generalized, multiple sites 10/22/2010  . Other abnormal blood chemistry 04/29/1991  . Other and unspecified hyperlipidemia 01/02/2013  . Palpitations 04/28/2004  . Prostate cancer ([place]) 12/16/1994   gleason 7  . Reflux esophagitis 05/10/2008  . Routine general medical examination at a health care facility   . Spinal stenosis, unspecified region other than cervical 04/29/1995  . Type II or unspecified type diabetes mellitus without mention of complication, uncontrolled    Past Surgical History:  Procedure Laterality Date  .  AIR/FLUID EXCHANGE Left 01/09/2016   Procedure: AIR/FLUID EXCHANGE;  Surgeon: Jalene Mullet, MD;  Location: [place];  Service: Ophthalmology;  Laterality: Left;  . CYSTOSCOPY W/ URETERAL STENT PLACEMENT  12/31/14  . CYSTOSCOPY W/ URETERAL STENT PLACEMENT  05/16/2015  . EYE SURGERY  2006   cataract, Dr Shanon Rosser  . LASER PHOTO ABLATION Left 01/09/2016   Procedure: LASER PHOTO ABLATION;  Surgeon: Jalene Mullet, MD;  Location: [place];  Service: Ophthalmology;  Laterality: Left;  Endolaser  . Left ureteral stent placement  Week of 12/05/11  . PARS PLANA VITRECTOMY Left 01/09/2016   Procedure: PARS PLANA VITRECTOMY WITH 25 GAUGE LEFT EYE ;  Surgeon: Jalene Mullet, MD;  Location: [place];  Service: Ophthalmology;  Laterality: Left;  . PERFLUORONE INJECTION Left 01/09/2016   Procedure: PERFLUORONE INJECTION;  Surgeon: Jalene Mullet, MD;  Location: [place];  Service: Ophthalmology;  Laterality: Left;  . PROSTATECTOMY  1996   Dr. Rosana Hoes   Social History:   reports that he quit  smoking about 49 years ago. His smoking use included cigars. He quit after 5.00 years of use. He has never used smokeless tobacco. He reports that he does not drink alcohol or use drugs.  Family History  Problem Relation Age of Onset  . Heart disease Father   . Stroke Sister     Medications: Patient's Medications  New Prescriptions   No medications on file  Previous Medications   ACETAMINOPHEN (TYLENOL) 650 MG CR TABLET    Take 650 mg by mouth as needed for pain.   ASPIRIN 81 MG TABLET    Take 1 tablet (81 mg total) by mouth daily.   CHOLECALCIFEROL 1000 UNITS TABLET    Take 1,000 Units by mouth daily.   DOXAZOSIN (CARDURA) 4 MG TABLET    Take 1 tablet (4 mg total) by mouth daily.   ENZALUTAMIDE (XTANDI) 40 MG CAPSULE    Take 120 mg by mouth daily.    GLUCOSE BLOOD (ONETOUCH VERIO) TEST STRIP    Check blood sugar one to two times a week. Dx:E11.29   LEUPROLIDE, 6 MONTH, (LEUPROLIDE ACETATE, 6 MONTH,) 45 MG INJECTION    Inject 45 mg  into the skin every 6 (six) months. Filled by [facility] (PRINZIDE,ZESTORETIC) 20-12.5 MG TABLET    TAKE 2 TABLETS BY MOUTH ONCE DAILY IN THE MORNING AND 1 TABLET IN THE EVENING FOR BLOOD PRESSURE   METFORMIN (GLUCOPHAGE) 500 MG TABLET    TAKE 1 TABLET BY MOUTH ONCE DAILY WITH  BREAKFAST  TO  CONTROL  BLOOD  SUGAR.   METOPROLOL TARTRATE (LOPRESSOR) 50 MG TABLET    Take 1 tablet (50 mg total) by mouth daily.   OMEGA-3 FATTY ACIDS (FISH OIL) 1000 MG CAPS    Take 1 capsule by mouth daily.   PREGABALIN (LYRICA) 75 MG CAPSULE    Take 1 capsule (75 mg total) by mouth 2 (two) times daily.   PYRIDOXINE (B-6) 100 MG TABLET    Take 100 mg by mouth daily.   SIMVASTATIN (ZOCOR) 5 MG TABLET    TAKE 1 TABLET BY MOUTH ONCE DAILY   TRAMADOL (ULTRAM) 50 MG TABLET    Take 50 mg by mouth as needed for moderate pain.   Modified Medications   No medications on file  Discontinued Medications   No medications on file     Physical Exam:  Vitals:   11/24/18 0823  BP: 138/68  Pulse: 72  Temp: (!) 97.5 F (36.4 C)  TempSrc: Oral  SpO2: 97%  Weight: 261 lb 12.8 oz (118.8 kg)  Height: 5\' 11"  (1.803 m)   Body mass index is 36.51 kg/m.  Physical Exam Constitutional:      Appearance: Normal appearance. He is obese.  HENT:     Head: Normocephalic.     Mouth/Throat:     Mouth: Mucous membranes are moist.     Pharynx: Oropharynx is clear.  Eyes:     General:        Left eye: No discharge.     Comments: +Left upper eyelid minimally swollen  Neck:     Musculoskeletal: Normal range of motion.  Cardiovascular:     Rate and Rhythm: Normal rate and regular rhythm.     Heart sounds: Normal heart sounds.  Pulmonary:     Effort: Pulmonary effort is normal.     Breath sounds: Normal breath sounds.  Abdominal:     General: Bowel sounds are normal.     Palpations: Abdomen is  soft.  Musculoskeletal:     Right lower leg: Edema present.     Left lower leg: Edema present.       Comments: Trace edema to bilateral LE  Skin:    Findings: No rash.  Neurological:     Mental Status: He is alert.    Labs reviewed: Basic Metabolic Panel: Recent Labs    04/12/18 0827 07/19/18 0818 11/08/18 1007 11/21/18 0825  NA 141 140 136 140  K 4.3 4.2 3.7 4.5  CL 109 107 106 105  CO2 25 26 20* 26  GLUCOSE 121* 119* 161* 126*  BUN 27* 25 17 25   CREATININE 1.31* 1.20* 1.05 1.21*  CALCIUM 8.6 8.9 8.7* 8.8  TSH 1.80  --   --   --    Liver Function Tests: Recent Labs    07/19/18 0818 11/08/18 1007 11/21/18 0825  AST 13 17 11   ALT 9 12 8*  ALKPHOS  --  70  --   BILITOT 0.4 0.5 0.5  PROT 6.1 6.1* 5.9*  ALBUMIN  --  3.1*  --    No results for input(s): LIPASE, AMYLASE in the last 8760 hours. No results for input(s): AMMONIA in the last 8760 hours. CBC: Recent Labs    04/12/18 0827 11/08/18 1007 11/21/18 0825  WBC 6.9 8.2 9.3  NEUTROABS 4,244 6.2 5,813  HGB 11.9* 12.3* 12.7*  HCT 34.1* 36.8* 37.2*  MCV 83.0 85.0 84.2  PLT 192 195 210   Lipid Panel: Recent Labs    04/12/18 0827 07/19/18 0818 11/21/18 0825  CHOL 179 162 186  HDL 42 41 43  LDLCALC 117* 104* 119*  TRIG 94 78 125  CHOLHDL 4.3 4.0 4.3   TSH: Recent Labs    04/12/18 0827  TSH 1.80   A1C: Lab Results  Component Value Date   HGBA1C 6.0 (H) 11/21/2018     Assessment/Plan 1. Mixed hyperlipidemia -not controlled, LDL should be <70 due to diabetes, will increase zocor and patient plans to work on dietary modifications as well.  - simvastatin (ZOCOR) 10 MG tablet; Take 1 tablet (10 mg total) by mouth daily.  Dispense: 90 tablet; Refill: 1  2. Herpes zoster without complication Lesions have healed at this time. Continues with neuropathy.   3. Shingles (herpes zoster) polyneuropathy -trailed lyrica but felt like gabapentin was more effective so restarted it. Will continue gabapentin 300 mg BID at this time.   4. Diabetes mellitus due to underlying condition, controlled, with  diabetic nephropathy, without long-term current use of insulin (HCC) A1c trending up, he has been eating more fruit. To continue medication has directed, diabetic diet encouraged.   5. Essential hypertension, benign -blood pressure stable, will continue current regimen. To continue medication with dietary modifications.   6. Primary osteoarthritis involving multiple joints Minimal OA pain at this time. Reports he will use tylenol occasionally. Not taking tramadol.   7. Bilateral edema of lower extremity Stable at this time  8. Malignant neoplasm of prostate [facility]) Ongoing follow up with urology and oncology at [place] at this time.   9. Mitral annular calcification -yearly follow up recommended by cardiologist, to make appt.   Next appt: 3 months, labs prior to visit.  Carlos American. [place], [facility] [place] [phone]

## 2018-11-24 NOTE — Patient Instructions (Addendum)
increase simvastatin 10 mg daily and continue to work on diet changes.   To follow up with Cardiology for yearly check Dr. Elta Jeremiah C. Marlou Porch, MD Cardiologist in [place], [place] Address: [address], [place], [place] [address]   Phone: [phone]   Heart-Healthy Eating Plan Many factors influence your heart (coronary) health, including eating and exercise habits. Coronary risk increases with abnormal blood fat (lipid) levels. Heart-healthy meal planning includes limiting unhealthy fats, increasing healthy fats, and making other diet and lifestyle changes. What are tips for following this plan? Cooking Cook foods using methods other than frying. Baking, boiling, grilling, and broiling are all good options. Other ways to reduce fat include:  Removing the skin from poultry.  Removing all visible fats from meats.  Steaming vegetables in water or broth. Meal planning   At meals, imagine dividing your plate into fourths: ? Fill one-half of your plate with vegetables and green salads. ? Fill one-fourth of your plate with whole grains. ? Fill one-fourth of your plate with lean protein foods.  Eat 4-5 servings of vegetables per day. One serving equals 1 cup raw or cooked vegetable, or 2 cups raw leafy greens.  Eat 4-5 servings of fruit per day. One serving equals 1 medium whole fruit,  cup dried fruit,  cup fresh, frozen, or canned fruit, or  cup 100% fruit juice.  Eat more foods that contain soluble fiber. Examples include apples, broccoli, carrots, beans, peas, and barley. Aim to get 25-30 g of fiber per day.  Increase your consumption of legumes, nuts, and seeds to 4-5 servings per week. One serving of dried beans or legumes equals  cup cooked, 1 serving of nuts is  cup, and 1 serving of seeds equals 1 tablespoon. Fats  Choose healthy fats more often. Choose monounsaturated and polyunsaturated fats, such as olive and canola oils, flaxseeds, walnuts, almonds, and  seeds.  Eat more omega-3 fats. Choose salmon, mackerel, sardines, tuna, flaxseed oil, and ground flaxseeds. Aim to eat fish at least 2 times each week.  Check food labels carefully to identify foods with trans fats or high amounts of saturated fat.  Limit saturated fats. These are found in animal products, such as meats, butter, and cream. Plant sources of saturated fats include palm oil, palm kernel oil, and coconut oil.  Avoid foods with partially hydrogenated oils in them. These contain trans fats. Examples are stick margarine, some tub margarines, cookies, crackers, and other baked goods.  Avoid fried foods. General information  Eat more home-cooked food and less restaurant, buffet, and fast food.  Limit or avoid alcohol.  Limit foods that are high in starch and sugar.  Lose weight if you are overweight. Losing just 5-10% of your body weight can help your overall health and prevent diseases such as diabetes and heart disease.  Monitor your salt (sodium) intake, especially if you have high blood pressure. Talk with your health care provider about your sodium intake.  Try to incorporate more vegetarian meals weekly. What foods can I eat? Fruits All fresh, canned (in natural juice), or frozen fruits. Vegetables Fresh or frozen vegetables (raw, steamed, roasted, or grilled). Green salads. Grains Most grains. Choose whole wheat and whole grains most of the time. Rice and pasta, including brown rice and pastas made with whole wheat. Meats and other proteins Lean, well-trimmed beef, veal, pork, and lamb. Chicken and [place] without skin. All fish and shellfish. Wild duck, rabbit, pheasant, and venison. Egg whites or low-cholesterol egg substitutes.  Dried beans, peas, lentils, and tofu. Seeds and most nuts. Dairy Low-fat or nonfat cheeses, including ricotta and mozzarella. Skim or 1% milk (liquid, powdered, or evaporated). Buttermilk made with low-fat milk. Nonfat or low-fat  yogurt. Fats and oils Non-hydrogenated (trans-free) margarines. Vegetable oils, including soybean, sesame, sunflower, olive, peanut, safflower, corn, canola, and cottonseed. Salad dressings or mayonnaise made with a vegetable oil. Beverages Water (mineral or sparkling). Coffee and tea. Diet carbonated beverages. Sweets and desserts Sherbet, gelatin, and fruit ice. Small amounts of dark chocolate. Limit all sweets and desserts. Seasonings and condiments All seasonings and condiments. The items listed above may not be a complete list of foods and beverages you can eat. Contact a dietitian for more options. What foods are not recommended? Fruits Canned fruit in heavy syrup. Fruit in cream or butter sauce. Fried fruit. Limit coconut. Vegetables Vegetables cooked in cheese, cream, or butter sauce. Fried vegetables. Grains Breads made with saturated or trans fats, oils, or whole milk. Croissants. Sweet rolls. Donuts. High-fat crackers, such as cheese crackers. Meats and other proteins Fatty meats, such as hot dogs, ribs, sausage, bacon, rib-eye roast or steak. High-fat deli meats, such as salami and bologna. Caviar. Domestic duck and goose. Organ meats, such as liver. Dairy Cream, sour cream, cream cheese, and creamed cottage cheese. Whole milk cheeses. Whole or 2% milk (liquid, evaporated, or condensed). Whole buttermilk. Cream sauce or high-fat cheese sauce. Whole-milk yogurt. Fats and oils Meat fat, or shortening. Cocoa butter, hydrogenated oils, palm oil, coconut oil, palm kernel oil. Solid fats and shortenings, including bacon fat, salt pork, lard, and butter. Nondairy cream substitutes. Salad dressings with cheese or sour cream. Beverages Regular sodas and any drinks with added sugar. Sweets and desserts Frosting. Pudding. Cookies. Cakes. Pies. Milk chocolate or white chocolate. Buttered syrups. Full-fat ice cream or ice cream drinks. The items listed above may not be a complete list of  foods and beverages to avoid. Contact a dietitian for more information. Summary  Heart-healthy meal planning includes limiting unhealthy fats, increasing healthy fats, and making other diet and lifestyle changes.  Lose weight if you are overweight. Losing just 5-10% of your body weight can help your overall health and prevent diseases such as diabetes and heart disease.  Focus on eating a balance of foods, including fruits and vegetables, low-fat or nonfat dairy, lean protein, nuts and legumes, whole grains, and heart-healthy oils and fats. This information is not intended to replace advice given to you by your health care provider. Make sure you discuss any questions you have with your health care provider. Document Released: 08/10/2008 Document Revised: 12/09/2017 Document Reviewed: 12/09/2017 Elsevier Interactive Patient Education  2019 [employer].

## 2018-11-27 DIAGNOSIS — B0231 Zoster conjunctivitis: Secondary | ICD-10-CM | POA: Diagnosis not present

## 2018-11-27 DIAGNOSIS — B0239 Other herpes zoster eye disease: Secondary | ICD-10-CM | POA: Diagnosis not present

## 2018-12-04 ENCOUNTER — Encounter: Payer: Self-pay | Admitting: Nurse Practitioner

## 2018-12-05 DIAGNOSIS — B0239 Other herpes zoster eye disease: Secondary | ICD-10-CM | POA: Diagnosis not present

## 2018-12-05 DIAGNOSIS — B0231 Zoster conjunctivitis: Secondary | ICD-10-CM | POA: Diagnosis not present

## 2018-12-12 DIAGNOSIS — I10 Essential (primary) hypertension: Secondary | ICD-10-CM | POA: Diagnosis not present

## 2018-12-12 DIAGNOSIS — Z79818 Long term (current) use of other agents affecting estrogen receptors and estrogen levels: Secondary | ICD-10-CM | POA: Diagnosis not present

## 2018-12-12 DIAGNOSIS — B0230 Zoster ocular disease, unspecified: Secondary | ICD-10-CM | POA: Diagnosis not present

## 2018-12-12 DIAGNOSIS — Z192 Hormone resistant malignancy status: Secondary | ICD-10-CM | POA: Diagnosis not present

## 2018-12-12 DIAGNOSIS — C7951 Secondary malignant neoplasm of bone: Secondary | ICD-10-CM | POA: Diagnosis not present

## 2018-12-12 DIAGNOSIS — C61 Malignant neoplasm of prostate: Secondary | ICD-10-CM | POA: Diagnosis not present

## 2018-12-12 DIAGNOSIS — G629 Polyneuropathy, unspecified: Secondary | ICD-10-CM | POA: Diagnosis not present

## 2018-12-19 ENCOUNTER — Other Ambulatory Visit: Payer: Self-pay | Admitting: *Deleted

## 2018-12-19 DIAGNOSIS — N1330 Unspecified hydronephrosis: Secondary | ICD-10-CM | POA: Diagnosis not present

## 2018-12-19 DIAGNOSIS — Z466 Encounter for fitting and adjustment of urinary device: Secondary | ICD-10-CM | POA: Diagnosis not present

## 2018-12-19 DIAGNOSIS — N320 Bladder-neck obstruction: Secondary | ICD-10-CM | POA: Diagnosis not present

## 2018-12-19 DIAGNOSIS — Z8546 Personal history of malignant neoplasm of prostate: Secondary | ICD-10-CM | POA: Diagnosis not present

## 2018-12-19 DIAGNOSIS — Z960 Presence of urogenital implants: Secondary | ICD-10-CM | POA: Diagnosis not present

## 2018-12-19 DIAGNOSIS — Z87891 Personal history of nicotine dependence: Secondary | ICD-10-CM | POA: Diagnosis not present

## 2018-12-19 DIAGNOSIS — N135 Crossing vessel and stricture of ureter without hydronephrosis: Secondary | ICD-10-CM | POA: Diagnosis not present

## 2018-12-19 DIAGNOSIS — Z9079 Acquired absence of other genital organ(s): Secondary | ICD-10-CM | POA: Diagnosis not present

## 2018-12-19 MED ORDER — METFORMIN HCL 500 MG PO TABS: ORAL_TABLET | ORAL | 1 refills | Status: DC

## 2018-12-19 NOTE — Telephone Encounter (Signed)
[place]

## 2019-01-24 ENCOUNTER — Other Ambulatory Visit: Payer: Self-pay | Admitting: *Deleted

## 2019-01-24 DIAGNOSIS — I10 Essential (primary) hypertension: Secondary | ICD-10-CM

## 2019-01-24 MED ORDER — LISINOPRIL-HYDROCHLOROTHIAZIDE 20-12.5 MG PO TABS: ORAL_TABLET | ORAL | 1 refills | Status: DC

## 2019-01-24 NOTE — Telephone Encounter (Signed)
[place]

## 2019-02-21 ENCOUNTER — Other Ambulatory Visit: Payer: Medicare Other

## 2019-02-21 ENCOUNTER — Other Ambulatory Visit: Payer: Self-pay

## 2019-02-21 DIAGNOSIS — E0821 Diabetes mellitus due to underlying condition with diabetic nephropathy: Secondary | ICD-10-CM

## 2019-02-21 DIAGNOSIS — E782 Mixed hyperlipidemia: Secondary | ICD-10-CM | POA: Diagnosis not present

## 2019-02-22 LAB — COMPLETE METABOLIC PANEL WITH GFR
AG Ratio: 1.6 (calc) (ref 1.0–2.5)
ALT: 8 U/L — ABNORMAL LOW (ref 9–46)
AST: 11 U/L (ref 10–35)
Albumin: 3.6 g/dL (ref 3.6–5.1)
Alkaline phosphatase (APISO): 79 U/L (ref 35–144)
BUN/Creatinine Ratio: 22 (calc) (ref 6–22)
BUN: 25 mg/dL (ref 7–25)
CO2: 25 mmol/L (ref 20–32)
Calcium: 8.6 mg/dL (ref 8.6–10.3)
Chloride: 107 mmol/L (ref 98–110)
Creat: 1.13 mg/dL — ABNORMAL HIGH (ref 0.70–1.11)
GFR, Est African American: 70 mL/min/{1.73_m2} (ref >=60)
GFR, Est Non African American: 60 mL/min/{1.73_m2} (ref >=60)
Globulin: 2.3 g/dL (calc) (ref 1.9–3.7)
Glucose, Bld: 128 mg/dL — ABNORMAL HIGH (ref 65–99)
Potassium: 4.3 mmol/L (ref 3.5–5.3)
Sodium: 139 mmol/L (ref 135–146)
Total Bilirubin: 0.5 mg/dL (ref 0.2–1.2)
Total Protein: 5.9 g/dL — ABNORMAL LOW (ref 6.1–8.1)

## 2019-02-22 LAB — HEMOGLOBIN A1C
Hgb A1c MFr Bld: 5.6 % of total Hgb (ref <5.7)
Mean Plasma Glucose: 114 (calc)
eAG (mmol/L): 6.3 (calc)

## 2019-02-22 LAB — LIPID PANEL
Cholesterol: 160 mg/dL (ref <200)
HDL: 45 mg/dL (ref >=40)
LDL Cholesterol (Calc): 97 mg/dL (calc)
Non-HDL Cholesterol (Calc): 115 mg/dL (calc) (ref <130)
Total CHOL/HDL Ratio: 3.6 (calc) (ref <5.0)
Triglycerides: 88 mg/dL (ref <150)

## 2019-02-23 ENCOUNTER — Ambulatory Visit: Payer: Medicare Other | Admitting: Nurse Practitioner

## 2019-02-26 ENCOUNTER — Other Ambulatory Visit: Payer: Self-pay | Admitting: *Deleted

## 2019-02-26 MED ORDER — DOXAZOSIN MESYLATE 4 MG PO TABS: 4.0000 mg | ORAL_TABLET | Freq: Every day | ORAL | 0 refills | Status: DC

## 2019-03-02 ENCOUNTER — Encounter: Payer: Self-pay | Admitting: Nurse Practitioner

## 2019-03-02 ENCOUNTER — Other Ambulatory Visit: Payer: Self-pay

## 2019-03-02 ENCOUNTER — Ambulatory Visit: Payer: Medicare Other | Admitting: Nurse Practitioner

## 2019-03-02 ENCOUNTER — Ambulatory Visit: Payer: Self-pay | Admitting: Nurse Practitioner

## 2019-03-02 ENCOUNTER — Telehealth: Payer: Self-pay

## 2019-03-02 ENCOUNTER — Ambulatory Visit (INDEPENDENT_AMBULATORY_CARE_PROVIDER_SITE_OTHER): Payer: Medicare Other | Admitting: Nurse Practitioner

## 2019-03-02 DIAGNOSIS — E1122 Type 2 diabetes mellitus with diabetic chronic kidney disease: Secondary | ICD-10-CM

## 2019-03-02 DIAGNOSIS — I10 Essential (primary) hypertension: Secondary | ICD-10-CM

## 2019-03-02 DIAGNOSIS — C61 Malignant neoplasm of prostate: Secondary | ICD-10-CM

## 2019-03-02 DIAGNOSIS — R600 Localized edema: Secondary | ICD-10-CM | POA: Diagnosis not present

## 2019-03-02 DIAGNOSIS — I420 Dilated cardiomyopathy: Secondary | ICD-10-CM | POA: Diagnosis not present

## 2019-03-02 DIAGNOSIS — I517 Cardiomegaly: Secondary | ICD-10-CM

## 2019-03-02 DIAGNOSIS — N182 Chronic kidney disease, stage 2 (mild): Secondary | ICD-10-CM

## 2019-03-02 DIAGNOSIS — E782 Mixed hyperlipidemia: Secondary | ICD-10-CM

## 2019-03-02 MED ORDER — METOPROLOL TARTRATE 50 MG PO TABS: 50.0000 mg | ORAL_TABLET | Freq: Two times a day (BID) | ORAL | 1 refills | Status: DC

## 2019-03-02 MED ORDER — SIMVASTATIN 20 MG PO TABS: 20.0000 mg | ORAL_TABLET | Freq: Every day | ORAL | 1 refills | Status: DC

## 2019-03-02 MED ORDER — LISINOPRIL-HYDROCHLOROTHIAZIDE 20-12.5 MG PO TABS: ORAL_TABLET | ORAL | 1 refills | Status: DC

## 2019-03-02 MED ORDER — LISINOPRIL-HYDROCHLOROTHIAZIDE 20-12.5 MG PO TABS: 1.0000 | ORAL_TABLET | Freq: Two times a day (BID) | ORAL | 1 refills | Status: DC

## 2019-03-02 NOTE — Patient Instructions (Addendum)
To increase metoprolol 50 mg by mouth to twice daily  Continue lisinopril/hctz twice daily  To take blood pressure 1 hour after medication either in the evening or in the morning and record bp and hr follow up in 2 weeks, notify sooner if needed for side effects or low HR.   To call cardiologist and set up follow up  Dr. Elta Guadeloupe C. Marlou Porch, MD Cardiologist in [place], [place] Address: [address], [place], [place] [address]   Phone: [phone]   [place] stands for "Dietary Approaches to Stop Hypertension." The DASH eating plan is a healthy eating plan that has been shown to reduce high blood pressure (hypertension). It may also reduce your risk for type 2 diabetes, heart disease, and stroke. The DASH eating plan may also help with weight loss. What are tips for following this plan?  General guidelines  Avoid eating more than 2,300 mg (milligrams) of salt (sodium) a day. If you have hypertension, you may need to reduce your sodium intake to 1,500 mg a day.  Limit alcohol intake to no more than 1 drink a day for nonpregnant women and 2 drinks a day for men. One drink equals 12 oz of beer, 5 oz of wine, or 1 oz of hard liquor.  Work with your health care provider to maintain a healthy body weight or to lose weight. Ask what an ideal weight is for you.  Get at least 30 minutes of exercise that causes your heart to beat faster (aerobic exercise) most days of the week. Activities may include walking, swimming, or biking.  Work with your health care provider or diet and nutrition specialist (dietitian) to adjust your eating plan to your individual calorie needs. Reading food labels   Check food labels for the amount of sodium per serving. Choose foods with less than 5 percent of the Daily Value of sodium. Generally, foods with less than 300 mg of sodium per serving fit into this eating plan.  To find whole grains, look for the word "whole" as the first word in  the ingredient list. Shopping  Buy products labeled as "low-sodium" or "no salt added."  Buy fresh foods. Avoid canned foods and premade or frozen meals. Cooking  Avoid adding salt when cooking. Use salt-free seasonings or herbs instead of table salt or sea salt. Check with your health care provider or pharmacist before using salt substitutes.  Do not fry foods. Cook foods using healthy methods such as baking, boiling, grilling, and broiling instead.  Cook with heart-healthy oils, such as olive, canola, soybean, or sunflower oil. Meal planning  Eat a balanced diet that includes: ? 5 or more servings of fruits and vegetables each day. At each meal, try to fill half of your plate with fruits and vegetables. ? Up to 6-8 servings of whole grains each day. ? Less than 6 oz of lean meat, poultry, or fish each day. A 3-oz serving of meat is about the same size as a deck of cards. One egg equals 1 oz. ? 2 servings of low-fat dairy each day. ? A serving of nuts, seeds, or beans 5 times each week. ? Heart-healthy fats. Healthy fats called Omega-3 fatty acids are found in foods such as flaxseeds and coldwater fish, like sardines, salmon, and mackerel.  Limit how much you eat of the following: ? Canned or prepackaged foods. ? Food that is high in trans fat, such as fried foods. ? Food that is high in  saturated fat, such as fatty meat. ? Sweets, desserts, sugary drinks, and other foods with added sugar. ? Full-fat dairy products.  Do not salt foods before eating.  Try to eat at least 2 vegetarian meals each week.  Eat more home-cooked food and less restaurant, buffet, and fast food.  When eating at a restaurant, ask that your food be prepared with less salt or no salt, if possible. What foods are recommended? The items listed may not be a complete list. Talk with your dietitian about what dietary choices are best for you. Grains Whole-grain or whole-wheat bread. Whole-grain or whole-wheat  pasta. Brown rice. [place]. Bulgur. Whole-grain and low-sodium cereals. Pita bread. Low-fat, low-sodium crackers. Whole-wheat flour tortillas. Vegetables Fresh or frozen vegetables (raw, steamed, roasted, or grilled). Low-sodium or reduced-sodium tomato and vegetable juice. Low-sodium or reduced-sodium tomato sauce and tomato paste. Low-sodium or reduced-sodium canned vegetables. Fruits All fresh, dried, or frozen fruit. Canned fruit in natural juice (without added sugar). Meat and other protein foods Skinless chicken or [place]. Ground chicken or [place]. Pork with fat trimmed off. Fish and seafood. Egg whites. Dried beans, peas, or lentils. Unsalted nuts, nut butters, and seeds. Unsalted canned beans. Lean cuts of beef with fat trimmed off. Low-sodium, lean deli meat. Dairy Low-fat (1%) or fat-free (skim) milk. Fat-free, low-fat, or reduced-fat cheeses. Nonfat, low-sodium ricotta or cottage cheese. Low-fat or nonfat yogurt. Low-fat, low-sodium cheese. Fats and oils Soft margarine without trans fats. Vegetable oil. Low-fat, reduced-fat, or light mayonnaise and salad dressings (reduced-sodium). Canola, safflower, olive, soybean, and sunflower oils. Avocado. Seasoning and other foods Herbs. Spices. Seasoning mixes without salt. Unsalted popcorn and pretzels. Fat-free sweets. What foods are not recommended? The items listed may not be a complete list. Talk with your dietitian about what dietary choices are best for you. Grains Baked goods made with fat, such as croissants, muffins, or some breads. Dry pasta or rice meal packs. Vegetables Creamed or fried vegetables. Vegetables in a cheese sauce. Regular canned vegetables (not low-sodium or reduced-sodium). Regular canned tomato sauce and paste (not low-sodium or reduced-sodium). Regular tomato and vegetable juice (not low-sodium or reduced-sodium). Angie Fava. Olives. Fruits Canned fruit in a light or heavy syrup. Fried fruit. Fruit in cream or  butter sauce. Meat and other protein foods Fatty cuts of meat. Ribs. Fried meat. Berniece Salines. Sausage. Bologna and other processed lunch meats. Salami. Fatback. Hotdogs. Bratwurst. Salted nuts and seeds. Canned beans with added salt. Canned or smoked fish. Whole eggs or egg yolks. Chicken or [place] with skin. Dairy Whole or 2% milk, cream, and half-and-half. Whole or full-fat cream cheese. Whole-fat or sweetened yogurt. Full-fat cheese. Nondairy creamers. Whipped toppings. Processed cheese and cheese spreads. Fats and oils Butter. Stick margarine. Lard. Shortening. Ghee. Bacon fat. Tropical oils, such as coconut, palm kernel, or palm oil. Seasoning and other foods Salted popcorn and pretzels. Onion salt, garlic salt, seasoned salt, table salt, and sea salt. Worcestershire sauce. Tartar sauce. Barbecue sauce. Teriyaki sauce. Soy sauce, including reduced-sodium. Steak sauce. Canned and packaged gravies. Fish sauce. Oyster sauce. Cocktail sauce. Horseradish that you find on the shelf. Ketchup. Mustard. Meat flavorings and tenderizers. Bouillon cubes. Hot sauce and Tabasco sauce. Premade or packaged marinades. Premade or packaged taco seasonings. Relishes. Regular salad dressings. Where to find more information:  National Heart, Lung, and [place]: https://wilson-eaton.com/  American Heart Association: www.heart.org Summary  The DASH eating plan is a healthy eating plan that has been shown to reduce high blood pressure (hypertension). It may also  reduce your risk for type 2 diabetes, heart disease, and stroke.  With the DASH eating plan, you should limit salt (sodium) intake to 2,300 mg a day. If you have hypertension, you may need to reduce your sodium intake to 1,500 mg a day.  When on the DASH eating plan, aim to eat more fresh fruits and vegetables, whole grains, lean proteins, low-fat dairy, and heart-healthy fats.  Work with your health care provider or diet and nutrition specialist (dietitian) to  adjust your eating plan to your individual calorie needs. This information is not intended to replace advice given to you by your health care provider. Make sure you discuss any questions you have with your health care provider. Document Released: 10/21/2011 Document Revised: 10/25/2016 Document Reviewed: 10/25/2016 Elsevier Interactive Patient Education  2019 [employer].

## 2019-03-02 NOTE — Telephone Encounter (Signed)
Left message on voicemail requesting last diabetic eye exam. Dr.Gould's office is temporarily closed on Friday's

## 2019-03-02 NOTE — Progress Notes (Signed)
This service is provided via telemedicine  No vital signs collected/recorded due to the encounter was a telemedicine visit.   Location of patient (ex: home, work):  Home  Patient consents to a telephone visit: Yes  Location of the provider (ex: office, home):  [employer], Office   Names of all persons participating in the telemedicine service and their role in the encounter: S.Chrae B/CMA, Sherrie Mustache, NP, and Patient   Time spent on call:  12 min with medical assistant  Virtual Visit via Telephone Note  I connected with Jeremiah Collins on 03/02/19 at  8:30 AM EDT by telephone and verified that I am speaking with the correct person using two identifiers.   I discussed the limitations, risks, security and privacy concerns of performing an evaluation and management service by telephone and the availability of in person appointments. I also discussed with the patient that there may be a patient responsible charge related to this service. The patient expressed understanding and agreed to proceed.      Careteam: Patient Care Team: Lauree Chandler, NP as PCP - General (Geriatric Medicine) Myrlene Broker, MD as Attending Physician (Urology) [name], MD as Consulting Physician (Ophthalmology) [name], MD as Consulting Physician (Ophthalmology)  Advanced Directive information    Allergies  Allergen Reactions  . Abiraterone Nausea And Vomiting and Other (See Comments)    Other reaction(s): Increased Heart Rate (intolerance)     Chief Complaint  Patient presents with  . Medical Management of Chronic Issues    3 month follow-up, discuss labs (copy printed and mailed). Tele-visit   . Medication Management    Discuss Gabapentin and need for   . Immunizations    Discuss need for Shingrix   . Best Practice Recommendations    Per patient eye exam up to date with Dr.Gould      HPI: Patient is a 85 y.o. male for routine follow up.  Reports he is  not taking his gabapentin 300 mg  States he does not feel like he ever taken it but reports neuropathy in both feet. Reports a "dead" feeling. Reports numbness. "kinda painful" does inspect feet every day. Dull feeling. 2-3/10 pain. Taking b vitamins to help with neuropathy.   Obesity - ongoing, has not lost weight. Does not follow diet modifications. no routine exercise but every active.   Shingles- resolved. Some left sided numbness and around eye. Denies pain.   mitral annular calcification -Followed by cardio Dr Leonia Reeves NP but missed last appt in Oct 2019 unsure why, last follow up in oct 2018 2D echo revealed mild AS and moderate focal basal hypertrophy of septum with mild concentric hypertrophy and nml EF (60-65%); grade 1 DD. He has chronic SOB without changes. No CP or dizziness. With chronic edema, unchanged. Number was given at last office visit and he did not make appt.   hx prostate CA and rec'd XRT for bone mets (left tibia and pelvis) - He is currently on injection q72mos. He also takes cardura. PSA stable on Xtandi. He gets surveillance CT chest/abd/pelvis and [facility]. No changes compared to previous studies. Followed by urology Dr Rosana Hoes and oncology Dr Marcello Moores. LDH 121; PSA <0.01. He had ureteral stent change in Feb 2020 and stent changed q55mos.   DM - No low BS reactions. He takes metformin. A1c 5.6%. Eye exam . No diabetic changes -Followed by Dr Delman Cheadle. He has noticed numbness/tingling in feet  since beginning [place].   HTN - 155/85 on lisinopril-HCT and metoprolol. Takes ASA daily  Hyperlipidemia -improved with increase to 10 mg simvastatin but not at goal; no side effects with increase in dose.  Occasional muscle weakness. LDL 97 improved from 119  Multiple joint pain - mostly in back. Has a lot of joint pain but no worse. Does not need to take medication for this. He takes prn tylenol when he needs it.   Hx anemia - stable.  Hgb 12.7  CKD - stage 2. Cr 1.13 Stable    Review of Systems:  Review of Systems  Constitutional: Negative for chills, fever and weight loss.  HENT: Negative for tinnitus.   Respiratory: Negative for cough, sputum production and shortness of breath.   Cardiovascular: Positive for leg swelling (stable). Negative for chest pain and palpitations.  Gastrointestinal: Negative for abdominal pain, constipation, diarrhea and heartburn.  Genitourinary: Positive for frequency. Negative for dysuria and urgency.  Musculoskeletal: Positive for back pain (occasionally). Negative for falls, joint pain and myalgias.  Skin: Negative.   Neurological: Positive for tingling. Negative for dizziness and headaches.  Psychiatric/Behavioral: Positive for memory loss. Negative for depression. The patient does not have insomnia.     Past Medical History:  Diagnosis Date  . Acute bronchitis 09/12/2012  . 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 Bone metastases ([place])    left mid tibial shaft  . Cataract    Dr.Groat  . Cellulitis and abscess of leg, except foot 01/17/2012  . Chest pain, unspecified 04/28/2004  . Closed fracture of five ribs 03/24/2004  . First degree atrioventricular block 04/25/2007  . Gross hematuria 09/06/2011  . History of radiation therapy 04/03/14- 04/17/14   mid to distal left tibia 3000 cGy in 10 sessions  . HTN (hypertension)   . Hx of radiation therapy 11/1998   prostate fossa - 6040 cGy, 33 fx, Dr Danny Lawless  . Osteoarthrosis involving, or with mention of more than one site, but not specified as generalized, multiple sites 10/22/2010  . Other abnormal blood chemistry 04/29/1991  . Other and unspecified hyperlipidemia 01/02/2013  . Palpitations 04/28/2004  . Prostate cancer ([place]) 12/16/1994   gleason 7  . Reflux esophagitis 05/10/2008  . Routine general medical examination at a health care facility   . Spinal stenosis, unspecified region other than cervical 04/29/1995  . Type II or  unspecified type diabetes mellitus without mention of complication, uncontrolled    Past Surgical History:  Procedure Laterality Date  . AIR/FLUID EXCHANGE Left 01/09/2016   Procedure: AIR/FLUID EXCHANGE;  Surgeon: Jalene Mullet, MD;  Location: [place];  Service: Ophthalmology;  Laterality: Left;  . CYSTOSCOPY W/ URETERAL STENT PLACEMENT  12/31/14  . CYSTOSCOPY W/ URETERAL STENT PLACEMENT  05/16/2015  . EYE SURGERY  2006   cataract, Dr Shanon Rosser  . LASER PHOTO ABLATION Left 01/09/2016   Procedure: LASER PHOTO ABLATION;  Surgeon: Jalene Mullet, MD;  Location: [place];  Service: Ophthalmology;  Laterality: Left;  Endolaser  . Left ureteral stent placement  Week of 12/05/11  . PARS PLANA VITRECTOMY Left 01/09/2016   Procedure: PARS PLANA VITRECTOMY WITH 25 GAUGE LEFT EYE ;  Surgeon: Jalene Mullet, MD;  Location: [place];  Service: Ophthalmology;  Laterality: Left;  . PERFLUORONE INJECTION Left 01/09/2016   Procedure: PERFLUORONE INJECTION;  Surgeon: Jalene Mullet, MD;  Location: [place];  Service: Ophthalmology;  Laterality: Left;  . PROSTATECTOMY  1996   Dr. Rosana Hoes   Social History:   reports that he quit smoking about  50 years ago. His smoking use included cigars. He quit after 5.00 years of use. He has never used smokeless tobacco. He reports that he does not drink alcohol or use drugs.  Family History  Problem Relation Age of Onset  . Heart disease Father   . Stroke Sister     Medications: Patient's Medications  New Prescriptions   No medications on file  Previous Medications   ACETAMINOPHEN (TYLENOL) 650 MG CR TABLET    Take 650 mg by mouth as needed for pain.   ASPIRIN 81 MG TABLET    Take 1 tablet (81 mg total) by mouth daily.   CALCIUM CARBONATE (TUMS - DOSED IN MG ELEMENTAL CALCIUM) 500 MG CHEWABLE TABLET    Chew 1 tablet by mouth as needed for indigestion or heartburn.   CHOLECALCIFEROL 1000 UNITS TABLET    Take 1,000 Units by mouth daily.   DOXAZOSIN (CARDURA) 4 MG TABLET    Take 1  tablet (4 mg total) by mouth daily.   ENZALUTAMIDE (XTANDI) 40 MG CAPSULE    Take 120 mg by mouth daily.    GABAPENTIN (NEURONTIN) 300 MG CAPSULE    Take 300 mg by mouth 2 (two) times daily. 1 in morning and 1 at bedtime   GLUCOSE BLOOD (ONETOUCH VERIO) TEST STRIP    Check blood sugar one to two times a week. Dx:E11.29   LEUPROLIDE, 6 MONTH, (LEUPROLIDE ACETATE, 6 MONTH,) 45 MG INJECTION    Inject 45 mg into the skin every 6 (six) months. Filled by [place] (PRINZIDE,ZESTORETIC) 20-12.5 MG TABLET    Take two tablets by mouth once daily in the morning and take One tablet by mouth in the evening for blood pressure   METFORMIN (GLUCOPHAGE) 500 MG TABLET    Take one tablet by mouth once daily with breakfast to control blood sugar   METOPROLOL TARTRATE (LOPRESSOR) 50 MG TABLET    Take 1 tablet (50 mg total) by mouth daily.   OMEGA-3 FATTY ACIDS (FISH OIL) 1000 MG CAPS    Take 1 capsule by mouth daily.   PYRIDOXINE (B-6) 100 MG TABLET    Take 100 mg by mouth daily.   SIMVASTATIN (ZOCOR) 10 MG TABLET    Take 1 tablet (10 mg total) by mouth daily.   ZOSTER VACCINE ADJUVANTED [facility]) INJECTION    Inject 0.5 mLs into the muscle once.  Modified Medications   No medications on file  Discontinued Medications   NEOMYCIN-POLYMYXIN B-DEXAMETHASONE (MAXITROL) 3.5-10000-0.1 SUSP    Taking 1 drop in Left eye in morning and 1 drop in left eye at bedtime for shingles     Physical Exam: unable due to tele-visit.     Labs reviewed: Basic Metabolic Panel: Recent Labs    04/12/18 0827  11/08/18 1007 11/21/18 0825 02/21/19 0824  NA 141   < > 136 140 139  K 4.3   < > 3.7 4.5 4.3  CL 109   < > 106 105 107  CO2 25   < > 20* 26 25  GLUCOSE 121*   < > 161* 126* 128*  BUN 27*   < > 17 25 25   CREATININE 1.31*   < > 1.05 1.21* 1.13*  CALCIUM 8.6   < > 8.7* 8.8 8.6  TSH 1.80  --   --   --   --    < > = values in this interval not displayed.   Liver Function  Tests: Recent Labs  11/08/18 1007 11/21/18 0825 02/21/19 0824  AST 17 11 11   ALT 12 8* 8*  ALKPHOS 70  --   --   BILITOT 0.5 0.5 0.5  PROT 6.1* 5.9* 5.9*  ALBUMIN 3.1*  --   --    No results for input(s): LIPASE, AMYLASE in the last 8760 hours. No results for input(s): AMMONIA in the last 8760 hours. CBC: Recent Labs    04/12/18 0827 11/08/18 1007 11/21/18 0825  WBC 6.9 8.2 9.3  NEUTROABS 4,244 6.2 5,813  HGB 11.9* 12.3* 12.7*  HCT 34.1* 36.8* 37.2*  MCV 83.0 85.0 84.2  PLT 192 195 210   Lipid Panel: Recent Labs    07/19/18 0818 11/21/18 0825 02/21/19 0824  CHOL 162 186 160  HDL 41 43 45  LDLCALC 104* 119* 97  TRIG 78 125 88  CHOLHDL 4.0 4.3 3.6   TSH: Recent Labs    04/12/18 0827  TSH 1.80   A1C: Lab Results  Component Value Date   HGBA1C 5.6 02/21/2019     Assessment/Plan 1. Congestive dilated cardiomyopathy (HCC) Symptoms stable, missed follow up with cardiology, again number given and encouraged follow up, last Echo in 2018 with  EF (60-65%); grade 1 DD  2. Bilateral edema of lower extremity Stable, will continue current regimen, continue with dietary modifications.   3. Controlled type 2 diabetes mellitus with stage 2 chronic kidney disease, without long-term current use of insulin (HCC) A1c improved, continues on metformin 500 mg daily  4. Essential hypertension, benign -elevated, dash diet encouraged. -will increase metoprolol 50 mg by mouth to twice daily - CBC with Differential/Platelet; Future - lisinopril-hydrochlorothiazide (ZESTORETIC) 20-12.5 MG tablet; Take 1 tablet by mouth 2 (two) times daily.  Dispense: 90 tablet; Refill: 1 - metoprolol tartrate (LOPRESSOR) 50 MG tablet; Take 1 tablet (50 mg total) by mouth 2 (two) times daily.  Dispense: 90 tablet; Refill: 1 -instructed to check bp and HR after medication either in the morning or evening and record. To notify if HR remains low or side effects occur. Will follow up in 2 weeks.    5. Malignant neoplasm of prostate [facility]) Ongoing follow up with urologist at this time. Continue with symptoms but manageable at this time.   6. Mixed hyperlipidemia Uncontrolled. No side effects from zocor 10 mg, will increase to 20 mg at this time and continue to work on dietary modification.  - simvastatin (ZOCOR) 20 MG tablet; Take 1 tablet (20 mg total) by mouth daily.  Dispense: 90 tablet; Refill: 1 - COMPLETE METABOLIC PANEL WITH GFR; Future - Lipid Panel; Future  7. LVH (left ventricular hypertrophy) - metoprolol tartrate (LOPRESSOR) 50 MG tablet; Take 1 tablet (50 mg total) by mouth 2 (two) times daily.  Dispense: 90 tablet; Refill: 1   Next appt: 2 week for blood pressure and HR check and 4 months for routine follow up with labs before.  Carlos [employer]. Harle Battiest  [facility] [phone]  Follow Up Instructions:    I discussed the assessment and treatment plan with the patient. The patient was provided an opportunity to ask questions and all were answered. The patient agreed with the plan and demonstrated an understanding of the instructions.   The patient was advised to call back or seek an in-person evaluation if the symptoms worsen or if the condition fails to improve as anticipated.  I provided 30 minutes of non-face-to-face time during this encounter.   Lauree Chandler, NP

## 2019-03-06 DIAGNOSIS — I10 Essential (primary) hypertension: Secondary | ICD-10-CM | POA: Diagnosis not present

## 2019-03-06 DIAGNOSIS — C61 Malignant neoplasm of prostate: Secondary | ICD-10-CM | POA: Diagnosis not present

## 2019-03-06 DIAGNOSIS — C7951 Secondary malignant neoplasm of bone: Secondary | ICD-10-CM | POA: Diagnosis not present

## 2019-03-06 DIAGNOSIS — Z192 Hormone resistant malignancy status: Secondary | ICD-10-CM | POA: Diagnosis not present

## 2019-03-07 ENCOUNTER — Encounter: Payer: Self-pay | Admitting: *Deleted

## 2019-03-15 DIAGNOSIS — Z1159 Encounter for screening for other viral diseases: Secondary | ICD-10-CM | POA: Diagnosis not present

## 2019-03-16 ENCOUNTER — Ambulatory Visit (INDEPENDENT_AMBULATORY_CARE_PROVIDER_SITE_OTHER): Payer: Medicare Other | Admitting: Nurse Practitioner

## 2019-03-16 ENCOUNTER — Other Ambulatory Visit: Payer: Self-pay

## 2019-03-16 ENCOUNTER — Encounter: Payer: Self-pay | Admitting: Nurse Practitioner

## 2019-03-16 DIAGNOSIS — R600 Localized edema: Secondary | ICD-10-CM | POA: Diagnosis not present

## 2019-03-16 DIAGNOSIS — I10 Essential (primary) hypertension: Secondary | ICD-10-CM | POA: Diagnosis not present

## 2019-03-16 MED ORDER — AMLODIPINE BESYLATE 2.5 MG PO TABS: 2.5000 mg | ORAL_TABLET | Freq: Every day | ORAL | 1 refills | Status: DC

## 2019-03-16 NOTE — Progress Notes (Signed)
This service is provided via telemedicine  No vital signs collected/recorded due to the encounter was a telemedicine visit.   Location of patient (ex: home, work): Home   Patient consents to a telephone visit:  Yes  Location of the provider (ex: office, home):  Home  Name of any referring provider:  N/A  Names of all persons participating in the telemedicine service and their role in the encounter:  S.Chrae B/CMA, Sherrie Mustache, NP, and Patient   Time spent on call: 8 min with medical assistant   Virtual Visit via Telephone Note  I connected with Jeremiah Collins on 03/16/19 at  9:00 AM EDT by telephone and verified that I am speaking with the correct person using two identifiers.  Location: Patient: HOME Provider: OFFICE    I discussed the limitations, risks, security and privacy concerns of performing an evaluation and management service by telephone and the availability of in person appointments. I also discussed with the patient that there may be a patient responsible charge related to this service. The patient expressed understanding and agreed to proceed.     Careteam: Patient Care Team: Lauree Chandler, NP as PCP - General (Geriatric Medicine) Myrlene Broker, MD as Attending Physician (Urology) [name], MD as Consulting Physician (Ophthalmology) [name], MD as Consulting Physician (Ophthalmology)  Advanced Directive information Does Patient Have a Medical Advance Directive?: No, Does patient want to make changes to medical advance directive?: No - Patient declined  Allergies  Allergen Reactions  . Abiraterone Nausea And Vomiting and Other (See Comments)    Other reaction(s): Increased Heart Rate (intolerance)     Chief Complaint  Patient presents with  . Medical Management of Chronic Issues    2 week B/P and weight check. Televisit   . Immunizations    Discuss need for shingrix      HPI: Patient is a 85 y.o. male FOR BLOOD  PRESSURE AND HR  Reports blood pressure is around 140s 142/70 1 hour after medication, HR 55 A few hours after his medication 123/59 HR 64- he had this reading once. HR 50s-60s  140/76, 52 144/81 57 144/72 53 144/71 52 150/85 50 145/79 52 Denies feels of ightheaded or dizziness, no headaches Pt is taking lisinopril-hctz 20-12.5, metoprolol 50 mg by mouth twice daily.  Following a low sodium diet.  Reports trace edema.  None in the morning.   Review of Systems:  Review of Systems  Constitutional: Negative for chills, fever and weight loss.  HENT: Negative for tinnitus.   Respiratory: Negative for cough, sputum production and shortness of breath.   Cardiovascular: Positive for leg swelling (stable). Negative for chest pain and palpitations.  Gastrointestinal: Negative for abdominal pain, constipation, diarrhea and heartburn.  Genitourinary: Positive for frequency. Negative for dysuria and urgency.  Musculoskeletal: Positive for back pain (occasionally). Negative for falls, joint pain and myalgias.  Skin: Negative.   Neurological: Positive for tingling. Negative for dizziness and headaches.  Psychiatric/Behavioral: Positive for memory loss. Negative for depression. The patient does not have insomnia.     Past Medical History:  Diagnosis Date  . Acute bronchitis 09/12/2012  . 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 Bone metastases ([place])    left mid tibial shaft  . Cataract    Dr.Groat  . Cellulitis and abscess of leg, except foot 01/17/2012  . Chest pain, unspecified 04/28/2004  . Closed fracture of five ribs 03/24/2004  . First degree atrioventricular block 04/25/2007  . Gross hematuria 09/06/2011  .  History of radiation therapy 04/03/14- 04/17/14   mid to distal left tibia 3000 cGy in 10 sessions  . HTN (hypertension)   . Hx of radiation therapy 11/1998   prostate fossa - 6040 cGy, 33 fx, Dr Danny Lawless  . Osteoarthrosis involving, or with mention of more than one site, but not  specified as generalized, multiple sites 10/22/2010  . Other abnormal blood chemistry 04/29/1991  . Other and unspecified hyperlipidemia 01/02/2013  . Palpitations 04/28/2004  . Prostate cancer ([place]) 12/16/1994   gleason 7  . Reflux esophagitis 05/10/2008  . Routine general medical examination at a health care facility   . Spinal stenosis, unspecified region other than cervical 04/29/1995  . Type II or unspecified type diabetes mellitus without mention of complication, uncontrolled    Past Surgical History:  Procedure Laterality Date  . AIR/FLUID EXCHANGE Left 01/09/2016   Procedure: AIR/FLUID EXCHANGE;  Surgeon: Jalene Mullet, MD;  Location: [place];  Service: Ophthalmology;  Laterality: Left;  . CYSTOSCOPY W/ URETERAL STENT PLACEMENT  12/31/14  . CYSTOSCOPY W/ URETERAL STENT PLACEMENT  05/16/2015  . EYE SURGERY  2006   cataract, Dr Shanon Rosser  . LASER PHOTO ABLATION Left 01/09/2016   Procedure: LASER PHOTO ABLATION;  Surgeon: Jalene Mullet, MD;  Location: [place];  Service: Ophthalmology;  Laterality: Left;  Endolaser  . Left ureteral stent placement  Week of 12/05/11  . PARS PLANA VITRECTOMY Left 01/09/2016   Procedure: PARS PLANA VITRECTOMY WITH 25 GAUGE LEFT EYE ;  Surgeon: Jalene Mullet, MD;  Location: [place];  Service: Ophthalmology;  Laterality: Left;  . PERFLUORONE INJECTION Left 01/09/2016   Procedure: PERFLUORONE INJECTION;  Surgeon: Jalene Mullet, MD;  Location: [place];  Service: Ophthalmology;  Laterality: Left;  . PROSTATECTOMY  1996   Dr. Rosana Hoes   Social History:   reports that he quit smoking about 50 years ago. His smoking use included cigars. He quit after 5.00 years of use. He has never used smokeless tobacco. He reports that he does not drink alcohol or use drugs.  Family History  Problem Relation Age of Onset  . Heart disease Father   . Stroke Sister     Medications: Patient's Medications  New Prescriptions   No medications on file  Previous Medications   ACETAMINOPHEN  (TYLENOL) 650 MG CR TABLET    Take 650 mg by mouth as needed for pain.   ASPIRIN 81 MG TABLET    Take 1 tablet (81 mg total) by mouth daily.   CALCIUM CARBONATE (TUMS - DOSED IN MG ELEMENTAL CALCIUM) 500 MG CHEWABLE TABLET    Chew 1 tablet by mouth as needed for indigestion or heartburn.   CHOLECALCIFEROL 1000 UNITS TABLET    Take 1,000 Units by mouth daily.   DOXAZOSIN (CARDURA) 4 MG TABLET    Take 1 tablet (4 mg total) by mouth daily.   ENZALUTAMIDE (XTANDI) 40 MG CAPSULE    Take 120 mg by mouth daily.    GLUCOSE BLOOD (ONETOUCH VERIO) TEST STRIP    Check blood sugar one to two times a week. Dx:E11.29   LEUPROLIDE, 6 MONTH, (LEUPROLIDE ACETATE, 6 MONTH,) 45 MG INJECTION    Inject 45 mg into the skin every 6 (six) months. Filled by [facility] (ZESTORETIC) 20-12.5 MG TABLET    Take 1 tablet by mouth 2 (two) times daily.   METFORMIN (GLUCOPHAGE) 500 MG TABLET    Take one tablet by mouth once daily with breakfast to control blood sugar  METOPROLOL TARTRATE (LOPRESSOR) 50 MG TABLET    Take 1 tablet (50 mg total) by mouth 2 (two) times daily.   OMEGA-3 FATTY ACIDS (FISH OIL) 1000 MG CAPS    Take 1 capsule by mouth daily.   PYRIDOXINE (B-6) 100 MG TABLET    Take 100 mg by mouth daily.   SIMVASTATIN (ZOCOR) 20 MG TABLET    Take 1 tablet (20 mg total) by mouth daily.   ZOSTER VACCINE ADJUVANTED [facility]) INJECTION    Inject 0.5 mLs into the muscle once.  Modified Medications   No medications on file  Discontinued Medications   No medications on file     Physical Exam: unable due to tele-visit, unable to do virtual visit.     Labs reviewed: Basic Metabolic Panel: Recent Labs    04/12/18 0827  11/08/18 1007 11/21/18 0825 02/21/19 0824  NA 141   < > 136 140 139  K 4.3   < > 3.7 4.5 4.3  CL 109   < > 106 105 107  CO2 25   < > 20* 26 25  GLUCOSE 121*   < > 161* 126* 128*  BUN 27*   < > 17 25 25   CREATININE 1.31*   < > 1.05 1.21* 1.13*  CALCIUM 8.6    < > 8.7* 8.8 8.6  TSH 1.80  --   --   --   --    < > = values in this interval not displayed.   Liver Function Tests: Recent Labs    11/08/18 1007 11/21/18 0825 02/21/19 0824  AST 17 11 11   ALT 12 8* 8*  ALKPHOS 70  --   --   BILITOT 0.5 0.5 0.5  PROT 6.1* 5.9* 5.9*  ALBUMIN 3.1*  --   --    No results for input(s): LIPASE, AMYLASE in the last 8760 hours. No results for input(s): AMMONIA in the last 8760 hours. CBC: Recent Labs    04/12/18 0827 11/08/18 1007 11/21/18 0825  WBC 6.9 8.2 9.3  NEUTROABS 4,244 6.2 5,813  HGB 11.9* 12.3* 12.7*  HCT 34.1* 36.8* 37.2*  MCV 83.0 85.0 84.2  PLT 192 195 210   Lipid Panel: Recent Labs    07/19/18 0818 11/21/18 0825 02/21/19 0824  CHOL 162 186 160  HDL 41 43 45  LDLCALC 104* 119* 97  TRIG 78 125 88  CHOLHDL 4.0 4.3 3.6   TSH: Recent Labs    04/12/18 0827  TSH 1.80   A1C: Lab Results  Component Value Date   HGBA1C 5.6 02/21/2019     Assessment/Plan 1. Essential hypertension, benign Not controlled. Continues on metoprolol, lisinopril-hctz. -continue low sodium diet.  - amLODipine (NORVASC) 2.5 MG tablet; Take 1 tablet (2.5 mg total) by mouth daily.  Dispense: 30 tablet; Refill: 1  2. Bilateral edema of lower extremity Trace, encouraged elevating legs.  To use compression hose or socks during the day -low sodium diet.  To notify if Leg swelling worsens with adding norvasc  Next appt: 2 weeks for blood pressure follow up.  Carlos American. Harle Battiest  [facility] [phone]    Follow Up Instructions:    I discussed the assessment and treatment plan with the patient. The patient was provided an opportunity to ask questions and all were answered. The patient agreed with the plan and demonstrated an understanding of the instructions.   The patient was advised to call back or seek an in-person evaluation if the  symptoms worsen or if the condition fails to improve as anticipated.   I provided 13 minutes of non-face-to-face time during this encounter.  avs printed and mailed.  Lauree Chandler, NP

## 2019-03-16 NOTE — Patient Instructions (Addendum)
To add norvasc 2.5 mg by mouth daily to current regimen for blood pressure control.  Continue to take blood pressure AFTER you have had medication, make sure you are sitting for a few mins before you check your blood pressure.  To elevate your legs when sitting above level of heart to help with swelling Compression hose or socks Decrease sodium in diet.    DASH Eating Plan DASH stands for "Dietary Approaches to Stop Hypertension." The DASH eating plan is a healthy eating plan that has been shown to reduce high blood pressure (hypertension). It may also reduce your risk for type 2 diabetes, heart disease, and stroke. The DASH eating plan may also help with weight loss. What are tips for following this plan?  General guidelines  Avoid eating more than 2,300 mg (milligrams) of salt (sodium) a day. If you have hypertension, you may need to reduce your sodium intake to 1,500 mg a day.  Limit alcohol intake to no more than 1 drink a day for nonpregnant women and 2 drinks a day for men. One drink equals 12 oz of beer, 5 oz of wine, or 1 oz of hard liquor.  Work with your health care provider to maintain a healthy body weight or to lose weight. Ask what an ideal weight is for you.  Get at least 30 minutes of exercise that causes your heart to beat faster (aerobic exercise) most days of the week. Activities may include walking, swimming, or biking.  Work with your health care provider or diet and nutrition specialist (dietitian) to adjust your eating plan to your individual calorie needs. Reading food labels   Check food labels for the amount of sodium per serving. Choose foods with less than 5 percent of the Daily Value of sodium. Generally, foods with less than 300 mg of sodium per serving fit into this eating plan.  To find whole grains, look for the word "whole" as the first word in the ingredient list. Shopping  Buy products labeled as "low-sodium" or "no salt added."  Buy fresh foods.  Avoid canned foods and premade or frozen meals. Cooking  Avoid adding salt when cooking. Use salt-free seasonings or herbs instead of table salt or sea salt. Check with your health care provider or pharmacist before using salt substitutes.  Do not fry foods. Cook foods using healthy methods such as baking, boiling, grilling, and broiling instead.  Cook with heart-healthy oils, such as olive, canola, soybean, or sunflower oil. Meal planning  Eat a balanced diet that includes: ? 5 or more servings of fruits and vegetables each day. At each meal, try to fill half of your plate with fruits and vegetables. ? Up to 6-8 servings of whole grains each day. ? Less than 6 oz of lean meat, poultry, or fish each day. A 3-oz serving of meat is about the same size as a deck of cards. One egg equals 1 oz. ? 2 servings of low-fat dairy each day. ? A serving of nuts, seeds, or beans 5 times each week. ? Heart-healthy fats. Healthy fats called Omega-3 fatty acids are found in foods such as flaxseeds and coldwater fish, like sardines, salmon, and mackerel.  Limit how much you eat of the following: ? Canned or prepackaged foods. ? Food that is high in trans fat, such as fried foods. ? Food that is high in saturated fat, such as fatty meat. ? Sweets, desserts, sugary drinks, and other foods with added sugar. ? Full-fat dairy products.  Do not salt foods before eating.  Try to eat at least 2 vegetarian meals each week.  Eat more home-cooked food and less restaurant, buffet, and fast food.  When eating at a restaurant, ask that your food be prepared with less salt or no salt, if possible. What foods are recommended? The items listed may not be a complete list. Talk with your dietitian about what dietary choices are best for you. Grains Whole-grain or whole-wheat bread. Whole-grain or whole-wheat pasta. Brown rice. [place]. Bulgur. Whole-grain and low-sodium cereals. Pita bread. Low-fat, low-sodium  crackers. Whole-wheat flour tortillas. Vegetables Fresh or frozen vegetables (raw, steamed, roasted, or grilled). Low-sodium or reduced-sodium tomato and vegetable juice. Low-sodium or reduced-sodium tomato sauce and tomato paste. Low-sodium or reduced-sodium canned vegetables. Fruits All fresh, dried, or frozen fruit. Canned fruit in natural juice (without added sugar). Meat and other protein foods Skinless chicken or [place]. Ground chicken or [place]. Pork with fat trimmed off. Fish and seafood. Egg whites. Dried beans, peas, or lentils. Unsalted nuts, nut butters, and seeds. Unsalted canned beans. Lean cuts of beef with fat trimmed off. Low-sodium, lean deli meat. Dairy Low-fat (1%) or fat-free (skim) milk. Fat-free, low-fat, or reduced-fat cheeses. Nonfat, low-sodium ricotta or cottage cheese. Low-fat or nonfat yogurt. Low-fat, low-sodium cheese. Fats and oils Soft margarine without trans fats. Vegetable oil. Low-fat, reduced-fat, or light mayonnaise and salad dressings (reduced-sodium). Canola, safflower, olive, soybean, and sunflower oils. Avocado. Seasoning and other foods Herbs. Spices. Seasoning mixes without salt. Unsalted popcorn and pretzels. Fat-free sweets. What foods are not recommended? The items listed may not be a complete list. Talk with your dietitian about what dietary choices are best for you. Grains Baked goods made with fat, such as croissants, muffins, or some breads. Dry pasta or rice meal packs. Vegetables Creamed or fried vegetables. Vegetables in a cheese sauce. Regular canned vegetables (not low-sodium or reduced-sodium). Regular canned tomato sauce and paste (not low-sodium or reduced-sodium). Regular tomato and vegetable juice (not low-sodium or reduced-sodium). Angie Fava. Olives. Fruits Canned fruit in a light or heavy syrup. Fried fruit. Fruit in cream or butter sauce. Meat and other protein foods Fatty cuts of meat. Ribs. Fried meat. Berniece Salines. Sausage. Bologna and  other processed lunch meats. Salami. Fatback. Hotdogs. Bratwurst. Salted nuts and seeds. Canned beans with added salt. Canned or smoked fish. Whole eggs or egg yolks. Chicken or [place] with skin. Dairy Whole or 2% milk, cream, and half-and-half. Whole or full-fat cream cheese. Whole-fat or sweetened yogurt. Full-fat cheese. Nondairy creamers. Whipped toppings. Processed cheese and cheese spreads. Fats and oils Butter. Stick margarine. Lard. Shortening. Ghee. Bacon fat. Tropical oils, such as coconut, palm kernel, or palm oil. Seasoning and other foods Salted popcorn and pretzels. Onion salt, garlic salt, seasoned salt, table salt, and sea salt. Worcestershire sauce. Tartar sauce. Barbecue sauce. Teriyaki sauce. Soy sauce, including reduced-sodium. Steak sauce. Canned and packaged gravies. Fish sauce. Oyster sauce. Cocktail sauce. Horseradish that you find on the shelf. Ketchup. Mustard. Meat flavorings and tenderizers. Bouillon cubes. Hot sauce and Tabasco sauce. Premade or packaged marinades. Premade or packaged taco seasonings. Relishes. Regular salad dressings. Where to find more information:  National Heart, Lung, and [place]: https://wilson-eaton.com/  American Heart Association: www.heart.org Summary  The DASH eating plan is a healthy eating plan that has been shown to reduce high blood pressure (hypertension). It may also reduce your risk for type 2 diabetes, heart disease, and stroke.  With the DASH eating plan, you should limit salt (sodium)  intake to 2,300 mg a day. If you have hypertension, you may need to reduce your sodium intake to 1,500 mg a day.  When on the DASH eating plan, aim to eat more fresh fruits and vegetables, whole grains, lean proteins, low-fat dairy, and heart-healthy fats.  Work with your health care provider or diet and nutrition specialist (dietitian) to adjust your eating plan to your individual calorie needs. This information is not intended to replace advice  given to you by your health care provider. Make sure you discuss any questions you have with your health care provider. Document Released: 10/21/2011 Document Revised: 10/25/2016 Document Reviewed: 10/25/2016 Elsevier Interactive Patient Education  2019 [employer].

## 2019-03-20 DIAGNOSIS — N35919 Unspecified urethral stricture, male, unspecified site: Secondary | ICD-10-CM | POA: Diagnosis not present

## 2019-03-20 DIAGNOSIS — Z466 Encounter for fitting and adjustment of urinary device: Secondary | ICD-10-CM | POA: Diagnosis not present

## 2019-03-20 DIAGNOSIS — N320 Bladder-neck obstruction: Secondary | ICD-10-CM | POA: Diagnosis not present

## 2019-03-20 DIAGNOSIS — N99114 Postprocedural urethral stricture, male, unspecified: Secondary | ICD-10-CM | POA: Diagnosis not present

## 2019-03-20 DIAGNOSIS — N135 Crossing vessel and stricture of ureter without hydronephrosis: Secondary | ICD-10-CM | POA: Diagnosis not present

## 2019-03-20 DIAGNOSIS — Z8546 Personal history of malignant neoplasm of prostate: Secondary | ICD-10-CM | POA: Diagnosis not present

## 2019-03-20 DIAGNOSIS — E119 Type 2 diabetes mellitus without complications: Secondary | ICD-10-CM | POA: Diagnosis not present

## 2019-03-30 ENCOUNTER — Other Ambulatory Visit: Payer: Self-pay

## 2019-03-30 ENCOUNTER — Ambulatory Visit: Payer: Medicare Other | Admitting: Nurse Practitioner

## 2019-04-03 ENCOUNTER — Encounter: Payer: Self-pay | Admitting: Nurse Practitioner

## 2019-04-03 ENCOUNTER — Ambulatory Visit (INDEPENDENT_AMBULATORY_CARE_PROVIDER_SITE_OTHER): Payer: Medicare Other | Admitting: Nurse Practitioner

## 2019-04-03 ENCOUNTER — Other Ambulatory Visit: Payer: Self-pay

## 2019-04-03 VITALS — BP 112/68 | HR 61 | Temp 97.7°F | Ht 71.0 in | Wt 264.0 lb

## 2019-04-03 DIAGNOSIS — I10 Essential (primary) hypertension: Secondary | ICD-10-CM | POA: Diagnosis not present

## 2019-04-03 DIAGNOSIS — R600 Localized edema: Secondary | ICD-10-CM

## 2019-04-03 DIAGNOSIS — I420 Dilated cardiomyopathy: Secondary | ICD-10-CM | POA: Diagnosis not present

## 2019-04-03 NOTE — Patient Instructions (Signed)
Continue blood pressure medication- suspect your cuff is reading a little high. Good blood pressure today in office! Keep scheduled appts   DASH Eating Plan DASH stands for "Dietary Approaches to Stop Hypertension." The DASH eating plan is a healthy eating plan that has been shown to reduce high blood pressure (hypertension). It may also reduce your risk for type 2 diabetes, heart disease, and stroke. The DASH eating plan may also help with weight loss. What are tips for following this plan?  General guidelines  Avoid eating more than 2,300 mg (milligrams) of salt (sodium) a day. If you have hypertension, you may need to reduce your sodium intake to 1,500 mg a day.  Limit alcohol intake to no more than 1 drink a day for nonpregnant women and 2 drinks a day for men. One drink equals 12 oz of beer, 5 oz of wine, or 1 oz of hard liquor.  Work with your health care provider to maintain a healthy body weight or to lose weight. Ask what an ideal weight is for you.  Get at least 30 minutes of exercise that causes your heart to beat faster (aerobic exercise) most days of the week. Activities may include walking, swimming, or biking.  Work with your health care provider or diet and nutrition specialist (dietitian) to adjust your eating plan to your individual calorie needs. Reading food labels   Check food labels for the amount of sodium per serving. Choose foods with less than 5 percent of the Daily Value of sodium. Generally, foods with less than 300 mg of sodium per serving fit into this eating plan.  To find whole grains, look for the word "whole" as the first word in the ingredient list. Shopping  Buy products labeled as "low-sodium" or "no salt added."  Buy fresh foods. Avoid canned foods and premade or frozen meals. Cooking  Avoid adding salt when cooking. Use salt-free seasonings or herbs instead of table salt or sea salt. Check with your health care provider or pharmacist before  using salt substitutes.  Do not fry foods. Cook foods using healthy methods such as baking, boiling, grilling, and broiling instead.  Cook with heart-healthy oils, such as olive, canola, soybean, or sunflower oil. Meal planning  Eat a balanced diet that includes: ? 5 or more servings of fruits and vegetables each day. At each meal, try to fill half of your plate with fruits and vegetables. ? Up to 6-8 servings of whole grains each day. ? Less than 6 oz of lean meat, poultry, or fish each day. A 3-oz serving of meat is about the same size as a deck of cards. One egg equals 1 oz. ? 2 servings of low-fat dairy each day. ? A serving of nuts, seeds, or beans 5 times each week. ? Heart-healthy fats. Healthy fats called Omega-3 fatty acids are found in foods such as flaxseeds and coldwater fish, like sardines, salmon, and mackerel.  Limit how much you eat of the following: ? Canned or prepackaged foods. ? Food that is high in trans fat, such as fried foods. ? Food that is high in saturated fat, such as fatty meat. ? Sweets, desserts, sugary drinks, and other foods with added sugar. ? Full-fat dairy products.  Do not salt foods before eating.  Try to eat at least 2 vegetarian meals each week.  Eat more home-cooked food and less restaurant, buffet, and fast food.  When eating at a restaurant, ask that your food be prepared with less salt  or no salt, if possible. What foods are recommended? The items listed may not be a complete list. Talk with your dietitian about what dietary choices are best for you. Grains Whole-grain or whole-wheat bread. Whole-grain or whole-wheat pasta. Brown rice. [place]. Bulgur. Whole-grain and low-sodium cereals. Pita bread. Low-fat, low-sodium crackers. Whole-wheat flour tortillas. Vegetables Fresh or frozen vegetables (raw, steamed, roasted, or grilled). Low-sodium or reduced-sodium tomato and vegetable juice. Low-sodium or reduced-sodium tomato sauce and  tomato paste. Low-sodium or reduced-sodium canned vegetables. Fruits All fresh, dried, or frozen fruit. Canned fruit in natural juice (without added sugar). Meat and other protein foods Skinless chicken or [place]. Ground chicken or [place]. Pork with fat trimmed off. Fish and seafood. Egg whites. Dried beans, peas, or lentils. Unsalted nuts, nut butters, and seeds. Unsalted canned beans. Lean cuts of beef with fat trimmed off. Low-sodium, lean deli meat. Dairy Low-fat (1%) or fat-free (skim) milk. Fat-free, low-fat, or reduced-fat cheeses. Nonfat, low-sodium ricotta or cottage cheese. Low-fat or nonfat yogurt. Low-fat, low-sodium cheese. Fats and oils Soft margarine without trans fats. Vegetable oil. Low-fat, reduced-fat, or light mayonnaise and salad dressings (reduced-sodium). Canola, safflower, olive, soybean, and sunflower oils. Avocado. Seasoning and other foods Herbs. Spices. Seasoning mixes without salt. Unsalted popcorn and pretzels. Fat-free sweets. What foods are not recommended? The items listed may not be a complete list. Talk with your dietitian about what dietary choices are best for you. Grains Baked goods made with fat, such as croissants, muffins, or some breads. Dry pasta or rice meal packs. Vegetables Creamed or fried vegetables. Vegetables in a cheese sauce. Regular canned vegetables (not low-sodium or reduced-sodium). Regular canned tomato sauce and paste (not low-sodium or reduced-sodium). Regular tomato and vegetable juice (not low-sodium or reduced-sodium). Angie Fava. Olives. Fruits Canned fruit in a light or heavy syrup. Fried fruit. Fruit in cream or butter sauce. Meat and other protein foods Fatty cuts of meat. Ribs. Fried meat. Berniece Salines. Sausage. Bologna and other processed lunch meats. Salami. Fatback. Hotdogs. Bratwurst. Salted nuts and seeds. Canned beans with added salt. Canned or smoked fish. Whole eggs or egg yolks. Chicken or [place] with skin. Dairy Whole or 2%  milk, cream, and half-and-half. Whole or full-fat cream cheese. Whole-fat or sweetened yogurt. Full-fat cheese. Nondairy creamers. Whipped toppings. Processed cheese and cheese spreads. Fats and oils Butter. Stick margarine. Lard. Shortening. Ghee. Bacon fat. Tropical oils, such as coconut, palm kernel, or palm oil. Seasoning and other foods Salted popcorn and pretzels. Onion salt, garlic salt, seasoned salt, table salt, and sea salt. Worcestershire sauce. Tartar sauce. Barbecue sauce. Teriyaki sauce. Soy sauce, including reduced-sodium. Steak sauce. Canned and packaged gravies. Fish sauce. Oyster sauce. Cocktail sauce. Horseradish that you find on the shelf. Ketchup. Mustard. Meat flavorings and tenderizers. Bouillon cubes. Hot sauce and Tabasco sauce. Premade or packaged marinades. Premade or packaged taco seasonings. Relishes. Regular salad dressings. Where to find more information:  National Heart, Lung, and [place]: https://wilson-eaton.com/  American Heart Association: www.heart.org Summary  The DASH eating plan is a healthy eating plan that has been shown to reduce high blood pressure (hypertension). It may also reduce your risk for type 2 diabetes, heart disease, and stroke.  With the DASH eating plan, you should limit salt (sodium) intake to 2,300 mg a day. If you have hypertension, you may need to reduce your sodium intake to 1,500 mg a day.  When on the DASH eating plan, aim to eat more fresh fruits and vegetables, whole grains, lean proteins, low-fat dairy,  and heart-healthy fats.  Work with your health care provider or diet and nutrition specialist (dietitian) to adjust your eating plan to your individual calorie needs. This information is not intended to replace advice given to you by your health care provider. Make sure you discuss any questions you have with your health care provider. Document Released: 10/21/2011 Document Revised: 10/25/2016 Document Reviewed: 10/25/2016  Elsevier Interactive Patient Education  2019 [employer].

## 2019-04-03 NOTE — Progress Notes (Signed)
Careteam: Patient Care Team: Lauree Chandler, NP as PCP - General (Geriatric Medicine) Myrlene Broker, MD as Attending Physician (Urology) [name], MD as Consulting Physician (Ophthalmology) [name], MD as Consulting Physician (Ophthalmology)  Advanced Directive information Does Patient Have a Medical Advance Directive?: No, Does patient want to make changes to medical advance directive?: No - Patient declined  Allergies  Allergen Reactions  . Abiraterone Nausea And Vomiting and Other (See Comments)    Other reaction(s): Increased Heart Rate (intolerance)     Chief Complaint  Patient presents with  . Follow-up    B/P follow-up      HPI: Patient is a 85 y.o. male seen in the office today to follow up blood pressure.  Uses wrist cuff at home, blood pressure ranging from 127-160/50-78. No dizziness lightheadedness. No headaches.  Pt was previously on metoprolol, lisinopril-hctz and amlodipine 2.5 mg by mouth daily added. No increase in LE edema.   Review of Systems:  Review of Systems  Constitutional: Negative for chills, fever and weight loss.  HENT: Negative for tinnitus.   Respiratory: Negative for cough, sputum production and shortness of breath.   Cardiovascular: Positive for leg swelling (minimal). Negative for chest pain and palpitations.  Gastrointestinal: Negative for abdominal pain, constipation, diarrhea and heartburn.  Genitourinary: Positive for frequency. Negative for dysuria and urgency.  Musculoskeletal: Positive for back pain (occasionally). Negative for falls, joint pain and myalgias.  Skin: Negative.   Neurological: Negative for dizziness, weakness and headaches.    Past Medical History:  Diagnosis Date  . Acute bronchitis 09/12/2012  . 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 Bone metastases ([place])    left mid tibial shaft  . Cataract    Dr.Groat  . Cellulitis and abscess of leg, except foot 01/17/2012  . Chest pain, unspecified  04/28/2004  . Closed fracture of five ribs 03/24/2004  . First degree atrioventricular block 04/25/2007  . Gross hematuria 09/06/2011  . History of radiation therapy 04/03/14- 04/17/14   mid to distal left tibia 3000 cGy in 10 sessions  . HTN (hypertension)   . Hx of radiation therapy 11/1998   prostate fossa - 6040 cGy, 33 fx, Dr Danny Lawless  . Osteoarthrosis involving, or with mention of more than one site, but not specified as generalized, multiple sites 10/22/2010  . Other abnormal blood chemistry 04/29/1991  . Other and unspecified hyperlipidemia 01/02/2013  . Palpitations 04/28/2004  . Prostate cancer ([place]) 12/16/1994   gleason 7  . Reflux esophagitis 05/10/2008  . Routine general medical examination at a health care facility   . Spinal stenosis, unspecified region other than cervical 04/29/1995  . Type II or unspecified type diabetes mellitus without mention of complication, uncontrolled    Past Surgical History:  Procedure Laterality Date  . AIR/FLUID EXCHANGE Left 01/09/2016   Procedure: AIR/FLUID EXCHANGE;  Surgeon: Jalene Mullet, MD;  Location: [facility];  Service: Ophthalmology;  Laterality: Left;  . CYSTOSCOPY W/ URETERAL STENT PLACEMENT  12/31/14  . CYSTOSCOPY W/ URETERAL STENT PLACEMENT  05/16/2015  . EYE SURGERY  2006   cataract, Dr Shanon Rosser  . LASER PHOTO ABLATION Left 01/09/2016   Procedure: LASER PHOTO ABLATION;  Surgeon: Jalene Mullet, MD;  Location: [facility];  Service: Ophthalmology;  Laterality: Left;  Endolaser  . Left ureteral stent placement  Week of 12/05/11  . PARS PLANA VITRECTOMY Left 01/09/2016   Procedure: PARS PLANA VITRECTOMY WITH 25 GAUGE LEFT EYE ;  Surgeon: Jalene Mullet, MD;  Location: [facility];  Service: Ophthalmology;  Laterality: Left;  . PERFLUORONE INJECTION Left 01/09/2016   Procedure: PERFLUORONE INJECTION;  Surgeon: Jalene Mullet, MD;  Location: [facility];  Service: Ophthalmology;  Laterality: Left;  . PROSTATECTOMY  1996   Dr. Rosana Hoes   Social History:   reports  that he quit smoking about 50 years ago. His smoking use included cigars. He quit after 5.00 years of use. He has never used smokeless tobacco. He reports that he does not drink alcohol or use drugs.  Family History  Problem Relation Age of Onset  . Heart disease Father   . Stroke Sister     Medications: Patient's Medications  New Prescriptions   No medications on file  Previous Medications   ACETAMINOPHEN (TYLENOL) 650 MG CR TABLET    Take 650 mg by mouth as needed for pain.   AMLODIPINE (NORVASC) 2.5 MG TABLET    Take 1 tablet (2.5 mg total) by mouth daily.   ASPIRIN 81 MG TABLET    Take 1 tablet (81 mg total) by mouth daily.   CALCIUM CARBONATE (TUMS - DOSED IN MG ELEMENTAL CALCIUM) 500 MG CHEWABLE TABLET    Chew 1 tablet by mouth as needed for indigestion or heartburn.   CHOLECALCIFEROL 1000 UNITS TABLET    Take 1,000 Units by mouth daily.   DOXAZOSIN (CARDURA) 4 MG TABLET    Take 1 tablet (4 mg total) by mouth daily.   ENZALUTAMIDE (XTANDI) 40 MG CAPSULE    Take 120 mg by mouth daily.    GLUCOSE BLOOD (ONETOUCH VERIO) TEST STRIP    Check blood sugar one to two times a week. Dx:E11.29   LEUPROLIDE, 6 MONTH, (LEUPROLIDE ACETATE, 6 MONTH,) 45 MG INJECTION    Inject 45 mg into the skin every 6 (six) months. Filled by [facility] (ZESTORETIC) 20-12.5 MG TABLET    Take 1 tablet by mouth 2 (two) times daily.   METFORMIN (GLUCOPHAGE) 500 MG TABLET    Take one tablet by mouth once daily with breakfast to control blood sugar   METOPROLOL TARTRATE (LOPRESSOR) 50 MG TABLET    Take 1 tablet (50 mg total) by mouth 2 (two) times daily.   OMEGA-3 FATTY ACIDS (FISH OIL) 1000 MG CAPS    Take 1 capsule by mouth daily.   PYRIDOXINE (B-6) 100 MG TABLET    Take 100 mg by mouth daily.   SIMVASTATIN (ZOCOR) 20 MG TABLET    Take 1 tablet (20 mg total) by mouth daily.   ZOSTER VACCINE ADJUVANTED [facility]) INJECTION    Inject 0.5 mLs into the muscle once.  Modified  Medications   No medications on file  Discontinued Medications   No medications on file     Physical Exam:  Vitals:   04/03/19 0809  BP: 112/68  Pulse: 61  Temp: 97.7 F (36.5 C)  TempSrc: Oral  SpO2: 98%  Weight: 264 lb (119.7 kg)  Height: 5\' 11"  (1.803 m)   Body mass index is 36.82 kg/m.  Physical Exam Constitutional:      Appearance: Normal appearance. He is obese.  HENT:     Head: Normocephalic.     Mouth/Throat:     Mouth: Mucous membranes are moist.     Pharynx: Oropharynx is clear.  Eyes:     General:        Left eye: No discharge.     Comments: +Left upper eyelid minimally swollen  Neck:     Musculoskeletal: Normal range of motion.  Cardiovascular:     Rate and Rhythm: Normal rate and regular rhythm.     Heart sounds: Normal heart sounds.  Pulmonary:     Effort: Pulmonary effort is normal.     Breath sounds: Normal breath sounds.  Abdominal:     General: Bowel sounds are normal.     Palpations: Abdomen is soft.  Musculoskeletal:     Right lower leg: Edema present.     Left lower leg: Edema present.     Comments: Trace edema to bilateral LE  Skin:    Findings: No rash.  Neurological:     Mental Status: He is alert.     Labs reviewed: Basic Metabolic Panel: Recent Labs    04/12/18 0827  11/08/18 1007 11/21/18 0825 02/21/19 0824  NA 141   < > 136 140 139  K 4.3   < > 3.7 4.5 4.3  CL 109   < > 106 105 107  CO2 25   < > 20* 26 25  GLUCOSE 121*   < > 161* 126* 128*  BUN 27*   < > 17 25 25   CREATININE 1.31*   < > 1.05 1.21* 1.13*  CALCIUM 8.6   < > 8.7* 8.8 8.6  TSH 1.80  --   --   --   --    < > = values in this interval not displayed.   Liver Function Tests: Recent Labs    11/08/18 1007 11/21/18 0825 02/21/19 0824  AST 17 11 11   ALT 12 8* 8*  ALKPHOS 70  --   --   BILITOT 0.5 0.5 0.5  PROT 6.1* 5.9* 5.9*  ALBUMIN 3.1*  --   --    No results for input(s): LIPASE, AMYLASE in the last 8760 hours. No results for input(s):  AMMONIA in the last 8760 hours. CBC: Recent Labs    04/12/18 0827 11/08/18 1007 11/21/18 0825  WBC 6.9 8.2 9.3  NEUTROABS 4,244 6.2 5,813  HGB 11.9* 12.3* 12.7*  HCT 34.1* 36.8* 37.2*  MCV 83.0 85.0 84.2  PLT 192 195 210   Lipid Panel: Recent Labs    07/19/18 0818 11/21/18 0825 02/21/19 0824  CHOL 162 186 160  HDL 41 43 45  LDLCALC 104* 119* 97  TRIG 78 125 88  CHOLHDL 4.0 4.3 3.6   TSH: Recent Labs    04/12/18 0827  TSH 1.80   A1C: Lab Results  Component Value Date   HGBA1C 5.6 02/21/2019     Assessment/Plan 1. Bilateral edema of lower extremity Trace and stable. No increase with addition of norvasc  2. Congestive dilated cardiomyopathy ([place]) -has scheduled follow up with cardiologist. No worsening of shortness of breath, chest pains, edema.  3. Essential hypertension, benign Blood pressures reviewed. Blood pressure in office with excellent control. Suspect blood pressures at home via home cuff is slightly elevated. To continue current regimen with low sodium diet.  Next appt: 06/20/2019 Carlos American. [place], [facility] [phone]

## 2019-04-08 ENCOUNTER — Other Ambulatory Visit: Payer: Self-pay | Admitting: Nurse Practitioner

## 2019-04-08 DIAGNOSIS — I517 Cardiomegaly: Secondary | ICD-10-CM

## 2019-04-08 DIAGNOSIS — I10 Essential (primary) hypertension: Secondary | ICD-10-CM

## 2019-04-10 ENCOUNTER — Other Ambulatory Visit: Payer: Self-pay | Admitting: *Deleted

## 2019-04-10 DIAGNOSIS — I517 Cardiomegaly: Secondary | ICD-10-CM

## 2019-04-10 DIAGNOSIS — I10 Essential (primary) hypertension: Secondary | ICD-10-CM

## 2019-04-10 MED ORDER — METOPROLOL TARTRATE 50 MG PO TABS: 50.0000 mg | ORAL_TABLET | Freq: Two times a day (BID) | ORAL | 1 refills | Status: DC

## 2019-04-10 NOTE — Telephone Encounter (Signed)
Patient requested refill

## 2019-04-11 ENCOUNTER — Other Ambulatory Visit: Payer: Self-pay | Admitting: Nurse Practitioner

## 2019-04-11 DIAGNOSIS — I517 Cardiomegaly: Secondary | ICD-10-CM

## 2019-04-11 DIAGNOSIS — I10 Essential (primary) hypertension: Secondary | ICD-10-CM

## 2019-04-17 ENCOUNTER — Telehealth: Payer: Self-pay | Admitting: *Deleted

## 2019-04-17 NOTE — Telephone Encounter (Signed)
Received Handicap placard. Filled out and placed in [facility] to review and sign. Patient would like it mailed back to him once completed. Attached is a self addressed/stamped envelope.

## 2019-05-09 ENCOUNTER — Ambulatory Visit (INDEPENDENT_AMBULATORY_CARE_PROVIDER_SITE_OTHER): Payer: Medicare Other | Admitting: Nurse Practitioner

## 2019-05-09 ENCOUNTER — Other Ambulatory Visit: Payer: Self-pay

## 2019-05-09 ENCOUNTER — Encounter: Payer: Self-pay | Admitting: Nurse Practitioner

## 2019-05-09 VITALS — BP 122/60 | HR 75 | Temp 98.9°F | Ht 71.0 in | Wt 271.0 lb

## 2019-05-09 DIAGNOSIS — M79645 Pain in left finger(s): Secondary | ICD-10-CM | POA: Diagnosis not present

## 2019-05-09 MED ORDER — PREDNISONE 20 MG PO TABS: 20.0000 mg | ORAL_TABLET | Freq: Two times a day (BID) | ORAL | 0 refills | Status: DC

## 2019-05-09 NOTE — Patient Instructions (Signed)
To use ice to thumb- 2-3 times daily To take tylenol 325 2 tablets every 6 hours as needed for pain Take prednisone twice daily with food for 3 days To get lab work.   Notify if symptoms fail to improve or get worse Fever, swelling, discoloration, increase in pain occurs

## 2019-05-09 NOTE — Progress Notes (Signed)
Careteam: Patient Care Team: Lauree Chandler, NP as PCP - General (Geriatric Medicine) Myrlene Broker, MD as Attending Physician (Urology) [name], MD as Consulting Physician (Ophthalmology) [name], MD as Consulting Physician (Ophthalmology)  Advanced Directive information    Allergies  Allergen Reactions  . Abiraterone Nausea And Vomiting and Other (See Comments)    Other reaction(s): Increased Heart Rate (intolerance)     Chief Complaint  Patient presents with  . Acute Visit    Left hand pain onset last night, no known injury      HPI: Patient is a 85 y.o. male seen in the office today due to left hand pain. Reports having most of the pain in the base of the thumb on the left hand. Last night pain started. Progressively got worse. No injury. Does not felt like he got stung. Very comfortable and cant get comfortable. Took tylenol 2 tablets that did not help much. 9/10.  Put benegay on area. Did not help.   Review of Systems:  Review of Systems  Constitutional: Negative for chills and fever.  Respiratory: Negative for cough and shortness of breath.   Cardiovascular: Negative for chest pain.  Musculoskeletal: Positive for joint pain and myalgias.  Skin: Negative for itching and rash.    Past Medical History:  Diagnosis Date  . Acute bronchitis 09/12/2012  . 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 Bone metastases ([place])    left mid tibial shaft  . Cataract    Dr.Groat  . Cellulitis and abscess of leg, except foot 01/17/2012  . Chest pain, unspecified 04/28/2004  . Closed fracture of five ribs 03/24/2004  . First degree atrioventricular block 04/25/2007  . Gross hematuria 09/06/2011  . History of radiation therapy 04/03/14- 04/17/14   mid to distal left tibia 3000 cGy in 10 sessions  . HTN (hypertension)   . Hx of radiation therapy 11/1998   prostate fossa - 6040 cGy, 33 fx, Dr Danny Lawless  . Osteoarthrosis involving, or with mention of more than one  site, but not specified as generalized, multiple sites 10/22/2010  . Other abnormal blood chemistry 04/29/1991  . Other and unspecified hyperlipidemia 01/02/2013  . Palpitations 04/28/2004  . Prostate cancer ([place]) 12/16/1994   gleason 7  . Reflux esophagitis 05/10/2008  . Routine general medical examination at a health care facility   . Spinal stenosis, unspecified region other than cervical 04/29/1995  . Type II or unspecified type diabetes mellitus without mention of complication, uncontrolled    Past Surgical History:  Procedure Laterality Date  . AIR/FLUID EXCHANGE Left 01/09/2016   Procedure: AIR/FLUID EXCHANGE;  Surgeon: Jalene Mullet, MD;  Location: [place];  Service: Ophthalmology;  Laterality: Left;  . CYSTOSCOPY W/ URETERAL STENT PLACEMENT  12/31/14  . CYSTOSCOPY W/ URETERAL STENT PLACEMENT  05/16/2015  . EYE SURGERY  2006   cataract, Dr Shanon Rosser  . LASER PHOTO ABLATION Left 01/09/2016   Procedure: LASER PHOTO ABLATION;  Surgeon: Jalene Mullet, MD;  Location: [place];  Service: Ophthalmology;  Laterality: Left;  Endolaser  . Left ureteral stent placement  Week of 12/05/11  . PARS PLANA VITRECTOMY Left 01/09/2016   Procedure: PARS PLANA VITRECTOMY WITH 25 GAUGE LEFT EYE ;  Surgeon: Jalene Mullet, MD;  Location: [place];  Service: Ophthalmology;  Laterality: Left;  . PERFLUORONE INJECTION Left 01/09/2016   Procedure: PERFLUORONE INJECTION;  Surgeon: Jalene Mullet, MD;  Location: [place];  Service: Ophthalmology;  Laterality: Left;  . PROSTATECTOMY  1996   Dr. Rosana Hoes  Social History:   reports that he quit smoking about 50 years ago. His smoking use included cigars. He quit after 5.00 years of use. He has never used smokeless tobacco. He reports that he does not drink alcohol or use drugs.  Family History  Problem Relation Age of Onset  . Heart disease Father   . Stroke Sister     Medications: Patient's Medications  New Prescriptions   No medications on file  Previous Medications    ACETAMINOPHEN (TYLENOL) 650 MG CR TABLET    Take 650 mg by mouth as needed for pain.   AMLODIPINE (NORVASC) 2.5 MG TABLET    Take 1 tablet (2.5 mg total) by mouth daily.   ASPIRIN 81 MG TABLET    Take 1 tablet (81 mg total) by mouth daily.   CALCIUM CARBONATE (TUMS - DOSED IN MG ELEMENTAL CALCIUM) 500 MG CHEWABLE TABLET    Chew 1 tablet by mouth as needed for indigestion or heartburn.   CHOLECALCIFEROL 1000 UNITS TABLET    Take 1,000 Units by mouth daily.   DOXAZOSIN (CARDURA) 4 MG TABLET    Take 1 tablet (4 mg total) by mouth daily.   ENZALUTAMIDE (XTANDI) 40 MG CAPSULE    Take 120 mg by mouth daily.    GLUCOSE BLOOD (ONETOUCH VERIO) TEST STRIP    Check blood sugar one to two times a week. Dx:E11.29   LEUPROLIDE, 6 MONTH, (LEUPROLIDE ACETATE, 6 MONTH,) 45 MG INJECTION    Inject 45 mg into the skin every 6 (six) months. Filled by [place] (ZESTORETIC) 20-12.5 MG TABLET    Take 1 tablet by mouth 2 (two) times daily.   METFORMIN (GLUCOPHAGE) 500 MG TABLET    Take one tablet by mouth once daily with breakfast to control blood sugar   METOPROLOL TARTRATE (LOPRESSOR) 50 MG TABLET    Take 1 tablet (50 mg total) by mouth 2 (two) times daily.   OMEGA-3 FATTY ACIDS (FISH OIL) 1000 MG CAPS    Take 1 capsule by mouth daily.   PYRIDOXINE (B-6) 100 MG TABLET    Take 100 mg by mouth daily.   SIMVASTATIN (ZOCOR) 20 MG TABLET    Take 1 tablet (20 mg total) by mouth daily.   ZOSTER VACCINE ADJUVANTED [facility]) INJECTION    Inject 0.5 mLs into the muscle once.  Modified Medications   No medications on file  Discontinued Medications   No medications on file    Physical Exam:  Vitals:   05/09/19 1514  BP: 122/60  Pulse: 75  Temp: 98.9 F (37.2 C)  TempSrc: Oral  SpO2: 96%  Weight: 271 lb (122.9 kg)  Height: 5\' 11"  (1.803 m)   Body mass index is 37.8 kg/m. Wt Readings from Last 3 Encounters:  05/09/19 271 lb (122.9 kg)  04/03/19 264 lb (119.7 kg)  11/24/18  261 lb 12.8 oz (118.8 kg)    Physical Exam Constitutional:      Appearance: Normal appearance.  Cardiovascular:     Rate and Rhythm: Normal rate and regular rhythm.  Pulmonary:     Effort: Pulmonary effort is normal.     Breath sounds: Normal breath sounds.  Musculoskeletal:       Hands:     Comments: Tenderness noted to left thumb MCP joint. Slight swelling and warmth noted. Decreased ROM due to pain.   Skin:    General: Skin is warm and dry.  Neurological:     Mental Status: He is alert.  Labs reviewed: Basic Metabolic Panel: Recent Labs    11/08/18 1007 11/21/18 0825 02/21/19 0824  NA 136 140 139  K 3.7 4.5 4.3  CL 106 105 107  CO2 20* 26 25  GLUCOSE 161* 126* 128*  BUN 17 25 25   CREATININE 1.05 1.21* 1.13*  CALCIUM 8.7* 8.8 8.6   Liver Function Tests: Recent Labs    11/08/18 1007 11/21/18 0825 02/21/19 0824  AST 17 11 11   ALT 12 8* 8*  ALKPHOS 70  --   --   BILITOT 0.5 0.5 0.5  PROT 6.1* 5.9* 5.9*  ALBUMIN 3.1*  --   --    No results for input(s): LIPASE, AMYLASE in the last 8760 hours. No results for input(s): AMMONIA in the last 8760 hours. CBC: Recent Labs    11/08/18 1007 11/21/18 0825  WBC 8.2 9.3  NEUTROABS 6.2 5,813  HGB 12.3* 12.7*  HCT 36.8* 37.2*  MCV 85.0 84.2  PLT 195 210   Lipid Panel: Recent Labs    07/19/18 0818 11/21/18 0825 02/21/19 0824  CHOL 162 186 160  HDL 41 43 45  LDLCALC 104* 119* 97  TRIG 78 125 88  CHOLHDL 4.0 4.3 3.6   TSH: No results for input(s): TSH in the last 8760 hours. A1C: Lab Results  Component Value Date   HGBA1C 5.6 02/21/2019     Assessment/Plan 1. Pain of left thumb -he does work outside daily, ?overuse of joint/OA  vs gout -to use ice as needed - predniSONE (DELTASONE) 20 MG tablet; Take 1 tablet (20 mg total) by mouth 2 (two) times daily with a meal.  Dispense: 6 tablet; Refill: 0 - Uric acid - CBC with Differential/Platelet  To notify if redness, heat, swelling or pain  worsens. Carlos American. [place], [facility] [phone]

## 2019-05-10 ENCOUNTER — Encounter: Payer: Self-pay | Admitting: Nurse Practitioner

## 2019-05-10 ENCOUNTER — Ambulatory Visit (INDEPENDENT_AMBULATORY_CARE_PROVIDER_SITE_OTHER): Payer: Medicare Other | Admitting: Nurse Practitioner

## 2019-05-10 ENCOUNTER — Other Ambulatory Visit: Payer: Self-pay

## 2019-05-10 DIAGNOSIS — M109 Gout, unspecified: Secondary | ICD-10-CM | POA: Diagnosis not present

## 2019-05-10 LAB — CBC WITH DIFFERENTIAL/PLATELET
Absolute Monocytes: 845 cells/uL (ref 200–950)
Basophils Absolute: 51 cells/uL (ref 0–200)
Basophils Relative: 0.4 %
Eosinophils Absolute: 77 cells/uL (ref 15–500)
Eosinophils Relative: 0.6 %
HCT: 34.5 % — ABNORMAL LOW (ref 38.5–50.0)
Hemoglobin: 12.1 g/dL — ABNORMAL LOW (ref 13.2–17.1)
Lymphs Abs: 2854 cells/uL (ref 850–3900)
MCH: 29.2 pg (ref 27.0–33.0)
MCHC: 35.1 g/dL (ref 32.0–36.0)
MCV: 83.3 fL (ref 80.0–100.0)
MPV: 9.6 fL (ref 7.5–12.5)
Monocytes Relative: 6.6 %
Neutro Abs: 8973 cells/uL — ABNORMAL HIGH (ref 1500–7800)
Neutrophils Relative %: 70.1 %
Platelets: 195 10*3/uL (ref 140–400)
RBC: 4.14 10*6/uL — ABNORMAL LOW (ref 4.20–5.80)
RDW: 14.8 % (ref 11.0–15.0)
Total Lymphocyte: 22.3 %
WBC: 12.8 10*3/uL — ABNORMAL HIGH (ref 3.8–10.8)

## 2019-05-10 LAB — URIC ACID: Uric Acid, Serum: 8.9 mg/dL — ABNORMAL HIGH (ref 4.0–8.0)

## 2019-05-10 MED ORDER — ALLOPURINOL 100 MG PO TABS: 100.0000 mg | ORAL_TABLET | Freq: Every day | ORAL | 6 refills | Status: DC

## 2019-05-10 MED ORDER — PREDNISONE 20 MG PO TABS: 20.0000 mg | ORAL_TABLET | Freq: Every day | ORAL | 0 refills | Status: DC

## 2019-05-10 NOTE — Patient Instructions (Signed)
Continue prednisone 20 mg by mouth twice daily for 3 days then start prednisone 20 mg daily until complete Start allopurinol 100 mg by mouth daily- this is to bring uric acid level down We will follow up uric acid level with next lab draw.    Low-Purine Eating Plan A low-purine eating plan involves making food choices to limit your intake of purine. Purine is a kind of uric acid. Too much uric acid in your blood can cause certain conditions, such as gout and kidney stones. Eating a low-purine diet can help control these conditions. What are tips for following this plan? Reading food labels   Avoid foods with saturated or Trans fat.  Check the ingredient list of grains-based foods, such as bread and cereal, to make sure that they contain whole grains.  Check the ingredient list of sauces or soups to make sure they do not contain meat or fish.  When choosing soft drinks, check the ingredient list to make sure they do not contain high-fructose corn syrup. Shopping  Buy plenty of fresh fruits and vegetables.  Avoid buying canned or fresh fish.  Buy dairy products labeled as low-fat or nonfat.  Avoid buying premade or processed foods. These foods are often high in fat, salt (sodium), and added sugar. Cooking  Use olive oil instead of butter when cooking. Oils like olive oil, canola oil, and sunflower oil contain healthy fats. Meal planning  Learn which foods do or do not affect you. If you find out that a food tends to cause your gout symptoms to flare up, avoid eating that food. You can enjoy foods that do not cause problems. If you have any questions about a food item, talk with your dietitian or health care provider.  Limit foods high in fat, especially saturated fat. Fat makes it harder for your body to get rid of uric acid.  Choose foods that are lower in fat and are lean sources of protein. General guidelines  Limit alcohol intake to no more than 1 drink a day for  nonpregnant women and 2 drinks a day for men. One drink equals 12 oz of beer, 5 oz of wine, or 1 oz of hard liquor. Alcohol can affect the way your body gets rid of uric acid.  Drink plenty of water to keep your urine clear or pale yellow. Fluids can help remove uric acid from your body.  If directed by your health care provider, take a vitamin C supplement.  Work with your health care provider and dietitian to develop a plan to achieve or maintain a healthy weight. Losing weight can help reduce uric acid in your blood. What foods are recommended? The items listed may not be a complete list. Talk with your dietitian about what dietary choices are best for you. Foods low in purines Foods low in purines do not need to be limited. These include:  All fruits.  All low-purine vegetables, pickles, and olives.  Breads, pasta, rice, cornbread, and popcorn. Cake and other baked goods.  All dairy foods.  Eggs, nuts, and nut butters.  Spices and condiments, such as salt, herbs, and vinegar.  Plant oils, butter, and margarine.  Water, sugar-free soft drinks, tea, coffee, and cocoa.  Vegetable-based soups, broths, sauces, and gravies. Foods moderate in purines Foods moderate in purines should be limited to the amounts listed.   cup of asparagus, cauliflower, spinach, mushrooms, or green peas, each day.  2/3 cup uncooked oatmeal, each day.   cup dry wheat  bran or wheat germ, each day.  2-3 ounces of meat or poultry, each day.  4-6 ounces of shellfish, such as crab, lobster, oysters, or shrimp, each day.  1 cup cooked beans, peas, or lentils, each day.  Soup, broths, or bouillon made from meat or fish. Limit these foods as much as possible. What foods are not recommended? The items listed may not be a complete list. Talk with your dietitian about what dietary choices are best for you. Limit your intake of foods high in purines, including:  Beer and other alcohol.  Meat-based  gravy or sauce.  Canned or fresh fish, such as: ? Anchovies, sardines, herring, and tuna. ? Mussels and scallops. ? Codfish, trout, and haddock.  Jeremiah Collins.  Organ meats, such as: ? Liver or kidney. ? Tripe. ? Sweetbreads (thymus gland or pancreas).  [employer] biochemist.  Yeast or yeast extract supplements.  Drinks sweetened with high-fructose corn syrup. Summary  Eating a low-purine diet can help control conditions caused by too much uric acid in the body, such as gout or kidney stones.  Choose low-purine foods, limit alcohol, and limit foods high in fat.  You will learn over time which foods do or do not affect you. If you find out that a food tends to cause your gout symptoms to flare up, avoid eating that food. This information is not intended to replace advice given to you by your health care provider. Make sure you discuss any questions you have with your health care provider. Document Released: 02/26/2011 Document Revised: 12/15/2016 Document Reviewed: 12/15/2016 Elsevier Interactive Patient Education  2019 [employer].

## 2019-05-10 NOTE — Progress Notes (Signed)
This service is provided via telemedicine  No vital signs collected/recorded due to the encounter was a telemedicine visit.   Location of patient (ex: home, work):Home  Patient consents to a telephone visit:  Yes  Location of the provider (ex: office, home):[facility], Office   Name of any referring provider:  N/A  Names of all persons participating in the telemedicine service and their role in the encounter:  S.Chrae B/CMA, Sherrie Mustache, NP, and Patient   Time spent on call: 4 min with medical assistant      Careteam: Patient Care Team: Lauree Chandler, NP as PCP - General (Geriatric Medicine) Myrlene Broker, MD as Attending Physician (Urology) [name], MD as Consulting Physician (Ophthalmology) [name], MD as Consulting Physician (Ophthalmology)  Advanced Directive information    Allergies  Allergen Reactions  . Abiraterone Nausea And Vomiting and Other (See Comments)    Other reaction(s): Increased Heart Rate (intolerance)     Chief Complaint  Patient presents with  . Follow-up    Follow-up on recent labs. Telephone visit      HPI: Patient is a 85 y.o. male to follow up pain and labs Reports thumb is still sore but feeling a lot better. Has taken 2 doses of prednisone.  Thumb was throbbing yesterday but improved after taking 1 dose of prednisone and putting ice on it. No fever or chills.   Review of Systems:  Review of Systems  Constitutional: Negative for chills, fever and malaise/fatigue.  Respiratory: Negative for cough and shortness of breath.   Cardiovascular: Negative for chest pain.  Musculoskeletal: Positive for joint pain and myalgias.       Improved pain  Skin: Negative for itching and rash.   Past Medical History:  Diagnosis Date  . Acute bronchitis 09/12/2012  . 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 Bone metastases ([place])    left mid tibial shaft  . Cataract    Dr.Groat  . Cellulitis and abscess of leg,  except foot 01/17/2012  . Chest pain, unspecified 04/28/2004  . Closed fracture of five ribs 03/24/2004  . First degree atrioventricular block 04/25/2007  . Gross hematuria 09/06/2011  . History of radiation therapy 04/03/14- 04/17/14   mid to distal left tibia 3000 cGy in 10 sessions  . HTN (hypertension)   . Hx of radiation therapy 11/1998   prostate fossa - 6040 cGy, 33 fx, Dr Danny Lawless  . Osteoarthrosis involving, or with mention of more than one site, but not specified as generalized, multiple sites 10/22/2010  . Other abnormal blood chemistry 04/29/1991  . Other and unspecified hyperlipidemia 01/02/2013  . Palpitations 04/28/2004  . Prostate cancer ([place]) 12/16/1994   gleason 7  . Reflux esophagitis 05/10/2008  . Routine general medical examination at a health care facility   . Spinal stenosis, unspecified region other than cervical 04/29/1995  . Type II or unspecified type diabetes mellitus without mention of complication, uncontrolled    Past Surgical History:  Procedure Laterality Date  . AIR/FLUID EXCHANGE Left 01/09/2016   Procedure: AIR/FLUID EXCHANGE;  Surgeon: Jalene Mullet, MD;  Location: [place];  Service: Ophthalmology;  Laterality: Left;  . CYSTOSCOPY W/ URETERAL STENT PLACEMENT  12/31/14  . CYSTOSCOPY W/ URETERAL STENT PLACEMENT  05/16/2015  . EYE SURGERY  2006   cataract, Dr Shanon Rosser  . LASER PHOTO ABLATION Left 01/09/2016   Procedure: LASER PHOTO ABLATION;  Surgeon: Jalene Mullet, MD;  Location: [place];  Service: Ophthalmology;  Laterality: Left;  Endolaser  .  Left ureteral stent placement  Week of 12/05/11  . PARS PLANA VITRECTOMY Left 01/09/2016   Procedure: PARS PLANA VITRECTOMY WITH 25 GAUGE LEFT EYE ;  Surgeon: Jalene Mullet, MD;  Location: [place];  Service: Ophthalmology;  Laterality: Left;  . PERFLUORONE INJECTION Left 01/09/2016   Procedure: PERFLUORONE INJECTION;  Surgeon: Jalene Mullet, MD;  Location: [place];  Service: Ophthalmology;  Laterality: Left;  . PROSTATECTOMY   1996   Dr. Rosana Hoes   Social History:   reports that he quit smoking about 50 years ago. His smoking use included cigars. He quit after 5.00 years of use. He has never used smokeless tobacco. He reports that he does not drink alcohol or use drugs.  Family History  Problem Relation Age of Onset  . Heart disease Father   . Stroke Sister     Medications: Patient's Medications  New Prescriptions   No medications on file  Previous Medications   ACETAMINOPHEN (TYLENOL) 650 MG CR TABLET    Take 650 mg by mouth as needed for pain.   AMLODIPINE (NORVASC) 2.5 MG TABLET    Take 1 tablet (2.5 mg total) by mouth daily.   ASPIRIN 81 MG TABLET    Take 1 tablet (81 mg total) by mouth daily.   CALCIUM CARBONATE (TUMS - DOSED IN MG ELEMENTAL CALCIUM) 500 MG CHEWABLE TABLET    Chew 1 tablet by mouth as needed for indigestion or heartburn.   CHOLECALCIFEROL 1000 UNITS TABLET    Take 1,000 Units by mouth daily.   DOXAZOSIN (CARDURA) 4 MG TABLET    Take 1 tablet (4 mg total) by mouth daily.   ENZALUTAMIDE (XTANDI) 40 MG CAPSULE    Take 120 mg by mouth daily.    GLUCOSE BLOOD (ONETOUCH VERIO) TEST STRIP    Check blood sugar one to two times a week. Dx:E11.29   LEUPROLIDE, 6 MONTH, (LEUPROLIDE ACETATE, 6 MONTH,) 45 MG INJECTION    Inject 45 mg into the skin every 6 (six) months. Filled by [place] (ZESTORETIC) 20-12.5 MG TABLET    Take 1 tablet by mouth 2 (two) times daily.   METFORMIN (GLUCOPHAGE) 500 MG TABLET    Take one tablet by mouth once daily with breakfast to control blood sugar   METOPROLOL TARTRATE (LOPRESSOR) 50 MG TABLET    Take 1 tablet (50 mg total) by mouth 2 (two) times daily.   OMEGA-3 FATTY ACIDS (FISH OIL) 1000 MG CAPS    Take 1 capsule by mouth daily.   PREDNISONE (DELTASONE) 20 MG TABLET    Take 1 tablet (20 mg total) by mouth 2 (two) times daily with a meal.   PYRIDOXINE (B-6) 100 MG TABLET    Take 100 mg by mouth daily.   SIMVASTATIN (ZOCOR) 20 MG  TABLET    Take 1 tablet (20 mg total) by mouth daily.   ZOSTER VACCINE ADJUVANTED [facility]) INJECTION    Inject 0.5 mLs into the muscle once.  Modified Medications   No medications on file  Discontinued Medications   No medications on file    Physical Exam:  There were no vitals filed for this visit. There is no height or weight on file to calculate BMI. Wt Readings from Last 3 Encounters:  05/09/19 271 lb (122.9 kg)  04/03/19 264 lb (119.7 kg)  11/24/18 261 lb 12.8 oz (118.8 kg)      Labs reviewed: Basic Metabolic Panel: Recent Labs    11/08/18 1007 11/21/18 0825 02/21/19 6644  NA 136 140 139  K 3.7 4.5 4.3  CL 106 105 107  CO2 20* 26 25  GLUCOSE 161* 126* 128*  BUN 17 25 25   CREATININE 1.05 1.21* 1.13*  CALCIUM 8.7* 8.8 8.6   Liver Function Tests: Recent Labs    11/08/18 1007 11/21/18 0825 02/21/19 0824  AST 17 11 11   ALT 12 8* 8*  ALKPHOS 70  --   --   BILITOT 0.5 0.5 0.5  PROT 6.1* 5.9* 5.9*  ALBUMIN 3.1*  --   --    No results for input(s): LIPASE, AMYLASE in the last 8760 hours. No results for input(s): AMMONIA in the last 8760 hours. CBC: Recent Labs    11/08/18 1007 11/21/18 0825 05/09/19 1602  WBC 8.2 9.3 12.8*  NEUTROABS 6.2 5,813 8,973*  HGB 12.3* 12.7* 12.1*  HCT 36.8* 37.2* 34.5*  MCV 85.0 84.2 83.3  PLT 195 210 195   Lipid Panel: Recent Labs    07/19/18 0818 11/21/18 0825 02/21/19 0824  CHOL 162 186 160  HDL 41 43 45  LDLCALC 104* 119* 97  TRIG 78 125 88  CHOLHDL 4.0 4.3 3.6   TSH: No results for input(s): TSH in the last 8760 hours. A1C: Lab Results  Component Value Date   HGBA1C 5.6 02/21/2019     Assessment/Plan 1. Acute gout of left hand, unspecified cause Labs reviewed with pt, cont BID prednisone until complete and extended prednisone as starting allopurinol can lead to more pain associated with gout.  -low purine diet will be mailed.  - Uric acid; Future - allopurinol (ZYLOPRIM) 100 MG tablet; Take 1  tablet (100 mg total) by mouth daily.  Dispense: 30 tablet; Refill: 6 - predniSONE (DELTASONE) 20 MG tablet; Take 1 tablet (20 mg total) by mouth daily with breakfast.  Dispense: 5 tablet; Refill: 0  Next appt:06/20/2019 for follow up lab.  Jeremiah Collins. Harle Battiest  [facility] [phone]    Virtual Visit via Telephone Note  I connected with pt on 05/10/19 at  2:45 PM EDT by telephone and verified that I am speaking with the correct person using two identifiers.  Location: Patient: home Provider: office    I discussed the limitations, risks, security and privacy concerns of performing an evaluation and management service by telephone and the availability of in person appointments. I also discussed with the patient that there may be a patient responsible charge related to this service. The patient expressed understanding and agreed to proceed.   I discussed the assessment and treatment plan with the patient. The patient was provided an opportunity to ask questions and all were answered. The patient agreed with the plan and demonstrated an understanding of the instructions.   The patient was advised to call back or seek an in-person evaluation if the symptoms worsen or if the condition fails to improve as anticipated.  I provided 9 minutes of non-face-to-face time during this encounter.  Jeremiah Collins. Harle Battiest Avs printed and mailed

## 2019-05-23 DIAGNOSIS — Z8546 Personal history of malignant neoplasm of prostate: Secondary | ICD-10-CM | POA: Diagnosis not present

## 2019-05-23 DIAGNOSIS — H31092 Other chorioretinal scars, left eye: Secondary | ICD-10-CM | POA: Diagnosis not present

## 2019-05-23 DIAGNOSIS — H3509 Other intraretinal microvascular abnormalities: Secondary | ICD-10-CM | POA: Diagnosis not present

## 2019-05-23 DIAGNOSIS — H35372 Puckering of macula, left eye: Secondary | ICD-10-CM | POA: Diagnosis not present

## 2019-05-23 DIAGNOSIS — H35033 Hypertensive retinopathy, bilateral: Secondary | ICD-10-CM | POA: Diagnosis not present

## 2019-05-23 DIAGNOSIS — Z961 Presence of intraocular lens: Secondary | ICD-10-CM | POA: Diagnosis not present

## 2019-05-29 ENCOUNTER — Other Ambulatory Visit: Payer: Self-pay | Admitting: *Deleted

## 2019-05-29 DIAGNOSIS — I10 Essential (primary) hypertension: Secondary | ICD-10-CM

## 2019-05-29 MED ORDER — DOXAZOSIN MESYLATE 4 MG PO TABS: 4.0000 mg | ORAL_TABLET | Freq: Every day | ORAL | 1 refills | Status: DC

## 2019-05-29 MED ORDER — AMLODIPINE BESYLATE 2.5 MG PO TABS: 2.5000 mg | ORAL_TABLET | Freq: Every day | ORAL | 1 refills | Status: DC

## 2019-05-29 NOTE — Telephone Encounter (Signed)
[facility] 

## 2019-06-05 DIAGNOSIS — G629 Polyneuropathy, unspecified: Secondary | ICD-10-CM | POA: Diagnosis not present

## 2019-06-05 DIAGNOSIS — I10 Essential (primary) hypertension: Secondary | ICD-10-CM | POA: Diagnosis not present

## 2019-06-05 DIAGNOSIS — R2681 Unsteadiness on feet: Secondary | ICD-10-CM | POA: Diagnosis not present

## 2019-06-05 DIAGNOSIS — Z5111 Encounter for antineoplastic chemotherapy: Secondary | ICD-10-CM | POA: Diagnosis not present

## 2019-06-05 DIAGNOSIS — C7951 Secondary malignant neoplasm of bone: Secondary | ICD-10-CM | POA: Diagnosis not present

## 2019-06-05 DIAGNOSIS — Z79818 Long term (current) use of other agents affecting estrogen receptors and estrogen levels: Secondary | ICD-10-CM | POA: Diagnosis not present

## 2019-06-05 DIAGNOSIS — Z192 Hormone resistant malignancy status: Secondary | ICD-10-CM | POA: Diagnosis not present

## 2019-06-05 DIAGNOSIS — C61 Malignant neoplasm of prostate: Secondary | ICD-10-CM | POA: Diagnosis not present

## 2019-06-06 DIAGNOSIS — Z85828 Personal history of other malignant neoplasm of skin: Secondary | ICD-10-CM | POA: Diagnosis not present

## 2019-06-06 DIAGNOSIS — L819 Disorder of pigmentation, unspecified: Secondary | ICD-10-CM | POA: Diagnosis not present

## 2019-06-06 DIAGNOSIS — D485 Neoplasm of uncertain behavior of skin: Secondary | ICD-10-CM | POA: Diagnosis not present

## 2019-06-07 DIAGNOSIS — C44222 Squamous cell carcinoma of skin of right ear and external auricular canal: Secondary | ICD-10-CM | POA: Diagnosis not present

## 2019-06-07 DIAGNOSIS — L821 Other seborrheic keratosis: Secondary | ICD-10-CM | POA: Diagnosis not present

## 2019-06-12 ENCOUNTER — Other Ambulatory Visit: Payer: Self-pay | Admitting: *Deleted

## 2019-06-12 DIAGNOSIS — C61 Malignant neoplasm of prostate: Secondary | ICD-10-CM | POA: Diagnosis not present

## 2019-06-12 DIAGNOSIS — Z01812 Encounter for preprocedural laboratory examination: Secondary | ICD-10-CM | POA: Diagnosis not present

## 2019-06-12 DIAGNOSIS — Z1159 Encounter for screening for other viral diseases: Secondary | ICD-10-CM | POA: Diagnosis not present

## 2019-06-12 MED ORDER — METFORMIN HCL 500 MG PO TABS: ORAL_TABLET | ORAL | 1 refills | Status: DC

## 2019-06-12 NOTE — Telephone Encounter (Signed)
[facility] 

## 2019-06-18 ENCOUNTER — Other Ambulatory Visit: Payer: Self-pay

## 2019-06-18 ENCOUNTER — Other Ambulatory Visit: Payer: Medicare Other

## 2019-06-18 DIAGNOSIS — E782 Mixed hyperlipidemia: Secondary | ICD-10-CM

## 2019-06-18 DIAGNOSIS — I10 Essential (primary) hypertension: Secondary | ICD-10-CM

## 2019-06-18 DIAGNOSIS — M109 Gout, unspecified: Secondary | ICD-10-CM

## 2019-06-18 LAB — COMPLETE METABOLIC PANEL WITH GFR
AG Ratio: 1.4 (calc) (ref 1.0–2.5)
ALT: 8 U/L — ABNORMAL LOW (ref 9–46)
AST: 11 U/L (ref 10–35)
Albumin: 3.4 g/dL — ABNORMAL LOW (ref 3.6–5.1)
Alkaline phosphatase (APISO): 82 U/L (ref 35–144)
BUN/Creatinine Ratio: 25 (calc) — ABNORMAL HIGH (ref 6–22)
BUN: 29 mg/dL — ABNORMAL HIGH (ref 7–25)
CO2: 24 mmol/L (ref 20–32)
Calcium: 8.8 mg/dL (ref 8.6–10.3)
Chloride: 108 mmol/L (ref 98–110)
Creat: 1.18 mg/dL — ABNORMAL HIGH (ref 0.70–1.11)
GFR, Est African American: 66 mL/min/{1.73_m2} (ref >=60)
GFR, Est Non African American: 57 mL/min/{1.73_m2} — ABNORMAL LOW (ref >=60)
Globulin: 2.4 g/dL (calc) (ref 1.9–3.7)
Glucose, Bld: 125 mg/dL — ABNORMAL HIGH (ref 65–99)
Potassium: 4.5 mmol/L (ref 3.5–5.3)
Sodium: 141 mmol/L (ref 135–146)
Total Bilirubin: 0.4 mg/dL (ref 0.2–1.2)
Total Protein: 5.8 g/dL — ABNORMAL LOW (ref 6.1–8.1)

## 2019-06-18 LAB — CBC WITH DIFFERENTIAL/PLATELET
Absolute Monocytes: 486 cells/uL (ref 200–950)
Basophils Absolute: 41 cells/uL (ref 0–200)
Basophils Relative: 0.5 %
Eosinophils Absolute: 130 cells/uL (ref 15–500)
Eosinophils Relative: 1.6 %
HCT: 33.2 % — ABNORMAL LOW (ref 38.5–50.0)
Hemoglobin: 11.3 g/dL — ABNORMAL LOW (ref 13.2–17.1)
Lymphs Abs: 2325 cells/uL (ref 850–3900)
MCH: 29 pg (ref 27.0–33.0)
MCHC: 34 g/dL (ref 32.0–36.0)
MCV: 85.3 fL (ref 80.0–100.0)
MPV: 10 fL (ref 7.5–12.5)
Monocytes Relative: 6 %
Neutro Abs: 5119 cells/uL (ref 1500–7800)
Neutrophils Relative %: 63.2 %
Platelets: 193 10*3/uL (ref 140–400)
RBC: 3.89 10*6/uL — ABNORMAL LOW (ref 4.20–5.80)
RDW: 15 % (ref 11.0–15.0)
Total Lymphocyte: 28.7 %
WBC: 8.1 10*3/uL (ref 3.8–10.8)

## 2019-06-18 LAB — URIC ACID: Uric Acid, Serum: 7.4 mg/dL (ref 4.0–8.0)

## 2019-06-18 LAB — LIPID PANEL
Cholesterol: 134 mg/dL (ref <200)
HDL: 38 mg/dL — ABNORMAL LOW (ref >=40)
LDL Cholesterol (Calc): 80 mg/dL (calc)
Non-HDL Cholesterol (Calc): 96 mg/dL (calc) (ref <130)
Total CHOL/HDL Ratio: 3.5 (calc) (ref <5.0)
Triglycerides: 80 mg/dL (ref <150)

## 2019-06-20 ENCOUNTER — Other Ambulatory Visit: Payer: Medicare Other

## 2019-06-20 DIAGNOSIS — Z7984 Long term (current) use of oral hypoglycemic drugs: Secondary | ICD-10-CM | POA: Diagnosis not present

## 2019-06-20 DIAGNOSIS — Z466 Encounter for fitting and adjustment of urinary device: Secondary | ICD-10-CM | POA: Diagnosis not present

## 2019-06-20 DIAGNOSIS — E1140 Type 2 diabetes mellitus with diabetic neuropathy, unspecified: Secondary | ICD-10-CM | POA: Diagnosis not present

## 2019-06-20 DIAGNOSIS — N135 Crossing vessel and stricture of ureter without hydronephrosis: Secondary | ICD-10-CM | POA: Diagnosis not present

## 2019-06-20 DIAGNOSIS — Z7982 Long term (current) use of aspirin: Secondary | ICD-10-CM | POA: Diagnosis not present

## 2019-06-20 DIAGNOSIS — Z9079 Acquired absence of other genital organ(s): Secondary | ICD-10-CM | POA: Diagnosis not present

## 2019-06-20 DIAGNOSIS — Z79899 Other long term (current) drug therapy: Secondary | ICD-10-CM | POA: Diagnosis not present

## 2019-06-20 DIAGNOSIS — C61 Malignant neoplasm of prostate: Secondary | ICD-10-CM | POA: Diagnosis not present

## 2019-06-20 DIAGNOSIS — I129 Hypertensive chronic kidney disease with stage 1 through stage 4 chronic kidney disease, or unspecified chronic kidney disease: Secondary | ICD-10-CM | POA: Diagnosis not present

## 2019-06-20 DIAGNOSIS — Z8546 Personal history of malignant neoplasm of prostate: Secondary | ICD-10-CM | POA: Diagnosis not present

## 2019-06-20 DIAGNOSIS — R2689 Other abnormalities of gait and mobility: Secondary | ICD-10-CM | POA: Diagnosis not present

## 2019-06-20 DIAGNOSIS — E1122 Type 2 diabetes mellitus with diabetic chronic kidney disease: Secondary | ICD-10-CM | POA: Diagnosis not present

## 2019-06-20 DIAGNOSIS — Z87891 Personal history of nicotine dependence: Secondary | ICD-10-CM | POA: Diagnosis not present

## 2019-06-20 DIAGNOSIS — I6523 Occlusion and stenosis of bilateral carotid arteries: Secondary | ICD-10-CM | POA: Diagnosis not present

## 2019-06-20 DIAGNOSIS — I350 Nonrheumatic aortic (valve) stenosis: Secondary | ICD-10-CM | POA: Diagnosis not present

## 2019-06-20 DIAGNOSIS — N183 Chronic kidney disease, stage 3 (moderate): Secondary | ICD-10-CM | POA: Diagnosis not present

## 2019-06-20 DIAGNOSIS — C7951 Secondary malignant neoplasm of bone: Secondary | ICD-10-CM | POA: Diagnosis not present

## 2019-06-20 DIAGNOSIS — N320 Bladder-neck obstruction: Secondary | ICD-10-CM | POA: Diagnosis not present

## 2019-06-22 ENCOUNTER — Ambulatory Visit (INDEPENDENT_AMBULATORY_CARE_PROVIDER_SITE_OTHER): Payer: Medicare Other | Admitting: Nurse Practitioner

## 2019-06-22 ENCOUNTER — Other Ambulatory Visit: Payer: Self-pay

## 2019-06-22 ENCOUNTER — Encounter: Payer: Self-pay | Admitting: Nurse Practitioner

## 2019-06-22 VITALS — BP 138/74 | HR 64 | Temp 97.9°F | Resp 22 | Ht 71.0 in | Wt 269.0 lb

## 2019-06-22 DIAGNOSIS — M1A9XX0 Chronic gout, unspecified, without tophus (tophi): Secondary | ICD-10-CM | POA: Diagnosis not present

## 2019-06-22 DIAGNOSIS — I10 Essential (primary) hypertension: Secondary | ICD-10-CM

## 2019-06-22 DIAGNOSIS — E1142 Type 2 diabetes mellitus with diabetic polyneuropathy: Secondary | ICD-10-CM | POA: Diagnosis not present

## 2019-06-22 DIAGNOSIS — C7951 Secondary malignant neoplasm of bone: Secondary | ICD-10-CM | POA: Diagnosis not present

## 2019-06-22 DIAGNOSIS — R600 Localized edema: Secondary | ICD-10-CM

## 2019-06-22 DIAGNOSIS — I420 Dilated cardiomyopathy: Secondary | ICD-10-CM

## 2019-06-22 DIAGNOSIS — C7952 Secondary malignant neoplasm of bone marrow: Secondary | ICD-10-CM | POA: Diagnosis not present

## 2019-06-22 MED ORDER — LISINOPRIL-HYDROCHLOROTHIAZIDE 20-12.5 MG PO TABS: 2.0000 | ORAL_TABLET | Freq: Every day | ORAL | 1 refills | Status: DC

## 2019-06-22 NOTE — Progress Notes (Signed)
Careteam: Patient Care Team: Lauree Chandler, NP as PCP - General (Geriatric Medicine) Myrlene Broker, MD as Attending Physician (Urology) [name], MD as Consulting Physician (Ophthalmology) [name], MD as Consulting Physician (Ophthalmology)  Advanced Directive information Does Patient Have a Medical Advance Directive?: No(Not interested)  Allergies  Allergen Reactions  . Abiraterone Nausea And Vomiting and Other (See Comments)    Other reaction(s): Increased Heart Rate (intolerance)     Chief Complaint  Patient presents with  . Follow-up    4 Month follow up, patient want to talk abut how to take his lisinopril     HPI: Patient is a 85 y.o. male seen in the office today for routine follow up.  Pt was seen in June with acute gout. He has been started on allopurinol due to gout in left thumb - no further pain or swelling noted.   states in the last few weeks he started having ankle pain and swelling which has been chronic getting worse.  Had radiation in leg due to bone metastases from prostate cancer. Followed closely by hematology and oncology for this.  Sore most the time and occasionally will have a shooting pain.   4/10 pain, has not taken any medication for pain.   Obesity - ongoing, has not lost weight. Does not follow diet modifications. no routine exercise but every active.   mitral annular calcification -Followed by cardio Dr Leonia Reeves NP but missed last appt in Oct 2019 unsure why, last follow up in oct 2018 2D echo revealed mild AS and moderate focal basal hypertrophy of septum with mild concentric hypertrophy and nml EF (60-65%); grade 1 DD. He has chronic SOB without changes. No CP.  With chronic edema, unchanged. appt next month for follow up.   hx prostate CA and rec'd XRT for bone mets (left tibia and pelvis) - He is currently on injection q62mos. He also takes cardura. PSA stable on Xtandi. He gets surveillance CT  chest/abd/pelvis and whole body bone scan at [facility]. No changes compared to previous studies. Followed by urology Dr Rosana Hoes and oncology Dr Marcello Moores. LDH 121; PSA <0.01. He had ureteral stent change in July 2020 and stent changed q16mos.   DM - No low BS reactions. He takes metformin. A1c5.6%. Eye exam . No diabetic changes -Followed by Dr Delman Cheadle. He has noticed numbness/tingling in feet since beginning Gerty. He feels like he is not sure if the neuropathy is coming from the diabetes or the xtandi (possible side effect)  HTN - reports blood pressure in the 80s/50 88/40 in the evening.  Hyperlipidemia -improved with increase to 10 mg simvastatin but not at goal; no side effects with increase in dose.  Occasional muscle weakness. LDL 80 which has improved   Multiple joint pain - mostly in back. Has a lot of joint pain but no worse except in the ankle. Does not need to take medication for this. He takes prn tylenolwhen he needs it.now having pain in ankle, states he is following with his orthopedic later this month and he plans to follow up with him on this. He will take images of ankle and he was the one who did surgery in the past.   Hx anemia - hgb down to 11.3   CKD - stage 2. Cr 1.13 Stable  lesion to left earlobe, dermatology took sample and is cancer- he has follow up scheduled.   Review of Systems:  Review of Systems  Constitutional:  Negative for chills, fever and weight loss.  HENT: Negative for tinnitus.   Respiratory: Negative for cough, sputum production and shortness of breath.   Cardiovascular: Positive for leg swelling (stable). Negative for chest pain and palpitations.  Gastrointestinal: Negative for abdominal pain, constipation, diarrhea and heartburn.  Genitourinary: Positive for frequency. Negative for dysuria and urgency.  Musculoskeletal: Positive for back pain (occasionally). Negative for falls, joint pain and myalgias.  Skin: Negative.    Neurological: Positive for tingling. Negative for dizziness and headaches.  Psychiatric/Behavioral: Positive for memory loss. Negative for depression. The patient does not have insomnia.     Past Medical History:  Diagnosis Date  . Acute bronchitis 09/12/2012  . 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 Bone metastases ([place])    left mid tibial shaft  . Cataract    Dr.Groat  . Cellulitis and abscess of leg, except foot 01/17/2012  . Chest pain, unspecified 04/28/2004  . Closed fracture of five ribs 03/24/2004  . First degree atrioventricular block 04/25/2007  . Gross hematuria 09/06/2011  . History of radiation therapy 04/03/14- 04/17/14   mid to distal left tibia 3000 cGy in 10 sessions  . HTN (hypertension)   . Hx of radiation therapy 11/1998   prostate fossa - 6040 cGy, 33 fx, Dr Danny Lawless  . Osteoarthrosis involving, or with mention of more than one site, but not specified as generalized, multiple sites 10/22/2010  . Other abnormal blood chemistry 04/29/1991  . Other and unspecified hyperlipidemia 01/02/2013  . Palpitations 04/28/2004  . Prostate cancer ([place]) 12/16/1994   gleason 7  . Reflux esophagitis 05/10/2008  . Routine general medical examination at a health care facility   . Spinal stenosis, unspecified region other than cervical 04/29/1995  . Type II or unspecified type diabetes mellitus without mention of complication, uncontrolled    Past Surgical History:  Procedure Laterality Date  . AIR/FLUID EXCHANGE Left 01/09/2016   Procedure: AIR/FLUID EXCHANGE;  Surgeon: Jalene Mullet, MD;  Location: [place];  Service: Ophthalmology;  Laterality: Left;  . CYSTOSCOPY W/ URETERAL STENT PLACEMENT  12/31/14  . CYSTOSCOPY W/ URETERAL STENT PLACEMENT  05/16/2015  . EYE SURGERY  2006   cataract, Dr Shanon Rosser  . LASER PHOTO ABLATION Left 01/09/2016   Procedure: LASER PHOTO ABLATION;  Surgeon: Jalene Mullet, MD;  Location: [place];  Service: Ophthalmology;  Laterality: Left;  Endolaser  . Left ureteral  stent placement  Week of 12/05/11  . PARS PLANA VITRECTOMY Left 01/09/2016   Procedure: PARS PLANA VITRECTOMY WITH 25 GAUGE LEFT EYE ;  Surgeon: Jalene Mullet, MD;  Location: [place];  Service: Ophthalmology;  Laterality: Left;  . PERFLUORONE INJECTION Left 01/09/2016   Procedure: PERFLUORONE INJECTION;  Surgeon: Jalene Mullet, MD;  Location: [place];  Service: Ophthalmology;  Laterality: Left;  . PROSTATECTOMY  1996   Dr. Rosana Hoes   Social History:   reports that he quit smoking about 50 years ago. His smoking use included cigars. He quit after 5.00 years of use. He has never used smokeless tobacco. He reports that he does not drink alcohol or use drugs.  Family History  Problem Relation Age of Onset  . Heart disease Father   . Stroke Sister     Medications: Patient's Medications  New Prescriptions   No medications on file  Previous Medications   ACETAMINOPHEN (TYLENOL) 650 MG CR TABLET    Take 650 mg by mouth as needed for pain.   ALLOPURINOL (ZYLOPRIM) 100 MG TABLET    Take 1 tablet (100 mg  total) by mouth daily.   ASPIRIN 81 MG TABLET    Take 1 tablet (81 mg total) by mouth daily.   CALCIUM CARBONATE (TUMS - DOSED IN MG ELEMENTAL CALCIUM) 500 MG CHEWABLE TABLET    Chew 1 tablet by mouth as needed for indigestion or heartburn.   CHOLECALCIFEROL 1000 UNITS TABLET    Take 1,000 Units by mouth daily.   DOXAZOSIN (CARDURA) 4 MG TABLET    Take 1 tablet (4 mg total) by mouth daily.   ENZALUTAMIDE (XTANDI) 40 MG CAPSULE    Take 120 mg by mouth daily.    GLUCOSE BLOOD (ONETOUCH VERIO) TEST STRIP    Check blood sugar one to two times a week. Dx:E11.29   LEUPROLIDE, 6 MONTH, (LEUPROLIDE ACETATE, 6 MONTH,) 45 MG INJECTION    Inject 45 mg into the skin every 6 (six) months. Filled by [facility] (GLUCOPHAGE) 500 MG TABLET    Take one tablet by mouth once daily with breakfast to control blood sugar   METOPROLOL TARTRATE (LOPRESSOR) 50 MG TABLET    Take 1 tablet (50 mg total) by mouth 2  (two) times daily.   OMEGA-3 FATTY ACIDS (FISH OIL) 1000 MG CAPS    Take 1 capsule by mouth daily.   PREDNISONE (DELTASONE) 20 MG TABLET    Take 1 tablet (20 mg total) by mouth daily with breakfast.   PYRIDOXINE (B-6) 100 MG TABLET    Take 100 mg by mouth daily.   SIMVASTATIN (ZOCOR) 20 MG TABLET    Take 1 tablet (20 mg total) by mouth daily.   ZOSTER VACCINE ADJUVANTED [facility]) INJECTION    Inject 0.5 mLs into the muscle once.  Modified Medications   Modified Medication Previous Medication   LISINOPRIL-HYDROCHLOROTHIAZIDE (ZESTORETIC) 20-12.5 MG TABLET lisinopril-hydrochlorothiazide (ZESTORETIC) 20-12.5 MG tablet      Take 2 tablets by mouth daily.    Take 1 tablet by mouth 2 (two) times daily.  Discontinued Medications   AMLODIPINE (NORVASC) 2.5 MG TABLET    Take 1 tablet (2.5 mg total) by mouth daily.    Physical Exam:  Vitals:   06/22/19 1309  BP: 138/74  Pulse: 64  Resp: (!) 22  Temp: 97.9 F (36.6 C)  TempSrc: Oral  SpO2: 97%  Weight: 269 lb (122 kg)  Height: 5\' 11"  (1.803 m)   Body mass index is 37.52 kg/m. Wt Readings from Last 3 Encounters:  06/22/19 269 lb (122 kg)  05/09/19 271 lb (122.9 kg)  04/03/19 264 lb (119.7 kg)    Physical Exam Constitutional:      Appearance: Normal appearance. He is well-developed.  Eyes:     General: No scleral icterus.    Pupils: Pupils are equal, round, and reactive to light.  Neck:     Musculoskeletal: Neck supple.     Thyroid: No thyromegaly.     Vascular: No carotid bruit.  Cardiovascular:     Rate and Rhythm: Normal rate and regular rhythm.     Heart sounds: Murmur (1/6 SEM) present. No friction rub. No gallop.      Comments: +1 pitting LE edema b/l. No calf TTP Pulmonary:     Effort: Pulmonary effort is normal.     Breath sounds: Normal breath sounds. No wheezing or rales.  Chest:     Chest wall: No tenderness.  Abdominal:     General: Bowel sounds are normal. There is no distension or abdominal bruit.      Palpations: Abdomen is soft.  Abdomen is not rigid. There is no hepatomegaly, mass or pulsatile mass.     Tenderness: There is no abdominal tenderness. There is no guarding or rebound.     Hernia: No hernia is present.     Comments: obese  Musculoskeletal:     Right lower leg: Edema present.     Left lower leg: Edema present.  Lymphadenopathy:     Cervical: No cervical adenopathy.  Skin:    General: Skin is warm and dry.     Findings: No rash.     Comments: Thick brittle toenails. Decrease sensation to bilateral LE  Neurological:     Mental Status: He is alert and oriented to person, place, and time.     Deep Tendon Reflexes: Reflexes are normal and symmetric.  Psychiatric:        Behavior: Behavior normal.        Thought Content: Thought content normal.        Judgment: Judgment normal.     Labs reviewed: Basic Metabolic Panel: Recent Labs    11/21/18 0825 02/21/19 0824 06/18/19 0809  NA 140 139 141  K 4.5 4.3 4.5  CL 105 107 108  CO2 26 25 24   GLUCOSE 126* 128* 125*  BUN 25 25 29*  CREATININE 1.21* 1.13* 1.18*  CALCIUM 8.8 8.6 8.8   Liver Function Tests: Recent Labs    11/08/18 1007 11/21/18 0825 02/21/19 0824 06/18/19 0809  AST 17 11 11 11   ALT 12 8* 8* 8*  ALKPHOS 70  --   --   --   BILITOT 0.5 0.5 0.5 0.4  PROT 6.1* 5.9* 5.9* 5.8*  ALBUMIN 3.1*  --   --   --    No results for input(s): LIPASE, AMYLASE in the last 8760 hours. No results for input(s): AMMONIA in the last 8760 hours. CBC: Recent Labs    11/21/18 0825 05/09/19 1602 06/18/19 0809  WBC 9.3 12.8* 8.1  NEUTROABS 5,813 8,973* 5,119  HGB 12.7* 12.1* 11.3*  HCT 37.2* 34.5* 33.2*  MCV 84.2 83.3 85.3  PLT 210 195 193   Lipid Panel: Recent Labs    11/21/18 0825 02/21/19 0824 06/18/19 0809  CHOL 186 160 134  HDL 43 45 38*  LDLCALC 119* 97 80  TRIG 125 88 80  CHOLHDL 4.3 3.6 3.5   TSH: No results for input(s): TSH in the last 8760 hours. A1C: Lab Results  Component Value Date    HGBA1C 5.6 02/21/2019     Assessment/Plan 1. Essential hypertension, benign -blood pressure low in the evenings and slightly elevated at this time -will stop norvasc at this time due to LE edema and have him take lisinopril-hctz 2 tablets daily and to continue to monitor BP.   - lisinopril-hydrochlorothiazide (ZESTORETIC) 20-12.5 MG tablet; Take 2 tablets by mouth daily.  Dispense: 90 tablet; Refill: 1  2. Chronic gout without tophus, unspecified cause, unspecified site Improved, continues allopurinol, uric acid level improved at this time  3. DM type 2 with diabetic peripheral neuropathy ([place]) -controlled, will continue current medication.  -Encouraged dietary compliance, routine foot care/monitoring and to keep up with diabetic eye exams through ophthalmology  - Ambulatory referral to Podiatry  4. Secondary malignant neoplasm of bone and bone marrow (H[facility]) -continues to follow up with oncology, per notes Left tibia and pelvis, previously treated with radiation. They have considered adding zometa or denosumab, but patient had to decline additional treatments   5. Bilateral edema of lower extremity Worse, recently started  norvasc low dose 2.5 mg, will stop at this time. To see if this improves symptoms.   6. Congestive dilated cardiomyopathy ([place]) -has follow up scheduled with cardiologist next month.   7. Malignant neoplasm of prostate Continues to follow up with oncologist, per notes clinically stable while on reduced-dose Enzalutamide 120 mg per day and ADT (Eligard) every 6 months. He has had an excellent biochemical response with undetectable PSA levels.   Next appt: 3 month follow up [place]. [place], [facility] [phone]

## 2019-06-22 NOTE — Patient Instructions (Addendum)
STOP NORVASC 2.5 mg daily  Start taking lisinopril/hctz 2 tablets in the morning only  Continue to take blood pressure AFTER you have taken medication- wait about 1 hour.   Goal is <140/90  Keep follow up with cardiologist next month  Referral made to podiatrist so they can cut your toe nails.

## 2019-06-27 ENCOUNTER — Other Ambulatory Visit: Payer: Self-pay

## 2019-06-27 ENCOUNTER — Ambulatory Visit (INDEPENDENT_AMBULATORY_CARE_PROVIDER_SITE_OTHER): Payer: Medicare Other | Admitting: Podiatry

## 2019-06-27 ENCOUNTER — Encounter: Payer: Self-pay | Admitting: Podiatry

## 2019-06-27 ENCOUNTER — Ambulatory Visit (INDEPENDENT_AMBULATORY_CARE_PROVIDER_SITE_OTHER): Payer: Medicare Other

## 2019-06-27 VITALS — BP 139/62 | Temp 97.2°F

## 2019-06-27 DIAGNOSIS — M659 Synovitis and tenosynovitis, unspecified: Secondary | ICD-10-CM

## 2019-06-27 DIAGNOSIS — E1140 Type 2 diabetes mellitus with diabetic neuropathy, unspecified: Secondary | ICD-10-CM | POA: Insufficient documentation

## 2019-06-27 DIAGNOSIS — R600 Localized edema: Secondary | ICD-10-CM | POA: Diagnosis not present

## 2019-06-27 DIAGNOSIS — M79674 Pain in right toe(s): Secondary | ICD-10-CM

## 2019-06-27 DIAGNOSIS — M79675 Pain in left toe(s): Secondary | ICD-10-CM | POA: Diagnosis not present

## 2019-06-27 DIAGNOSIS — E1142 Type 2 diabetes mellitus with diabetic polyneuropathy: Secondary | ICD-10-CM | POA: Diagnosis not present

## 2019-06-27 DIAGNOSIS — B351 Tinea unguium: Secondary | ICD-10-CM

## 2019-06-27 DIAGNOSIS — M65972 Unspecified synovitis and tenosynovitis, left ankle and foot: Secondary | ICD-10-CM

## 2019-06-27 DIAGNOSIS — M19072 Primary osteoarthritis, left ankle and foot: Secondary | ICD-10-CM

## 2019-06-27 NOTE — Addendum Note (Signed)
Addended by: Thereasa Distance on: 06/27/2019 11:03 AM   Modules accepted: Orders

## 2019-06-27 NOTE — Progress Notes (Signed)
This patient presents to the office with chief complaint of long thick nails and diabetic feet.  This patient  says there  is  no pain and discomfort in their feet.  This patient says there are long thick painful nails.  These nails are painful walking and wearing shoes.  Patient has no history of infection or drainage from both feet.  Patient is unable to  self treat his own nails . This patient presents  to the office today for treatment of the  long nails and a foot evaluation due to history of  diabetes.  General Appearance  Alert, conversant and in no acute stress.  Vascular  Dorsalis pedis and posterior tibial  pulses are palpable  bilaterally.  Capillary return is within normal limits  bilaterally. Temperature is within normal limits  bilaterally.  Neurologic  Senn-Weinstein monofilament wire test absent   bilaterally. Muscle power within normal limits bilaterally.  Nails Thick disfigured discolored nails with subungual debris  from hallux to fifth toes bilaterally. No evidence of bacterial infection or drainage bilaterally.  Orthopedic  No limitations of motion of motion feet .  No crepitus or effusions noted.  No bony pathology or digital deformities noted. Severe swelling feet/ankles  B/L  Skin  normotropic skin with no porokeratosis noted bilaterally.  No signs of infections or ulcers noted.     Onychomycosis  Diabetes with no foot complications  IE  Debride nails x 10.  A diabetic foot exam was performed and there is no evidence of any vascular or neurologic pathology.   RTC 3 months.  Patient to be seen by Dr.  Amalia Hailey for his right foot pain.   Gardiner Barefoot DPM

## 2019-06-28 DIAGNOSIS — D485 Neoplasm of uncertain behavior of skin: Secondary | ICD-10-CM | POA: Diagnosis not present

## 2019-06-28 DIAGNOSIS — C44222 Squamous cell carcinoma of skin of right ear and external auricular canal: Secondary | ICD-10-CM | POA: Diagnosis not present

## 2019-06-28 DIAGNOSIS — C44212 Basal cell carcinoma of skin of right ear and external auricular canal: Secondary | ICD-10-CM | POA: Diagnosis not present

## 2019-07-01 NOTE — Progress Notes (Signed)
   Subjective:  85 y.o. male presenting today as a new patient with a chief complaint of dull aching pain to the medial aspect of the left ankle that began 2-3 weeks ago. He states the pain is gradually worsening. Walking and standing increases the pain. He has not had any recent treatment for the symptoms. He reports previous surgery on the left ankle stating he has screws in place. Patient is here for further evaluation and treatment.   Past Medical History:  Diagnosis Date  . Acute bronchitis 09/12/2012  . 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 Bone metastases ([place])    left mid tibial shaft  . Cataract    Dr.Groat  . Cellulitis and abscess of leg, except foot 01/17/2012  . Chest pain, unspecified 04/28/2004  . Closed fracture of five ribs 03/24/2004  . First degree atrioventricular block 04/25/2007  . Gross hematuria 09/06/2011  . History of radiation therapy 04/03/14- 04/17/14   mid to distal left tibia 3000 cGy in 10 sessions  . HTN (hypertension)   . Hx of radiation therapy 11/1998   prostate fossa - 6040 cGy, 33 fx, Dr Danny Lawless  . Osteoarthrosis involving, or with mention of more than one site, but not specified as generalized, multiple sites 10/22/2010  . Other abnormal blood chemistry 04/29/1991  . Other and unspecified hyperlipidemia 01/02/2013  . Palpitations 04/28/2004  . Prostate cancer ([place]) 12/16/1994   gleason 7  . Reflux esophagitis 05/10/2008  . Routine general medical examination at a health care facility   . Spinal stenosis, unspecified region other than cervical 04/29/1995  . Type II or unspecified type diabetes mellitus without mention of complication, uncontrolled      Objective / Physical Exam:  General:  The patient is alert and oriented x3 in no acute distress. Dermatology:  Skin is warm, dry and supple bilateral lower extremities. Negative for open lesions or macerations. Vascular:  Palpable pedal pulses bilaterally. No erythema noted. Capillary refill within  normal limits. Neurological:  Epicritic and protective threshold grossly intact bilaterally.  Musculoskeletal Exam:  Pain on palpation to the anterior lateral medial aspects of the patient's left ankle. Left ankle edema noted. Range of motion within normal limits to all pedal and ankle joints bilateral. Muscle strength 5/5 in all groups bilateral.   Radiographic Exam:  Normal osseous mineralization. Joint spaces preserved. No fracture/dislocation/boney destruction.    Assessment: 1. Left ankle DJD / synovitis  2. Left ankle edema 3. H/o left leg ORIF  Plan of Care:  1. Patient was evaluated. X-Rays reviewed.  2. Injection of 0.5 mL Celestone Soluspan injected in the patient's left ankle. 3. Unna boot applied.  4. Post op shoe dispensed.  5. Patient has follow up appointment with surgeon in approximately three weeks.  6. Return to clinic as needed.    Edrick Kins, DPM [facility]  Dr. Edrick Kins, [address]                                        [place], [place] [address]                Office [phone]  Fax [phone]

## 2019-07-04 ENCOUNTER — Ambulatory Visit (INDEPENDENT_AMBULATORY_CARE_PROVIDER_SITE_OTHER): Payer: Medicare Other | Admitting: Podiatry

## 2019-07-04 ENCOUNTER — Other Ambulatory Visit: Payer: Self-pay

## 2019-07-04 ENCOUNTER — Encounter: Payer: Self-pay | Admitting: Podiatry

## 2019-07-04 VITALS — Temp 97.5°F

## 2019-07-04 DIAGNOSIS — R600 Localized edema: Secondary | ICD-10-CM

## 2019-07-04 DIAGNOSIS — L97922 Non-pressure chronic ulcer of unspecified part of left lower leg with fat layer exposed: Secondary | ICD-10-CM | POA: Diagnosis not present

## 2019-07-04 DIAGNOSIS — M659 Synovitis and tenosynovitis, unspecified: Secondary | ICD-10-CM

## 2019-07-06 NOTE — Progress Notes (Signed)
Subjective: 85 year old male presenting today for follow up evaluation of left ankle pain and swelling. He states his symptoms have not changed since his last visit. He also notes a lesion on the posterior left leg. He states he recently removed a scab from it and has had some bleeding. He has not done anything for treatment of the wound. He has been using the post op shoe as directed. Patient is here for further evaluation and treatment.    Past Medical History:  Diagnosis Date  . Acute bronchitis 09/12/2012  . 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 Bone metastases ([place])    left mid tibial shaft  . Cataract    Dr.Groat  . Cellulitis and abscess of leg, except foot 01/17/2012  . Chest pain, unspecified 04/28/2004  . Closed fracture of five ribs 03/24/2004  . First degree atrioventricular block 04/25/2007  . Gross hematuria 09/06/2011  . History of radiation therapy 04/03/14- 04/17/14   mid to distal left tibia 3000 cGy in 10 sessions  . HTN (hypertension)   . Hx of radiation therapy 11/1998   prostate fossa - 6040 cGy, 33 fx, Dr Danny Lawless  . Osteoarthrosis involving, or with mention of more than one site, but not specified as generalized, multiple sites 10/22/2010  . Other abnormal blood chemistry 04/29/1991  . Other and unspecified hyperlipidemia 01/02/2013  . Palpitations 04/28/2004  . Prostate cancer ([place]) 12/16/1994   gleason 7  . Reflux esophagitis 05/10/2008  . Routine general medical examination at a health care facility   . Spinal stenosis, unspecified region other than cervical 04/29/1995  . Type II or unspecified type diabetes mellitus without mention of complication, uncontrolled      Objective/Physical Exam General: The patient is alert and oriented x3 in no acute distress.  Dermatology:  Wound #1 noted to the posterior left leg measuring 0.6 x 0.6 x 0.2 cm (LxWxD).   To the noted ulceration(s), there is no eschar. There is a moderate amount of slough, fibrin, and necrotic  tissue noted. Granulation tissue and wound base is red. There is a minimal amount of serosanguineous drainage noted. There is no exposed bone muscle-tendon ligament or joint. There is no malodor. Periwound integrity is intact. Skin is warm, dry and supple bilateral lower extremities.  Vascular: Palpable pedal pulses bilaterally. Mild edema noted. Capillary refill within normal limits. Varicosities noted bilateral lower extremities.   Neurological: Epicritic and protective threshold diminished bilaterally.   Musculoskeletal Exam: Left ankle edema noted. Pain with palpation noted to the anterior, medial and lateral aspects of the left ankle. Range of motion within normal limits to all pedal and ankle joints bilateral. Muscle strength 5/5 in all groups bilateral.   Assessment: #1 ulceration noted to the posterior left leg secondary to venous insufficiency #2 varicosities bilateral lower extremities #3 Left ankle edema #4 h/o left leg ORIF  Plan of Care:  #1 Patient was evaluated. #2 medically necessary excisional debridement including subcutaneous tissue was performed using a tissue nipper and a chisel blade. Excisional debridement of all the necrotic nonviable tissue down to healthy bleeding viable tissue was performed with post-debridement measurements same as pre-. #3 the wound was cleansed with normal saline. #4 Silver nitrate applied.  #5 Recommended Silvadene cream daily with a bandage.  #6 Recommended follow up with surgeon on 07/19/2019.  #7 Return to clinic in 3 weeks.    Edrick Kins, DPM [facility]  Dr. Edrick Kins, DPM    [employer]  [place], [place] [address]                Office [phone]  Fax [phone]

## 2019-07-10 ENCOUNTER — Other Ambulatory Visit: Payer: Self-pay

## 2019-07-10 ENCOUNTER — Telehealth: Payer: Self-pay | Admitting: Nurse Practitioner

## 2019-07-10 ENCOUNTER — Ambulatory Visit (INDEPENDENT_AMBULATORY_CARE_PROVIDER_SITE_OTHER): Payer: Medicare Other | Admitting: Family

## 2019-07-10 ENCOUNTER — Encounter: Payer: Self-pay | Admitting: Family

## 2019-07-10 DIAGNOSIS — M25572 Pain in left ankle and joints of left foot: Secondary | ICD-10-CM | POA: Diagnosis not present

## 2019-07-10 DIAGNOSIS — M25472 Effusion, left ankle: Secondary | ICD-10-CM | POA: Diagnosis not present

## 2019-07-10 DIAGNOSIS — R609 Edema, unspecified: Secondary | ICD-10-CM | POA: Diagnosis not present

## 2019-07-10 DIAGNOSIS — G8929 Other chronic pain: Secondary | ICD-10-CM

## 2019-07-10 DIAGNOSIS — M8580 Other specified disorders of bone density and structure, unspecified site: Secondary | ICD-10-CM | POA: Diagnosis not present

## 2019-07-10 DIAGNOSIS — M7989 Other specified soft tissue disorders: Secondary | ICD-10-CM | POA: Diagnosis not present

## 2019-07-10 DIAGNOSIS — R0602 Shortness of breath: Secondary | ICD-10-CM

## 2019-07-10 MED ORDER — TORSEMIDE 10 MG PO TABS: 10.0000 mg | ORAL_TABLET | Freq: Every day | ORAL | 3 refills | Status: DC

## 2019-07-10 NOTE — Telephone Encounter (Signed)
TeleVisit Appointment scheduled with [employer]

## 2019-07-10 NOTE — Patient Instructions (Addendum)
1.Torsemide 10 mg tablet one by mouth daily x 3 days then change to one by mouth as needed for worsening leg edema ,cough or shortness of breath or abrupt weight gain.  2. check weight daily then take torsemide if weight gain > 3 lbs.  3. Notify provider if edema or shortness of breath not improved or worsen.

## 2019-07-10 NOTE — Progress Notes (Signed)
This service is provided via telemedicine  No vital signs collected/recorded due to the encounter was a telemedicine visit.   Location of patient (ex: home, work):  Home   Patient consents to a telephone visit:  Yes  Location of the provider (ex: office, home):  Office   Name of any referring provider: Sherrie Mustache NP   Names of all persons participating in the telemedicine service and their role in the encounter:    NP, Ruthell Rummage CMA, Lowella Bandy   Time spent on call:  Ruthell Rummage CMA spent 12 minutes on phone with patient     [facility] clinic  Provider: Marlowe Sax, NP   Code Status: FULL Goals of Care:  Advanced Directives 06/22/2019  Does Patient Have a Medical Advance Directive? No  Does patient want to make changes to medical advance directive? -  Would patient like information on creating a medical advance directive? -     Chief Complaint  Patient presents with  . Acute Visit    Complains of Swellingof left ankle     HPI: Patient is a 85 y.o. male seen today for an acute visit for left leg swelling.He states was seen by Clay at [facility] for follow up left ankle pain and swelling.edema to both legs was noted and patient was told to follow up with her PCP.he has compression stockings but not wearing them due to left leg boot that he wears.He states has had shortness of breath with exertion.He denies any cough or wheezing,fever or chills.He is status post steroid injection of left ankle by [facility] podiatrist in [place].He states injection did not help with the pain.   Past Medical History:  Diagnosis Date  . Acute bronchitis 09/12/2012  . 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 Bone metastases ([place])    left mid tibial shaft  . Cataract    Dr.Groat  . Cellulitis and abscess of leg, except foot 01/17/2012  . Chest pain, unspecified 04/28/2004  . Closed fracture of five ribs 03/24/2004  . First degree  atrioventricular block 04/25/2007  . Gross hematuria 09/06/2011  . History of radiation therapy 04/03/14- 04/17/14   mid to distal left tibia 3000 cGy in 10 sessions  . HTN (hypertension)   . Hx of radiation therapy 11/1998   prostate fossa - 6040 cGy, 33 fx, Dr Danny Lawless  . Osteoarthrosis involving, or with mention of more than one site, but not specified as generalized, multiple sites 10/22/2010  . Other abnormal blood chemistry 04/29/1991  . Other and unspecified hyperlipidemia 01/02/2013  . Palpitations 04/28/2004  . Prostate cancer ([place]) 12/16/1994   gleason 7  . Reflux esophagitis 05/10/2008  . Routine general medical examination at a health care facility   . Spinal stenosis, unspecified region other than cervical 04/29/1995  . Type II or unspecified type diabetes mellitus without mention of complication, uncontrolled     Past Surgical History:  Procedure Laterality Date  . AIR/FLUID EXCHANGE Left 01/09/2016   Procedure: AIR/FLUID EXCHANGE;  Surgeon: Jalene Mullet, MD;  Location: [place];  Service: Ophthalmology;  Laterality: Left;  . CYSTOSCOPY W/ URETERAL STENT PLACEMENT  12/31/14  . CYSTOSCOPY W/ URETERAL STENT PLACEMENT  05/16/2015  . EYE SURGERY  2006   cataract, Dr Shanon Rosser  . LASER PHOTO ABLATION Left 01/09/2016   Procedure: LASER PHOTO ABLATION;  Surgeon: Jalene Mullet, MD;  Location: [place];  Service: Ophthalmology;  Laterality: Left;  Endolaser  . Left ureteral stent placement  Week of 12/05/11  .  PARS PLANA VITRECTOMY Left 01/09/2016   Procedure: PARS PLANA VITRECTOMY WITH 25 GAUGE LEFT EYE ;  Surgeon: Jalene Mullet, MD;  Location: [place];  Service: Ophthalmology;  Laterality: Left;  . PERFLUORONE INJECTION Left 01/09/2016   Procedure: PERFLUORONE INJECTION;  Surgeon: Jalene Mullet, MD;  Location: [place];  Service: Ophthalmology;  Laterality: Left;  . PROSTATECTOMY  1996   Dr. Rosana Hoes    Allergies  Allergen Reactions  . Abiraterone Nausea And Vomiting and Other (See Comments)     Other reaction(s): Increased Heart Rate (intolerance)     Outpatient Encounter Medications as of 07/10/2019  Medication Sig  . acetaminophen (TYLENOL) 650 MG CR tablet Take 650 mg by mouth as needed for pain.  Marland Kitchen allopurinol (ZYLOPRIM) 100 MG tablet Take 1 tablet (100 mg total) by mouth daily.  Marland Kitchen aspirin 81 MG tablet Take 1 tablet (81 mg total) by mouth daily.  . calcium carbonate (TUMS - DOSED IN MG ELEMENTAL CALCIUM) 500 MG chewable tablet Chew 1 tablet by mouth as needed for indigestion or heartburn.  . Cholecalciferol 1000 UNITS tablet Take 1,000 Units by mouth daily.  Marland Kitchen doxazosin (CARDURA) 4 MG tablet Take 1 tablet (4 mg total) by mouth daily.  . enzalutamide (XTANDI) 40 MG capsule Take 120 mg by mouth daily.   Marland Kitchen glucose blood (ONETOUCH VERIO) test strip Check blood sugar one to two times a week. Dx:E11.29  . leuprolide, 6 Month, (LEUPROLIDE ACETATE, 6 MONTH,) 45 MG injection Inject 45 mg into the skin every 6 (six) months. Filled by [facility]  . lisinopril-hydrochlorothiazide (ZESTORETIC) 20-12.5 MG tablet Take 2 tablets by mouth daily.  . metFORMIN (GLUCOPHAGE) 500 MG tablet Take one tablet by mouth once daily with breakfast to control blood sugar  . metoprolol tartrate (LOPRESSOR) 50 MG tablet Take 1 tablet (50 mg total) by mouth 2 (two) times daily.  . Omega-3 Fatty Acids (FISH OIL) 1000 MG CAPS Take 1 capsule by mouth daily.  . predniSONE (DELTASONE) 20 MG tablet Take 1 tablet (20 mg total) by mouth daily with breakfast.  . pyridoxine (B-6) 100 MG tablet Take 100 mg by mouth daily.  . simvastatin (ZOCOR) 20 MG tablet Take 1 tablet (20 mg total) by mouth daily.  . traMADol (ULTRAM) 50 MG tablet TAKE 1 TABLET BY MOUTH EVERY 6 HOURS AS NEEDED FOR UP TO 5 DAYS.  Marland Kitchen Zoster Vaccine Adjuvanted [facility]) injection Inject 0.5 mLs into the muscle once.   No facility-administered encounter medications on file as of 07/10/2019.     Review of Systems:  Review of Systems  Constitutional:  Negative for chills, fatigue and fever.  HENT: Negative for congestion, rhinorrhea, sinus pressure, sinus pain, sneezing and sore throat.   Respiratory: Negative for cough, chest tightness and wheezing.        Shortness of breath with exertion   Cardiovascular: Positive for leg swelling. Negative for chest pain and palpitations.  Gastrointestinal: Negative for abdominal distention, abdominal pain, nausea and vomiting.  Musculoskeletal: Positive for arthralgias.       Left ankle pain   Skin: Negative for color change, pallor and rash.  Neurological: Negative for dizziness, light-headedness and headaches.       Chronic numbness and tingling on the legs.     Health Maintenance  Topic Date Due  . INFLUENZA VACCINE  06/16/2019  . HEMOGLOBIN A1C  08/23/2019  . OPHTHALMOLOGY EXAM  09/23/2019  . FOOT EXAM  06/26/2020  . TETANUS/TDAP  11/08/2023  . PNA vac Low Risk  Adult  Completed    Physical Exam: There were no vitals filed for this visit. There is no height or weight on file to calculate BMI. Physical Exam Unable to complete on telephone visit   Labs reviewed: Basic Metabolic Panel: Recent Labs    11/21/18 0825 02/21/19 0824 06/18/19 0809  NA 140 139 141  K 4.5 4.3 4.5  CL 105 107 108  CO2 26 25 24   GLUCOSE 126* 128* 125*  BUN 25 25 29*  CREATININE 1.21* 1.13* 1.18*  CALCIUM 8.8 8.6 8.8   Liver Function Tests: Recent Labs    11/08/18 1007 11/21/18 0825 02/21/19 0824 06/18/19 0809  AST 17 11 11 11   ALT 12 8* 8* 8*  ALKPHOS 70  --   --   --   BILITOT 0.5 0.5 0.5 0.4  PROT 6.1* 5.9* 5.9* 5.8*  ALBUMIN 3.1*  --   --   --    No results for input(s): LIPASE, AMYLASE in the last 8760 hours. No results for input(s): AMMONIA in the last 8760 hours. CBC: Recent Labs    11/21/18 0825 05/09/19 1602 06/18/19 0809  WBC 9.3 12.8* 8.1  NEUTROABS 5,813 8,973* 5,119  HGB 12.7* 12.1* 11.3*  HCT 37.2* 34.5* 33.2*  MCV 84.2 83.3 85.3  PLT 210 195 193   Lipid Panel:  Recent Labs    11/21/18 0825 02/21/19 0824 06/18/19 0809  CHOL 186 160 134  HDL 43 45 38*  LDLCALC 119* 97 80  TRIG 125 88 80  CHOLHDL 4.3 3.6 3.5   Lab Results  Component Value Date   HGBA1C 5.6 02/21/2019    Procedures since last visit: Dg Ankle Complete Left  Result Date: 06/27/2019 Please see detailed radiograph report in office note.   Assessment/Plan 1. Peripheral edema Seen by Orthopedic prior to visit told to see PCP for leg swelling.Has had shortness of breath with exertion.  Start on torsemide 10 mg tablet one by mouth daily x 3 days then change to one by mouth as needed for worsening leg edema ,cough or shortness of breath or abrupt weight gain. - check weight daily then take torsemide if weight gain > 3 lbs.  - Notify provider if edema or shortness of breath not improved or worsen.  2. Shortness of breath Start on Torsemide as above. Notify provider if edema or shortness of breath not improved or worsen.  3. Chronic pain of left ankle Take Tylenol 650 mg tablet one by mouth in the morning and at bedtime as needed for pain.  Labs/tests ordered: BMP in 4 weeks.  Next appt:  09/19/2019 with Sherrie Mustache NP

## 2019-07-10 NOTE — Telephone Encounter (Signed)
Seen Dr Lanae Boast) in [facility] this am, he put pt in boot for support. He suggested he needs another or additional fluid pill due to swelling/edema in left ankle.  Dr Jerline Pain suggested Mr Readus to contact PCP for advice.  Thanks,  Vilinda Blanks.

## 2019-07-13 ENCOUNTER — Telehealth: Payer: Self-pay | Admitting: Cardiology

## 2019-07-13 NOTE — Telephone Encounter (Signed)
Pt has ankle pain from wound that is being treated.  Needs walking assistance.  OK for wife to attend appointment as long as she passes screening and masks.

## 2019-07-13 NOTE — Telephone Encounter (Signed)
  Wife states she will be coming with patient to his appt on 07/17/19 to assist him with walking.

## 2019-07-17 ENCOUNTER — Ambulatory Visit (INDEPENDENT_AMBULATORY_CARE_PROVIDER_SITE_OTHER): Payer: Medicare Other | Admitting: Cardiology

## 2019-07-17 ENCOUNTER — Other Ambulatory Visit: Payer: Self-pay

## 2019-07-17 ENCOUNTER — Encounter: Payer: Self-pay | Admitting: Cardiology

## 2019-07-17 VITALS — BP 124/60 | HR 56 | Ht 71.0 in | Wt 278.0 lb

## 2019-07-17 DIAGNOSIS — I420 Dilated cardiomyopathy: Secondary | ICD-10-CM

## 2019-07-17 DIAGNOSIS — E782 Mixed hyperlipidemia: Secondary | ICD-10-CM

## 2019-07-17 DIAGNOSIS — I6523 Occlusion and stenosis of bilateral carotid arteries: Secondary | ICD-10-CM

## 2019-07-17 DIAGNOSIS — I059 Rheumatic mitral valve disease, unspecified: Secondary | ICD-10-CM

## 2019-07-17 DIAGNOSIS — I3481 Nonrheumatic mitral (valve) annulus calcification: Secondary | ICD-10-CM

## 2019-07-17 DIAGNOSIS — I10 Essential (primary) hypertension: Secondary | ICD-10-CM

## 2019-07-17 MED ORDER — ROSUVASTATIN CALCIUM 20 MG PO TABS: 20.0000 mg | ORAL_TABLET | Freq: Every day | ORAL | 3 refills | Status: DC

## 2019-07-17 NOTE — Progress Notes (Signed)
Cardiology Office Note:    Date:  07/17/2019   ID:  Jeremiah Collins, DOB 07-04-1934, MRN [id number]  PCP:  Lauree Chandler, NP  Cardiologist:  Candee Furbish, MD  Electrophysiologist:  None   Referring MD: Lauree Chandler, NP     History of Present Illness:    Jeremiah Collins is a 85 y.o. male last seen by Truitt Merle on 09/06/2017 as well as Dr. Quay Burow with bilateral carotid artery disease here for follow-up of prior abnormality on echocardiogram.  Back in March 2017 and echocardiogram demonstrated calcified mass at the mitral valve, this was noted to be dense mitral annular calcification.  No prior stroke MRI.  Theoretically mitral annular calcification as well as aortic valve calcification could release very small particles of calcium causing this retinal malformation observed by the ophthalmologist.  Statin increase was advised but he decided not to go forward with this.  He has metastatic prostate cancer diabetes hypertension hyperlipidemia anemia chronic kidney disease.  Has some disequilibrium.  This was his primary complaint back in 2018.  Seen by primary provider with peripheral edema, torsemide 10 mg tablet was administered.  Shortness of breath was noted.    Past Medical History:  Diagnosis Date  . Acute bronchitis 09/12/2012  . 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 Bone metastases ([place])    left mid tibial shaft  . Cataract    Dr.Groat  . Cellulitis and abscess of leg, except foot 01/17/2012  . Chest pain, unspecified 04/28/2004  . Closed fracture of five ribs 03/24/2004  . First degree atrioventricular block 04/25/2007  . Gross hematuria 09/06/2011  . History of radiation therapy 04/03/14- 04/17/14   mid to distal left tibia 3000 cGy in 10 sessions  . HTN (hypertension)   . Hx of radiation therapy 11/1998   prostate fossa - 6040 cGy, 33 fx, Dr Danny Lawless  . Osteoarthrosis involving, or with mention of more than one site, but not specified as generalized,  multiple sites 10/22/2010  . Other abnormal blood chemistry 04/29/1991  . Other and unspecified hyperlipidemia 01/02/2013  . Palpitations 04/28/2004  . Prostate cancer ([place]) 12/16/1994   gleason 7  . Reflux esophagitis 05/10/2008  . Routine general medical examination at a health care facility   . Spinal stenosis, unspecified region other than cervical 04/29/1995  . Type II or unspecified type diabetes mellitus without mention of complication, uncontrolled     Past Surgical History:  Procedure Laterality Date  . AIR/FLUID EXCHANGE Left 01/09/2016   Procedure: AIR/FLUID EXCHANGE;  Surgeon: Jalene Mullet, MD;  Location: [place];  Service: Ophthalmology;  Laterality: Left;  . CYSTOSCOPY W/ URETERAL STENT PLACEMENT  12/31/14  . CYSTOSCOPY W/ URETERAL STENT PLACEMENT  05/16/2015  . EYE SURGERY  2006   cataract, Dr Shanon Rosser  . LASER PHOTO ABLATION Left 01/09/2016   Procedure: LASER PHOTO ABLATION;  Surgeon: Jalene Mullet, MD;  Location: [place] [facility];  Service: Ophthalmology;  Laterality: Left;  Endolaser  . Left ureteral stent placement  Week of 12/05/11  . PARS PLANA VITRECTOMY Left 01/09/2016   Procedure: PARS PLANA VITRECTOMY WITH 25 GAUGE LEFT EYE ;  Surgeon: Jalene Mullet, MD;  Location: [place];  Service: Ophthalmology;  Laterality: Left;  . PERFLUORONE INJECTION Left 01/09/2016   Procedure: PERFLUORONE INJECTION;  Surgeon: Jalene Mullet, MD;  Location: [place];  Service: Ophthalmology;  Laterality: Left;  . PROSTATECTOMY  1996   Dr. Rosana Hoes    Current Medications: Current Meds  Medication Sig  . acetaminophen (TYLENOL) 650  MG CR tablet Take 650 mg by mouth as needed for pain.  Marland Kitchen allopurinol (ZYLOPRIM) 100 MG tablet Take 1 tablet (100 mg total) by mouth daily.  Marland Kitchen aspirin 81 MG tablet Take 1 tablet (81 mg total) by mouth daily.  . calcium carbonate (TUMS - DOSED IN MG ELEMENTAL CALCIUM) 500 MG chewable tablet Chew 1 tablet by mouth as needed for indigestion or heartburn.  . Cholecalciferol 1000 UNITS  tablet Take 1,000 Units by mouth daily.  Marland Kitchen doxazosin (CARDURA) 4 MG tablet Take 1 tablet (4 mg total) by mouth daily.  . enzalutamide (XTANDI) 40 MG capsule Take 120 mg by mouth daily.   Marland Kitchen glucose blood (ONETOUCH VERIO) test strip Check blood sugar one to two times a week. Dx:E11.29  . leuprolide, 6 Month, (LEUPROLIDE ACETATE, 6 MONTH,) 45 MG injection Inject 45 mg into the skin every 6 (six) months. Filled by [facility]  . lisinopril-hydrochlorothiazide (ZESTORETIC) 20-12.5 MG tablet Take 2 tablets by mouth daily.  . metFORMIN (GLUCOPHAGE) 500 MG tablet Take one tablet by mouth once daily with breakfast to control blood sugar  . metoprolol tartrate (LOPRESSOR) 50 MG tablet Take 1 tablet (50 mg total) by mouth 2 (two) times daily.  . Omega-3 Fatty Acids (FISH OIL) 1000 MG CAPS Take 1 capsule by mouth daily.  Marland Kitchen pyridoxine (B-6) 100 MG tablet Take 100 mg by mouth daily.  Marland Kitchen torsemide (DEMADEX) 10 MG tablet Take 1 tablet (10 mg total) by mouth daily for 3 days. Then take one by mouth daily as needed for leg swelling,shortness of breath or abrupt weight gain > 3 lbs.  . Zoster Vaccine Adjuvanted [facility]) injection Inject 0.5 mLs into the muscle once.  . [DISCONTINUED] simvastatin (ZOCOR) 20 MG tablet Take 1 tablet (20 mg total) by mouth daily.     Allergies:   Abiraterone   Social History   Socioeconomic History  . Marital status: Married    Spouse name: Not on file  . Number of children: Not on file  . Years of education: Not on file  . Highest education level: Not on file  Occupational History  . Not on file  Social Needs  . Financial resource strain: Not hard at all  . Food insecurity    Worry: Never true    Inability: Never true  . Transportation needs    Medical: No    Non-medical: No  Tobacco Use  . Smoking status: Former Smoker    Years: 5.00    Types: Cigars    Quit date: 12/16/1968    Years since quitting: 50.6  . Smokeless tobacco: Never Used  Substance and Sexual  Activity  . Alcohol use: No    Alcohol/week: 0.0 standard drinks  . Drug use: No  . Sexual activity: Never  Lifestyle  . Physical activity    Days per week: 0 days    Minutes per session: 0 min  . Stress: Not at all  Relationships  . Social Herbalist on phone: Three times a week    Gets together: Once a week    Attends religious service: Never    Active member of club or organization: No    Attends meetings of clubs or organizations: Never    Relationship status: Married  Other Topics Concern  . Not on file  Social History Narrative  . Not on file     Family History: The patient's family history includes Heart disease in his father; Stroke in his sister.  ROS:   Please see the history of present illness.    Denies any fevers chills nausea vomiting syncope bleeding all other systems reviewed and are negative.  EKGs/Labs/Other Studies Reviewed:    The following studies were reviewed today:  ECHO 2018  - Left ventricle: The cavity size was normal. There was moderate   focal basal hypertrophy of the septum with otherwise mild   concentric hypertrophy. Systolic function was normal. The   estimated ejection fraction was in the range of 60% to 65%. Wall   motion was normal; there were no regional wall motion   abnormalities. Doppler parameters are consistent with abnormal   left ventricular relaxation (grade 1 diastolic dysfunction).   Doppler parameters are consistent with high ventricular filling   pressure. - Aortic valve: Valve mobility was restricted. There was very mild   stenosis. There was no regurgitation. Mean gradient (S): 10 mm   Hg. Peak gradient (S): 17 mm Hg. - Aorta: Ascending aortic diameter: 40 mm (S). - Ascending aorta: The ascending aorta was mildly dilated. - Mitral valve: Moderately calcified annulus, mostly posterior.   Transvalvular velocity was within the normal range. There was no   evidence for stenosis. There was trivial  regurgitation. - Right ventricle: The cavity size was normal. Wall thickness was   normal. Systolic function was normal. - Atrial septum: No defect or patent foramen ovale was identified   by color flow Doppler. - Tricuspid valve: There was trivial regurgitation. - Pulmonary arteries: Systolic pressure was within the normal   range. PA peak pressure: 23 mm Hg (S).  Carotid vascular disease 2018   Final Interpretation: Right Carotid: There is evidence in the right ICA of a higher end of 1-39%                stenosis.  Left Carotid: There is evidence in the left ICA of a 1-39% stenosis.               Non-hemodynamically significant plaque noted in the CCA.  Vertebrals:  Both vertebral arteries were patent with antegrade flow. Subclavians: Normal flow hemodynamics were seen in bilateral subclavian              arteries.   EKG:  EKG is  ordered today.  The ekg ordered today demonstrates 07/17/2019- sinus bradycardia 56 with nonspecific ST-T wave changes, borderline LVH-prior 11/08/2018-sinus rhythm 63 with nonspecific ST-T wave changes  Recent Labs: 06/18/2019: ALT 8; BUN 29; Creat 1.18; Hemoglobin 11.3; Platelets 193; Potassium 4.5; Sodium 141  Recent Lipid Panel    Component Value Date/Time   CHOL 134 06/18/2019 0809   CHOL 148 04/21/2016 0804   TRIG 80 06/18/2019 0809   HDL 38 (L) 06/18/2019 0809   HDL 40 04/21/2016 0804   CHOLHDL 3.5 06/18/2019 0809   VLDL 17 06/06/2017 0816   LDLCALC 80 06/18/2019 0809    Physical Exam:    VS:  BP 124/60   Pulse (!) 56   Ht 5\' 11"  (1.803 m)   Wt 278 lb (126.1 kg)   SpO2 96%   BMI 38.77 kg/m     Wt Readings from Last 3 Encounters:  07/17/19 278 lb (126.1 kg)  06/22/19 269 lb (122 kg)  05/09/19 271 lb (122.9 kg)     GEN: Overweight well nourished, well developed in no acute distress HEENT: Normal NECK: No JVD; No carotid bruits LYMPHATICS: No lymphadenopathy CARDIAC: RRR, 2/6 murmur right upper sternal border, no rubs,  gallops RESPIRATORY:  Clear  to auscultation without rales, wheezing or rhonchi  ABDOMEN: Soft, non-tender, non-distended MUSCULOSKELETAL:  No edema; No deformity boot on left foot SKIN: Warm and dry NEUROLOGIC:  Alert and oriented x 3 PSYCHIATRIC:  Normal affect   ASSESSMENT:    1. Mitral annular calcification   2. Essential hypertension, benign   3. Congestive dilated cardiomyopathy ([place])   4. Bilateral carotid artery stenosis   5. Mixed hyperlipidemia    PLAN:    In order of problems listed above:  Mitral annular calcification/aortic stenosis-mild in 2018 - Dense mitral annular calcification, common to see especially in the aging population. -We will repeat echocardiogram, aortic stenosis murmur appreciated on exam.  Carotid disease -Carotid Dopplers reviewed from 2018. - Mild disease bilaterally.  No need to repeat.  Secondary prevention.  Prostate cancer -Metastatic, [place]  Obesity -Continue encourage weight loss  Hyperlipidemia -On simvastatin 20 mg.  I will change over to Crestor 20 mg, high intensity statin.  Less drug drug interaction when compared to simvastatin.  He may continue to get his blood work checked by primary care provider Dr. Dewaine Oats.  Lower extremity edema - Agree with current plan.  One year follow-up with Truitt Merle, 2 years with mw  Medication Adjustments/Labs and Tests Ordered: Current medicines are reviewed at length with the patient today.  Concerns regarding medicines are outlined above.  Orders Placed This Encounter  Procedures  . EKG 12-Lead  . ECHOCARDIOGRAM COMPLETE   Meds ordered this encounter  Medications  . rosuvastatin (CRESTOR) 20 MG tablet    Sig: Take 1 tablet (20 mg total) by mouth daily.    Dispense:  90 tablet    Refill:  3    Patient Instructions  Medication Instructions. Your physician has recommended you make the following change in your medication:  STOP SIMVASTATIN START CRESTOR 20 MG DAILY  If you need  a refill on your cardiac medications before your next appointment, please call your pharmacy.   Lab work: None Ordered If you have labs (blood work) drawn today and your tests are completely normal, you will receive your results only by: Marland Kitchen MyChart Message (if you have MyChart) OR . A paper copy in the mail If you have any lab test that is abnormal or we need to change your treatment, we will call you to review the results.  Testing/Procedures: Your physician has requested that you have an echocardiogram. Echocardiography is a painless test that uses sound waves to create images of your heart. It provides your doctor with information about the size and shape of your heart and how well your heart's chambers and valves are working. This procedure takes approximately one hour. There are no restrictions for this procedure.   Follow-Up: At [facility], you and your health needs are our priority.  As part of our continuing mission to provide you with exceptional heart care, we have created designated Provider Care Teams.  These Care Teams include your primary Cardiologist (physician) and Advanced Practice Providers (APPs -  Physician Assistants and Nurse Practitioners) who all work together to provide you with the care you need, when you need it. You will need a follow up appointment in 1 years with Truitt Merle, NP and Follow up You may see Candee Furbish, MD in 2 yrs    Any Other Special Instructions Will Be Listed Below (If Applicable).     Echocardiogram An echocardiogram is a procedure that uses painless sound waves (ultrasound) to produce an image of the heart. Images from  an echocardiogram can provide important information about: Signs of coronary artery disease (CAD). Aneurysm detection. An aneurysm is a weak or damaged part of an artery wall that bulges out from the normal force of blood pumping through the body. Heart size and shape. Changes in the size or shape of the heart can be  associated with certain conditions, including heart failure, aneurysm, and CAD. Heart muscle function. Heart valve function. Signs of a past heart attack. Fluid buildup around the heart. Thickening of the heart muscle. A tumor or infectious growth around the heart valves. Tell a health care provider about: Any allergies you have. All medicines you are taking, including vitamins, herbs, eye drops, creams, and over-the-counter medicines. Any blood disorders you have. Any surgeries you have had. Any medical conditions you have. Whether you are pregnant or may be pregnant. What are the risks? Generally, this is a safe procedure. However, problems may occur, including: Allergic reaction to dye (contrast) that may be used during the procedure. What happens before the procedure? No specific preparation is needed. You may eat and drink normally. What happens during the procedure?  An IV tube may be inserted into one of your veins. You may receive contrast through this tube. A contrast is an injection that improves the quality of the pictures from your heart. A gel will be applied to your chest. A wand-like tool (transducer) will be moved over your chest. The gel will help to transmit the sound waves from the transducer. The sound waves will harmlessly bounce off of your heart to allow the heart images to be captured in real-time motion. The images will be recorded on a computer. The procedure may vary among health care providers and hospitals. What happens after the procedure? You may return to your normal, everyday life, including diet, activities, and medicines, unless your health care provider tells you not to do that. Summary An echocardiogram is a procedure that uses painless sound waves (ultrasound) to produce an image of the heart. Images from an echocardiogram can provide important information about the size and shape of your heart, heart muscle function, heart valve function, and  fluid buildup around your heart. You do not need to do anything to prepare before this procedure. You may eat and drink normally. After the echocardiogram is completed, you may return to your normal, everyday life, unless your health care provider tells you not to do that. This information is not intended to replace advice given to you by your health care provider. Make sure you discuss any questions you have with your health care provider. Document Released: 10/29/2000 Document Revised: 02/22/2019 Document Reviewed: 12/04/2016 Elsevier Patient Education  2020 [employer].     Signed, Candee Furbish, MD  07/17/2019 8:39 AM    [employer]

## 2019-07-17 NOTE — Patient Instructions (Addendum)
Medication Instructions. Your physician has recommended you make the following change in your medication:  STOP SIMVASTATIN START CRESTOR 20 MG DAILY  If you need a refill on your cardiac medications before your next appointment, please call your pharmacy.   Lab work: None Ordered If you have labs (blood work) drawn today and your tests are completely normal, you will receive your results only by: Marland Kitchen MyChart Message (if you have MyChart) OR . A paper copy in the mail If you have any lab test that is abnormal or we need to change your treatment, we will call you to review the results.  Testing/Procedures: Your physician has requested that you have an echocardiogram. Echocardiography is a painless test that uses sound waves to create images of your heart. It provides your doctor with information about the size and shape of your heart and how well your heart's chambers and valves are working. This procedure takes approximately one hour. There are no restrictions for this procedure.   Follow-Up: At [facility], you and your health needs are our priority.  As part of our continuing mission to provide you with exceptional heart care, we have created designated Provider Care Teams.  These Care Teams include your primary Cardiologist (physician) and Advanced Practice Providers (APPs -  Physician Assistants and Nurse Practitioners) who all work together to provide you with the care you need, when you need it. You will need a follow up appointment in 1 years with Truitt Merle, NP and Follow up You may see Candee Furbish, MD in 2 yrs    Any Other Special Instructions Will Be Listed Below (If Applicable).     Echocardiogram An echocardiogram is a procedure that uses painless sound waves (ultrasound) to produce an image of the heart. Images from an echocardiogram can provide important information about: Signs of coronary artery disease (CAD). Aneurysm detection. An aneurysm is a weak or damaged  part of an artery wall that bulges out from the normal force of blood pumping through the body. Heart size and shape. Changes in the size or shape of the heart can be associated with certain conditions, including heart failure, aneurysm, and CAD. Heart muscle function. Heart valve function. Signs of a past heart attack. Fluid buildup around the heart. Thickening of the heart muscle. A tumor or infectious growth around the heart valves. Tell a health care provider about: Any allergies you have. All medicines you are taking, including vitamins, herbs, eye drops, creams, and over-the-counter medicines. Any blood disorders you have. Any surgeries you have had. Any medical conditions you have. Whether you are pregnant or may be pregnant. What are the risks? Generally, this is a safe procedure. However, problems may occur, including: Allergic reaction to dye (contrast) that may be used during the procedure. What happens before the procedure? No specific preparation is needed. You may eat and drink normally. What happens during the procedure?  An IV tube may be inserted into one of your veins. You may receive contrast through this tube. A contrast is an injection that improves the quality of the pictures from your heart. A gel will be applied to your chest. A wand-like tool (transducer) will be moved over your chest. The gel will help to transmit the sound waves from the transducer. The sound waves will harmlessly bounce off of your heart to allow the heart images to be captured in real-time motion. The images will be recorded on a computer. The procedure may vary among health care providers and hospitals.  What happens after the procedure? You may return to your normal, everyday life, including diet, activities, and medicines, unless your health care provider tells you not to do that. Summary An echocardiogram is a procedure that uses painless sound waves (ultrasound) to produce an image of  the heart. Images from an echocardiogram can provide important information about the size and shape of your heart, heart muscle function, heart valve function, and fluid buildup around your heart. You do not need to do anything to prepare before this procedure. You may eat and drink normally. After the echocardiogram is completed, you may return to your normal, everyday life, unless your health care provider tells you not to do that. This information is not intended to replace advice given to you by your health care provider. Make sure you discuss any questions you have with your health care provider. Document Released: 10/29/2000 Document Revised: 02/22/2019 Document Reviewed: 12/04/2016 Elsevier Patient Education  2020 [employer].

## 2019-07-19 DIAGNOSIS — T84498A Other mechanical complication of other internal orthopedic devices, implants and grafts, initial encounter: Secondary | ICD-10-CM | POA: Diagnosis not present

## 2019-07-19 DIAGNOSIS — M84462S Pathological fracture, left tibia, sequela: Secondary | ICD-10-CM | POA: Diagnosis not present

## 2019-07-19 DIAGNOSIS — Z967 Presence of other bone and tendon implants: Secondary | ICD-10-CM | POA: Diagnosis not present

## 2019-07-21 ENCOUNTER — Other Ambulatory Visit: Payer: Self-pay | Admitting: Nurse Practitioner

## 2019-07-21 DIAGNOSIS — I10 Essential (primary) hypertension: Secondary | ICD-10-CM

## 2019-07-21 DIAGNOSIS — I517 Cardiomegaly: Secondary | ICD-10-CM

## 2019-07-24 DIAGNOSIS — C7951 Secondary malignant neoplasm of bone: Secondary | ICD-10-CM | POA: Diagnosis not present

## 2019-07-24 DIAGNOSIS — C61 Malignant neoplasm of prostate: Secondary | ICD-10-CM | POA: Diagnosis not present

## 2019-07-25 ENCOUNTER — Other Ambulatory Visit: Payer: Self-pay

## 2019-07-25 ENCOUNTER — Ambulatory Visit (INDEPENDENT_AMBULATORY_CARE_PROVIDER_SITE_OTHER): Payer: Medicare Other | Admitting: Podiatry

## 2019-07-25 DIAGNOSIS — R600 Localized edema: Secondary | ICD-10-CM

## 2019-07-25 DIAGNOSIS — M659 Synovitis and tenosynovitis, unspecified: Secondary | ICD-10-CM

## 2019-07-26 ENCOUNTER — Ambulatory Visit (HOSPITAL_COMMUNITY): Payer: Medicare Other | Attending: Internal Medicine

## 2019-07-26 DIAGNOSIS — I3481 Nonrheumatic mitral (valve) annulus calcification: Secondary | ICD-10-CM

## 2019-07-26 DIAGNOSIS — I059 Rheumatic mitral valve disease, unspecified: Secondary | ICD-10-CM | POA: Insufficient documentation

## 2019-07-26 DIAGNOSIS — I420 Dilated cardiomyopathy: Secondary | ICD-10-CM | POA: Insufficient documentation

## 2019-07-28 NOTE — Progress Notes (Signed)
   Subjective: 85 year old male presenting today for follow up evaluation of left ankle pain and swelling as well as a wound noted to the posterior left leg. He states the wound looks better but he is still concerned about the swelling of the left ankle. He has been using the post op shoe as directed. There are no modifying factors noted. Patient is here for further evaluation and treatment.    Past Medical History:  Diagnosis Date  . Acute bronchitis 09/12/2012  . 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 Bone metastases ([place])    left mid tibial shaft  . Cataract    Dr.Groat  . Cellulitis and abscess of leg, except foot 01/17/2012  . Chest pain, unspecified 04/28/2004  . Closed fracture of five ribs 03/24/2004  . First degree atrioventricular block 04/25/2007  . Gross hematuria 09/06/2011  . History of radiation therapy 04/03/14- 04/17/14   mid to distal left tibia 3000 cGy in 10 sessions  . HTN (hypertension)   . Hx of radiation therapy 11/1998   prostate fossa - 6040 cGy, 33 fx, Dr Danny Lawless  . Osteoarthrosis involving, or with mention of more than one site, but not specified as generalized, multiple sites 10/22/2010  . Other abnormal blood chemistry 04/29/1991  . Other and unspecified hyperlipidemia 01/02/2013  . Palpitations 04/28/2004  . Prostate cancer ([place]) 12/16/1994   gleason 7  . Reflux esophagitis 05/10/2008  . Routine general medical examination at a health care facility   . Spinal stenosis, unspecified region other than cervical 04/29/1995  . Type II or unspecified type diabetes mellitus without mention of complication, uncontrolled      Objective/Physical Exam General: The patient is alert and oriented x3 in no acute distress.  Dermatology:  Wound noted to the posterior left leg has healed. Complete re-epithelialization has occurred. No drainage noted.  Skin is warm, dry and supple bilateral lower extremities.  Vascular: Palpable pedal pulses bilaterally. Mild edema noted.  Capillary refill within normal limits. Varicosities noted bilateral lower extremities.   Neurological: Epicritic and protective threshold diminished bilaterally.   Musculoskeletal Exam: Left ankle edema noted. Pain with palpation noted to the anterior, medial and lateral aspects of the left ankle. Range of motion within normal limits to all pedal and ankle joints bilateral. Muscle strength 5/5 in all groups bilateral.   Assessment: #1 ulceration noted to the posterior left leg secondary to venous insufficiency - resolved  #2 varicosities bilateral lower extremities #3 Left ankle edema #4 h/o left leg ORIF  Plan of Care:  #1 Patient was evaluated. #2 Unna boot applied. Keep clean, dry and intact for one week.  #3 Compression anklet dispensed to use after unna boot.  #4 Return to clinic as needed.    Edrick Kins, DPM [facility]  Dr. Edrick Kins, [address]                                        [place],  [address]                Office [phone]  Fax [phone]

## 2019-08-07 ENCOUNTER — Other Ambulatory Visit: Payer: Self-pay

## 2019-08-07 ENCOUNTER — Other Ambulatory Visit: Payer: Medicare Other

## 2019-08-07 DIAGNOSIS — R609 Edema, unspecified: Secondary | ICD-10-CM | POA: Diagnosis not present

## 2019-08-07 DIAGNOSIS — R0602 Shortness of breath: Secondary | ICD-10-CM

## 2019-08-07 LAB — BASIC METABOLIC PANEL WITH GFR
BUN/Creatinine Ratio: 19 (calc) (ref 6–22)
BUN: 22 mg/dL (ref 7–25)
CO2: 26 mmol/L (ref 20–32)
Calcium: 9 mg/dL (ref 8.6–10.3)
Chloride: 107 mmol/L (ref 98–110)
Creat: 1.17 mg/dL — ABNORMAL HIGH (ref 0.70–1.11)
GFR, Est African American: 67 mL/min/{1.73_m2} (ref >=60)
GFR, Est Non African American: 58 mL/min/{1.73_m2} — ABNORMAL LOW (ref >=60)
Glucose, Bld: 129 mg/dL — ABNORMAL HIGH (ref 65–99)
Potassium: 4.4 mmol/L (ref 3.5–5.3)
Sodium: 140 mmol/L (ref 135–146)

## 2019-08-23 DIAGNOSIS — D485 Neoplasm of uncertain behavior of skin: Secondary | ICD-10-CM | POA: Diagnosis not present

## 2019-08-23 DIAGNOSIS — C44222 Squamous cell carcinoma of skin of right ear and external auricular canal: Secondary | ICD-10-CM | POA: Diagnosis not present

## 2019-08-23 DIAGNOSIS — C44212 Basal cell carcinoma of skin of right ear and external auricular canal: Secondary | ICD-10-CM | POA: Diagnosis not present

## 2019-08-28 ENCOUNTER — Other Ambulatory Visit: Payer: Self-pay | Admitting: Nurse Practitioner

## 2019-08-28 ENCOUNTER — Other Ambulatory Visit: Payer: Self-pay | Admitting: Family

## 2019-08-28 DIAGNOSIS — I10 Essential (primary) hypertension: Secondary | ICD-10-CM

## 2019-09-04 DIAGNOSIS — Z79899 Other long term (current) drug therapy: Secondary | ICD-10-CM | POA: Diagnosis not present

## 2019-09-04 DIAGNOSIS — C7951 Secondary malignant neoplasm of bone: Secondary | ICD-10-CM | POA: Diagnosis not present

## 2019-09-04 DIAGNOSIS — Z923 Personal history of irradiation: Secondary | ICD-10-CM | POA: Diagnosis not present

## 2019-09-04 DIAGNOSIS — C61 Malignant neoplasm of prostate: Secondary | ICD-10-CM | POA: Diagnosis not present

## 2019-09-04 DIAGNOSIS — Z192 Hormone resistant malignancy status: Secondary | ICD-10-CM | POA: Diagnosis not present

## 2019-09-04 DIAGNOSIS — R2681 Unsteadiness on feet: Secondary | ICD-10-CM | POA: Diagnosis not present

## 2019-09-04 DIAGNOSIS — I10 Essential (primary) hypertension: Secondary | ICD-10-CM | POA: Diagnosis not present

## 2019-09-04 DIAGNOSIS — G629 Polyneuropathy, unspecified: Secondary | ICD-10-CM | POA: Diagnosis not present

## 2019-09-11 DIAGNOSIS — N183 Chronic kidney disease, stage 3 unspecified: Secondary | ICD-10-CM | POA: Diagnosis not present

## 2019-09-11 DIAGNOSIS — D649 Anemia, unspecified: Secondary | ICD-10-CM | POA: Diagnosis not present

## 2019-09-11 DIAGNOSIS — I129 Hypertensive chronic kidney disease with stage 1 through stage 4 chronic kidney disease, or unspecified chronic kidney disease: Secondary | ICD-10-CM | POA: Diagnosis not present

## 2019-09-11 DIAGNOSIS — Z7984 Long term (current) use of oral hypoglycemic drugs: Secondary | ICD-10-CM | POA: Diagnosis not present

## 2019-09-11 DIAGNOSIS — Z20828 Contact with and (suspected) exposure to other viral communicable diseases: Secondary | ICD-10-CM | POA: Diagnosis not present

## 2019-09-11 DIAGNOSIS — E785 Hyperlipidemia, unspecified: Secondary | ICD-10-CM | POA: Diagnosis not present

## 2019-09-11 DIAGNOSIS — Z01818 Encounter for other preprocedural examination: Secondary | ICD-10-CM | POA: Diagnosis not present

## 2019-09-11 DIAGNOSIS — Z87891 Personal history of nicotine dependence: Secondary | ICD-10-CM | POA: Diagnosis not present

## 2019-09-11 DIAGNOSIS — Z01812 Encounter for preprocedural laboratory examination: Secondary | ICD-10-CM | POA: Diagnosis not present

## 2019-09-11 DIAGNOSIS — N320 Bladder-neck obstruction: Secondary | ICD-10-CM | POA: Diagnosis not present

## 2019-09-11 DIAGNOSIS — E1122 Type 2 diabetes mellitus with diabetic chronic kidney disease: Secondary | ICD-10-CM | POA: Diagnosis not present

## 2019-09-18 DIAGNOSIS — I129 Hypertensive chronic kidney disease with stage 1 through stage 4 chronic kidney disease, or unspecified chronic kidney disease: Secondary | ICD-10-CM | POA: Diagnosis not present

## 2019-09-18 DIAGNOSIS — Z87891 Personal history of nicotine dependence: Secondary | ICD-10-CM | POA: Diagnosis not present

## 2019-09-18 DIAGNOSIS — N183 Chronic kidney disease, stage 3 unspecified: Secondary | ICD-10-CM | POA: Diagnosis not present

## 2019-09-18 DIAGNOSIS — N135 Crossing vessel and stricture of ureter without hydronephrosis: Secondary | ICD-10-CM | POA: Diagnosis not present

## 2019-09-18 DIAGNOSIS — Z8546 Personal history of malignant neoplasm of prostate: Secondary | ICD-10-CM | POA: Diagnosis not present

## 2019-09-18 DIAGNOSIS — Z7984 Long term (current) use of oral hypoglycemic drugs: Secondary | ICD-10-CM | POA: Diagnosis not present

## 2019-09-18 DIAGNOSIS — D649 Anemia, unspecified: Secondary | ICD-10-CM | POA: Diagnosis not present

## 2019-09-18 DIAGNOSIS — E1122 Type 2 diabetes mellitus with diabetic chronic kidney disease: Secondary | ICD-10-CM | POA: Diagnosis not present

## 2019-09-18 DIAGNOSIS — N320 Bladder-neck obstruction: Secondary | ICD-10-CM | POA: Diagnosis not present

## 2019-09-18 DIAGNOSIS — E785 Hyperlipidemia, unspecified: Secondary | ICD-10-CM | POA: Diagnosis not present

## 2019-09-19 ENCOUNTER — Ambulatory Visit: Payer: Self-pay

## 2019-09-19 ENCOUNTER — Encounter: Payer: Medicare Other | Admitting: Nurse Practitioner

## 2019-09-24 ENCOUNTER — Ambulatory Visit (INDEPENDENT_AMBULATORY_CARE_PROVIDER_SITE_OTHER): Payer: Medicare Other | Admitting: Nurse Practitioner

## 2019-09-24 ENCOUNTER — Other Ambulatory Visit: Payer: Self-pay

## 2019-09-24 ENCOUNTER — Telehealth: Payer: Self-pay

## 2019-09-24 ENCOUNTER — Encounter: Payer: Self-pay | Admitting: Nurse Practitioner

## 2019-09-24 VITALS — BP 108/70 | HR 58 | Temp 96.9°F | Ht 70.5 in | Wt 261.0 lb

## 2019-09-24 VITALS — BP 108/70 | HR 58 | Temp 96.9°F | Ht 70.5 in | Wt 261.4 lb

## 2019-09-24 DIAGNOSIS — I10 Essential (primary) hypertension: Secondary | ICD-10-CM | POA: Diagnosis not present

## 2019-09-24 DIAGNOSIS — C7951 Secondary malignant neoplasm of bone: Secondary | ICD-10-CM

## 2019-09-24 DIAGNOSIS — E1142 Type 2 diabetes mellitus with diabetic polyneuropathy: Secondary | ICD-10-CM

## 2019-09-24 DIAGNOSIS — I059 Rheumatic mitral valve disease, unspecified: Secondary | ICD-10-CM

## 2019-09-24 DIAGNOSIS — M159 Polyosteoarthritis, unspecified: Secondary | ICD-10-CM

## 2019-09-24 DIAGNOSIS — R600 Localized edema: Secondary | ICD-10-CM

## 2019-09-24 DIAGNOSIS — I6523 Occlusion and stenosis of bilateral carotid arteries: Secondary | ICD-10-CM

## 2019-09-24 DIAGNOSIS — Z0000 Encounter for general adult medical examination without abnormal findings: Secondary | ICD-10-CM | POA: Diagnosis not present

## 2019-09-24 DIAGNOSIS — I420 Dilated cardiomyopathy: Secondary | ICD-10-CM | POA: Diagnosis not present

## 2019-09-24 DIAGNOSIS — C7952 Secondary malignant neoplasm of bone marrow: Secondary | ICD-10-CM | POA: Diagnosis not present

## 2019-09-24 DIAGNOSIS — R413 Other amnesia: Secondary | ICD-10-CM

## 2019-09-24 DIAGNOSIS — E782 Mixed hyperlipidemia: Secondary | ICD-10-CM

## 2019-09-24 DIAGNOSIS — M1A9XX0 Chronic gout, unspecified, without tophus (tophi): Secondary | ICD-10-CM | POA: Diagnosis not present

## 2019-09-24 DIAGNOSIS — I3481 Nonrheumatic mitral (valve) annulus calcification: Secondary | ICD-10-CM

## 2019-09-24 DIAGNOSIS — Z6837 Body mass index (BMI) 37.0-37.9, adult: Secondary | ICD-10-CM

## 2019-09-24 DIAGNOSIS — M8949 Other hypertrophic osteoarthropathy, multiple sites: Secondary | ICD-10-CM | POA: Diagnosis not present

## 2019-09-24 NOTE — Patient Instructions (Signed)
Jeremiah Collins , Thank you for taking time to come for your Medicare Wellness Visit. I appreciate your ongoing commitment to your health goals. Please review the following plan we discussed and let me know if I can assist you in the future.   Screening recommendations/referrals: Colonoscopy aged out Recommended yearly ophthalmology/optometry visit for glaucoma screening and checkup Recommended yearly dental visit for hygiene and checkup  Vaccinations: Influenza vaccine up to date Pneumococcal vaccine- up to date Tdap vaccine -up to date Shingles vaccine To get at local pharmacy.     Advanced directives:recommended to complete this paper work and bring back to appt to have on file.  Conditions/risks identified: progressive memory loss  Next appointment: 1 year  Preventive Care 55 Years and Older, Male Preventive care refers to lifestyle choices and visits with your health care provider that can promote health and wellness. What does preventive care include?  A yearly physical exam. This is also called an annual well check.  Dental exams once or twice a year.  Routine eye exams. Ask your health care provider how often you should have your eyes checked.  Personal lifestyle choices, including:  Daily care of your teeth and gums.  Regular physical activity.  Eating a healthy diet.  Avoiding tobacco and drug use.  Limiting alcohol use.  Practicing safe sex.  Taking low doses of aspirin every day.  Taking vitamin and mineral supplements as recommended by your health care provider. What happens during an annual well check? The services and screenings done by your health care provider during your annual well check will depend on your age, overall health, lifestyle risk factors, and family history of disease. Counseling  Your health care provider may ask you questions about your:  Alcohol use.  Tobacco use.  Drug use.  Emotional well-being.  Home and relationship  well-being.  Sexual activity.  Eating habits.  History of falls.  Memory and ability to understand (cognition).  Work and work Statistician. Screening  You may have the following tests or measurements:  Height, weight, and BMI.  Blood pressure.  Lipid and cholesterol levels. These may be checked every 5 years, or more frequently if you are over 11 years old.  Skin check.  Lung cancer screening. You may have this screening every year starting at age 57 if you have a 30-pack-year history of smoking and currently smoke or have quit within the past 15 years.  Fecal occult blood test (FOBT) of the stool. You may have this test every year starting at age 63.  Flexible sigmoidoscopy or colonoscopy. You may have a sigmoidoscopy every 5 years or a colonoscopy every 10 years starting at age 62.  Prostate cancer screening. Recommendations will vary depending on your family history and other risks.  Hepatitis C blood test.  Hepatitis B blood test.  Sexually transmitted disease (STD) testing.  Diabetes screening. This is done by checking your blood sugar (glucose) after you have not eaten for a while (fasting). You may have this done every 1-3 years.  Abdominal aortic aneurysm (AAA) screening. You may need this if you are a current or former smoker.  Osteoporosis. You may be screened starting at age 60 if you are at high risk. Talk with your health care provider about your test results, treatment options, and if necessary, the need for more tests. Vaccines  Your health care provider may recommend certain vaccines, such as:  Influenza vaccine. This is recommended every year.  Tetanus, diphtheria, and acellular pertussis (Tdap, Td)  vaccine. You may need a Td booster every 10 years.  Zoster vaccine. You may need this after age 39.  Pneumococcal 13-valent conjugate (PCV13) vaccine. One dose is recommended after age 24.  Pneumococcal polysaccharide (PPSV23) vaccine. One dose is  recommended after age 72. Talk to your health care provider about which screenings and vaccines you need and how often you need them. This information is not intended to replace advice given to you by your health care provider. Make sure you discuss any questions you have with your health care provider. Document Released: 11/28/2015 Document Revised: 07/21/2016 Document Reviewed: 09/02/2015 Elsevier Interactive Patient Education  2017 [employer] Prevention in the Home Falls can cause injuries. They can happen to people of all ages. There are many things you can do to make your home safe and to help prevent falls. What can I do on the outside of my home?  Regularly fix the edges of walkways and driveways and fix any cracks.  Remove anything that might make you trip as you walk through a door, such as a raised step or threshold.  Trim any bushes or trees on the path to your home.  Use bright outdoor lighting.  Clear any walking paths of anything that might make someone trip, such as rocks or tools.  Regularly check to see if handrails are loose or broken. Make sure that both sides of any steps have handrails.  Any raised decks and porches should have guardrails on the edges.  Have any leaves, snow, or ice cleared regularly.  Use sand or salt on walking paths during winter.  Clean up any spills in your garage right away. This includes oil or grease spills. What can I do in the bathroom?  Use night lights.  Install grab bars by the toilet and in the tub and shower. Do not use towel bars as grab bars.  Use non-skid mats or decals in the tub or shower.  If you need to sit down in the shower, use a plastic, non-slip stool.  Keep the floor dry. Clean up any water that spills on the floor as soon as it happens.  Remove soap buildup in the tub or shower regularly.  Attach bath mats securely with double-sided non-slip rug tape.  Do not have throw rugs and other things on  the floor that can make you trip. What can I do in the bedroom?  Use night lights.  Make sure that you have a light by your bed that is easy to reach.  Do not use any sheets or blankets that are too big for your bed. They should not hang down onto the floor.  Have a firm chair that has side arms. You can use this for support while you get dressed.  Do not have throw rugs and other things on the floor that can make you trip. What can I do in the kitchen?  Clean up any spills right away.  Avoid walking on wet floors.  Keep items that you use a lot in easy-to-reach places.  If you need to reach something above you, use a strong step stool that has a grab bar.  Keep electrical cords out of the way.  Do not use floor polish or wax that makes floors slippery. If you must use wax, use non-skid floor wax.  Do not have throw rugs and other things on the floor that can make you trip. What can I do with my stairs?  Do not leave  any items on the stairs.  Make sure that there are handrails on both sides of the stairs and use them. Fix handrails that are broken or loose. Make sure that handrails are as long as the stairways.  Check any carpeting to make sure that it is firmly attached to the stairs. Fix any carpet that is loose or worn.  Avoid having throw rugs at the top or bottom of the stairs. If you do have throw rugs, attach them to the floor with carpet tape.  Make sure that you have a light switch at the top of the stairs and the bottom of the stairs. If you do not have them, ask someone to add them for you. What else can I do to help prevent falls?  Wear shoes that:  Do not have high heels.  Have rubber bottoms.  Are comfortable and fit you well.  Are closed at the toe. Do not wear sandals.  If you use a stepladder:  Make sure that it is fully opened. Do not climb a closed stepladder.  Make sure that both sides of the stepladder are locked into place.  Ask someone to  hold it for you, if possible.  Clearly mark and make sure that you can see:  Any grab bars or handrails.  First and last steps.  Where the edge of each step is.  Use tools that help you move around (mobility aids) if they are needed. These include:  Canes.  Walkers.  Scooters.  Crutches.  Turn on the lights when you go into a dark area. Replace any light bulbs as soon as they burn out.  Set up your furniture so you have a clear path. Avoid moving your furniture around.  If any of your floors are uneven, fix them.  If there are any pets around you, be aware of where they are.  Review your medicines with your doctor. Some medicines can make you feel dizzy. This can increase your chance of falling. Ask your doctor what other things that you can do to help prevent falls. This information is not intended to replace advice given to you by your health care provider. Make sure you discuss any questions you have with your health care provider. Document Released: 08/28/2009 Document Revised: 04/08/2016 Document Reviewed: 12/06/2014 Elsevier Interactive Patient Education  2017 [employer].

## 2019-09-24 NOTE — Progress Notes (Signed)
Subjective:   Jeremiah Collins is a 85 y.o. male who presents for Medicare Annual/Subsequent preventive examination.  Review of Systems:   Cardiac Risk Factors include: diabetes mellitus;advanced age (>64men, >51 women);dyslipidemia;hypertension;obesity (BMI >30kg/m2);male gender;sedentary lifestyle;family history of premature cardiovascular disease     Objective:    Vitals: BP 108/70    Pulse (!) 58    Temp (!) 96.9 F (36.1 C) (Temporal)    Ht 5' 10.5" (1.791 m)    Wt 261 lb (118.4 kg)    SpO2 98%    BMI 36.92 kg/m   Body mass index is 36.92 kg/m.  Advanced Directives 09/24/2019 09/24/2019 06/22/2019 04/03/2019 03/16/2019 11/24/2018 11/17/2018  Does Patient Have a Medical Advance Directive? No No No No No No No  Does patient want to make changes to medical advance directive? - - - No - Patient declined No - Patient declined - -  Would patient like information on creating a medical advance directive? No - Patient declined No - Patient declined - - - No - Patient declined No - Patient declined    Tobacco Social History   Tobacco Use  Smoking Status Former Smoker   Years: 5.00   Types: Cigars   Quit date: 12/16/1968   Years since quitting: 50.8  Smokeless Tobacco Never Used     Counseling given: Not Answered   Clinical Intake:  Pre-visit preparation completed: Yes  Pain : No/denies pain     BMI - recorded: 36.92 Nutritional Status: BMI > 30  Obese Nutritional Risks: None Diabetes: Yes CBG done?: No Did pt. bring in CBG monitor from home?: No  How often do you need to have someone help you when you read instructions, pamphlets, or other written materials from your doctor or pharmacy?: 1 - Never What is the last grade level you completed in school?: 12th grade  Interpreter Needed?: No     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/28/2006   Bone metastases ([place])    left mid tibial shaft   Cataract    Dr.Groat     Cellulitis and abscess of leg, except foot 01/17/2012   Chest pain, unspecified 04/28/2004   Closed fracture of five ribs 03/24/2004   First degree atrioventricular block 04/25/2007   Gross hematuria 09/06/2011   History of radiation therapy 04/03/14- 04/17/14   mid to distal left tibia 3000 cGy in 10 sessions   HTN (hypertension)    Hx of radiation therapy 11/1998   prostate fossa - 6040 cGy, 33 fx, Dr Danny Lawless   Osteoarthrosis involving, or with mention of more than one site, but not specified as generalized, multiple sites 10/22/2010   Other abnormal blood chemistry 04/29/1991   Other and unspecified hyperlipidemia 01/02/2013   Palpitations 04/28/2004   Prostate cancer ([place]) 12/16/1994   gleason 7   Reflux esophagitis 05/10/2008   Routine general medical examination at a health care facility    Spinal stenosis, unspecified region other than cervical 04/29/1995   Type II or unspecified type diabetes mellitus without mention of complication, uncontrolled    Past Surgical History:  Procedure Laterality Date   AIR/FLUID EXCHANGE Left 01/09/2016   Procedure: AIR/FLUID EXCHANGE;  Surgeon: Jalene Mullet, MD;  Location: [facility];  Service: Ophthalmology;  Laterality: Left;   CYSTOSCOPY W/ URETERAL STENT PLACEMENT  12/31/14   CYSTOSCOPY W/ URETERAL STENT PLACEMENT  05/16/2015   EYE SURGERY  2006   cataract, Dr Shanon Rosser   LASER PHOTO ABLATION  Left 01/09/2016   Procedure: LASER PHOTO ABLATION;  Surgeon: Jalene Mullet, MD;  Location: [facility];  Service: Ophthalmology;  Laterality: Left;  Endolaser   Left ureteral stent placement  Week of 12/05/11   PARS PLANA VITRECTOMY Left 01/09/2016   Procedure: PARS PLANA VITRECTOMY WITH 25 GAUGE LEFT EYE ;  Surgeon: Jalene Mullet, MD;  Location: [facility];  Service: Ophthalmology;  Laterality: Left;   PERFLUORONE INJECTION Left 01/09/2016   Procedure: PERFLUORONE INJECTION;  Surgeon: Jalene Mullet, MD;  Location: [facility];  Service: Ophthalmology;   Laterality: Left;   PROSTATECTOMY  1996   Dr. Rosana Hoes   Family History  Problem Relation Age of Onset   Heart disease Father    Stroke Sister    Social History   Socioeconomic History   Marital status: Married    Spouse name: Not on file   Number of children: Not on file   Years of education: Not on file   Highest education level: Not on file  Occupational History   Not on file  Social Needs   Financial resource strain: Not hard at all   Food insecurity    Worry: Never true    Inability: Never true   Transportation needs    Medical: No    Non-medical: No  Tobacco Use   Smoking status: Former Smoker    Years: 5.00    Types: Cigars    Quit date: 12/16/1968    Years since quitting: 50.8   Smokeless tobacco: Never Used  Substance and Sexual Activity   Alcohol use: No    Alcohol/week: 0.0 standard drinks   Drug use: No   Sexual activity: Never  Lifestyle   Physical activity    Days per week: 0 days    Minutes per session: 0 min   Stress: Not at all  Relationships   Social connections    Talks on phone: Three times a week    Gets together: Once a week    Attends religious service: Never    Active member of club or organization: No    Attends meetings of clubs or organizations: Never    Relationship status: Married  Other Topics Concern   Not on file  Social History Narrative   Not on file    Outpatient Encounter Medications as of 09/24/2019  Medication Sig   acetaminophen (TYLENOL) 650 MG CR tablet Take 650 mg by mouth as needed for pain.   allopurinol (ZYLOPRIM) 100 MG tablet Take 1 tablet (100 mg total) by mouth daily.   aspirin 81 MG tablet Take 1 tablet (81 mg total) by mouth daily.   calcium carbonate (TUMS - DOSED IN MG ELEMENTAL CALCIUM) 500 MG chewable tablet Chew 1 tablet by mouth as needed for indigestion or heartburn.   Cholecalciferol 1000 UNITS tablet Take 1,000 Units by mouth daily.   doxazosin (CARDURA) 4 MG tablet Take 1  tablet (4 mg total) by mouth daily.   enzalutamide (XTANDI) 40 MG capsule Take 120 mg by mouth daily.    glucose blood (ONETOUCH VERIO) test strip Check blood sugar one to two times a week. Dx:E11.29   leuprolide, 6 Month, (LEUPROLIDE ACETATE, 6 MONTH,) 45 MG injection Inject 45 mg into the skin every 6 (six) months. Filled by [facility]   lisinopril-hydrochlorothiazide (ZESTORETIC) 20-12.5 MG tablet Take 2 tablets by mouth every morning.   metFORMIN (GLUCOPHAGE) 500 MG tablet Take one tablet by mouth once daily with breakfast to control blood sugar   metoprolol tartrate (  LOPRESSOR) 50 MG tablet TAKE 1 TABLET BY MOUTH TWICE A DAY   Omega-3 Fatty Acids (FISH OIL) 1000 MG CAPS Take 1 capsule by mouth daily.   pyridoxine (B-6) 100 MG tablet Take 100 mg by mouth daily.   [DISCONTINUED] lisinopril-hydrochlorothiazide (ZESTORETIC) 20-12.5 MG tablet Take 2 tablets in the morning and 1 tablet at bed-time for hypertension. (Patient not taking: Reported on 09/24/2019)   [DISCONTINUED] Zoster Vaccine Adjuvanted [facility]) injection Inject 0.5 mLs into the muscle once.   rosuvastatin (CRESTOR) 20 MG tablet Take 1 tablet (20 mg total) by mouth daily.   simvastatin (ZOCOR) 20 MG tablet TAKE 1 TABLET BY MOUTH EVERY DAY   [DISCONTINUED] torsemide (DEMADEX) 10 MG tablet Take 1 tablet (10 mg total) by mouth daily for 3 days. Then take one by mouth daily as needed for leg swelling,shortness of breath or abrupt weight gain > 3 lbs.   No facility-administered encounter medications on file as of 09/24/2019.     Activities of Daily Living In your present state of health, do you have any difficulty performing the following activities: 09/24/2019  Hearing? Y  Vision? N  Difficulty concentrating or making decisions? Y  Walking or climbing stairs? Y  Comment increase weakness, back pain  Dressing or bathing? N  Doing errands, shopping? N  Preparing Food and eating ? N  Using the Toilet? N  In the  past six months, have you accidently leaked urine? Y  Comment hx of prostate cancer  Do you have problems with loss of bowel control? Y  Managing your Medications? N  Managing your Finances? N  Housekeeping or managing your Housekeeping? N  Some recent data might be hidden    Patient Care Team: Lauree Chandler, NP as PCP - General (Geriatric Medicine) Jerline Pain, MD as PCP - Cardiology (Cardiology) Myrlene Broker, MD as Attending Physician (Urology) [name], MD as Consulting Physician (Ophthalmology) [name], MD as Consulting Physician (Ophthalmology)   Assessment:   This is a routine wellness examination for Jeremiah Collins.  Exercise Activities and Dietary recommendations Current Exercise Habits: The patient does not participate in regular exercise at present, Exercise limited by: orthopedic condition(s)  Goals     Increase water intake     Starting 09/03/16, I will attempt to increase my water intake by 2 cups.      Maintian LIfestyle     Starting today pt will maintain lifestyle.        Fall Risk Fall Risk  09/24/2019 09/24/2019 07/10/2019 06/22/2019 04/03/2019  Falls in the past year? 0 0 0 0 0  Number falls in past yr: 0 0 0 - 0  Injury with Fall? 0 0 0 0 0   Is the patient's home free of loose throw rugs in walkways, pet beds, electrical cords, etc?   yes      Grab bars in the bathroom? yes      Handrails on the stairs?   yes      Adequate lighting?   yes  Timed Get Up and Go Performed: na  Depression Screen PHQ 2/9 Scores 09/24/2019 09/24/2019 06/22/2019 03/16/2019  PHQ - 2 Score 0 0 0 0    Cognitive Function MMSE - Mini Mental State Exam 09/24/2019 09/05/2017 09/03/2016 09/10/2014  Orientation to time 4 5 5 5   Orientation to Place 5 5 5 5   Registration 3 3 3 3   Attention/ Calculation 3 4 5 5   Recall 2 1 3 3   Language- name 2  objects 2 2 2 2   Language- repeat 1 1 1 1   Language- follow 3 step command 2 3 3 3   Language- read & follow direction 1 1  1 1   Write a sentence 1 1 0 1  Copy design 1 1 1 1   Total score 25 27 29 30         Immunization History  Administered Date(s) Administered   Influenza Whole 08/13/2010, 08/16/2011   Influenza, High Dose Seasonal PF 08/19/2016, 09/12/2018, 09/04/2019   Influenza, Quadrivalent, Recombinant, Inj, Pf 07/18/2013, 08/20/2015   Influenza,inj,Quad PF,6+ Mos 07/18/2013, 08/20/2015, 09/05/2017   Influenza-Unspecified 09/01/2016   Pneumococcal Conjugate-13 01/14/2015   Pneumococcal Polysaccharide-23 09/06/2011   Tdap 11/07/2013    Qualifies for Shingles Vaccine? Yes, encouraged to get at pharmacy  Screening Tests Health Maintenance  Topic Date Due   HEMOGLOBIN A1C  08/23/2019   OPHTHALMOLOGY EXAM  09/23/2019   FOOT EXAM  06/26/2020   TETANUS/TDAP  11/08/2023   INFLUENZA VACCINE  Completed   PNA vac Low Risk Adult  Completed   Cancer Screenings: Lung: Low Dose CT Chest recommended if Age 110-80 years, 30 pack-year currently smoking OR have quit w/in 15years. Patient does not qualify. Colorectal: aged out  Additional Screenings:  Hepatitis C Screening:na      Plan:     I have personally reviewed and noted the following in the patients chart:    Medical and social history  Use of alcohol, tobacco or illicit drugs   Current medications and supplements  Functional ability and status  Nutritional status  Physical activity  Advanced directives  List of other physicians  Hospitalizations, surgeries, and ER visits in previous 12 months  Vitals  Screenings to include cognitive, depression, and falls  Referrals and appointments  In addition, I have reviewed and discussed with patient certain preventive protocols, quality metrics, and best practice recommendations. A written personalized care plan for preventive services as well as general preventive health recommendations were provided to patient.     Lauree Chandler, NP  09/24/2019

## 2019-09-24 NOTE — Telephone Encounter (Signed)
Noted, based on the notes in epic and conversation with Mr Nygren I believe so as well.

## 2019-09-24 NOTE — Telephone Encounter (Signed)
Per Jessica's request call patients wife to clarify which cholesterol medication he is taken.  I called and spoke with Ree Edman initial response was patient takes care of his own medications.   Hassan Rowan looked at a med list dated 09/18/2019 (patient had surgery) for Mr.Benassi and stated simvastatin was not on the list yet she can not confirm.   Hassan Rowan looked at pills and was unable to locate a pill bottle for Simvastatin and stated again she is not 100 percent sure but thinks he is only taking crestor

## 2019-09-24 NOTE — Progress Notes (Signed)
Careteam: Patient Care Team: Lauree Chandler, NP as PCP - General (Geriatric Medicine) Jerline Pain, MD as PCP - Cardiology (Cardiology) Myrlene Broker, MD as Attending Physician (Urology) [name], MD as Consulting Physician (Ophthalmology) [name], MD as Consulting Physician (Ophthalmology)  Advanced Directive information    Allergies  Allergen Reactions  . Abiraterone Nausea And Vomiting and Other (See Comments)    Other reaction(s): Increased Heart Rate (intolerance)     Chief Complaint  Patient presents with  . Medical Management of Chronic Issues    3 month follow-up   . Medication Management    Discuss dose of lisinopril-HCTZ      HPI: Patient is a 85 y.o. male seen in the office today for routine follow up.  Obesity - ongoing, appears to have lost some weight but reports he does not follow diet modifications. no routine exercise but very active. Weight goes up and down based on review from the last few years.   mitral annular calcification -Followed by cardio Dr Leonia Reeves NP--last follow up in September. 2D echo updated,  revealed mild AS and EF (55-60%); grade 1 DD. He has chronic SOB without changes. No CP.     hx prostate CA and rec'd XRT for bone mets (left tibia and pelvis) - He is currently on injection q8mo. He also takes cardura. PSA stable on Xtandi. He [facility]  Followed by urology Dr DRosana Hoesand oncology Dr [place]ello [place] [place] He had ureteral stent change Sep 18, 2019 and stent changed q322mo "I stay at that hospital" No increase in pain noted   HTN - states he is taking lisinopril-hctz 2 tablets in am only, using metoprolol BID and on ASA- blood pressure well controlled.    Hyperlipidemia - continues on statin- confusion as to what he is actually taking. LDL 80 in Auguest   Multiple joint pain - mostly in back. has been stable and does  not need to take medication for this. He takes prn tylenol when he needs it.    Hx anemia - hgb 11.3    CKD - stage 3. Cr 1.17 in September which is stable    DM- A1c at goal 6 months ago at 5.6, on metformin and diet modifications. No low blood sugar. Generally blood sugar in the 120s.  Unsure if he has had his diabetic eye exam- states he thinks he is due for appt.   GOUT- continues on allopurinol, no recent flares.   Reports "skin cancer" to right ear, had procedure to remove and grafted skin from shoulder still following up with dermatologist regarding this.  Neuropathy/Unsteady gait- ongoing,  evaluated by neurology and found to have sensorineural loss in BLE perhaps related to diabetic neuropathy. Unchanged  Review of Systems:  Review of Systems  Constitutional: Positive for weight loss. Negative for chills and fever.  HENT: Negative for tinnitus.   Respiratory: Negative for cough, sputum production and shortness of breath.   Cardiovascular: Negative for chest pain, palpitations and leg swelling.  Gastrointestinal: Negative for abdominal pain, constipation, diarrhea and heartburn.  Genitourinary: Positive for frequency.  Musculoskeletal: Positive for back pain and joint pain. Negative for falls and myalgias.  Skin: Negative.   Neurological: Positive for tingling (to LE, stable). Negative for dizziness and headaches.  Psychiatric/Behavioral: Negative for depression and memory loss. The patient is nervous/anxious. The patient does not have insomnia.  Past Medical History:  Diagnosis Date  . Acute bronchitis 09/12/2012  . 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 Bone metastases ([place])    left mid tibial shaft  . Cataract    Dr.Groat  . Cellulitis and abscess of leg, except foot 01/17/2012  . Chest pain, unspecified 04/28/2004  . Closed fracture of five ribs 03/24/2004  . First degree atrioventricular block 04/25/2007  . Gross hematuria 09/06/2011  . History of radiation therapy  04/03/14- 04/17/14   mid to distal left tibia 3000 cGy in 10 sessions  . HTN (hypertension)   . Hx of radiation therapy 11/1998   prostate fossa - 6040 cGy, 33 fx, Dr Danny Lawless  . Osteoarthrosis involving, or with mention of more than one site, but not specified as generalized, multiple sites 10/22/2010  . Other abnormal blood chemistry 04/29/1991  . Other and unspecified hyperlipidemia 01/02/2013  . Palpitations 04/28/2004  . Prostate cancer ([place]) 12/16/1994   gleason 7  . Reflux esophagitis 05/10/2008  . Routine general medical examination at a health care facility   . Spinal stenosis, unspecified region other than cervical 04/29/1995  . Type II or unspecified type diabetes mellitus without mention of complication, uncontrolled    Past Surgical History:  Procedure Laterality Date  . AIR/FLUID EXCHANGE Left 01/09/2016   Procedure: AIR/FLUID EXCHANGE;  Surgeon: Jalene Mullet, MD;  Location: [facility];  Service: Ophthalmology;  Laterality: Left;  . CYSTOSCOPY W/ URETERAL STENT PLACEMENT  12/31/14  . CYSTOSCOPY W/ URETERAL STENT PLACEMENT  05/16/2015  . EYE SURGERY  2006   cataract, Dr Shanon Rosser  . LASER PHOTO ABLATION Left 01/09/2016   Procedure: LASER PHOTO ABLATION;  Surgeon: Jalene Mullet, MD;  Location: [facility];  Service: Ophthalmology;  Laterality: Left;  Endolaser  . Left ureteral stent placement  Week of 12/05/11  . PARS PLANA VITRECTOMY Left 01/09/2016   Procedure: PARS PLANA VITRECTOMY WITH 25 GAUGE LEFT EYE ;  Surgeon: Jalene Mullet, MD;  Location: [facility];  Service: Ophthalmology;  Laterality: Left;  . PERFLUORONE INJECTION Left 01/09/2016   Procedure: PERFLUORONE INJECTION;  Surgeon: Jalene Mullet, MD;  Location: [facility];  Service: Ophthalmology;  Laterality: Left;  . PROSTATECTOMY  1996   Dr. Rosana Hoes   Social History:   reports that he quit smoking about 50 years ago. His smoking use included cigars. He quit after 5.00 years of use. He has never used smokeless tobacco. He reports that he does  not drink alcohol or use drugs.  Family History  Problem Relation Age of Onset  . Heart disease Father   . Stroke Sister     Medications: Patient's Medications  New Prescriptions   No medications on file  Previous Medications   ACETAMINOPHEN (TYLENOL) 650 MG CR TABLET    Take 650 mg by mouth as needed for pain.   ALLOPURINOL (ZYLOPRIM) 100 MG TABLET    Take 1 tablet (100 mg total) by mouth daily.   ASPIRIN 81 MG TABLET    Take 1 tablet (81 mg total) by mouth daily.   CALCIUM CARBONATE (TUMS - DOSED IN MG ELEMENTAL CALCIUM) 500 MG CHEWABLE TABLET    Chew 1 tablet by mouth as needed for indigestion or heartburn.   CHOLECALCIFEROL 1000 UNITS TABLET    Take 1,000 Units by mouth daily.   DOXAZOSIN (CARDURA) 4 MG TABLET    Take 1 tablet (4 mg total) by mouth daily.   ENZALUTAMIDE (XTANDI) 40 MG CAPSULE    Take 120 mg by mouth daily.    GLUCOSE  BLOOD (ONETOUCH VERIO) TEST STRIP    Check blood sugar one to two times a week. Dx:E11.29   LEUPROLIDE, 6 MONTH, (LEUPROLIDE ACETATE, 6 MONTH,) 45 MG INJECTION    Inject 45 mg into the skin every 6 (six) months. Filled by [facility] (ZESTORETIC) 20-12.5 MG TABLET    Take 2 tablets by mouth every morning.   METFORMIN (GLUCOPHAGE) 500 MG TABLET    Take one tablet by mouth once daily with breakfast to control blood sugar   METOPROLOL TARTRATE (LOPRESSOR) 50 MG TABLET    TAKE 1 TABLET BY MOUTH TWICE A DAY   OMEGA-3 FATTY ACIDS (FISH OIL) 1000 MG CAPS    Take 1 capsule by mouth daily.   PYRIDOXINE (B-6) 100 MG TABLET    Take 100 mg by mouth daily.   ROSUVASTATIN (CRESTOR) 20 MG TABLET    Take 1 tablet (20 mg total) by mouth daily.   SIMVASTATIN (ZOCOR) 20 MG TABLET    TAKE 1 TABLET BY MOUTH EVERY DAY  Modified Medications   No medications on file  Discontinued Medications   LISINOPRIL-HYDROCHLOROTHIAZIDE (ZESTORETIC) 20-12.5 MG TABLET    Take 2 tablets in the morning and 1 tablet at bed-time for hypertension.    TORSEMIDE (DEMADEX) 10 MG TABLET    Take 1 tablet (10 mg total) by mouth daily for 3 days. Then take one by mouth daily as needed for leg swelling,shortness of breath or abrupt weight gain > 3 lbs.   ZOSTER VACCINE ADJUVANTED [facility]) INJECTION    Inject 0.5 mLs into the muscle once.    Physical Exam:  Vitals:   09/24/19 0825  BP: 108/70  Pulse: (!) 58  Temp: (!) 96.9 F (36.1 C)  TempSrc: Temporal  SpO2: 98%  Weight: 261 lb 6.4 oz (118.6 kg)  Height: 5' 10.5" (1.791 m)   Body mass index is 36.98 kg/m. Wt Readings from Last 3 Encounters:  09/24/19 261 lb 6.4 oz (118.6 kg)  07/17/19 278 lb (126.1 kg)  06/22/19 269 lb (122 kg)    Physical Exam Constitutional:      General: He is not in acute distress.    Appearance: He is well-developed. He is not diaphoretic.  HENT:     Head: Normocephalic and atraumatic.  Eyes:     Conjunctiva/sclera: Conjunctivae normal.     Pupils: Pupils are equal, round, and reactive to light.  Neck:     Musculoskeletal: Normal range of motion and neck supple.  Cardiovascular:     Rate and Rhythm: Normal rate and regular rhythm.     Heart sounds: Murmur present.  Pulmonary:     Effort: Pulmonary effort is normal.     Breath sounds: Normal breath sounds.  Abdominal:     General: Bowel sounds are normal.     Palpations: Abdomen is soft.  Musculoskeletal:        General: No tenderness.     Right lower leg: Edema (1+) present.     Left lower leg: Edema (1+) present.  Skin:    General: Skin is warm and dry.  Neurological:     Mental Status: He is alert and oriented to person, place, and time.    Labs reviewed: Basic Metabolic Panel: Recent Labs    02/21/19 0824 06/18/19 0809 08/07/19 0827  NA 139 141 140  K 4.3 4.5 4.4  CL 107 108 107  CO2 _0 GLUCOSE 128* 125* 129*  BUN 25 29* 22  CREATININE 1.13*  1.18* 1.17*  CALCIUM 8.6 8.8 9.0   Liver Function Tests: Recent Labs    11/08/18 1007 11/21/18 0825 02/21/19 0824  06/18/19 0809  AST _0 ALT 12 8* 8* 8*  ALKPHOS 70  --   --   --   BILITOT 0.5 0.5 0.5 0.4  PROT 6.1* 5.9* 5.9* 5.8*  ALBUMIN 3.1*  --   --   --    No results for input(s): LIPASE, AMYLASE in the last 8760 hours. No results for input(s): AMMONIA in the last 8760 hours. CBC: Recent Labs    11/21/18 0825 05/09/19 1602 06/18/19 0809  WBC 9.3 12.8* 8.1  NEUTROABS 5,813 8,973* 5,119  HGB 12.7* 12.1* 11.3*  HCT 37.2* 34.5* 33.2*  MCV 84.2 83.3 85.3  PLT 210 195 193   Lipid Panel: Recent Labs    11/21/18 0825 02/21/19 0824 06/18/19 0809  CHOL 186 160 134  HDL 43 45 38*  LDLCALC 119* 97 80  TRIG 125 88 80  CHOLHDL 4.3 3.6 3.5   TSH: No results for input(s): TSH in the last 8760 hours. A1C: Lab Results  Component Value Date   HGBA1C 5.6 02/21/2019     Assessment/Plan 1. Chronic gout without tophus, unspecified cause, unspecified site -no further flares, continues on allopurinol 100 mg daily - Uric Acid  2. DM type 2 with diabetic peripheral neuropathy (HCC) -A1c at goal, continues on metformin 500 mg daily without hypoglycemia.  - Hemoglobin A1c  3. Congestive dilated cardiomyopathy ([place]) -stable, followed by cardiologist, without signs of fluid overload at this time   4. Secondary malignant neoplasm of bone and bone marrow (HCC) -stable without increase in pain, ongoing follow up by oncologist.   5. Mixd hyperlipidemia -spoke with wife and suspect that he is not taking zocor, based on cardiologist last note it looks like he was changed to crestor.  - CMP with eGFR([facility]) - Lipid Panel  6. Essential hypertension, benign -controlled on metoprolol BID with lisinopril-hctz 2 tablet in the morning. Encouraged dietary modifications.  - CBC with Differential/Platelet  7. Memory loss -MMSE 25/30 - B12 a/nd Folate Panel - RPR - TSH  8. Class 2 severe obesity due to excess calories with serious comorbidity and body mass index (BMI) of 37.0 to 37.9 in  adult [facility]) -has had some weight loss, continued to encourage diet modifications. Continues to be active but without routine exercise.   9. Bilateral edema of lower extremity -stable at this time  10. Primary osteoarthritis involving multiple joints -stable, occasionally will use tylenol if needed  11. Mitral annular calcification Stable. Continues with yearly cardiology follow up.   Next appt: 4 month for routine follow up.  Carlos American. [place], [facility] [phone]

## 2019-09-24 NOTE — Patient Instructions (Signed)
Look over medication and compare to your list, call us if there is any changes

## 2019-09-25 ENCOUNTER — Other Ambulatory Visit: Payer: Self-pay | Admitting: Nurse Practitioner

## 2019-09-25 DIAGNOSIS — M109 Gout, unspecified: Secondary | ICD-10-CM

## 2019-09-25 LAB — CBC WITH DIFFERENTIAL/PLATELET
Absolute Monocytes: 378 cells/uL (ref 200–950)
Basophils Absolute: 31 cells/uL (ref 0–200)
Basophils Relative: 0.5 %
Eosinophils Absolute: 81 cells/uL (ref 15–500)
Eosinophils Relative: 1.3 %
HCT: 33.7 % — ABNORMAL LOW (ref 38.5–50.0)
Hemoglobin: 11.4 g/dL — ABNORMAL LOW (ref 13.2–17.1)
Lymphs Abs: 1848 cells/uL (ref 850–3900)
MCH: 29.2 pg (ref 27.0–33.0)
MCHC: 33.8 g/dL (ref 32.0–36.0)
MCV: 86.2 fL (ref 80.0–100.0)
MPV: 10 fL (ref 7.5–12.5)
Monocytes Relative: 6.1 %
Neutro Abs: 3863 cells/uL (ref 1500–7800)
Neutrophils Relative %: 62.3 %
Platelets: 184 10*3/uL (ref 140–400)
RBC: 3.91 10*6/uL — ABNORMAL LOW (ref 4.20–5.80)
RDW: 14.5 % (ref 11.0–15.0)
Total Lymphocyte: 29.8 %
WBC: 6.2 10*3/uL (ref 3.8–10.8)

## 2019-09-25 LAB — HEMOGLOBIN A1C
Hgb A1c MFr Bld: 5.7 % of total Hgb — ABNORMAL HIGH (ref <5.7)
Mean Plasma Glucose: 117 (calc)
eAG (mmol/L): 6.5 (calc)

## 2019-09-25 LAB — COMPLETE METABOLIC PANEL WITH GFR
AG Ratio: 1.4 (calc) (ref 1.0–2.5)
ALT: 11 U/L (ref 9–46)
AST: 12 U/L (ref 10–35)
Albumin: 3.3 g/dL — ABNORMAL LOW (ref 3.6–5.1)
Alkaline phosphatase (APISO): 80 U/L (ref 35–144)
BUN/Creatinine Ratio: 27 (calc) — ABNORMAL HIGH (ref 6–22)
BUN: 33 mg/dL — ABNORMAL HIGH (ref 7–25)
CO2: 23 mmol/L (ref 20–32)
Calcium: 8.7 mg/dL (ref 8.6–10.3)
Chloride: 107 mmol/L (ref 98–110)
Creat: 1.21 mg/dL — ABNORMAL HIGH (ref 0.70–1.11)
GFR, Est African American: 64 mL/min/{1.73_m2} (ref >=60)
GFR, Est Non African American: 55 mL/min/{1.73_m2} — ABNORMAL LOW (ref >=60)
Globulin: 2.3 g/dL (calc) (ref 1.9–3.7)
Glucose, Bld: 125 mg/dL — ABNORMAL HIGH (ref 65–99)
Potassium: 3.6 mmol/L (ref 3.5–5.3)
Sodium: 139 mmol/L (ref 135–146)
Total Bilirubin: 0.4 mg/dL (ref 0.2–1.2)
Total Protein: 5.6 g/dL — ABNORMAL LOW (ref 6.1–8.1)

## 2019-09-25 LAB — LIPID PANEL
Cholesterol: 112 mg/dL (ref <200)
HDL: 31 mg/dL — ABNORMAL LOW (ref >=40)
LDL Cholesterol (Calc): 61 mg/dL (calc)
Non-HDL Cholesterol (Calc): 81 mg/dL (calc) (ref <130)
Total CHOL/HDL Ratio: 3.6 (calc) (ref <5.0)
Triglycerides: 118 mg/dL (ref <150)

## 2019-09-25 LAB — URIC ACID: Uric Acid, Serum: 8.2 mg/dL — ABNORMAL HIGH (ref 4.0–8.0)

## 2019-09-25 LAB — B12 AND FOLATE PANEL
Folate: 7.2 ng/mL
Vitamin B-12: 415 pg/mL (ref 200–1100)

## 2019-09-25 LAB — TSH: TSH: 0.75 mIU/L (ref 0.40–4.50)

## 2019-09-25 LAB — RPR: RPR Ser Ql: NONREACTIVE

## 2019-09-25 NOTE — Telephone Encounter (Signed)
Patient's medication was recently changed and patient is requesting refill. Medication has high allergy/contraindication warning alert. Routing to provider for approval on refill.

## 2019-10-02 DIAGNOSIS — C44222 Squamous cell carcinoma of skin of right ear and external auricular canal: Secondary | ICD-10-CM | POA: Diagnosis not present

## 2019-11-15 LAB — HM DIABETES EYE EXAM

## 2019-11-19 ENCOUNTER — Encounter: Payer: Self-pay | Admitting: *Deleted

## 2019-11-20 ENCOUNTER — Ambulatory Visit: Payer: Self-pay

## 2019-11-27 ENCOUNTER — Other Ambulatory Visit: Payer: Self-pay | Admitting: Nurse Practitioner

## 2019-12-05 ENCOUNTER — Ambulatory Visit: Payer: Medicare Other | Attending: Internal Medicine

## 2019-12-05 DIAGNOSIS — Z23 Encounter for immunization: Secondary | ICD-10-CM | POA: Diagnosis not present

## 2019-12-05 NOTE — Progress Notes (Signed)
   Covid-19 Vaccination Clinic  Name:  Jeremiah Collins    MRN: [id number] DOB: Aug 17, 1934  12/05/2019  Mr. [name] was observed post Covid-19 immunization for 15 minutes without incidence. He was provided with Vaccine Information Sheet and instruction to access the V-Safe system.   Mr. [name] was instructed to call 911 with any severe reactions post vaccine: Marland Kitchen Difficulty breathing  . Swelling of your face and throat  . A fast heartbeat  . A bad rash all over your body  . Dizziness and weakness    Immunizations Administered    Name Date Dose VIS Date Route   Pfizer COVID-19 Vaccine 12/05/2019  9:26 AM 0.3 mL 10/26/2019 Intramuscular   Manufacturer: [employer]   Lot: [id number]   [place]: [id number]

## 2019-12-06 ENCOUNTER — Other Ambulatory Visit: Payer: Self-pay | Admitting: Nurse Practitioner

## 2019-12-06 DIAGNOSIS — Z79899 Other long term (current) drug therapy: Secondary | ICD-10-CM | POA: Diagnosis not present

## 2019-12-06 DIAGNOSIS — Z79818 Long term (current) use of other agents affecting estrogen receptors and estrogen levels: Secondary | ICD-10-CM | POA: Diagnosis not present

## 2019-12-06 DIAGNOSIS — I10 Essential (primary) hypertension: Secondary | ICD-10-CM | POA: Diagnosis not present

## 2019-12-06 DIAGNOSIS — E1140 Type 2 diabetes mellitus with diabetic neuropathy, unspecified: Secondary | ICD-10-CM | POA: Diagnosis not present

## 2019-12-06 DIAGNOSIS — Z192 Hormone resistant malignancy status: Secondary | ICD-10-CM | POA: Diagnosis not present

## 2019-12-06 DIAGNOSIS — C7951 Secondary malignant neoplasm of bone: Secondary | ICD-10-CM | POA: Diagnosis not present

## 2019-12-06 DIAGNOSIS — G629 Polyneuropathy, unspecified: Secondary | ICD-10-CM | POA: Diagnosis not present

## 2019-12-06 DIAGNOSIS — R2681 Unsteadiness on feet: Secondary | ICD-10-CM | POA: Diagnosis not present

## 2019-12-06 DIAGNOSIS — C61 Malignant neoplasm of prostate: Secondary | ICD-10-CM | POA: Diagnosis not present

## 2019-12-11 DIAGNOSIS — Z20822 Contact with and (suspected) exposure to covid-19: Secondary | ICD-10-CM | POA: Diagnosis not present

## 2019-12-11 DIAGNOSIS — N135 Crossing vessel and stricture of ureter without hydronephrosis: Secondary | ICD-10-CM | POA: Diagnosis not present

## 2019-12-11 DIAGNOSIS — Z01812 Encounter for preprocedural laboratory examination: Secondary | ICD-10-CM | POA: Diagnosis not present

## 2019-12-13 DIAGNOSIS — L905 Scar conditions and fibrosis of skin: Secondary | ICD-10-CM | POA: Diagnosis not present

## 2019-12-13 DIAGNOSIS — B351 Tinea unguium: Secondary | ICD-10-CM | POA: Diagnosis not present

## 2019-12-13 DIAGNOSIS — D485 Neoplasm of uncertain behavior of skin: Secondary | ICD-10-CM | POA: Diagnosis not present

## 2019-12-13 DIAGNOSIS — L819 Disorder of pigmentation, unspecified: Secondary | ICD-10-CM | POA: Diagnosis not present

## 2019-12-13 DIAGNOSIS — C44519 Basal cell carcinoma of skin of other part of trunk: Secondary | ICD-10-CM | POA: Diagnosis not present

## 2019-12-13 DIAGNOSIS — L821 Other seborrheic keratosis: Secondary | ICD-10-CM | POA: Diagnosis not present

## 2019-12-13 DIAGNOSIS — L814 Other melanin hyperpigmentation: Secondary | ICD-10-CM | POA: Diagnosis not present

## 2019-12-13 DIAGNOSIS — D229 Melanocytic nevi, unspecified: Secondary | ICD-10-CM | POA: Diagnosis not present

## 2019-12-13 DIAGNOSIS — L708 Other acne: Secondary | ICD-10-CM | POA: Diagnosis not present

## 2019-12-13 DIAGNOSIS — D1801 Hemangioma of skin and subcutaneous tissue: Secondary | ICD-10-CM | POA: Diagnosis not present

## 2019-12-13 DIAGNOSIS — L570 Actinic keratosis: Secondary | ICD-10-CM | POA: Diagnosis not present

## 2019-12-13 DIAGNOSIS — Z85828 Personal history of other malignant neoplasm of skin: Secondary | ICD-10-CM | POA: Diagnosis not present

## 2019-12-18 DIAGNOSIS — N320 Bladder-neck obstruction: Secondary | ICD-10-CM | POA: Diagnosis not present

## 2019-12-18 DIAGNOSIS — N200 Calculus of kidney: Secondary | ICD-10-CM | POA: Diagnosis not present

## 2019-12-18 DIAGNOSIS — D649 Anemia, unspecified: Secondary | ICD-10-CM | POA: Diagnosis not present

## 2019-12-18 DIAGNOSIS — E1122 Type 2 diabetes mellitus with diabetic chronic kidney disease: Secondary | ICD-10-CM | POA: Diagnosis not present

## 2019-12-18 DIAGNOSIS — K219 Gastro-esophageal reflux disease without esophagitis: Secondary | ICD-10-CM | POA: Diagnosis not present

## 2019-12-18 DIAGNOSIS — E785 Hyperlipidemia, unspecified: Secondary | ICD-10-CM | POA: Diagnosis not present

## 2019-12-18 DIAGNOSIS — N183 Chronic kidney disease, stage 3 unspecified: Secondary | ICD-10-CM | POA: Diagnosis not present

## 2019-12-18 DIAGNOSIS — I129 Hypertensive chronic kidney disease with stage 1 through stage 4 chronic kidney disease, or unspecified chronic kidney disease: Secondary | ICD-10-CM | POA: Diagnosis not present

## 2019-12-18 DIAGNOSIS — Z466 Encounter for fitting and adjustment of urinary device: Secondary | ICD-10-CM | POA: Diagnosis not present

## 2019-12-18 DIAGNOSIS — Z87891 Personal history of nicotine dependence: Secondary | ICD-10-CM | POA: Diagnosis not present

## 2019-12-18 DIAGNOSIS — N135 Crossing vessel and stricture of ureter without hydronephrosis: Secondary | ICD-10-CM | POA: Diagnosis not present

## 2019-12-18 DIAGNOSIS — Z8546 Personal history of malignant neoplasm of prostate: Secondary | ICD-10-CM | POA: Diagnosis not present

## 2019-12-23 ENCOUNTER — Ambulatory Visit: Payer: Medicare Other | Attending: Internal Medicine

## 2019-12-23 DIAGNOSIS — Z23 Encounter for immunization: Secondary | ICD-10-CM | POA: Insufficient documentation

## 2019-12-23 NOTE — Progress Notes (Signed)
   Covid-19 Vaccination Clinic  Name:  JEMIR SOERGEL    MRN: [id number] DOB: [birth year]  12/23/2019  Mr. Layden was observed post Covid-19 immunization for 15 minutes without incidence. He was provided with Vaccine Information Sheet and instruction to access the V-Safe system.   Mr. Sill was instructed to call 911 with any severe reactions post vaccine: Marland Kitchen Difficulty breathing  . Swelling of your face and throat  . A fast heartbeat  . A bad rash all over your body  . Dizziness and weakness    Immunizations Administered    Name Date Dose VIS Date Route   Pfizer COVID-19 Vaccine 12/23/2019  8:06 AM 0.3 mL 10/26/2019 Intramuscular   Manufacturer: [place]   Lot: [id number]   [place] [place]: [id number]

## 2020-01-10 ENCOUNTER — Other Ambulatory Visit: Payer: Self-pay | Admitting: Nurse Practitioner

## 2020-01-10 DIAGNOSIS — I517 Cardiomegaly: Secondary | ICD-10-CM

## 2020-01-10 DIAGNOSIS — I10 Essential (primary) hypertension: Secondary | ICD-10-CM

## 2020-01-28 ENCOUNTER — Other Ambulatory Visit: Payer: Self-pay

## 2020-01-28 ENCOUNTER — Encounter: Payer: Self-pay | Admitting: Nurse Practitioner

## 2020-01-28 ENCOUNTER — Ambulatory Visit (INDEPENDENT_AMBULATORY_CARE_PROVIDER_SITE_OTHER): Payer: Medicare Other | Admitting: Nurse Practitioner

## 2020-01-28 VITALS — BP 138/80 | HR 55 | Temp 96.8°F | Ht 71.0 in | Wt 274.2 lb

## 2020-01-28 DIAGNOSIS — I420 Dilated cardiomyopathy: Secondary | ICD-10-CM

## 2020-01-28 DIAGNOSIS — M159 Polyosteoarthritis, unspecified: Secondary | ICD-10-CM

## 2020-01-28 DIAGNOSIS — M8949 Other hypertrophic osteoarthropathy, multiple sites: Secondary | ICD-10-CM | POA: Diagnosis not present

## 2020-01-28 DIAGNOSIS — C7952 Secondary malignant neoplasm of bone marrow: Secondary | ICD-10-CM

## 2020-01-28 DIAGNOSIS — J439 Emphysema, unspecified: Secondary | ICD-10-CM

## 2020-01-28 DIAGNOSIS — M1A9XX0 Chronic gout, unspecified, without tophus (tophi): Secondary | ICD-10-CM

## 2020-01-28 DIAGNOSIS — N183 Chronic kidney disease, stage 3 unspecified: Secondary | ICD-10-CM | POA: Diagnosis not present

## 2020-01-28 DIAGNOSIS — D631 Anemia in chronic kidney disease: Secondary | ICD-10-CM

## 2020-01-28 DIAGNOSIS — Z6837 Body mass index (BMI) 37.0-37.9, adult: Secondary | ICD-10-CM

## 2020-01-28 DIAGNOSIS — E1142 Type 2 diabetes mellitus with diabetic polyneuropathy: Secondary | ICD-10-CM

## 2020-01-28 DIAGNOSIS — E1122 Type 2 diabetes mellitus with diabetic chronic kidney disease: Secondary | ICD-10-CM

## 2020-01-28 DIAGNOSIS — N1832 Chronic kidney disease, stage 3b: Secondary | ICD-10-CM | POA: Diagnosis not present

## 2020-01-28 DIAGNOSIS — I700 Atherosclerosis of aorta: Secondary | ICD-10-CM

## 2020-01-28 DIAGNOSIS — C7951 Secondary malignant neoplasm of bone: Secondary | ICD-10-CM

## 2020-01-28 NOTE — Progress Notes (Signed)
Careteam: Patient Care Team: Lauree Chandler, NP as PCP - General (Geriatric Medicine) Jerline Pain, MD as PCP - Cardiology (Cardiology) Myrlene Broker, MD as Attending Physician (Urology) [name], MD as Consulting Physician (Ophthalmology) [name], MD as Consulting Physician (Ophthalmology)  PLACE OF SERVICE:  [place] Directive information Does Patient Have a Medical Advance Directive?: No, Would patient like information on creating a medical advance directive?: No - Patient declined  Allergies  Allergen Reactions  . Abiraterone Nausea And Vomiting and Other (See Comments)    Other reaction(s): Increased Heart Rate (intolerance)     Chief Complaint  Patient presents with  . Medical Management of Chronic Issues    4 month follow-up     HPI: Patient is a 86 y.o. male for routine follow up.   mitral annular calcifications- followed by cardiology, no chest pains. Reports chronic shortness of breath with activity but this has been unchanged.   hx of prostate cancer and rec'd xrt for bone mets (left tibia and pelvis) injection every 6 months. Remains on cardura (continues with urinary leakage), PSA stabel on xtandi, followed by oncologist and urologist.  No pain noted.   htn- lisinopril hctz, metoprolol BID and ASA   Hyperlipidemia- LDL at goal on crestor  CKD, stage 3, with anemia- stable on recent labs.   Gout- no recent flares, continues on allopurinol 200 mg (2 of the 100 mg tablet) daily  Hyperglycemia- A1c 5.7 in November, fast blood sugars 100-120s. Stopped metformin 6 months ago.    Review of Systems:  Review of Systems  Constitutional: Negative for chills, fever and weight loss.  HENT: Negative for tinnitus.   Respiratory: Negative for cough, sputum production and shortness of breath.   Cardiovascular: Positive for leg swelling (unchanged). Negative for chest pain and palpitations.  Gastrointestinal: Negative for  abdominal pain, constipation, diarrhea and heartburn.  Genitourinary: Positive for frequency and urgency. Negative for dysuria.  Musculoskeletal: Negative for back pain, falls, joint pain and myalgias.  Skin: Negative.   Neurological: Positive for tingling. Negative for dizziness and headaches.  Psychiatric/Behavioral: Negative for depression and memory loss. The patient is nervous/anxious. The patient does not have insomnia.     Past Medical History:  Diagnosis Date  . Acute bronchitis 09/12/2012  . 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 Bone metastases ([place])    left mid tibial shaft  . Cataract    Dr.Groat  . Cellulitis and abscess of leg, except foot 01/17/2012  . Chest pain, unspecified 04/28/2004  . Closed fracture of five ribs 03/24/2004  . First degree atrioventricular block 04/25/2007  . Gross hematuria 09/06/2011  . History of radiation therapy 04/03/14- 04/17/14   mid to distal left tibia 3000 cGy in 10 sessions  . HTN (hypertension)   . Hx of radiation therapy 11/1998   prostate fossa - 6040 cGy, 33 fx, Dr Danny Lawless  . Osteoarthrosis involving, or with mention of more than one site, but not specified as generalized, multiple sites 10/22/2010  . Other abnormal blood chemistry 04/29/1991  . Other and unspecified hyperlipidemia 01/02/2013  . Palpitations 04/28/2004  . Prostate cancer ([place]) 12/16/1994   gleason 7  . Reflux esophagitis 05/10/2008  . Routine general medical examination at a health care facility   . Spinal stenosis, unspecified region other than cervical 04/29/1995  . Type II or unspecified type diabetes mellitus without mention of complication, uncontrolled    Past Surgical History:  Procedure Laterality Date  . AIR/FLUID EXCHANGE  Left 01/09/2016   Procedure: AIR/FLUID EXCHANGE;  Surgeon: Jalene Mullet, MD;  Location: [facility];  Service: Ophthalmology;  Laterality: Left;  . CYSTOSCOPY W/ URETERAL STENT PLACEMENT  12/31/14  . CYSTOSCOPY W/ URETERAL STENT PLACEMENT   05/16/2015  . EYE SURGERY  2006   cataract, Dr Shanon Rosser  . LASER PHOTO ABLATION Left 01/09/2016   Procedure: LASER PHOTO ABLATION;  Surgeon: Jalene Mullet, MD;  Location: [facility];  Service: Ophthalmology;  Laterality: Left;  Endolaser  . Left ureteral stent placement  Week of 12/05/11  . PARS PLANA VITRECTOMY Left 01/09/2016   Procedure: PARS PLANA VITRECTOMY WITH 25 GAUGE LEFT EYE ;  Surgeon: Jalene Mullet, MD;  Location: [facility];  Service: Ophthalmology;  Laterality: Left;  . PERFLUORONE INJECTION Left 01/09/2016   Procedure: PERFLUORONE INJECTION;  Surgeon: Jalene Mullet, MD;  Location: [facility];  Service: Ophthalmology;  Laterality: Left;  . PROSTATECTOMY  1996   Dr. Rosana Hoes   Social History:   reports that he quit smoking about 51 years ago. His smoking use included cigars. He quit after 5.00 years of use. He has never used smokeless tobacco. He reports that he does not drink alcohol or use drugs.  Family History  Problem Relation Age of Onset  . Heart disease Father   . Stroke Sister     Medications: Patient's Medications  New Prescriptions   No medications on file  Previous Medications   ACETAMINOPHEN (TYLENOL) 650 MG CR TABLET    Take 650 mg by mouth as needed for pain.   ALLOPURINOL (ZYLOPRIM) 100 MG TABLET    Take 2 tablets (200 mg total) by mouth daily.   ASPIRIN 81 MG TABLET    Take 1 tablet (81 mg total) by mouth daily.   CALCIUM CARBONATE (TUMS - DOSED IN MG ELEMENTAL CALCIUM) 500 MG CHEWABLE TABLET    Chew 1 tablet by mouth as needed for indigestion or heartburn.   CHOLECALCIFEROL 1000 UNITS TABLET    Take 1,000 Units by mouth daily.   DOXAZOSIN (CARDURA) 4 MG TABLET    TAKE 1 TABLET BY MOUTH EVERY DAY   ENZALUTAMIDE (XTANDI) 40 MG CAPSULE    Take 120 mg by mouth daily.    GLUCOSE BLOOD (ONETOUCH VERIO) TEST STRIP    Check blood sugar one to two times a week. Dx:E11.29   LEUPROLIDE, 6 MONTH, (LEUPROLIDE ACETATE, 6 MONTH,) 45 MG INJECTION    Inject 45 mg into the skin every 6 (six)  months. Filled by [facility] (ZESTORETIC) 20-12.5 MG TABLET    Take 2 tablets by mouth every morning.   METOPROLOL TARTRATE (LOPRESSOR) 50 MG TABLET    TAKE 1 TABLET BY MOUTH TWICE A DAY   OMEGA-3 FATTY ACIDS (FISH OIL) 1000 MG CAPS    Take 1 capsule by mouth daily.   PYRIDOXINE (B-6) 100 MG TABLET    Take 100 mg by mouth daily.   ROSUVASTATIN (CRESTOR) 20 MG TABLET    Take 1 tablet (20 mg total) by mouth daily.  Modified Medications   No medications on file  Discontinued Medications   No medications on file    Physical Exam:  Vitals:   01/28/20 0827  BP: 138/80  Pulse: (!) 55  Temp: (!) 96.8 F (36 C)  TempSrc: Temporal  SpO2: 98%  Weight: 274 lb 3.2 oz (124.4 kg)  Height: 5\' 11"  (1.803 m)   Body mass index is 38.24 kg/m. Wt Readings from Last 3 Encounters:  01/28/20 274 lb  3.2 oz (124.4 kg)  09/24/19 261 lb (118.4 kg)  09/24/19 261 lb 6.4 oz (118.6 kg)    Physical Exam Constitutional:      General: He is not in acute distress.    Appearance: He is well-developed. He is not diaphoretic.  HENT:     Head: Normocephalic and atraumatic.     Mouth/Throat:     Pharynx: No oropharyngeal exudate.  Eyes:     Conjunctiva/sclera: Conjunctivae normal.     Pupils: Pupils are equal, round, and reactive to light.  Cardiovascular:     Rate and Rhythm: Normal rate and regular rhythm.     Heart sounds: Murmur (2/6) present.  Pulmonary:     Effort: Pulmonary effort is normal.     Breath sounds: Normal breath sounds.  Abdominal:     General: Bowel sounds are normal.     Palpations: Abdomen is soft.  Musculoskeletal:        General: No tenderness.     Cervical back: Normal range of motion and neck supple.     Right lower leg: Edema (trace) present.     Left lower leg: Edema (trace) present.  Skin:    General: Skin is warm and dry.  Neurological:     Mental Status: He is alert and oriented to person, place, and time.     Labs  reviewed: Basic Metabolic Panel: Recent Labs    06/18/19 0809 08/07/19 0827 09/24/19 0946 09/24/19 0949  NA 141 140  --  139  K 4.5 4.4  --  3.6  CL 108 107  --  107  CO2 24 26  --  23  GLUCOSE 125* 129*  --  125*  BUN 29* 22  --  33*  CREATININE 1.18* 1.17*  --  1.21*  CALCIUM 8.8 9.0  --  8.7  TSH  --   --  0.75  --    Liver Function Tests: Recent Labs    02/21/19 0824 06/18/19 0809 09/24/19 0949  AST 11 11 12   ALT 8* 8* 11  BILITOT 0.5 0.4 0.4  PROT 5.9* 5.8* 5.6*   No results for input(s): LIPASE, AMYLASE in the last 8760 hours. No results for input(s): AMMONIA in the last 8760 hours. CBC: Recent Labs    05/09/19 1602 06/18/19 0809 09/24/19 0949  WBC 12.8* 8.1 6.2  NEUTROABS 8,973* 5,119 3,863  HGB 12.1* 11.3* 11.4*  HCT 34.5* 33.2* 33.7*  MCV 83.3 85.3 86.2  PLT 195 193 184   Lipid Panel: Recent Labs    02/21/19 0824 06/18/19 0809 09/24/19 0949  CHOL 160 134 112  HDL 45 38* 31*  LDLCALC 97 80 61  TRIG 88 80 118  CHOLHDL 3.6 3.5 3.6   TSH: Recent Labs    09/24/19 0946  TSH 0.75   A1C: Lab Results  Component Value Date   HGBA1C 5.7 (H) 09/24/2019     Assessment/Plan 1. DM type 2 with diabetic peripheral neuropathy (HCC) -weight gain noted, continues off metformin. Will follow up lab at this time. -Encouraged dietary compliance, routine foot care/monitoring and to keep up with diabetic eye exams through ophthalmology  - Hemoglobin A1c  2. Chronic gout without tophus, unspecified cause, unspecified site -stable without another flare since on allopurinol, continues on allopurinol 200 mg daily  - Uric Acid  3. Secondary malignant neoplasm of bone and bone marrow (HCC) -ongoing follow up with oncology and urology. Continues on leuprolide and xtandi   4. Pulmonary emphysema, unspecified emphysema  type ([place]) Stable at this time. Without worsening shortness of breath  5. Congestive dilated cardiomyopathy (HCC) -stable, without signs of  fluid overload, no worsening shortness of breath, edema. Continues on lopressor BID with lisinopril- hctz  6. Atherosclerosis of aorta ([place]) Noted on imaging, continues on ASA 81 mg with crestor.   7. Class 2 severe obesity due to excess calories with serious comorbidity and body mass index (BMI) of 37.0 to 37.9 in adult [facility]) -has gained weight, discussed this today. Encouraged dietary modifications with increase in physical activity as tolerates.   8. Primary osteoarthritis involving multiple joints Stable continues on tylenol as needed  9. CKD stage 3 secondary to diabetes [facility]) Encourage proper hydration and to avoid NSAIDS (Aleve, Advil, Motrin, Ibuprofen)  - COMPLETE METABOLIC PANEL WITH GFR  10. Anemia due to stage 3b chronic kidney disease -will follow up lab.  - CBC with Differential/Platelet  Next appt: 6 months  Cola Gane K. [place], [facility] [phone]

## 2020-01-28 NOTE — Patient Instructions (Addendum)
Look over MOST form. I encourage you and your wife to make an appt so we can fill these out If you have a smart phone we can do this via virtual visit otherwise come into the office to complete.  Would recommend this appt in 6 weeks  6 month follow up for routine follow up    [place] stands for "Dietary Approaches to Stop Hypertension." The DASH eating plan is a healthy eating plan that has been shown to reduce high blood pressure (hypertension). It may also reduce your risk for type 2 diabetes, heart disease, and stroke. The DASH eating plan may also help with weight loss. What are tips for following this plan?  General guidelines  Avoid eating more than 2,300 mg (milligrams) of salt (sodium) a day. If you have hypertension, you may need to reduce your sodium intake to 1,500 mg a day.  Limit alcohol intake to no more than 1 drink a day for nonpregnant women and 2 drinks a day for men. One drink equals 12 oz of beer, 5 oz of wine, or 1 oz of hard liquor.  Work with your health care provider to maintain a healthy body weight or to lose weight. Ask what an ideal weight is for you.  Get at least 30 minutes of exercise that causes your heart to beat faster (aerobic exercise) most days of the week. Activities may include walking, swimming, or biking.  Work with your health care provider or diet and nutrition specialist (dietitian) to adjust your eating plan to your individual calorie needs. Reading food labels   Check food labels for the amount of sodium per serving. Choose foods with less than 5 percent of the Daily Value of sodium. Generally, foods with less than 300 mg of sodium per serving fit into this eating plan.  To find whole grains, look for the word "whole" as the first word in the ingredient list. Shopping  Buy products labeled as "low-sodium" or "no salt added."  Buy fresh foods. Avoid canned foods and premade or frozen meals. Cooking  Avoid adding salt when  cooking. Use salt-free seasonings or herbs instead of table salt or sea salt. Check with your health care provider or pharmacist before using salt substitutes.  Do not fry foods. Cook foods using healthy methods such as baking, boiling, grilling, and broiling instead.  Cook with heart-healthy oils, such as olive, canola, soybean, or sunflower oil. Meal planning  Eat a balanced diet that includes: ? 5 or more servings of fruits and vegetables each day. At each meal, try to fill half of your plate with fruits and vegetables. ? Up to 6-8 servings of whole grains each day. ? Less than 6 oz of lean meat, poultry, or fish each day. A 3-oz serving of meat is about the same size as a deck of cards. One egg equals 1 oz. ? 2 servings of low-fat dairy each day. ? A serving of nuts, seeds, or beans 5 times each week. ? Heart-healthy fats. Healthy fats called Omega-3 fatty acids are found in foods such as flaxseeds and coldwater fish, like sardines, salmon, and mackerel.  Limit how much you eat of the following: ? Canned or prepackaged foods. ? Food that is high in trans fat, such as fried foods. ? Food that is high in saturated fat, such as fatty meat. ? Sweets, desserts, sugary drinks, and other foods with added sugar. ? Full-fat dairy products.  Do not salt foods before eating.  Try to eat at least 2 vegetarian meals each week.  Eat more home-cooked food and less restaurant, buffet, and fast food.  When eating at a restaurant, ask that your food be prepared with less salt or no salt, if possible. What foods are recommended? The items listed may not be a complete list. Talk with your dietitian about what dietary choices are best for you. Grains Whole-grain or whole-wheat bread. Whole-grain or whole-wheat pasta. Brown rice. [place]. Bulgur. Whole-grain and low-sodium cereals. Pita bread. Low-fat, low-sodium crackers. Whole-wheat flour tortillas. Vegetables Fresh or frozen vegetables  (raw, steamed, roasted, or grilled). Low-sodium or reduced-sodium tomato and vegetable juice. Low-sodium or reduced-sodium tomato sauce and tomato paste. Low-sodium or reduced-sodium canned vegetables. Fruits All fresh, dried, or frozen fruit. Canned fruit in natural juice (without added sugar). Meat and other protein foods Skinless chicken or [place]. Ground chicken or [place]. Pork with fat trimmed off. Fish and seafood. Egg whites. Dried beans, peas, or lentils. Unsalted nuts, nut butters, and seeds. Unsalted canned beans. Lean cuts of beef with fat trimmed off. Low-sodium, lean deli meat. Dairy Low-fat (1%) or fat-free (skim) milk. Fat-free, low-fat, or reduced-fat cheeses. Nonfat, low-sodium ricotta or cottage cheese. Low-fat or nonfat yogurt. Low-fat, low-sodium cheese. Fats and oils Soft margarine without trans fats. Vegetable oil. Low-fat, reduced-fat, or light mayonnaise and salad dressings (reduced-sodium). Canola, safflower, olive, soybean, and sunflower oils. Avocado. Seasoning and other foods Herbs. Spices. Seasoning mixes without salt. Unsalted popcorn and pretzels. Fat-free sweets. What foods are not recommended? The items listed may not be a complete list. Talk with your dietitian about what dietary choices are best for you. Grains Baked goods made with fat, such as croissants, muffins, or some breads. Dry pasta or rice meal packs. Vegetables Creamed or fried vegetables. Vegetables in a cheese sauce. Regular canned vegetables (not low-sodium or reduced-sodium). Regular canned tomato sauce and paste (not low-sodium or reduced-sodium). Regular tomato and vegetable juice (not low-sodium or reduced-sodium). Angie Fava. Olives. Fruits Canned fruit in a light or heavy syrup. Fried fruit. Fruit in cream or butter sauce. Meat and other protein foods Fatty cuts of meat. Ribs. Fried meat. Berniece Salines. Sausage. Bologna and other processed lunch meats. Salami. Fatback. Hotdogs. Bratwurst. Salted nuts  and seeds. Canned beans with added salt. Canned or smoked fish. Whole eggs or egg yolks. Chicken or [place] with skin. Dairy Whole or 2% milk, cream, and half-and-half. Whole or full-fat cream cheese. Whole-fat or sweetened yogurt. Full-fat cheese. Nondairy creamers. Whipped toppings. Processed cheese and cheese spreads. Fats and oils Butter. Stick margarine. Lard. Shortening. Ghee. Bacon fat. Tropical oils, such as coconut, palm kernel, or palm oil. Seasoning and other foods Salted popcorn and pretzels. Onion salt, garlic salt, seasoned salt, table salt, and sea salt. Worcestershire sauce. Tartar sauce. Barbecue sauce. Teriyaki sauce. Soy sauce, including reduced-sodium. Steak sauce. Canned and packaged gravies. Fish sauce. Oyster sauce. Cocktail sauce. Horseradish that you find on the shelf. Ketchup. Mustard. Meat flavorings and tenderizers. Bouillon cubes. Hot sauce and Tabasco sauce. Premade or packaged marinades. Premade or packaged taco seasonings. Relishes. Regular salad dressings. Where to find more information:  National Heart, Lung, and [place]: https://wilson-eaton.com/  American Heart Association: www.heart.org Summary  The DASH eating plan is a healthy eating plan that has been shown to reduce high blood pressure (hypertension). It may also reduce your risk for type 2 diabetes, heart disease, and stroke.  With the DASH eating plan, you should limit salt (sodium) intake to 2,300 mg a day. If  you have hypertension, you may need to reduce your sodium intake to 1,500 mg a day.  When on the DASH eating plan, aim to eat more fresh fruits and vegetables, whole grains, lean proteins, low-fat dairy, and heart-healthy fats.  Work with your health care provider or diet and nutrition specialist (dietitian) to adjust your eating plan to your individual calorie needs. This information is not intended to replace advice given to you by your health care provider. Make sure you discuss any questions  you have with your health care provider. Document Revised: 10/14/2017 Document Reviewed: 10/25/2016 Elsevier Patient Education  2020 [employer].

## 2020-01-29 ENCOUNTER — Encounter: Payer: Self-pay | Admitting: *Deleted

## 2020-01-29 LAB — CBC WITH DIFFERENTIAL/PLATELET
Absolute Monocytes: 403 cells/uL (ref 200–950)
Basophils Absolute: 30 cells/uL (ref 0–200)
Basophils Relative: 0.4 %
Eosinophils Absolute: 84 cells/uL (ref 15–500)
Eosinophils Relative: 1.1 %
HCT: 34.4 % — ABNORMAL LOW (ref 38.5–50.0)
Hemoglobin: 11.4 g/dL — ABNORMAL LOW (ref 13.2–17.1)
Lymphs Abs: 1923 cells/uL (ref 850–3900)
MCH: 28 pg (ref 27.0–33.0)
MCHC: 33.1 g/dL (ref 32.0–36.0)
MCV: 84.5 fL (ref 80.0–100.0)
MPV: 9.8 fL (ref 7.5–12.5)
Monocytes Relative: 5.3 %
Neutro Abs: 5160 cells/uL (ref 1500–7800)
Neutrophils Relative %: 67.9 %
Platelets: 176 10*3/uL (ref 140–400)
RBC: 4.07 10*6/uL — ABNORMAL LOW (ref 4.20–5.80)
RDW: 14.1 % (ref 11.0–15.0)
Total Lymphocyte: 25.3 %
WBC: 7.6 10*3/uL (ref 3.8–10.8)

## 2020-01-29 LAB — COMPLETE METABOLIC PANEL WITH GFR
AG Ratio: 1.7 (calc) (ref 1.0–2.5)
ALT: 9 U/L (ref 9–46)
AST: 12 U/L (ref 10–35)
Albumin: 3.6 g/dL (ref 3.6–5.1)
Alkaline phosphatase (APISO): 92 U/L (ref 35–144)
BUN: 22 mg/dL (ref 7–25)
CO2: 24 mmol/L (ref 20–32)
Calcium: 8.7 mg/dL (ref 8.6–10.3)
Chloride: 108 mmol/L (ref 98–110)
Creat: 1.09 mg/dL (ref 0.70–1.11)
GFR, Est African American: 72 mL/min/{1.73_m2} (ref >=60)
GFR, Est Non African American: 62 mL/min/{1.73_m2} (ref >=60)
Globulin: 2.1 g/dL (calc) (ref 1.9–3.7)
Glucose, Bld: 132 mg/dL — ABNORMAL HIGH (ref 65–99)
Potassium: 4.3 mmol/L (ref 3.5–5.3)
Sodium: 140 mmol/L (ref 135–146)
Total Bilirubin: 0.3 mg/dL (ref 0.2–1.2)
Total Protein: 5.7 g/dL — ABNORMAL LOW (ref 6.1–8.1)

## 2020-01-29 LAB — URIC ACID: Uric Acid, Serum: 5.7 mg/dL (ref 4.0–8.0)

## 2020-01-29 LAB — HEMOGLOBIN A1C
Hgb A1c MFr Bld: 5.8 % of total Hgb — ABNORMAL HIGH (ref <5.7)
Mean Plasma Glucose: 120 (calc)
eAG (mmol/L): 6.6 (calc)

## 2020-01-30 ENCOUNTER — Telehealth: Payer: Self-pay

## 2020-01-30 NOTE — Telephone Encounter (Signed)
Called patient per Lauree Chandler, NP request to schedule an Advance Care Planning appointment to discuss MOST form.  Patient refused to schedule an appointment. Patient states he was here on Monday and Janett Billow provided him with a copy of MOST form to review and look over and he has not had the chance to look at it yet. I informed patient that Janett Billow can go over it with him via video visit. Patient still refused to schedule   Janett Billow is aware of this interaction

## 2020-03-04 DIAGNOSIS — Z192 Hormone resistant malignancy status: Secondary | ICD-10-CM | POA: Diagnosis not present

## 2020-03-04 DIAGNOSIS — C61 Malignant neoplasm of prostate: Secondary | ICD-10-CM | POA: Diagnosis not present

## 2020-03-04 DIAGNOSIS — I10 Essential (primary) hypertension: Secondary | ICD-10-CM | POA: Diagnosis not present

## 2020-03-04 DIAGNOSIS — R2681 Unsteadiness on feet: Secondary | ICD-10-CM | POA: Diagnosis not present

## 2020-03-04 DIAGNOSIS — Z79899 Other long term (current) drug therapy: Secondary | ICD-10-CM | POA: Diagnosis not present

## 2020-03-04 DIAGNOSIS — G629 Polyneuropathy, unspecified: Secondary | ICD-10-CM | POA: Diagnosis not present

## 2020-03-04 DIAGNOSIS — C7951 Secondary malignant neoplasm of bone: Secondary | ICD-10-CM | POA: Diagnosis not present

## 2020-03-25 DIAGNOSIS — I129 Hypertensive chronic kidney disease with stage 1 through stage 4 chronic kidney disease, or unspecified chronic kidney disease: Secondary | ICD-10-CM | POA: Diagnosis not present

## 2020-03-25 DIAGNOSIS — E1122 Type 2 diabetes mellitus with diabetic chronic kidney disease: Secondary | ICD-10-CM | POA: Diagnosis not present

## 2020-03-25 DIAGNOSIS — M109 Gout, unspecified: Secondary | ICD-10-CM | POA: Diagnosis not present

## 2020-03-25 DIAGNOSIS — Z7982 Long term (current) use of aspirin: Secondary | ICD-10-CM | POA: Diagnosis not present

## 2020-03-25 DIAGNOSIS — Z8546 Personal history of malignant neoplasm of prostate: Secondary | ICD-10-CM | POA: Diagnosis not present

## 2020-03-25 DIAGNOSIS — N183 Chronic kidney disease, stage 3 unspecified: Secondary | ICD-10-CM | POA: Diagnosis not present

## 2020-03-25 DIAGNOSIS — N35919 Unspecified urethral stricture, male, unspecified site: Secondary | ICD-10-CM | POA: Diagnosis not present

## 2020-03-25 DIAGNOSIS — Z87891 Personal history of nicotine dependence: Secondary | ICD-10-CM | POA: Diagnosis not present

## 2020-03-25 DIAGNOSIS — I10 Essential (primary) hypertension: Secondary | ICD-10-CM | POA: Diagnosis not present

## 2020-03-25 DIAGNOSIS — Z466 Encounter for fitting and adjustment of urinary device: Secondary | ICD-10-CM | POA: Diagnosis not present

## 2020-03-25 DIAGNOSIS — N135 Crossing vessel and stricture of ureter without hydronephrosis: Secondary | ICD-10-CM | POA: Diagnosis not present

## 2020-04-09 ENCOUNTER — Other Ambulatory Visit: Payer: Self-pay | Admitting: Nurse Practitioner

## 2020-04-09 DIAGNOSIS — I10 Essential (primary) hypertension: Secondary | ICD-10-CM

## 2020-04-09 DIAGNOSIS — I517 Cardiomegaly: Secondary | ICD-10-CM

## 2020-04-13 ENCOUNTER — Other Ambulatory Visit: Payer: Self-pay | Admitting: Nurse Practitioner

## 2020-04-13 DIAGNOSIS — M109 Gout, unspecified: Secondary | ICD-10-CM

## 2020-05-15 HISTORY — PX: URETERAL STENT PLACEMENT: SHX822

## 2020-05-21 ENCOUNTER — Other Ambulatory Visit: Payer: Self-pay | Admitting: Nurse Practitioner

## 2020-06-01 ENCOUNTER — Other Ambulatory Visit: Payer: Self-pay | Admitting: Nurse Practitioner

## 2020-06-01 DIAGNOSIS — I10 Essential (primary) hypertension: Secondary | ICD-10-CM

## 2020-06-03 DIAGNOSIS — C7951 Secondary malignant neoplasm of bone: Secondary | ICD-10-CM | POA: Diagnosis not present

## 2020-06-03 DIAGNOSIS — Z79899 Other long term (current) drug therapy: Secondary | ICD-10-CM | POA: Diagnosis not present

## 2020-06-03 DIAGNOSIS — G629 Polyneuropathy, unspecified: Secondary | ICD-10-CM | POA: Diagnosis not present

## 2020-06-03 DIAGNOSIS — I10 Essential (primary) hypertension: Secondary | ICD-10-CM | POA: Diagnosis not present

## 2020-06-03 DIAGNOSIS — Z87891 Personal history of nicotine dependence: Secondary | ICD-10-CM | POA: Diagnosis not present

## 2020-06-03 DIAGNOSIS — C61 Malignant neoplasm of prostate: Secondary | ICD-10-CM | POA: Diagnosis not present

## 2020-06-03 DIAGNOSIS — Z192 Hormone resistant malignancy status: Secondary | ICD-10-CM | POA: Diagnosis not present

## 2020-06-03 DIAGNOSIS — M2550 Pain in unspecified joint: Secondary | ICD-10-CM | POA: Diagnosis not present

## 2020-06-03 DIAGNOSIS — R2681 Unsteadiness on feet: Secondary | ICD-10-CM | POA: Diagnosis not present

## 2020-06-03 DIAGNOSIS — Z923 Personal history of irradiation: Secondary | ICD-10-CM | POA: Diagnosis not present

## 2020-06-03 DIAGNOSIS — Z5111 Encounter for antineoplastic chemotherapy: Secondary | ICD-10-CM | POA: Diagnosis not present

## 2020-06-03 DIAGNOSIS — Z9079 Acquired absence of other genital organ(s): Secondary | ICD-10-CM | POA: Diagnosis not present

## 2020-06-10 DIAGNOSIS — H35033 Hypertensive retinopathy, bilateral: Secondary | ICD-10-CM | POA: Diagnosis not present

## 2020-06-10 DIAGNOSIS — Z8546 Personal history of malignant neoplasm of prostate: Secondary | ICD-10-CM | POA: Diagnosis not present

## 2020-06-10 DIAGNOSIS — H35372 Puckering of macula, left eye: Secondary | ICD-10-CM | POA: Diagnosis not present

## 2020-06-10 DIAGNOSIS — H26491 Other secondary cataract, right eye: Secondary | ICD-10-CM | POA: Diagnosis not present

## 2020-06-10 DIAGNOSIS — H35371 Puckering of macula, right eye: Secondary | ICD-10-CM | POA: Diagnosis not present

## 2020-06-10 DIAGNOSIS — H31092 Other chorioretinal scars, left eye: Secondary | ICD-10-CM | POA: Diagnosis not present

## 2020-06-11 DIAGNOSIS — L814 Other melanin hyperpigmentation: Secondary | ICD-10-CM | POA: Diagnosis not present

## 2020-06-11 DIAGNOSIS — L821 Other seborrheic keratosis: Secondary | ICD-10-CM | POA: Diagnosis not present

## 2020-06-11 DIAGNOSIS — L819 Disorder of pigmentation, unspecified: Secondary | ICD-10-CM | POA: Diagnosis not present

## 2020-06-11 DIAGNOSIS — L905 Scar conditions and fibrosis of skin: Secondary | ICD-10-CM | POA: Diagnosis not present

## 2020-06-11 DIAGNOSIS — Z85828 Personal history of other malignant neoplasm of skin: Secondary | ICD-10-CM | POA: Diagnosis not present

## 2020-06-11 DIAGNOSIS — D485 Neoplasm of uncertain behavior of skin: Secondary | ICD-10-CM | POA: Diagnosis not present

## 2020-06-11 DIAGNOSIS — D229 Melanocytic nevi, unspecified: Secondary | ICD-10-CM | POA: Diagnosis not present

## 2020-06-11 DIAGNOSIS — C44311 Basal cell carcinoma of skin of nose: Secondary | ICD-10-CM | POA: Diagnosis not present

## 2020-06-24 DIAGNOSIS — E785 Hyperlipidemia, unspecified: Secondary | ICD-10-CM | POA: Diagnosis not present

## 2020-06-24 DIAGNOSIS — D638 Anemia in other chronic diseases classified elsewhere: Secondary | ICD-10-CM | POA: Diagnosis not present

## 2020-06-24 DIAGNOSIS — I129 Hypertensive chronic kidney disease with stage 1 through stage 4 chronic kidney disease, or unspecified chronic kidney disease: Secondary | ICD-10-CM | POA: Diagnosis not present

## 2020-06-24 DIAGNOSIS — Z7984 Long term (current) use of oral hypoglycemic drugs: Secondary | ICD-10-CM | POA: Diagnosis not present

## 2020-06-24 DIAGNOSIS — Z466 Encounter for fitting and adjustment of urinary device: Secondary | ICD-10-CM | POA: Diagnosis not present

## 2020-06-24 DIAGNOSIS — N183 Chronic kidney disease, stage 3 unspecified: Secondary | ICD-10-CM | POA: Diagnosis not present

## 2020-06-24 DIAGNOSIS — Z87891 Personal history of nicotine dependence: Secondary | ICD-10-CM | POA: Diagnosis not present

## 2020-06-24 DIAGNOSIS — C61 Malignant neoplasm of prostate: Secondary | ICD-10-CM | POA: Diagnosis not present

## 2020-06-24 DIAGNOSIS — Z79899 Other long term (current) drug therapy: Secondary | ICD-10-CM | POA: Diagnosis not present

## 2020-06-24 DIAGNOSIS — N320 Bladder-neck obstruction: Secondary | ICD-10-CM | POA: Diagnosis not present

## 2020-06-24 DIAGNOSIS — Z960 Presence of urogenital implants: Secondary | ICD-10-CM | POA: Diagnosis not present

## 2020-06-24 DIAGNOSIS — N135 Crossing vessel and stricture of ureter without hydronephrosis: Secondary | ICD-10-CM | POA: Diagnosis not present

## 2020-06-24 DIAGNOSIS — E1122 Type 2 diabetes mellitus with diabetic chronic kidney disease: Secondary | ICD-10-CM | POA: Diagnosis not present

## 2020-07-07 ENCOUNTER — Other Ambulatory Visit: Payer: Self-pay | Admitting: Cardiology

## 2020-07-09 ENCOUNTER — Telehealth: Payer: Self-pay

## 2020-07-09 NOTE — Telephone Encounter (Signed)
**Note De-Identified Jeremiah Collins Obfuscation** I started a Rosuvastatin PA through covermymeds: Key: [id number]

## 2020-07-09 NOTE — Telephone Encounter (Signed)
Jeremiah Hough, LPN, [facility] is stating that pt's medication rosuvastatin is no longer covered by pt's insurance and that pt needs to be prescribed something else or a prior auth needs to be done. Can you please look into this matter for me? Thanks

## 2020-07-10 NOTE — Telephone Encounter (Signed)
**Note De-Identified Dalesha Stanback Obfuscation** Letter received from [employer] stating that a PA is not required for the pts Rosuvastatin and that they have cancelled the PA I started yesterday because Rosuvastatin is on the pts plans list of covered medications.  I have notified Abby at [facility] and she re-ran the pts Rosuvastatin RX and it went through with ins coverage. She states that they will fill and notify pt he can pick up today.

## 2020-07-16 ENCOUNTER — Other Ambulatory Visit: Payer: Self-pay

## 2020-07-16 ENCOUNTER — Encounter: Payer: Self-pay | Admitting: Cardiology

## 2020-07-16 ENCOUNTER — Ambulatory Visit (INDEPENDENT_AMBULATORY_CARE_PROVIDER_SITE_OTHER): Payer: Medicare Other | Admitting: Cardiology

## 2020-07-16 VITALS — BP 132/68 | HR 56 | Ht 71.0 in | Wt 268.8 lb

## 2020-07-16 DIAGNOSIS — I6523 Occlusion and stenosis of bilateral carotid arteries: Secondary | ICD-10-CM

## 2020-07-16 DIAGNOSIS — I059 Rheumatic mitral valve disease, unspecified: Secondary | ICD-10-CM

## 2020-07-16 DIAGNOSIS — I700 Atherosclerosis of aorta: Secondary | ICD-10-CM | POA: Diagnosis not present

## 2020-07-16 DIAGNOSIS — E782 Mixed hyperlipidemia: Secondary | ICD-10-CM | POA: Diagnosis not present

## 2020-07-16 DIAGNOSIS — I10 Essential (primary) hypertension: Secondary | ICD-10-CM | POA: Diagnosis not present

## 2020-07-16 DIAGNOSIS — I3481 Nonrheumatic mitral (valve) annulus calcification: Secondary | ICD-10-CM

## 2020-07-16 NOTE — Progress Notes (Signed)
Cardiology Office Note:    Date:  07/16/2020   ID:  [name], DOB [birth year], MRN [id number]  PCP:  Lauree Chandler, NP  [facility] Cardiologist:  Candee Furbish, MD  [facility] Electrophysiologist:  None   Referring MD: Lauree Chandler, NP     History of Present Illness:    Jeremiah Collins is a 86 y.o. male here for the follow-up of mitral annular calcification.  March 2017-calcified mass mitral valve dense mitral annular calcification.  No prior stroke.  Retinal malformation observed by ophthalmologist at 1 point.  Statin was increased.  Metastatic prostate cancer.  Back pain, disequilibrium.  Is also had some peripheral edema treated with torsemide in the past.  Has bilateral carotid artery disease.  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/28/2006   Bone metastases (HCC)    left mid tibial shaft   Cataract    Dr.Groat   Cellulitis and abscess of leg, except foot 01/17/2012   Chest pain, unspecified 04/28/2004   Closed fracture of five ribs 03/24/2004   First degree atrioventricular block 04/25/2007   Gross hematuria 09/06/2011   History of radiation therapy 04/03/14- 04/17/14   mid to distal left tibia 3000 cGy in 10 sessions   HTN (hypertension)    Hx of radiation therapy 11/1998   prostate fossa - 6040 cGy, 33 fx, Dr Danny Lawless   Osteoarthrosis involving, or with mention of more than one site, but not specified as generalized, multiple sites 10/22/2010   Other abnormal blood chemistry 04/29/1991   Other and unspecified hyperlipidemia 01/02/2013   Palpitations 04/28/2004   Prostate cancer ([place]) 12/16/1994   gleason 7   Reflux esophagitis 05/10/2008   Routine general medical examination at a health care facility    Spinal stenosis, unspecified region other than cervical 04/29/1995   Type II or unspecified type diabetes mellitus without mention of complication, uncontrolled      Past Surgical History:  Procedure Laterality Date   AIR/FLUID EXCHANGE Left 01/09/2016   Procedure: AIR/FLUID EXCHANGE;  Surgeon: Jalene Mullet, MD;  Location: [place];  Service: Ophthalmology;  Laterality: Left;   CYSTOSCOPY W/ URETERAL STENT PLACEMENT  12/31/14   CYSTOSCOPY W/ URETERAL STENT PLACEMENT  05/16/2015   EYE SURGERY  2006   cataract, Dr Shanon Rosser   LASER PHOTO ABLATION Left 01/09/2016   Procedure: LASER PHOTO ABLATION;  Surgeon: Jalene Mullet, MD;  Location: [place];  Service: Ophthalmology;  Laterality: Left;  Endolaser   Left ureteral stent placement  Week of 12/05/11   PARS PLANA VITRECTOMY Left 01/09/2016   Procedure: PARS PLANA VITRECTOMY WITH 25 GAUGE LEFT EYE ;  Surgeon: Jalene Mullet, MD;  Location: [place];  Service: Ophthalmology;  Laterality: Left;   PERFLUORONE INJECTION Left 01/09/2016   Procedure: PERFLUORONE INJECTION;  Surgeon: Jalene Mullet, MD;  Location: [place];  Service: Ophthalmology;  Laterality: Left;   PROSTATECTOMY  1996   Dr. Rosana Hoes    Current Medications: Current Meds  Medication Sig   acetaminophen (TYLENOL) 650 MG CR tablet Take 650 mg by mouth as needed for pain.   allopurinol (ZYLOPRIM) 100 MG tablet TAKE 2 TABLETS BY MOUTH EVERY DAY   aspirin 81 MG tablet Take 1 tablet (81 mg total) by mouth daily.   calcium carbonate (TUMS - DOSED IN MG ELEMENTAL CALCIUM) 500 MG chewable tablet Chew 1 tablet by mouth as needed for indigestion or heartburn.   Cholecalciferol 1000 UNITS tablet Take 1,000 Units  by mouth daily.   doxazosin (CARDURA) 4 MG tablet TAKE 1 TABLET BY MOUTH EVERY DAY   enzalutamide (XTANDI) 40 MG capsule Take 120 mg by mouth daily.    glucose blood (ONETOUCH VERIO) test strip Check blood sugar one to two times a week. Dx:E11.29   leuprolide, 6 Month, (LEUPROLIDE ACETATE, 6 MONTH,) 45 MG injection Inject 45 mg into the skin every 6 (six) months. Filled by [facility]   lisinopril-hydrochlorothiazide (ZESTORETIC) 20-12.5 MG  tablet TAKE 2 TABLETS BY MOUTH EVERY DAY IN THE MORNING   metoprolol tartrate (LOPRESSOR) 50 MG tablet TAKE 1 TABLET BY MOUTH TWICE A DAY   Omega-3 Fatty Acids (FISH OIL) 1000 MG CAPS Take 1 capsule by mouth daily.   pyridoxine (B-6) 100 MG tablet Take 100 mg by mouth daily.   rosuvastatin (CRESTOR) 20 MG tablet Take 1 tablet (20 mg total) by mouth daily. Must keep upcoming appt for further refills.     Allergies:   Abiraterone   Social History   Socioeconomic History   Marital status: Married    Spouse name: Not on file   Number of children: Not on file   Years of education: Not on file   Highest education level: Not on file  Occupational History   Not on file  Tobacco Use   Smoking status: Former Smoker    Years: 5.00    Types: Cigars    Quit date: 12/16/1968    Years since quitting: 51.6   Smokeless tobacco: Never Used  Vaping Use   Vaping Use: Never used  Substance and Sexual Activity   Alcohol use: No    Alcohol/week: 0.0 standard drinks   Drug use: No   Sexual activity: Never  Other Topics Concern   Not on file  Social History Narrative   Not on file   Social Determinants of Health   Financial Resource Strain:    Difficulty of Paying Living Expenses: Not on file  Food Insecurity:    Worried About Charity fundraiser in the Last Year: Not on file   [employer] of Food in the Last Year: Not on file  Transportation Needs:    Lack of Transportation (Medical): Not on file   Lack of Transportation (Non-Medical): Not on file  Physical Activity:    Days of Exercise per Week: Not on file   Minutes of Exercise per Session: Not on file  Stress:    Feeling of Stress : Not on file  Social Connections:    Frequency of Communication with Friends and Family: Not on file   Frequency of Social Gatherings with Friends and Family: Not on file   Attends Religious Services: Not on file   Active Member of Clubs or Organizations: Not on file   Attends  Archivist Meetings: Not on file   Marital Status: Not on file     Family History: The patient's family history includes Heart disease in his father; Stroke in his sister.  ROS:   Please see the history of present illness.     All other systems reviewed and are negative.  EKGs/Labs/Other Studies Reviewed:    The following studies were reviewed today: Echocardiogram 07/26/2019:  1. The left ventricle has normal systolic function, with an ejection  fraction of 55-60%. The cavity size was normal. There is severely  increased left ventricular wall thickness. Left ventricular diastolic  Doppler parameters are consistent with impaired  relaxation.  2. The right ventricle has normal systolic function.  The cavity was  normal. There is no increase in right ventricular wall thickness.  3. Left atrial size was moderately dilated.  4. The mitral valve is abnormal. Mild thickening of the mitral valve  leaflet. There is moderate mitral annular calcification present.  5. The aortic valve is tricuspid. Moderate thickening of the aortic  valve. Mild calcification of the aortic valve. Aortic valve regurgitation  is trivial by color flow Doppler. Mild stenosis of the aortic valve.  6. The aorta is normal unless otherwise noted.  7. The left ventricular function has unchanged.   EKG:  EKG is  ordered today.  The ekg ordered today demonstrates sinus bradycardia 56 with no other abnormalities.  Recent Labs: 09/24/2019: TSH 0.75 01/28/2020: ALT 9; BUN 22; Creat 1.09; Hemoglobin 11.4; Platelets 176; Potassium 4.3; Sodium 140  Recent Lipid Panel    Component Value Date/Time   CHOL 112 09/24/2019 0949   CHOL 148 04/21/2016 0804   TRIG 118 09/24/2019 0949   HDL 31 (L) 09/24/2019 0949   HDL 40 04/21/2016 0804   CHOLHDL 3.6 09/24/2019 0949   VLDL 17 06/06/2017 0816   LDLCALC 61 09/24/2019 0949    Physical Exam:    VS:  BP 132/68    Pulse (!) 56    Ht 5\' 11"  (1.803 m)    Wt 268  lb 12.8 oz (121.9 kg)    SpO2 97%    BMI 37.49 kg/m     Wt Readings from Last 3 Encounters:  07/16/20 268 lb 12.8 oz (121.9 kg)  01/28/20 274 lb 3.2 oz (124.4 kg)  09/24/19 261 lb (118.4 kg)     GEN:  Well nourished, well developed in no acute distress HEENT: Normal NECK: No JVD; No carotid bruits LYMPHATICS: No lymphadenopathy CARDIAC: RRR, 2/6 systolic murmur, no rubs, gallops RESPIRATORY:  Clear to auscultation without rales, wheezing or rhonchi  ABDOMEN: Soft, non-tender, non-distended MUSCULOSKELETAL:  No edema; No deformity  SKIN: Warm and dry NEUROLOGIC:  Alert and oriented x 3 PSYCHIATRIC:  Normal affect   ASSESSMENT:    1. Mitral annular calcification   2. Bilateral carotid artery stenosis   3. Essential hypertension, benign   4. Mixed hyperlipidemia   5. Atherosclerosis of aorta (HCC)    PLAN:    In order of problems listed above:  Mitral annular calcification -Continue to monitor.  Statin. LDL 61.  Doing well.  Mild aortic stenosis -Murmur heard on exam, continue to monitor.  Echocardiogram reviewed as above.  Hyperlipidemia -Continue with Crestor, LDL 61 from outside labs.  Hemoglobin A1c 5.8 hemoglobin 11.4 creatinine 1.09 TSH 0.75.  Carotid artery plaque -Continue with statin therapy. -Bilateral carotid artery plaque mild in 2018.  We will go ahead and repeat study.  Prostate cancer -PSA 0.01   Medication Adjustments/Labs and Tests Ordered: Current medicines are reviewed at length with the patient today.  Concerns regarding medicines are outlined above.  Orders Placed This Encounter  Procedures   EKG 12-Lead   VAS US CAROTID   No orders of the defined types were placed in this encounter.   Patient Instructions  Medication Instructions:  The current medical regimen is effective;  continue present plan and medications.  *If you need a refill on your cardiac medications before your next appointment, please call your  pharmacy*  Testing/Procedures: Your physician has requested that you have a carotid duplex. This test is an ultrasound of the carotid arteries in your neck. It looks at blood flow through these arteries that  supply the brain with blood. Allow one hour for this exam. There are no restrictions or special instructions.  Follow-Up: At [facility], you and your health needs are our priority.  As part of our continuing mission to provide you with exceptional heart care, we have created designated Provider Care Teams.  These Care Teams include your primary Cardiologist (physician) and Advanced Practice Providers (APPs -  Physician Assistants and Nurse Practitioners) who all work together to provide you with the care you need, when you need it.  We recommend signing up for the patient portal called "MyChart".  Sign up information is provided on this After Visit Summary.  MyChart is used to connect with patients for Virtual Visits (Telemedicine).  Patients are able to view lab/test results, encounter notes, upcoming appointments, etc.  Non-urgent messages can be sent to your provider as well.   To learn more about what you can do with MyChart, go to [email or web address].    Your next appointment:   12 month(s)  The format for your next appointment:   In Person  Provider:   Candee Furbish, MD   Thank you for choosing [facility]!!        Signed, Candee Furbish, MD  07/16/2020 10:13 AM    [place]

## 2020-07-16 NOTE — Patient Instructions (Signed)
Medication Instructions:  The current medical regimen is effective;  continue present plan and medications.  *If you need a refill on your cardiac medications before your next appointment, please call your pharmacy*  Testing/Procedures: Your physician has requested that you have a carotid duplex. This test is an ultrasound of the carotid arteries in your neck. It looks at blood flow through these arteries that supply the brain with blood. Allow one hour for this exam. There are no restrictions or special instructions.  Follow-Up: At [facility], you and your health needs are our priority.  As part of our continuing mission to provide you with exceptional heart care, we have created designated Provider Care Teams.  These Care Teams include your primary Cardiologist (physician) and Advanced Practice Providers (APPs -  Physician Assistants and Nurse Practitioners) who all work together to provide you with the care you need, when you need it.  We recommend signing up for the patient portal called "MyChart".  Sign up information is provided on this After Visit Summary.  MyChart is used to connect with patients for Virtual Visits (Telemedicine).  Patients are able to view lab/test results, encounter notes, upcoming appointments, etc.  Non-urgent messages can be sent to your provider as well.   To learn more about what you can do with MyChart, go to [email or web address].    Your next appointment:   12 month(s)  The format for your next appointment:   In Person  Provider:   Mark Skains, MD  Thank you for choosing [facility]!!      

## 2020-07-30 ENCOUNTER — Ambulatory Visit (INDEPENDENT_AMBULATORY_CARE_PROVIDER_SITE_OTHER): Payer: Medicare Other | Admitting: Nurse Practitioner

## 2020-07-30 ENCOUNTER — Other Ambulatory Visit: Payer: Self-pay

## 2020-07-30 ENCOUNTER — Encounter: Payer: Self-pay | Admitting: Nurse Practitioner

## 2020-07-30 VITALS — BP 130/78 | HR 67 | Temp 96.8°F | Ht 71.0 in | Wt 271.0 lb

## 2020-07-30 DIAGNOSIS — C7951 Secondary malignant neoplasm of bone: Secondary | ICD-10-CM | POA: Diagnosis not present

## 2020-07-30 DIAGNOSIS — E1142 Type 2 diabetes mellitus with diabetic polyneuropathy: Secondary | ICD-10-CM | POA: Diagnosis not present

## 2020-07-30 DIAGNOSIS — M150 Primary generalized (osteo)arthritis: Secondary | ICD-10-CM

## 2020-07-30 DIAGNOSIS — C61 Malignant neoplasm of prostate: Secondary | ICD-10-CM

## 2020-07-30 DIAGNOSIS — E669 Obesity, unspecified: Secondary | ICD-10-CM

## 2020-07-30 DIAGNOSIS — Z7189 Other specified counseling: Secondary | ICD-10-CM | POA: Diagnosis not present

## 2020-07-30 DIAGNOSIS — J439 Emphysema, unspecified: Secondary | ICD-10-CM

## 2020-07-30 DIAGNOSIS — I6523 Occlusion and stenosis of bilateral carotid arteries: Secondary | ICD-10-CM

## 2020-07-30 DIAGNOSIS — I10 Essential (primary) hypertension: Secondary | ICD-10-CM | POA: Diagnosis not present

## 2020-07-30 DIAGNOSIS — M8949 Other hypertrophic osteoarthropathy, multiple sites: Secondary | ICD-10-CM | POA: Diagnosis not present

## 2020-07-30 DIAGNOSIS — I420 Dilated cardiomyopathy: Secondary | ICD-10-CM | POA: Diagnosis not present

## 2020-07-30 DIAGNOSIS — E1169 Type 2 diabetes mellitus with other specified complication: Secondary | ICD-10-CM | POA: Diagnosis not present

## 2020-07-30 DIAGNOSIS — E119 Type 2 diabetes mellitus without complications: Secondary | ICD-10-CM

## 2020-07-30 DIAGNOSIS — E782 Mixed hyperlipidemia: Secondary | ICD-10-CM

## 2020-07-30 DIAGNOSIS — M1A9XX0 Chronic gout, unspecified, without tophus (tophi): Secondary | ICD-10-CM | POA: Diagnosis not present

## 2020-07-30 DIAGNOSIS — E1129 Type 2 diabetes mellitus with other diabetic kidney complication: Secondary | ICD-10-CM

## 2020-07-30 DIAGNOSIS — C7952 Secondary malignant neoplasm of bone marrow: Secondary | ICD-10-CM

## 2020-07-30 DIAGNOSIS — IMO0002 Reserved for concepts with insufficient information to code with codable children: Secondary | ICD-10-CM

## 2020-07-30 DIAGNOSIS — E1165 Type 2 diabetes mellitus with hyperglycemia: Secondary | ICD-10-CM | POA: Diagnosis not present

## 2020-07-30 DIAGNOSIS — M159 Polyosteoarthritis, unspecified: Secondary | ICD-10-CM

## 2020-07-30 DIAGNOSIS — Z23 Encounter for immunization: Secondary | ICD-10-CM

## 2020-07-30 NOTE — Progress Notes (Signed)
Careteam: Patient Care Team: Lauree Chandler, NP as PCP - General (Geriatric Medicine) Jerline Pain, MD as PCP - Cardiology (Cardiology) Myrlene Broker, MD as Attending Physician (Urology) [name], MD as Consulting Physician (Ophthalmology) [name], MD as Consulting Physician (Ophthalmology)  PLACE OF SERVICE:  [place]  Advanced Directive information    Allergies  Allergen Reactions   Abiraterone Nausea And Vomiting and Other (See Comments)    Other reaction(s): Increased Heart Rate (intolerance)     Chief Complaint  Patient presents with   Medical Management of Chronic Issues    6 month follow-up, fasting if labs due    Immunizations    Patient prefers to wait on flu vaccine      HPI: Patient is a 86 y.o. male for routine follow up  Pt with hx of metastatic prostate cancer followed by urology, CAD, retinal malformation, mitral annular calcification, mild aortic stenosis, hyperlipidemia on statin, htn and gout  Following with urology every 3 month due to prostate cancer requiring cystoscopy with stent exchange.   Pt recently saw cardiology to follow up on mitral annular calcification. Pt without chest pains or worsening shortness of breath.  LE edema stable.  DM- neuropathy to LE, blood sugars are controlled when   Constipation- using stool softener   Reviewed MOST from in detail  No recent gout flares, doing well on allopurinol.   Review of Systems:  Review of Systems  Constitutional: Negative for chills, fever and weight loss.  HENT: Negative for tinnitus.   Respiratory: Negative for cough, sputum production and shortness of breath.   Cardiovascular: Negative for chest pain, palpitations and leg swelling.  Gastrointestinal: Negative for abdominal pain, constipation, diarrhea and heartburn.  Genitourinary: Negative for dysuria, frequency and urgency.  Musculoskeletal: Negative for back pain, falls, joint pain and myalgias.    Skin: Negative.   Neurological: Negative for dizziness and headaches.  Psychiatric/Behavioral: Negative for depression and memory loss. The patient does not have insomnia.     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/28/2006   Bone metastases (HCC)    left mid tibial shaft   Cataract    Dr.Groat   Cellulitis and abscess of leg, except foot 01/17/2012   Chest pain, unspecified 04/28/2004   Closed fracture of five ribs 03/24/2004   First degree atrioventricular block 04/25/2007   Gross hematuria 09/06/2011   History of radiation therapy 04/03/14- 04/17/14   mid to distal left tibia 3000 cGy in 10 sessions   HTN (hypertension)    Hx of radiation therapy 11/1998   prostate fossa - 6040 cGy, 33 fx, Dr Danny Lawless   Osteoarthrosis involving, or with mention of more than one site, but not specified as generalized, multiple sites 10/22/2010   Other abnormal blood chemistry 04/29/1991   Other and unspecified hyperlipidemia 01/02/2013   Palpitations 04/28/2004   Prostate cancer ([place]) 12/16/1994   gleason 7   Reflux esophagitis 05/10/2008   Routine general medical examination at a health care facility    Spinal stenosis, unspecified region other than cervical 04/29/1995   Type II or unspecified type diabetes mellitus without mention of complication, uncontrolled    Past Surgical History:  Procedure Laterality Date   AIR/FLUID EXCHANGE Left 01/09/2016   Procedure: AIR/FLUID EXCHANGE;  Surgeon: Jalene Mullet, MD;  Location: [place];  Service: Ophthalmology;  Laterality: Left;   CYSTOSCOPY W/ URETERAL STENT PLACEMENT  12/31/14   CYSTOSCOPY W/ URETERAL  STENT PLACEMENT  05/16/2015   EYE SURGERY  2006   cataract, Dr Shanon Rosser   LASER PHOTO ABLATION Left 01/09/2016   Procedure: LASER PHOTO ABLATION;  Surgeon: Jalene Mullet, MD;  Location: [place];  Service: Ophthalmology;  Laterality: Left;  Endolaser   Left ureteral stent placement   Week of 12/05/11   PARS PLANA VITRECTOMY Left 01/09/2016   Procedure: PARS PLANA VITRECTOMY WITH 25 GAUGE LEFT EYE ;  Surgeon: Jalene Mullet, MD;  Location: [place];  Service: Ophthalmology;  Laterality: Left;   PERFLUORONE INJECTION Left 01/09/2016   Procedure: PERFLUORONE INJECTION;  Surgeon: Jalene Mullet, MD;  Location: [place];  Service: Ophthalmology;  Laterality: Left;   PROSTATECTOMY  1996   Dr. Rosana Hoes   URETERAL STENT PLACEMENT  05/2020   [facility] every 3 months    Social History:   reports that he quit smoking about 51 years ago. His smoking use included cigars. He quit after 5.00 years of use. He has never used smokeless tobacco. He reports that he does not drink alcohol and does not use drugs.  Family History  Problem Relation Age of Onset   Heart disease Father    Stroke Sister     Medications: Patient's Medications  New Prescriptions   No medications on file  Previous Medications   ACETAMINOPHEN (TYLENOL) 650 MG CR TABLET    Take 650 mg by mouth as needed for pain.   ALLOPURINOL (ZYLOPRIM) 100 MG TABLET    TAKE 2 TABLETS BY MOUTH EVERY DAY   ASPIRIN 81 MG TABLET    Take 1 tablet (81 mg total) by mouth daily.   CALCIUM CARBONATE (TUMS - DOSED IN MG ELEMENTAL CALCIUM) 500 MG CHEWABLE TABLET    Chew 1 tablet by mouth as needed for indigestion or heartburn.   CHOLECALCIFEROL 1000 UNITS TABLET    Take 1,000 Units by mouth daily.   DOXAZOSIN (CARDURA) 4 MG TABLET    TAKE 1 TABLET BY MOUTH EVERY DAY   ENZALUTAMIDE (XTANDI) 40 MG CAPSULE    Take 120 mg by mouth daily.    GLUCOSE BLOOD (ONETOUCH VERIO) TEST STRIP    Check blood sugar one to two times a week. Dx:E11.29   LEUPROLIDE, 6 MONTH, (LEUPROLIDE ACETATE, 6 MONTH,) 45 MG INJECTION    Inject 45 mg into the skin every 6 (six) months. Filled by [facility] (ZESTORETIC) 20-12.5 MG TABLET    TAKE 2 TABLETS BY MOUTH EVERY DAY IN THE MORNING   METOPROLOL TARTRATE (LOPRESSOR) 50 MG TABLET     TAKE 1 TABLET BY MOUTH TWICE A DAY   OMEGA-3 FATTY ACIDS (FISH OIL) 1000 MG CAPS    Take 1 capsule by mouth daily.   PYRIDOXINE (B-6) 100 MG TABLET    Take 100 mg by mouth daily.   ROSUVASTATIN (CRESTOR) 20 MG TABLET    Take 1 tablet (20 mg total) by mouth daily. Must keep upcoming appt for further refills.  Modified Medications   No medications on file  Discontinued Medications   No medications on file    Physical Exam:  Vitals:   07/30/20 0841  BP: 130/78  Pulse: 67  Temp: (!) 96.8 F (36 C)  TempSrc: Temporal  SpO2: 96%  Weight: 271 lb (122.9 kg)  Height: 5' 11"  (1.803 m)   Body mass index is 37.8 kg/m. Wt Readings from Last 3 Encounters:  07/30/20 271 lb (122.9 kg)  07/16/20 268 lb 12.8 oz (121.9 kg)  01/28/20 274 lb  3.2 oz (124.4 kg)    Physical Exam Constitutional:      General: He is not in acute distress.    Appearance: He is well-developed. He is not diaphoretic.  HENT:     Head: Normocephalic and atraumatic.     Mouth/Throat:     Pharynx: No oropharyngeal exudate.  Eyes:     Conjunctiva/sclera: Conjunctivae normal.     Pupils: Pupils are equal, round, and reactive to light.  Cardiovascular:     Rate and Rhythm: Normal rate and regular rhythm.     Pulses:          Dorsalis pedis pulses are 1+ on the right side and 1+ on the left side.       Posterior tibial pulses are 1+ on the right side and 1+ on the left side.     Heart sounds: Normal heart sounds.  Pulmonary:     Effort: Pulmonary effort is normal.     Breath sounds: Normal breath sounds.  Abdominal:     General: Bowel sounds are normal.     Palpations: Abdomen is soft.  Musculoskeletal:        General: No tenderness.     Cervical back: Normal range of motion and neck supple.  Feet:     Right foot:     Protective Sensation: 4 sites tested. 3 sites sensed.     Skin integrity: Dry skin present.     Toenail Condition: Right toenails are abnormally thick and long. Fungal disease present.    Left  foot:     Protective Sensation: 4 sites tested. 3 sites sensed.     Skin integrity: Dry skin present.     Toenail Condition: Left toenails are abnormally thick and long. Fungal disease present. Skin:    General: Skin is warm and dry.  Neurological:     Mental Status: He is alert and oriented to person, place, and time.     Labs reviewed: Basic Metabolic Panel: Recent Labs    08/07/19 0827 09/24/19 0946 09/24/19 0949 01/28/20 0926  NA 140  --  139 140  K 4.4  --  3.6 4.3  CL 107  --  107 108  CO2 26  --  23 24  GLUCOSE 129*  --  125* 132*  BUN 22  --  33* 22  CREATININE 1.17*  --  1.21* 1.09  CALCIUM 9.0  --  8.7 8.7  TSH  --  0.75  --   --    Liver Function Tests: Recent Labs    09/24/19 0949 01/28/20 0926  AST 12 12  ALT 11 9  BILITOT 0.4 0.3  PROT 5.6* 5.7*   No results for input(s): LIPASE, AMYLASE in the last 8760 hours. No results for input(s): AMMONIA in the last 8760 hours. CBC: Recent Labs    09/24/19 0949 01/28/20 0926  WBC 6.2 7.6  NEUTROABS 3,863 5,160  HGB 11.4* 11.4*  HCT 33.7* 34.4*  MCV 86.2 84.5  PLT 184 176   Lipid Panel: Recent Labs    09/24/19 0949  CHOL 112  HDL 31*  LDLCALC 61  TRIG 118  CHOLHDL 3.6   TSH: Recent Labs    09/24/19 0946  TSH 0.75   A1C: Lab Results  Component Value Date   HGBA1C 5.8 (H) 01/28/2020     Assessment/Plan 1. DM type 2 with diabetic peripheral neuropathy (HCC) Reports blood sugars controlled with home readings.  Encouraged dietary compliance, routine foot care/monitoring and to  keep up with diabetic eye exams through ophthalmology  - Hemoglobin A1c - Flu Vaccine QUAD High Dose(Fluad) - Ambulatory referral to Podiatry  2. DM type 2, uncontrolled, with renal complications ([place]) -last A1c controlled, Encourage proper hydration and to avoid NSAIDS (Aleve, Advil, Motrin, Ibuprofen)  - Hemoglobin A1c - Ambulatory referral to Podiatry  3. Diabetic polyneuropathy associated with type 2  diabetes mellitus ([place]) Stable at this time.  - Hemoglobin A1c - Ambulatory referral to Podiatry  4. Diabetes mellitus type 2 in obese ([place]) -encouraged healthy weight loss with dietary modifications due to diabetes and hyperlipidemia.  - Hemoglobin A1c  5. Secondary malignant neoplasm of bone and bone marrow (HCC) -stable, followed by urology and oncology without worsening pain.   6. Prostate cancer [facility]) Ongoing followed by urology, continues on enzalutamide   7. Chronic gout without tophus, unspecified cause, unspecified site Stable, continue on allopurinol 200 mg daily without recent flare.    8. Primary osteoarthritis involving multiple joints Stable, continues on tylenol PRN  9. Mixed hyperlipidemia - continues on crestor, encouraged dietary modifications  - Lipid Panel - CMP with eGFR([facility])  10. Essential hypertension, benign -controlled on current regimen.  - CBC with Differential/Platelet - CMP with eGFR([facility])  11. Congestive dilated cardiomyopathy (HCC) Stable without worsening chest pain, shortness of breath or edema, continues on lisinipril-hctz.  12. Pulmonary emphysema, unspecified emphysema type ([place]) Stable, not currently on medications  13. Advance care planning Spend extensive amount of time educating about CPR/intubation and prolonging life in the even he should have a cardiac arrest. he wants aggressive care no matter what happens at this time even with his underlying conditions.    14. Need for influenza vaccination - Flu Vaccine QUAD High Dose(Fluad)  Next appt: 6 months, sooner if needed  Colman Birdwell K. [place], [facility] [phone]

## 2020-07-31 LAB — COMPLETE METABOLIC PANEL WITH GFR
AG Ratio: 1.8 (calc) (ref 1.0–2.5)
ALT: 7 U/L — ABNORMAL LOW (ref 9–46)
AST: 9 U/L — ABNORMAL LOW (ref 10–35)
Albumin: 3.6 g/dL (ref 3.6–5.1)
Alkaline phosphatase (APISO): 96 U/L (ref 35–144)
BUN/Creatinine Ratio: 21 (calc) (ref 6–22)
BUN: 24 mg/dL (ref 7–25)
CO2: 23 mmol/L (ref 20–32)
Calcium: 8.9 mg/dL (ref 8.6–10.3)
Chloride: 108 mmol/L (ref 98–110)
Creat: 1.12 mg/dL — ABNORMAL HIGH (ref 0.70–1.11)
GFR, Est African American: 70 mL/min/{1.73_m2} (ref >=60)
GFR, Est Non African American: 60 mL/min/{1.73_m2} (ref >=60)
Globulin: 2 g/dL (calc) (ref 1.9–3.7)
Glucose, Bld: 124 mg/dL — ABNORMAL HIGH (ref 65–99)
Potassium: 4.3 mmol/L (ref 3.5–5.3)
Sodium: 139 mmol/L (ref 135–146)
Total Bilirubin: 0.4 mg/dL (ref 0.2–1.2)
Total Protein: 5.6 g/dL — ABNORMAL LOW (ref 6.1–8.1)

## 2020-07-31 LAB — HEMOGLOBIN A1C
Hgb A1c MFr Bld: 6 % of total Hgb — ABNORMAL HIGH (ref <5.7)
Mean Plasma Glucose: 126 (calc)
eAG (mmol/L): 7 (calc)

## 2020-07-31 LAB — LIPID PANEL
Cholesterol: 112 mg/dL (ref <200)
HDL: 41 mg/dL (ref >=40)
LDL Cholesterol (Calc): 53 mg/dL (calc)
Non-HDL Cholesterol (Calc): 71 mg/dL (calc) (ref <130)
Total CHOL/HDL Ratio: 2.7 (calc) (ref <5.0)
Triglycerides: 96 mg/dL (ref <150)

## 2020-07-31 LAB — CBC WITH DIFFERENTIAL/PLATELET
Absolute Monocytes: 400 cells/uL (ref 200–950)
Basophils Absolute: 39 cells/uL (ref 0–200)
Basophils Relative: 0.5 %
Eosinophils Absolute: 100 cells/uL (ref 15–500)
Eosinophils Relative: 1.3 %
HCT: 32.9 % — ABNORMAL LOW (ref 38.5–50.0)
Hemoglobin: 11 g/dL — ABNORMAL LOW (ref 13.2–17.1)
Lymphs Abs: 2125 cells/uL (ref 850–3900)
MCH: 28.6 pg (ref 27.0–33.0)
MCHC: 33.4 g/dL (ref 32.0–36.0)
MCV: 85.5 fL (ref 80.0–100.0)
MPV: 9.8 fL (ref 7.5–12.5)
Monocytes Relative: 5.2 %
Neutro Abs: 5036 cells/uL (ref 1500–7800)
Neutrophils Relative %: 65.4 %
Platelets: 154 10*3/uL (ref 140–400)
RBC: 3.85 10*6/uL — ABNORMAL LOW (ref 4.20–5.80)
RDW: 15.5 % — ABNORMAL HIGH (ref 11.0–15.0)
Total Lymphocyte: 27.6 %
WBC: 7.7 10*3/uL (ref 3.8–10.8)

## 2020-08-12 ENCOUNTER — Other Ambulatory Visit: Payer: Self-pay

## 2020-08-12 ENCOUNTER — Ambulatory Visit (HOSPITAL_COMMUNITY)
Admission: RE | Admit: 2020-08-12 | Discharge: 2020-08-12 | Disposition: A | Discharge status: Home / Self Care | Payer: Medicare Other | Source: Ambulatory Visit | Attending: Cardiovascular Disease | Admitting: Cardiovascular Disease

## 2020-08-12 DIAGNOSIS — I6523 Occlusion and stenosis of bilateral carotid arteries: Secondary | ICD-10-CM | POA: Diagnosis not present

## 2020-08-18 ENCOUNTER — Other Ambulatory Visit: Payer: Self-pay

## 2020-08-18 ENCOUNTER — Ambulatory Visit (INDEPENDENT_AMBULATORY_CARE_PROVIDER_SITE_OTHER): Payer: Medicare Other | Admitting: Podiatry

## 2020-08-18 DIAGNOSIS — M79675 Pain in left toe(s): Secondary | ICD-10-CM

## 2020-08-18 DIAGNOSIS — L989 Disorder of the skin and subcutaneous tissue, unspecified: Secondary | ICD-10-CM

## 2020-08-18 DIAGNOSIS — E0843 Diabetes mellitus due to underlying condition with diabetic autonomic (poly)neuropathy: Secondary | ICD-10-CM

## 2020-08-18 DIAGNOSIS — B351 Tinea unguium: Secondary | ICD-10-CM | POA: Diagnosis not present

## 2020-08-18 DIAGNOSIS — M79674 Pain in right toe(s): Secondary | ICD-10-CM | POA: Diagnosis not present

## 2020-08-18 NOTE — Progress Notes (Signed)
   SUBJECTIVE Patient with a history of diabetes mellitus presents to office today complaining of elongated, thickened nails that cause pain while ambulating in shoes.  He is unable to trim his own nails. Patient is here for further evaluation and treatment.   Past Medical History:  Diagnosis Date  . Acute bronchitis 09/12/2012  . 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 Bone metastases ([place])    left mid tibial shaft  . Cataract    Dr.Groat  . Cellulitis and abscess of leg, except foot 01/17/2012  . Chest pain, unspecified 04/28/2004  . Closed fracture of five ribs 03/24/2004  . First degree atrioventricular block 04/25/2007  . Gross hematuria 09/06/2011  . History of radiation therapy 04/03/14- 04/17/14   mid to distal left tibia 3000 cGy in 10 sessions  . HTN (hypertension)   . Hx of radiation therapy 11/1998   prostate fossa - 6040 cGy, 33 fx, Dr Danny Lawless  . Osteoarthrosis involving, or with mention of more than one site, but not specified as generalized, multiple sites 10/22/2010  . Other abnormal blood chemistry 04/29/1991  . Other and unspecified hyperlipidemia 01/02/2013  . Palpitations 04/28/2004  . Prostate cancer ([place]) 12/16/1994   gleason 7  . Reflux esophagitis 05/10/2008  . Routine general medical examination at a health care facility   . Spinal stenosis, unspecified region other than cervical 04/29/1995  . Type II or unspecified type diabetes mellitus without mention of complication, uncontrolled     OBJECTIVE General Patient is awake, alert, and oriented x 3 and in no acute distress. Derm Skin is dry and supple bilateral. Negative open lesions or macerations. Remaining integument unremarkable. Nails are tender, long, thickened and dystrophic with subungual debris, consistent with onychomycosis, 1-5 bilateral. No signs of infection noted.  Hyperkeratotic preulcerative callus lesions also noted to the bilateral tips of the toes Vasc  DP and PT pedal pulses palpable bilaterally.  Temperature gradient within normal limits.  Neuro Epicritic and protective threshold sensation diminished bilaterally.  Musculoskeletal Exam No symptomatic pedal deformities noted bilateral. Muscular strength within normal limits.  ASSESSMENT 1. Diabetes Mellitus w/ peripheral neuropathy 2. Onychomycosis of nail due to dermatophyte bilateral 3. Pain in foot bilateral 4.  Preulcerative callus lesions bilateral feet  PLAN OF CARE 1. Patient evaluated today. 2. Instructed to maintain good pedal hygiene and foot care. Stressed importance of controlling blood sugar.  3. Mechanical debridement of nails 1-5 bilaterally performed using a nail nipper. Filed with dremel without incident.  4.  Excisional debridement of the preulcerative callus lesions was performed using a tissue nipper without incident or bleeding  5.  Return to clinic in 3 mos.     Edrick Kins, DPM [facility]  Dr. Edrick Kins, [address]                                        [place], [place] [address]                Office [phone]  Fax [phone]     [employer]

## 2020-08-20 DIAGNOSIS — C44311 Basal cell carcinoma of skin of nose: Secondary | ICD-10-CM | POA: Diagnosis not present

## 2020-09-02 DIAGNOSIS — Z192 Hormone resistant malignancy status: Secondary | ICD-10-CM | POA: Diagnosis not present

## 2020-09-02 DIAGNOSIS — Z87891 Personal history of nicotine dependence: Secondary | ICD-10-CM | POA: Diagnosis not present

## 2020-09-02 DIAGNOSIS — Z79899 Other long term (current) drug therapy: Secondary | ICD-10-CM | POA: Diagnosis not present

## 2020-09-02 DIAGNOSIS — G629 Polyneuropathy, unspecified: Secondary | ICD-10-CM | POA: Diagnosis not present

## 2020-09-02 DIAGNOSIS — C61 Malignant neoplasm of prostate: Secondary | ICD-10-CM | POA: Diagnosis not present

## 2020-09-02 DIAGNOSIS — I10 Essential (primary) hypertension: Secondary | ICD-10-CM | POA: Diagnosis not present

## 2020-09-02 DIAGNOSIS — D649 Anemia, unspecified: Secondary | ICD-10-CM | POA: Diagnosis not present

## 2020-09-02 DIAGNOSIS — C7951 Secondary malignant neoplasm of bone: Secondary | ICD-10-CM | POA: Diagnosis not present

## 2020-09-02 DIAGNOSIS — T386X5S Adverse effect of antigonadotrophins, antiestrogens, antiandrogens, not elsewhere classified, sequela: Secondary | ICD-10-CM | POA: Diagnosis not present

## 2020-09-02 DIAGNOSIS — R2681 Unsteadiness on feet: Secondary | ICD-10-CM | POA: Diagnosis not present

## 2020-09-02 DIAGNOSIS — M810 Age-related osteoporosis without current pathological fracture: Secondary | ICD-10-CM | POA: Diagnosis not present

## 2020-09-23 DIAGNOSIS — E1140 Type 2 diabetes mellitus with diabetic neuropathy, unspecified: Secondary | ICD-10-CM | POA: Diagnosis not present

## 2020-09-23 DIAGNOSIS — E785 Hyperlipidemia, unspecified: Secondary | ICD-10-CM | POA: Diagnosis not present

## 2020-09-23 DIAGNOSIS — Z9079 Acquired absence of other genital organ(s): Secondary | ICD-10-CM | POA: Diagnosis not present

## 2020-09-23 DIAGNOSIS — Z466 Encounter for fitting and adjustment of urinary device: Secondary | ICD-10-CM | POA: Diagnosis not present

## 2020-09-23 DIAGNOSIS — M25572 Pain in left ankle and joints of left foot: Secondary | ICD-10-CM | POA: Diagnosis not present

## 2020-09-23 DIAGNOSIS — C61 Malignant neoplasm of prostate: Secondary | ICD-10-CM | POA: Diagnosis not present

## 2020-09-23 DIAGNOSIS — E1122 Type 2 diabetes mellitus with diabetic chronic kidney disease: Secondary | ICD-10-CM | POA: Diagnosis not present

## 2020-09-23 DIAGNOSIS — N183 Chronic kidney disease, stage 3 unspecified: Secondary | ICD-10-CM | POA: Diagnosis not present

## 2020-09-23 DIAGNOSIS — R2681 Unsteadiness on feet: Secondary | ICD-10-CM | POA: Diagnosis not present

## 2020-09-23 DIAGNOSIS — N320 Bladder-neck obstruction: Secondary | ICD-10-CM | POA: Diagnosis not present

## 2020-09-23 DIAGNOSIS — Z87891 Personal history of nicotine dependence: Secondary | ICD-10-CM | POA: Diagnosis not present

## 2020-09-23 DIAGNOSIS — N35919 Unspecified urethral stricture, male, unspecified site: Secondary | ICD-10-CM | POA: Diagnosis not present

## 2020-09-23 DIAGNOSIS — C7951 Secondary malignant neoplasm of bone: Secondary | ICD-10-CM | POA: Diagnosis not present

## 2020-09-23 DIAGNOSIS — G609 Hereditary and idiopathic neuropathy, unspecified: Secondary | ICD-10-CM | POA: Diagnosis not present

## 2020-09-23 DIAGNOSIS — N135 Crossing vessel and stricture of ureter without hydronephrosis: Secondary | ICD-10-CM | POA: Diagnosis not present

## 2020-09-23 DIAGNOSIS — N3289 Other specified disorders of bladder: Secondary | ICD-10-CM | POA: Diagnosis not present

## 2020-09-23 DIAGNOSIS — I129 Hypertensive chronic kidney disease with stage 1 through stage 4 chronic kidney disease, or unspecified chronic kidney disease: Secondary | ICD-10-CM | POA: Diagnosis not present

## 2020-09-23 DIAGNOSIS — M109 Gout, unspecified: Secondary | ICD-10-CM | POA: Diagnosis not present

## 2020-09-23 DIAGNOSIS — N393 Stress incontinence (female) (male): Secondary | ICD-10-CM | POA: Diagnosis not present

## 2020-09-23 DIAGNOSIS — K219 Gastro-esophageal reflux disease without esophagitis: Secondary | ICD-10-CM | POA: Diagnosis not present

## 2020-09-23 DIAGNOSIS — I6523 Occlusion and stenosis of bilateral carotid arteries: Secondary | ICD-10-CM | POA: Diagnosis not present

## 2020-09-23 DIAGNOSIS — G8929 Other chronic pain: Secondary | ICD-10-CM | POA: Diagnosis not present

## 2020-09-23 DIAGNOSIS — D649 Anemia, unspecified: Secondary | ICD-10-CM | POA: Diagnosis not present

## 2020-09-23 DIAGNOSIS — R600 Localized edema: Secondary | ICD-10-CM | POA: Diagnosis not present

## 2020-09-23 DIAGNOSIS — R011 Cardiac murmur, unspecified: Secondary | ICD-10-CM | POA: Diagnosis not present

## 2020-09-23 HISTORY — PX: URETERAL STENT PLACEMENT: SHX822

## 2020-09-24 ENCOUNTER — Ambulatory Visit (INDEPENDENT_AMBULATORY_CARE_PROVIDER_SITE_OTHER): Payer: Medicare Other | Admitting: Nurse Practitioner

## 2020-09-24 ENCOUNTER — Encounter: Payer: Self-pay | Admitting: Nurse Practitioner

## 2020-09-24 ENCOUNTER — Telehealth: Payer: Self-pay

## 2020-09-24 ENCOUNTER — Other Ambulatory Visit: Payer: Self-pay

## 2020-09-24 DIAGNOSIS — Z0000 Encounter for general adult medical examination without abnormal findings: Secondary | ICD-10-CM | POA: Diagnosis not present

## 2020-09-24 NOTE — Progress Notes (Signed)
   This service is provided via telemedicine  No vital signs collected/recorded due to the encounter was a telemedicine visit.   Location of patient (ex: home, work):  Home  Patient consents to a telephone visit:  Yes, see telephone encounter dated 09/24/2020 with annual consent   Location of the provider (ex: office, home):  [facility], Office   Name of any referring provider:  n/a  Names of all persons participating in the telemedicine service and their role in the encounter:  S.Chrae B/CMA, Sherrie Mustache, NP, and Patient   Time spent on call: 9 min with medical assistant

## 2020-09-24 NOTE — Patient Instructions (Signed)
Mr. Mcquitty , Thank you for taking time to come for your Medicare Wellness Visit. I appreciate your ongoing commitment to your health goals. Please review the following plan we discussed and let me know if I can assist you in the future.   Screening recommendations/referrals: Colonoscopy aged out Recommended yearly ophthalmology/optometry visit for glaucoma screening and checkup Recommended yearly dental visit for hygiene and checkup  Vaccinations: Influenza vaccine up to date Pneumococcal vaccine up to date Tdap vaccine up to date Shingles vaccine RECOMMENDED- to get shingrix at local pharmacy   Advanced directives: recommended to complete and bring to the office.   Conditions/risks identified: obesity, diabetes, fall risk, hypertension, hyperlipidemia  Next appointment: 1 year   Preventive Care 38 Years and Older, Male Preventive care refers to lifestyle choices and visits with your health care provider that can promote health and wellness. What does preventive care include?  A yearly physical exam. This is also called an annual well check.  Dental exams once or twice a year.  Routine eye exams. Ask your health care provider how often you should have your eyes checked.  Personal lifestyle choices, including:  Daily care of your teeth and gums.  Regular physical activity.  Eating a healthy diet.  Avoiding tobacco and drug use.  Limiting alcohol use.  Practicing safe sex.  Taking low doses of aspirin every day.  Taking vitamin and mineral supplements as recommended by your health care provider. What happens during an annual well check? The services and screenings done by your health care provider during your annual well check will depend on your age, overall health, lifestyle risk factors, and family history of disease. Counseling  Your health care provider may ask you questions about your:  Alcohol use.  Tobacco use.  Drug use.  Emotional well-being.  Home  and relationship well-being.  Sexual activity.  Eating habits.  History of falls.  Memory and ability to understand (cognition).  Work and work Statistician. Screening  You may have the following tests or measurements:  Height, weight, and BMI.  Blood pressure.  Lipid and cholesterol levels. These may be checked every 5 years, or more frequently if you are over 34 years old.  Skin check.  Lung cancer screening. You may have this screening every year starting at age 46 if you have a 30-pack-year history of smoking and currently smoke or have quit within the past 15 years.  Fecal occult blood test (FOBT) of the stool. You may have this test every year starting at age 41.  Flexible sigmoidoscopy or colonoscopy. You may have a sigmoidoscopy every 5 years or a colonoscopy every 10 years starting at age 18.  Prostate cancer screening. Recommendations will vary depending on your family history and other risks.  Hepatitis C blood test.  Hepatitis B blood test.  Sexually transmitted disease (STD) testing.  Diabetes screening. This is done by checking your blood sugar (glucose) after you have not eaten for a while (fasting). You may have this done every 1-3 years.  Abdominal aortic aneurysm (AAA) screening. You may need this if you are a current or former smoker.  Osteoporosis. You may be screened starting at age 35 if you are at high risk. Talk with your health care provider about your test results, treatment options, and if necessary, the need for more tests. Vaccines  Your health care provider may recommend certain vaccines, such as:  Influenza vaccine. This is recommended every year.  Tetanus, diphtheria, and acellular pertussis (Tdap, Td) vaccine.  You may need a Td booster every 10 years.  Zoster vaccine. You may need this after age 37.  Pneumococcal 13-valent conjugate (PCV13) vaccine. One dose is recommended after age 48.  Pneumococcal polysaccharide (PPSV23) vaccine.  One dose is recommended after age 9. Talk to your health care provider about which screenings and vaccines you need and how often you need them. This information is not intended to replace advice given to you by your health care provider. Make sure you discuss any questions you have with your health care provider. Document Released: 11/28/2015 Document Revised: 07/21/2016 Document Reviewed: 09/02/2015 Elsevier Interactive Patient Education  2017 [employer] Prevention in the Home Falls can cause injuries. They can happen to people of all ages. There are many things you can do to make your home safe and to help prevent falls. What can I do on the outside of my home?  Regularly fix the edges of walkways and driveways and fix any cracks.  Remove anything that might make you trip as you walk through a door, such as a raised step or threshold.  Trim any bushes or trees on the path to your home.  Use bright outdoor lighting.  Clear any walking paths of anything that might make someone trip, such as rocks or tools.  Regularly check to see if handrails are loose or broken. Make sure that both sides of any steps have handrails.  Any raised decks and porches should have guardrails on the edges.  Have any leaves, snow, or ice cleared regularly.  Use sand or salt on walking paths during winter.  Clean up any spills in your garage right away. This includes oil or grease spills. What can I do in the bathroom?  Use night lights.  Install grab bars by the toilet and in the tub and shower. Do not use towel bars as grab bars.  Use non-skid mats or decals in the tub or shower.  If you need to sit down in the shower, use a plastic, non-slip stool.  Keep the floor dry. Clean up any water that spills on the floor as soon as it happens.  Remove soap buildup in the tub or shower regularly.  Attach bath mats securely with double-sided non-slip rug tape.  Do not have throw rugs and other  things on the floor that can make you trip. What can I do in the bedroom?  Use night lights.  Make sure that you have a light by your bed that is easy to reach.  Do not use any sheets or blankets that are too big for your bed. They should not hang down onto the floor.  Have a firm chair that has side arms. You can use this for support while you get dressed.  Do not have throw rugs and other things on the floor that can make you trip. What can I do in the kitchen?  Clean up any spills right away.  Avoid walking on wet floors.  Keep items that you use a lot in easy-to-reach places.  If you need to reach something above you, use a strong step stool that has a grab bar.  Keep electrical cords out of the way.  Do not use floor polish or wax that makes floors slippery. If you must use wax, use non-skid floor wax.  Do not have throw rugs and other things on the floor that can make you trip. What can I do with my stairs?  Do not leave any  items on the stairs.  Make sure that there are handrails on both sides of the stairs and use them. Fix handrails that are broken or loose. Make sure that handrails are as long as the stairways.  Check any carpeting to make sure that it is firmly attached to the stairs. Fix any carpet that is loose or worn.  Avoid having throw rugs at the top or bottom of the stairs. If you do have throw rugs, attach them to the floor with carpet tape.  Make sure that you have a light switch at the top of the stairs and the bottom of the stairs. If you do not have them, ask someone to add them for you. What else can I do to help prevent falls?  Wear shoes that:  Do not have high heels.  Have rubber bottoms.  Are comfortable and fit you well.  Are closed at the toe. Do not wear sandals.  If you use a stepladder:  Make sure that it is fully opened. Do not climb a closed stepladder.  Make sure that both sides of the stepladder are locked into place.  Ask  someone to hold it for you, if possible.  Clearly mark and make sure that you can see:  Any grab bars or handrails.  First and last steps.  Where the edge of each step is.  Use tools that help you move around (mobility aids) if they are needed. These include:  Canes.  Walkers.  Scooters.  Crutches.  Turn on the lights when you go into a dark area. Replace any light bulbs as soon as they burn out.  Set up your furniture so you have a clear path. Avoid moving your furniture around.  If any of your floors are uneven, fix them.  If there are any pets around you, be aware of where they are.  Review your medicines with your doctor. Some medicines can make you feel dizzy. This can increase your chance of falling. Ask your doctor what other things that you can do to help prevent falls. This information is not intended to replace advice given to you by your health care provider. Make sure you discuss any questions you have with your health care provider. Document Released: 08/28/2009 Document Revised: 04/08/2016 Document Reviewed: 12/06/2014 Elsevier Interactive Patient Education  2017 [employer].

## 2020-09-24 NOTE — Progress Notes (Signed)
Subjective:   Jeremiah Collins is a 86 y.o. male who presents for Medicare Annual/Subsequent preventive examination.  Review of Systems     Cardiac Risk Factors include: advanced age (>29men, >84 women);hypertension;dyslipidemia;diabetes mellitus;male gender;obesity (BMI >30kg/m2)     Objective:    There were no vitals filed for this visit. There is no height or weight on file to calculate BMI.  Advanced Directives 09/24/2020 01/28/2020 09/24/2019 09/24/2019 06/22/2019 04/03/2019 03/16/2019  Does Patient Have a Medical Advance Directive? Yes No No No No No No  Type of Advance Directive Out of facility DNR (pink MOST or yellow form) - - - - - -  Does patient want to make changes to medical advance directive? No - Patient declined - - - - No - Patient declined No - Patient declined  Would patient like information on creating a medical advance directive? - No - Patient declined No - Patient declined No - Patient declined - - -    Current Medications (verified) Outpatient Encounter Medications as of 09/24/2020  Medication Sig  . acetaminophen (TYLENOL) 650 MG CR tablet Take 650 mg by mouth as needed for pain.  Marland Kitchen allopurinol (ZYLOPRIM) 100 MG tablet TAKE 2 TABLETS BY MOUTH EVERY DAY  . aspirin 81 MG tablet Take 1 tablet (81 mg total) by mouth daily.  . calcium carbonate (TUMS - DOSED IN MG ELEMENTAL CALCIUM) 500 MG chewable tablet Chew 1 tablet by mouth as needed for indigestion or heartburn.  . Cholecalciferol 1000 UNITS tablet Take 1,000 Units by mouth daily.  Marland Kitchen doxazosin (CARDURA) 4 MG tablet TAKE 1 TABLET BY MOUTH EVERY DAY  . enzalutamide (XTANDI) 40 MG capsule Take 80 mg by mouth daily.   . ferrous sulfate 325 (65 FE) MG EC tablet Take 325 mg by mouth every other day.  Marland Kitchen glucose blood (ONETOUCH VERIO) test strip Check blood sugar one to two times a week. Dx:E11.29  . leuprolide, 6 Month, (LEUPROLIDE ACETATE, 6 MONTH,) 45 MG injection Inject 45 mg into the skin every 6 (six) months. Filled  by [facility]  . lisinopril-hydrochlorothiazide (ZESTORETIC) 20-12.5 MG tablet TAKE 2 TABLETS BY MOUTH EVERY DAY IN THE MORNING  . metoprolol tartrate (LOPRESSOR) 50 MG tablet TAKE 1 TABLET BY MOUTH TWICE A DAY  . Omega-3 Fatty Acids (FISH OIL) 1000 MG CAPS Take 1 capsule by mouth daily.  Marland Kitchen pyridoxine (B-6) 100 MG tablet Take 100 mg by mouth daily.  . rosuvastatin (CRESTOR) 20 MG tablet Take 1 tablet (20 mg total) by mouth daily. Must keep upcoming appt for further refills.   No facility-administered encounter medications on file as of 09/24/2020.    Allergies (verified) Abiraterone   History: Past Medical History:  Diagnosis Date  . Acute bronchitis 09/12/2012  . 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 Bone metastases ([place])    left mid tibial shaft  . Cataract    Dr.Groat  . Cellulitis and abscess of leg, except foot 01/17/2012  . Chest pain, unspecified 04/28/2004  . Closed fracture of five ribs 03/24/2004  . First degree atrioventricular block 04/25/2007  . Gross hematuria 09/06/2011  . History of radiation therapy 04/03/14- 04/17/14   mid to distal left tibia 3000 cGy in 10 sessions  . HTN (hypertension)   . Hx of radiation therapy 11/1998   prostate fossa - 6040 cGy, 33 fx, Dr Danny Lawless  . Osteoarthrosis involving, or with mention of more than one site, but not specified as generalized, multiple sites 10/22/2010  . Other abnormal blood chemistry  04/29/1991  . Other and unspecified hyperlipidemia 01/02/2013  . Palpitations 04/28/2004  . Prostate cancer ([place]) 12/16/1994   gleason 7  . Reflux esophagitis 05/10/2008  . Routine general medical examination at a health care facility   . Spinal stenosis, unspecified region other than cervical 04/29/1995  . Type II or unspecified type diabetes mellitus without mention of complication, uncontrolled    Past Surgical History:  Procedure Laterality Date  . AIR/FLUID EXCHANGE Left 01/09/2016   Procedure: AIR/FLUID EXCHANGE;  Surgeon:  Jalene Mullet, MD;  Location: [place];  Service: Ophthalmology;  Laterality: Left;  . CYSTOSCOPY W/ URETERAL STENT PLACEMENT  12/31/14  . CYSTOSCOPY W/ URETERAL STENT PLACEMENT  05/16/2015  . EYE SURGERY  2006   cataract, Dr Shanon Rosser  . LASER PHOTO ABLATION Left 01/09/2016   Procedure: LASER PHOTO ABLATION;  Surgeon: Jalene Mullet, MD;  Location: [place];  Service: Ophthalmology;  Laterality: Left;  Endolaser  . Left ureteral stent placement  Week of 12/05/11  . PARS PLANA VITRECTOMY Left 01/09/2016   Procedure: PARS PLANA VITRECTOMY WITH 25 GAUGE LEFT EYE ;  Surgeon: Jalene Mullet, MD;  Location: [place];  Service: Ophthalmology;  Laterality: Left;  . PERFLUORONE INJECTION Left 01/09/2016   Procedure: PERFLUORONE INJECTION;  Surgeon: Jalene Mullet, MD;  Location: [place];  Service: Ophthalmology;  Laterality: Left;  . PROSTATECTOMY  1996   Dr. Rosana Hoes  . URETERAL STENT PLACEMENT  05/2020   [facility] every 3 months   . URETERAL STENT PLACEMENT  09/23/2020   Patient gets replaced every 3 months    Family History  Problem Relation Age of Onset  . Heart disease Father   . Stroke Sister    Social History   Socioeconomic History  . Marital status: Married    Spouse name: Not on file  . Number of children: Not on file  . Years of education: Not on file  . Highest education level: Not on file  Occupational History  . Not on file  Tobacco Use  . Smoking status: Former Smoker    Years: 5.00    Types: Cigars    Quit date: 12/16/1968    Years since quitting: 51.8  . Smokeless tobacco: Never Used  Vaping Use  . Vaping Use: Never used  Substance and Sexual Activity  . Alcohol use: No    Alcohol/week: 0.0 standard drinks  . Drug use: No  . Sexual activity: Never  Other Topics Concern  . Not on file  Social History Narrative  . Not on file   Social Determinants of Health   Financial Resource Strain:   . Difficulty of Paying Living Expenses: Not on file  Food Insecurity:   . Worried About  Charity fundraiser in the Last Year: Not on file  . Ran Out of Food in the Last Year: Not on file  Transportation Needs:   . Lack of Transportation (Medical): Not on file  . Lack of Transportation (Non-Medical): Not on file  Physical Activity:   . Days of Exercise per Week: Not on file  . Minutes of Exercise per Session: Not on file  Stress:   . Feeling of Stress : Not on file  Social Connections:   . Frequency of Communication with Friends and Family: Not on file  . Frequency of Social Gatherings with Friends and Family: Not on file  . Attends Religious Services: Not on file  . Active Member of Clubs or Organizations: Not on file  . Attends Club  or Organization Meetings: Not on file  . Marital Status: Not on file    Tobacco Counseling Counseling given: Not Answered   Clinical Intake:  Pre-visit preparation completed: Yes  Pain : No/denies pain     BMI - recorded: 37 Diabetes: Yes  How often do you need to have someone help you when you read instructions, pamphlets, or other written materials from your doctor or pharmacy?: 1 - Never  Diabetic?no         Activities of Daily Living In your present state of health, do you have any difficulty performing the following activities: 09/24/2020  Hearing? Y  Vision? N  Difficulty concentrating or making decisions? N  Walking or climbing stairs? Y  Comment trouble climbing stairs  Dressing or bathing? N  Doing errands, shopping? N  Preparing Food and eating ? N  Using the Toilet? N  In the past six months, have you accidently leaked urine? Y  Do you have problems with loss of bowel control? N  Managing your Medications? N  Managing your Finances? N  Housekeeping or managing your Housekeeping? N  Some recent data might be hidden    Patient Care Team: Lauree Chandler, NP as PCP - General (Geriatric Medicine) Jerline Pain, MD as PCP - Cardiology (Cardiology) Myrlene Broker, MD as Attending Physician  (Urology) [name], MD as Consulting Physician (Ophthalmology) [name], MD as Consulting Physician (Ophthalmology)  Indicate any recent Medical Services you may have received from other than [facility] providers in the past year (date may be approximate).     Assessment:   This is a routine wellness examination for Walter.  Hearing/Vision screen  Hearing Screening   125Hz  250Hz  500Hz  1000Hz  2000Hz  3000Hz  4000Hz  6000Hz  8000Hz   Right ear:           Left ear:           Comments: Decreased hearing, does not wear hearing aids.   Vision Screening Comments: Last eye exam less than 12 months ago   Dietary issues and exercise activities discussed: Current Exercise Habits: The patient does not participate in regular exercise at present  Goals    . Increase water intake     Starting 09/03/16, I will attempt to increase my water intake by 2 cups.     Nilda Simmer LIfestyle     Starting today pt will maintain lifestyle.       Depression Screen PHQ 2/9 Scores 09/24/2020 07/30/2020 09/24/2019 09/24/2019 06/22/2019 03/16/2019 09/15/2018  PHQ - 2 Score 0 0 0 0 0 0 0    Fall Risk Fall Risk  09/24/2020 07/30/2020 01/28/2020 09/24/2019 09/24/2019  Falls in the past year? 0 0 0 0 0  Number falls in past yr: 0 0 0 0 0  Injury with Fall? 0 0 0 0 0    Any stairs in or around the home? Yes  If so, are there any without handrails? No  Home free of loose throw rugs in walkways, pet beds, electrical cords, etc? Yes  Adequate lighting in your home to reduce risk of falls? Yes   ASSISTIVE DEVICES UTILIZED TO PREVENT FALLS:  Life alert? No  Use of a cane, walker or w/c? No  Grab bars in the bathroom? Yes  Shower chair or bench in shower? No  Elevated toilet seat or a handicapped toilet? No   TIMED UP AND GO:  Was the test performed? No .    Cognitive Function: MMSE - Mini Mental State  Exam 09/24/2019 09/05/2017 09/03/2016 09/10/2014  Orientation to time 4 5 5 5   Orientation to Place 5 5 5 5     Registration 3 3 3 3   Attention/ Calculation 3 4 5 5   Recall 2 1 3 3   Language- name 2 objects 2 2 2 2   Language- repeat 1 1 1 1   Language- follow 3 step command 2 3 3 3   Language- read & follow direction 1 1 1 1   Write a sentence 1 1 0 1  Copy design 1 1 1 1   Total score 25 27 29 30      6CIT Screen 09/24/2020  What Year? 0 points  What month? 0 points  What time? 0 points  Count back from 20 0 points  Months in reverse 0 points  Repeat phrase 6 points  Total Score 6    Immunizations Immunization History  Administered Date(s) Administered  . Fluad Quad(high Dose 65+) 07/30/2020  . Influenza Whole 08/13/2010, 08/16/2011  . Influenza, High Dose Seasonal PF 08/19/2016, 09/12/2018, 09/04/2019  . Influenza, Quadrivalent, Recombinant, Inj, Pf 07/18/2013, 08/20/2015  . Influenza,inj,Quad PF,6+ Mos 07/18/2013, 08/20/2015, 09/05/2017  . Influenza-Unspecified 09/01/2016  . PFIZER SARS-COV-2 Vaccination 12/05/2019, 12/23/2019  . Pneumococcal Conjugate-13 01/14/2015  . Pneumococcal Polysaccharide-23 09/06/2011  . Tdap 11/07/2013    TDAP status: Up to date Flu Vaccine status: Up to date Pneumococcal vaccine status: Up to date Covid-19 vaccine status: Completed vaccines  Qualifies for Shingles Vaccine? Yes   Zostavax completed No   Shingrix Completed?: No.    Education has been provided regarding the importance of this vaccine. Patient has been advised to call insurance company to determine out of pocket expense if they have not yet received this vaccine. Advised may also receive vaccine at local pharmacy or Health Dept. Verbalized acceptance and understanding.  Screening Tests Health Maintenance  Topic Date Due  . OPHTHALMOLOGY EXAM  11/14/2020  . HEMOGLOBIN A1C  01/27/2021  . FOOT EXAM  07/30/2021  . TETANUS/TDAP  11/08/2023  . INFLUENZA VACCINE  Completed  . COVID-19 Vaccine  Completed  . PNA vac Low Risk Adult  Completed    Health Maintenance  There are no  preventive care reminders to display for this patient.  Colorectal cancer screening: No longer required.   Lung Cancer Screening: (Low Dose CT Chest recommended if Age 52-80 years, 30 pack-year currently smoking OR have quit w/in 15years.) does not qualify.   Lung Cancer Screening Referral: na  Additional Screening:  Hepatitis C Screening: does not qualify; Completed na  Vision Screening: Recommended annual ophthalmology exams for early detection of glaucoma and other disorders of the eye. Is the patient up to date with their annual eye exam?  Yes  Who is the provider or what is the name of the office in which the patient attends annual eye exams? Dr Maricela Curet If pt is not established with a provider, would they like to be referred to a provider to establish care? No .   Dental Screening: Recommended annual dental exams for proper oral hygiene  Community Resource Referral / Chronic Care Management: CRR required this visit?  No   CCM required this visit?  No      Plan:     I have personally reviewed and noted the following in the patient's chart:   . Medical and social history . Use of alcohol, tobacco or illicit drugs  . Current medications and supplements . Functional ability and status . Nutritional status . Physical activity . Advanced directives .  List of other physicians . Hospitalizations, surgeries, and ER visits in previous 12 months . Vitals . Screenings to include cognitive, depression, and falls . Referrals and appointments  In addition, I have reviewed and discussed with patient certain preventive protocols, quality metrics, and best practice recommendations. A written personalized care plan for preventive services as well as general preventive health recommendations were provided to patient.     Lauree Chandler, NP   09/24/2020    Virtual Visit via Telephone Note  I connected with pt on 09/24/20 at  8:30 AM EST by telephone and verified that I am  speaking with the correct person using two identifiers.  Location: Patient: home Provider: [place]   I discussed the limitations, risks, security and privacy concerns of performing an evaluation and management service by telephone and the availability of in person appointments. I also discussed with the patient that there may be a patient responsible charge related to this service. The patient expressed understanding and agreed to proceed.   I discussed the assessment and treatment plan with the patient. The patient was provided an opportunity to ask questions and all were answered. The patient agreed with the plan and demonstrated an understanding of the instructions.   The patient was advised to call back or seek an in-person evaluation if the symptoms worsen or if the condition fails to improve as anticipated.  I provided 15 minutes of non-face-to-face time during this encounter.  Carlos American. Harle Battiest Avs printed and mailed

## 2020-09-24 NOTE — Telephone Encounter (Signed)
[name], bollen are scheduled for a virtual visit with your provider today.    Just as we do with appointments in the office, we must obtain your consent to participate.  Your consent will be active for this visit and any virtual visit you may have with one of our providers in the next 365 days.    If you have a MyChart account, I can also send a copy of this consent to you electronically.  All virtual visits are billed to your insurance company just like a traditional visit in the office.  As this is a virtual visit, video technology does not allow for your provider to perform a traditional examination.  This may limit your provider's ability to fully assess your condition.  If your provider identifies any concerns that need to be evaluated in person or the need to arrange testing such as labs, EKG, etc, we will make arrangements to do so.    Although advances in technology are sophisticated, we cannot ensure that it will always work on either your end or our end.  If the connection with a video visit is poor, we may have to switch to a telephone visit.  With either a video or telephone visit, we are not always able to ensure that we have a secure connection.   I need to obtain your verbal consent now.   Are you willing to proceed with your visit today?   Jeremiah Collins has provided verbal consent on 09/24/2020 for a virtual visit (video or telephone).   [name] [place], [place] 09/24/2020  8:15 am

## 2020-10-04 ENCOUNTER — Other Ambulatory Visit: Payer: Self-pay | Admitting: Cardiology

## 2020-10-09 ENCOUNTER — Other Ambulatory Visit: Payer: Self-pay | Admitting: Nurse Practitioner

## 2020-10-09 DIAGNOSIS — M109 Gout, unspecified: Secondary | ICD-10-CM

## 2020-11-18 ENCOUNTER — Encounter: Payer: Self-pay | Admitting: *Deleted

## 2020-11-18 LAB — HM DIABETES EYE EXAM

## 2020-11-19 ENCOUNTER — Other Ambulatory Visit: Payer: Self-pay

## 2020-11-19 ENCOUNTER — Encounter: Payer: Self-pay | Admitting: Podiatry

## 2020-11-19 ENCOUNTER — Ambulatory Visit (INDEPENDENT_AMBULATORY_CARE_PROVIDER_SITE_OTHER): Payer: Medicare Other | Admitting: Podiatry

## 2020-11-19 DIAGNOSIS — E0843 Diabetes mellitus due to underlying condition with diabetic autonomic (poly)neuropathy: Secondary | ICD-10-CM | POA: Diagnosis not present

## 2020-11-19 DIAGNOSIS — M79675 Pain in left toe(s): Secondary | ICD-10-CM | POA: Diagnosis not present

## 2020-11-19 DIAGNOSIS — M79674 Pain in right toe(s): Secondary | ICD-10-CM

## 2020-11-19 DIAGNOSIS — B351 Tinea unguium: Secondary | ICD-10-CM | POA: Diagnosis not present

## 2020-11-19 NOTE — Progress Notes (Signed)
This patient returns to my office for at risk foot care.  This patient requires this care by a professional since this patient will be at risk due to having This patient is unable to cut nails himself since the patient cannot reach his nails.These nails are painful walking and wearing shoes.  This patient presents for at risk foot care today.  General Appearance  Alert, conversant and in no acute stress.  Vascular  Dorsalis pedis and posterior tibial  pulses are palpable  bilaterally.  Capillary return is within normal limits  bilaterally. Temperature is within normal limits  bilaterally.  Neurologic  Senn-Weinstein monofilament wire test within normal limits  bilaterally. Muscle power within normal limits bilaterally.  Nails Thick disfigured discolored nails with subungual debris  from hallux to fifth toes bilaterally. No evidence of bacterial infection or drainage bilaterally.  Orthopedic  No limitations of motion  feet .  No crepitus or effusions noted.  No bony pathology or digital deformities noted.  Skin  normotropic skin with no porokeratosis noted bilaterally.  No signs of infections or ulcers noted.     Onychomycosis  Pain in right toes  Pain in left toes  Consent was obtained for treatment procedures.   Mechanical debridement of nails 1-5  bilaterally performed with a nail nipper.  Filed with dremel without incident.    Return office visit                     Told patient to return for periodic foot care and evaluation due to potential at risk complications.   Laurren Lepkowski DPM   

## 2020-11-22 ENCOUNTER — Other Ambulatory Visit: Payer: Self-pay | Admitting: Nurse Practitioner

## 2020-11-26 ENCOUNTER — Other Ambulatory Visit: Payer: Self-pay | Admitting: Nurse Practitioner

## 2020-11-26 DIAGNOSIS — I10 Essential (primary) hypertension: Secondary | ICD-10-CM

## 2020-12-13 ENCOUNTER — Other Ambulatory Visit: Payer: Self-pay | Admitting: Nurse Practitioner

## 2020-12-13 DIAGNOSIS — I10 Essential (primary) hypertension: Secondary | ICD-10-CM

## 2020-12-13 DIAGNOSIS — I517 Cardiomegaly: Secondary | ICD-10-CM

## 2021-01-28 ENCOUNTER — Encounter: Payer: Self-pay | Admitting: Nurse Practitioner

## 2021-01-28 ENCOUNTER — Ambulatory Visit: Payer: Medicare Other | Admitting: Nurse Practitioner

## 2021-01-28 ENCOUNTER — Other Ambulatory Visit: Payer: Self-pay

## 2021-01-28 VITALS — BP 128/74 | HR 61 | Temp 96.8°F | Ht 71.0 in | Wt 268.0 lb

## 2021-01-28 DIAGNOSIS — C61 Malignant neoplasm of prostate: Secondary | ICD-10-CM

## 2021-01-28 DIAGNOSIS — I10 Essential (primary) hypertension: Secondary | ICD-10-CM

## 2021-01-28 DIAGNOSIS — M1A9XX0 Chronic gout, unspecified, without tophus (tophi): Secondary | ICD-10-CM

## 2021-01-28 DIAGNOSIS — C7952 Secondary malignant neoplasm of bone marrow: Secondary | ICD-10-CM

## 2021-01-28 DIAGNOSIS — M159 Polyosteoarthritis, unspecified: Secondary | ICD-10-CM

## 2021-01-28 DIAGNOSIS — Z6837 Body mass index (BMI) 37.0-37.9, adult: Secondary | ICD-10-CM

## 2021-01-28 DIAGNOSIS — E1142 Type 2 diabetes mellitus with diabetic polyneuropathy: Secondary | ICD-10-CM | POA: Diagnosis not present

## 2021-01-28 DIAGNOSIS — M8949 Other hypertrophic osteoarthropathy, multiple sites: Secondary | ICD-10-CM | POA: Diagnosis not present

## 2021-01-28 DIAGNOSIS — J439 Emphysema, unspecified: Secondary | ICD-10-CM

## 2021-01-28 DIAGNOSIS — E782 Mixed hyperlipidemia: Secondary | ICD-10-CM

## 2021-01-28 DIAGNOSIS — C7951 Secondary malignant neoplasm of bone: Secondary | ICD-10-CM

## 2021-01-28 DIAGNOSIS — I700 Atherosclerosis of aorta: Secondary | ICD-10-CM

## 2021-01-28 DIAGNOSIS — I420 Dilated cardiomyopathy: Secondary | ICD-10-CM

## 2021-01-28 NOTE — Progress Notes (Signed)
Careteam: Patient Care Team: Lauree Chandler, NP as PCP - General (Geriatric Medicine) Jerline Pain, MD as PCP - Cardiology (Cardiology) Myrlene Broker, MD as Attending Physician (Urology) [name], MD as Consulting Physician (Ophthalmology) [name], MD as Consulting Physician (Ophthalmology)  PLACE OF SERVICE:  [place]  Advanced Directive information    Allergies  Allergen Reactions  . Abiraterone Nausea And Vomiting and Other (See Comments)    Other reaction(s): Increased Heart Rate (intolerance)     Chief Complaint  Patient presents with  . Medical Management of Chronic Issues    6 month follow-up. Discuss need for covid booster and a1c      HPI: Patient is a 87 y.o. male for routine follow up.   Prostate cancer with uretal obstruction with stent exchanged every 3 months. Recent change in medication due to PSA being stable. Followed by oncology and urology.  No changes in activity level or energy levels.  Reports low energy but gets out every day "do not lay around" Continues on cardura  Gout- stable on allopurinol, occasionally some pain in his thumb  DM- A1c 6.0. not on medication. Stays active.  Has numbness and tingling in LE, this is unchanged.   htn- well controlled   Aortic stenosis/mitral annular calcification- monitored by cardiology   Anemia- on iron supplement   Review of Systems:  Review of Systems  Constitutional: Negative for chills, fever and weight loss.  HENT: Negative for tinnitus.   Respiratory: Positive for shortness of breath. Negative for cough and sputum production.        Shortness of breath with exertion- at baseline  Cardiovascular: Negative for chest pain, palpitations and leg swelling.  Gastrointestinal: Negative for abdominal pain, constipation, diarrhea and heartburn.  Genitourinary: Positive for frequency. Negative for dysuria and urgency.       Leaks urine  Musculoskeletal: Negative for back  pain, falls, joint pain and myalgias.  Skin: Negative.   Neurological: Negative for dizziness and headaches.  Psychiatric/Behavioral: Negative for depression and memory loss. The patient does not have insomnia.     Past Medical History:  Diagnosis Date  . Acute bronchitis 09/12/2012  . 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 Bone metastases ([place])    left mid tibial shaft  . Cataract    Dr.Groat  . Cellulitis and abscess of leg, except foot 01/17/2012  . Chest pain, unspecified 04/28/2004  . Closed fracture of five ribs 03/24/2004  . First degree atrioventricular block 04/25/2007  . Gross hematuria 09/06/2011  . History of radiation therapy 04/03/14- 04/17/14   mid to distal left tibia 3000 cGy in 10 sessions  . HTN (hypertension)   . Hx of radiation therapy 11/1998   prostate fossa - 6040 cGy, 33 fx, Dr Danny Lawless  . Osteoarthrosis involving, or with mention of more than one site, but not specified as generalized, multiple sites 10/22/2010  . Other abnormal blood chemistry 04/29/1991  . Other and unspecified hyperlipidemia 01/02/2013  . Palpitations 04/28/2004  . Prostate cancer ([place]) 12/16/1994   gleason 7  . Reflux esophagitis 05/10/2008  . Routine general medical examination at a health care facility   . Spinal stenosis, unspecified region other than cervical 04/29/1995  . Type II or unspecified type diabetes mellitus without mention of complication, uncontrolled    Past Surgical History:  Procedure Laterality Date  . AIR/FLUID EXCHANGE Left 01/09/2016   Procedure: AIR/FLUID EXCHANGE;  Surgeon: Jalene Mullet, MD;  Location: [place];  Service: Ophthalmology;  Laterality:  Left;  . CYSTOSCOPY W/ URETERAL STENT PLACEMENT  12/31/14  . CYSTOSCOPY W/ URETERAL STENT PLACEMENT  05/16/2015  . EYE SURGERY  2006   cataract, Dr Shanon Rosser  . LASER PHOTO ABLATION Left 01/09/2016   Procedure: LASER PHOTO ABLATION;  Surgeon: Jalene Mullet, MD;  Location: [place];  Service: Ophthalmology;  Laterality: Left;   Endolaser  . Left ureteral stent placement  Week of 12/05/11  . PARS PLANA VITRECTOMY Left 01/09/2016   Procedure: PARS PLANA VITRECTOMY WITH 25 GAUGE LEFT EYE ;  Surgeon: Jalene Mullet, MD;  Location: [place];  Service: Ophthalmology;  Laterality: Left;  . PERFLUORONE INJECTION Left 01/09/2016   Procedure: PERFLUORONE INJECTION;  Surgeon: Jalene Mullet, MD;  Location: [place];  Service: Ophthalmology;  Laterality: Left;  . PROSTATECTOMY  1996   Dr. Rosana Hoes  . URETERAL STENT PLACEMENT  05/2020   [facility] every 3 months   . URETERAL STENT PLACEMENT  09/23/2020   Patient gets replaced every 3 months    Social History:   reports that he quit smoking about 52 years ago. His smoking use included cigars. He quit after 5.00 years of use. He has never used smokeless tobacco. He reports that he does not drink alcohol and does not use drugs.  Family History  Problem Relation Age of Onset  . Heart disease Father   . Stroke Sister     Medications: Patient's Medications  New Prescriptions   No medications on file  Previous Medications   ACETAMINOPHEN (TYLENOL) 650 MG CR TABLET    Take 650 mg by mouth as needed for pain.   ALLOPURINOL (ZYLOPRIM) 100 MG TABLET    TAKE 2 TABLETS BY MOUTH EVERY DAY   ASPIRIN 81 MG TABLET    Take 1 tablet (81 mg total) by mouth daily.   CALCIUM CARBONATE (TUMS - DOSED IN MG ELEMENTAL CALCIUM) 500 MG CHEWABLE TABLET    Chew 1 tablet by mouth as needed for indigestion or heartburn.   CALCIUM CARBONATE-VITAMIN D (CALCIUM 600+D PO)    Take 1 tablet by mouth 2 (two) times daily.   CHOLECALCIFEROL 1000 UNITS TABLET    Take 1,000 Units by mouth daily.   DOXAZOSIN (CARDURA) 4 MG TABLET    TAKE 1 TABLET BY MOUTH EVERY DAY   FERROUS SULFATE 325 (65 FE) MG EC TABLET    Take 325 mg by mouth every other day.   GLUCOSE BLOOD (ONETOUCH VERIO) TEST STRIP    Check blood sugar one to two times a week. Dx:E11.29   LEUPROLIDE, 6 MONTH, (ELIGARD) 45 MG INJECTION    Inject 45 mg into the  skin every 6 (six) months. Filled by [place] (ZESTORETIC) 20-12.5 MG TABLET    TAKE 2 TABLETS BY MOUTH EVERY DAY IN THE MORNING   METOPROLOL TARTRATE (LOPRESSOR) 50 MG TABLET    TAKE 1 TABLET BY MOUTH TWICE A DAY   OMEGA-3 FATTY ACIDS (FISH OIL) 1000 MG CAPS    Take 1 capsule by mouth daily.   PYRIDOXINE (B-6) 100 MG TABLET    Take 100 mg by mouth daily.   ROSUVASTATIN (CRESTOR) 20 MG TABLET    TAKE 1 TABLET (20 MG TOTAL) BY MOUTH DAILY. MUST KEEP UPCOMING APPT FOR FURTHER REFILLS.  Modified Medications   No medications on file  Discontinued Medications   ENZALUTAMIDE (XTANDI) 40 MG CAPSULE    Take 80 mg by mouth daily.     Physical Exam:  Vitals:   01/28/21 3710  BP: 128/74  Pulse: 61  Temp: (!) 96.8 F (36 C)  TempSrc: Temporal  SpO2: 96%  Weight: 268 lb (121.6 kg)  Height: 5\' 11"  (1.803 m)   Body mass index is 37.38 kg/m. Wt Readings from Last 3 Encounters:  01/28/21 268 lb (121.6 kg)  07/30/20 271 lb (122.9 kg)  07/16/20 268 lb 12.8 oz (121.9 kg)    Physical Exam Constitutional:      General: He is not in acute distress.    Appearance: He is well-developed. He is not diaphoretic.  HENT:     Head: Normocephalic and atraumatic.     Mouth/Throat:     Pharynx: No oropharyngeal exudate.  Eyes:     Conjunctiva/sclera: Conjunctivae normal.     Pupils: Pupils are equal, round, and reactive to light.  Cardiovascular:     Rate and Rhythm: Normal rate and regular rhythm.     Heart sounds: Normal heart sounds.  Pulmonary:     Effort: Pulmonary effort is normal.     Breath sounds: Normal breath sounds.  Abdominal:     General: Bowel sounds are normal.     Palpations: Abdomen is soft.  Musculoskeletal:        General: No tenderness.     Cervical back: Normal range of motion and neck supple.  Skin:    General: Skin is warm and dry.  Neurological:     Mental Status: He is alert and oriented to person, place, and time.   Psychiatric:        Mood and Affect: Mood normal.        Behavior: Behavior normal.     Labs reviewed: Basic Metabolic Panel: Recent Labs    07/30/20 0919  NA 139  K 4.3  CL 108  CO2 23  GLUCOSE 124*  BUN 24  CREATININE 1.12*  CALCIUM 8.9   Liver Function Tests: Recent Labs    07/30/20 0919  AST 9*  ALT 7*  BILITOT 0.4  PROT 5.6*   No results for input(s): LIPASE, AMYLASE in the last 8760 hours. No results for input(s): AMMONIA in the last 8760 hours. CBC: Recent Labs    07/30/20 0919  WBC 7.7  NEUTROABS 5,036  HGB 11.0*  HCT 32.9*  MCV 85.5  PLT 154   Lipid Panel: Recent Labs    07/30/20 0919  CHOL 112  HDL 41  LDLCALC 53  TRIG 96  CHOLHDL 2.7   TSH: No results for input(s): TSH in the last 8760 hours. A1C: Lab Results  Component Value Date   HGBA1C 6.0 (H) 07/30/2020     Assessment/Plan 1. DM type 2 with diabetic peripheral neuropathy ([place]) -Encouraged dietary compliance, routine foot care/monitoring and to keep up with diabetic eye exams through ophthalmology  - Hemoglobin A1c  2. Diabetic polyneuropathy associated with type 2 diabetes mellitus (HCC) -stable at this time. No worsening of symptoms.  3. Prostate cancer ([place]) PSA stable, followed by urology and oncology. He was previously on  reduced dose enzalutamide 120 mg per day but PSA was stable/undetecable therefore was D/C'd. Continues on Eligard every 6 months.   4. Chronic gout without tophus, unspecified cause, unspecified site No acute flare, continues on allopurinol.  - Uric Acid  5. Primary osteoarthritis involving multiple joints -ongoing, stable, uses tylenol PRN.  6. Essential hypertension, benign Controlled on lisinopril-hctz and lopressor - CBC with Differential/Platelet - COMPLETE METABOLIC PANEL WITH GFR  7. Mixed hyperlipidemia Continues on crestor 20 mg daily. LDL at goal  in September.  - COMPLETE METABOLIC PANEL WITH GFR  8. Secondary malignant neoplasm  of bone and bone marrow (HCC) Stable without progressive symptoms. Continues to be monitored by oncology   9. Pulmonary emphysema, unspecified emphysema type ([place]) Stable, noted on imaging. without worsening shortness of breath, wheezing, cough or congestion.   10. Congestive dilated cardiomyopathy ([place]) Stable followed by cardiology, no weight gain, wrseni  11. Atherosclerosis of aorta ([place]) Continues on statin and ASA  12. Class 2 severe obesity due to excess calories with serious comorbidity and body mass index (BMI) of 37.0 to 37.9 in adult [facility]) Pt educated on proper diet with physical activity (as tolerated) for healthy weight loss.  23. Osteoporosis Receiving reclast yearly through oncologist  Next appt: 6 months.  Carlos American. [place], [facility] [phone]

## 2021-01-29 LAB — COMPLETE METABOLIC PANEL WITH GFR
AG Ratio: 1.7 (calc) (ref 1.0–2.5)
ALT: 8 U/L — ABNORMAL LOW (ref 9–46)
AST: 12 U/L (ref 10–35)
Albumin: 3.8 g/dL (ref 3.6–5.1)
Alkaline phosphatase (APISO): 82 U/L (ref 35–144)
BUN/Creatinine Ratio: 23 (calc) — ABNORMAL HIGH (ref 6–22)
BUN: 36 mg/dL — ABNORMAL HIGH (ref 7–25)
CO2: 24 mmol/L (ref 20–32)
Calcium: 9.3 mg/dL (ref 8.6–10.3)
Chloride: 107 mmol/L (ref 98–110)
Creat: 1.54 mg/dL — ABNORMAL HIGH (ref 0.70–1.11)
GFR, Est African American: 47 mL/min/{1.73_m2} — ABNORMAL LOW (ref >=60)
GFR, Est Non African American: 41 mL/min/{1.73_m2} — ABNORMAL LOW (ref >=60)
Globulin: 2.2 g/dL (calc) (ref 1.9–3.7)
Glucose, Bld: 126 mg/dL — ABNORMAL HIGH (ref 65–99)
Potassium: 4.4 mmol/L (ref 3.5–5.3)
Sodium: 139 mmol/L (ref 135–146)
Total Bilirubin: 0.4 mg/dL (ref 0.2–1.2)
Total Protein: 6 g/dL — ABNORMAL LOW (ref 6.1–8.1)

## 2021-01-29 LAB — CBC WITH DIFFERENTIAL/PLATELET
Absolute Monocytes: 432 cells/uL (ref 200–950)
Basophils Absolute: 48 cells/uL (ref 0–200)
Basophils Relative: 0.6 %
Eosinophils Absolute: 144 cells/uL (ref 15–500)
Eosinophils Relative: 1.8 %
HCT: 34.6 % — ABNORMAL LOW (ref 38.5–50.0)
Hemoglobin: 11.5 g/dL — ABNORMAL LOW (ref 13.2–17.1)
Lymphs Abs: 2064 cells/uL (ref 850–3900)
MCH: 28.5 pg (ref 27.0–33.0)
MCHC: 33.2 g/dL (ref 32.0–36.0)
MCV: 85.9 fL (ref 80.0–100.0)
MPV: 9.4 fL (ref 7.5–12.5)
Monocytes Relative: 5.4 %
Neutro Abs: 5312 cells/uL (ref 1500–7800)
Neutrophils Relative %: 66.4 %
Platelets: 194 10*3/uL (ref 140–400)
RBC: 4.03 10*6/uL — ABNORMAL LOW (ref 4.20–5.80)
RDW: 14.5 % (ref 11.0–15.0)
Total Lymphocyte: 25.8 %
WBC: 8 10*3/uL (ref 3.8–10.8)

## 2021-01-29 LAB — HEMOGLOBIN A1C
Hgb A1c MFr Bld: 5.9 % of total Hgb — ABNORMAL HIGH (ref <5.7)
Mean Plasma Glucose: 123 mg/dL
eAG (mmol/L): 6.8 mmol/L

## 2021-01-29 LAB — URIC ACID: Uric Acid, Serum: 6.4 mg/dL (ref 4.0–8.0)

## 2021-02-18 ENCOUNTER — Encounter: Payer: Self-pay | Admitting: Podiatry

## 2021-02-18 ENCOUNTER — Ambulatory Visit (INDEPENDENT_AMBULATORY_CARE_PROVIDER_SITE_OTHER): Payer: Medicare Other | Admitting: Podiatry

## 2021-02-18 ENCOUNTER — Other Ambulatory Visit: Payer: Self-pay

## 2021-02-18 DIAGNOSIS — M79674 Pain in right toe(s): Secondary | ICD-10-CM | POA: Diagnosis not present

## 2021-02-18 DIAGNOSIS — B351 Tinea unguium: Secondary | ICD-10-CM | POA: Diagnosis not present

## 2021-02-18 DIAGNOSIS — M79675 Pain in left toe(s): Secondary | ICD-10-CM

## 2021-02-18 DIAGNOSIS — E0843 Diabetes mellitus due to underlying condition with diabetic autonomic (poly)neuropathy: Secondary | ICD-10-CM

## 2021-02-18 NOTE — Progress Notes (Signed)
This patient returns to my office for at risk foot care.  This patient requires this care by a professional since this patient will be at risk due to having This patient is unable to cut nails himself since the patient cannot reach his nails.These nails are painful walking and wearing shoes.  This patient presents for at risk foot care today.  General Appearance  Alert, conversant and in no acute stress.  Vascular  Dorsalis pedis and posterior tibial  pulses are palpable  bilaterally.  Capillary return is within normal limits  bilaterally. Temperature is within normal limits  bilaterally.  Neurologic  Senn-Weinstein monofilament wire test within normal limits  bilaterally. Muscle power within normal limits bilaterally.  Nails Thick disfigured discolored nails with subungual debris  from hallux to fifth toes bilaterally. No evidence of bacterial infection or drainage bilaterally.  Orthopedic  No limitations of motion  feet .  No crepitus or effusions noted.  No bony pathology or digital deformities noted.  Skin  normotropic skin with no porokeratosis noted bilaterally.  No signs of infections or ulcers noted.     Onychomycosis  Pain in right toes  Pain in left toes  Consent was obtained for treatment procedures.   Mechanical debridement of nails 1-5  bilaterally performed with a nail nipper.  Filed with dremel without incident.    Return office visit     3 months                Told patient to return for periodic foot care and evaluation due to potential at risk complications.   Gardiner Barefoot DPM

## 2021-04-04 ENCOUNTER — Other Ambulatory Visit: Payer: Self-pay | Admitting: Nurse Practitioner

## 2021-04-04 DIAGNOSIS — M109 Gout, unspecified: Secondary | ICD-10-CM

## 2021-05-19 ENCOUNTER — Other Ambulatory Visit: Payer: Self-pay | Admitting: Nurse Practitioner

## 2021-05-19 DIAGNOSIS — I517 Cardiomegaly: Secondary | ICD-10-CM

## 2021-05-19 DIAGNOSIS — I10 Essential (primary) hypertension: Secondary | ICD-10-CM

## 2021-05-20 ENCOUNTER — Other Ambulatory Visit: Payer: Self-pay | Admitting: Nurse Practitioner

## 2021-05-20 DIAGNOSIS — I10 Essential (primary) hypertension: Secondary | ICD-10-CM

## 2021-05-22 ENCOUNTER — Other Ambulatory Visit: Payer: Self-pay

## 2021-05-22 ENCOUNTER — Ambulatory Visit: Payer: Medicare Other | Admitting: Podiatry

## 2021-05-22 ENCOUNTER — Encounter: Payer: Self-pay | Admitting: Podiatry

## 2021-05-22 DIAGNOSIS — B351 Tinea unguium: Secondary | ICD-10-CM

## 2021-05-22 DIAGNOSIS — E0843 Diabetes mellitus due to underlying condition with diabetic autonomic (poly)neuropathy: Secondary | ICD-10-CM

## 2021-05-22 DIAGNOSIS — M79675 Pain in left toe(s): Secondary | ICD-10-CM

## 2021-05-22 DIAGNOSIS — M79674 Pain in right toe(s): Secondary | ICD-10-CM | POA: Diagnosis not present

## 2021-05-22 NOTE — Progress Notes (Signed)
This patient returns to my office for at risk foot care.  This patient requires this care by a professional since this patient will be at risk due to having diabetes with neuropathy.  This patient is unable to cut nails himself since the patient cannot reach his nails.These nails are painful walking and wearing shoes.  This patient presents for at risk foot care today.  General Appearance  Alert, conversant and in no acute stress.  Vascular  Dorsalis pedis and posterior tibial  pulses are palpable  bilaterally.  Capillary return is within normal limits  bilaterally. Temperature is within normal limits  bilaterally.  Neurologic  Senn-Weinstein monofilament wire test within normal limits  bilaterally. Muscle power within normal limits bilaterally.  Nails Thick disfigured discolored nails with subungual debris  from hallux to fifth toes bilaterally. No evidence of bacterial infection or drainage bilaterally.  Orthopedic  No limitations of motion  feet .  No crepitus or effusions noted.  No bony pathology or digital deformities noted.  Skin  normotropic skin with no porokeratosis noted bilaterally.  No signs of infections or ulcers noted.     Onychomycosis  Pain in right toes  Pain in left toes  Consent was obtained for treatment procedures.   Mechanical debridement of nails 1-5  bilaterally performed with a nail nipper.  Filed with dremel without incident.    Return office visit     3 months                Told patient to return for periodic foot care and evaluation due to potential at risk complications.   Macala Baldonado DPM  

## 2021-06-07 ENCOUNTER — Encounter (HOSPITAL_COMMUNITY): Payer: Self-pay | Admitting: Emergency Medicine

## 2021-06-07 ENCOUNTER — Emergency Department (HOSPITAL_COMMUNITY)
Admission: EM | Admit: 2021-06-07 | Discharge: 2021-06-07 | Disposition: A | Discharge status: Home / Self Care | Payer: Medicare Other | Attending: Emergency Medicine | Admitting: Emergency Medicine

## 2021-06-07 ENCOUNTER — Emergency Department (HOSPITAL_COMMUNITY): Payer: Medicare Other

## 2021-06-07 DIAGNOSIS — M2391 Unspecified internal derangement of right knee: Secondary | ICD-10-CM | POA: Diagnosis not present

## 2021-06-07 DIAGNOSIS — Z8546 Personal history of malignant neoplasm of prostate: Secondary | ICD-10-CM | POA: Diagnosis not present

## 2021-06-07 DIAGNOSIS — E1140 Type 2 diabetes mellitus with diabetic neuropathy, unspecified: Secondary | ICD-10-CM | POA: Insufficient documentation

## 2021-06-07 DIAGNOSIS — Z85830 Personal history of malignant neoplasm of bone: Secondary | ICD-10-CM | POA: Diagnosis not present

## 2021-06-07 DIAGNOSIS — Z87891 Personal history of nicotine dependence: Secondary | ICD-10-CM | POA: Diagnosis not present

## 2021-06-07 DIAGNOSIS — M25461 Effusion, right knee: Secondary | ICD-10-CM | POA: Insufficient documentation

## 2021-06-07 DIAGNOSIS — Z85828 Personal history of other malignant neoplasm of skin: Secondary | ICD-10-CM | POA: Insufficient documentation

## 2021-06-07 DIAGNOSIS — Z79899 Other long term (current) drug therapy: Secondary | ICD-10-CM | POA: Diagnosis not present

## 2021-06-07 DIAGNOSIS — Z7982 Long term (current) use of aspirin: Secondary | ICD-10-CM | POA: Insufficient documentation

## 2021-06-07 DIAGNOSIS — M25561 Pain in right knee: Secondary | ICD-10-CM | POA: Diagnosis present

## 2021-06-07 DIAGNOSIS — I10 Essential (primary) hypertension: Secondary | ICD-10-CM | POA: Insufficient documentation

## 2021-06-07 MED ORDER — HYDROCODONE-ACETAMINOPHEN 5-325 MG PO TABS: 1.0000 | ORAL_TABLET | Freq: Four times a day (QID) | ORAL | 0 refills | Status: DC | PRN

## 2021-06-07 MED ORDER — DOCUSATE SODIUM 100 MG PO CAPS: 100.0000 mg | ORAL_CAPSULE | Freq: Two times a day (BID) | ORAL | 0 refills | Status: DC

## 2021-06-07 NOTE — ED Provider Notes (Signed)
Emergency Medicine Provider Triage Evaluation Note  Jeremiah Collins , a 87 y.o. male  was evaluated in triage.  Pt complains of right knee pain and swelling.  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  He heard a pop and had acute onset pain.  Is been swollen and painful since.  No blood thinners.  Review of Systems  Positive: R knee pain Negative: numbness  Physical Exam  BP 109/76   Pulse 65   Temp 98.7 F (37.1 C) (Oral)   Resp 14   SpO2 96%  Gen:   Awake, no distress   Resp:  Normal effort  MSK:   Right knee swollen compared to left.  Tender along the medial joint line.  Patient able to hold his leg straight with discomfort.  Pain with both active and passive range of motion. No erythema or warmth of the knee   Medical Decision Making  Medically screening exam initiated at 9:46 AM.  Appropriate orders placed.  Jeremiah Collins was informed that the remainder of the evaluation will be completed by another provider, this initial triage assessment does not replace that evaluation, and the importance of remaining in the ED until their evaluation is complete.  xray   Franchot Heidelberg, PA-C 06/07/21 [id number]    Lennice Sites, DO 06/07/21 1233

## 2021-06-07 NOTE — Discharge Instructions (Addendum)
1.  Elevate your leg is much as possible over the next 2 to 3 days.  Apply a well wrapped ice pack around the knee for 20 minutes every 2 hours is much as possible.  Pay attention to make sure that there is no injury occurring to the skin.  When you are up and walking, apply an Ace wrap and knee immobilizer.  Use a walker to weight-bear as little as possible on the knee. 2.  You may take extra strength Tylenol every 6 hours for pain control.  If you need a stronger pain medication, you may take the Vicodin as prescribed 1 to 2 tablets every 6 hours.  Because Vicodin can cause constipation, take Colace twice a day if you are using the Vicodin. 3.  Schedule follow-up appointment at [facility] as soon as possible for recheck. 4.  Return to the emergency department if you are having weakness or numbness of the leg, redness or severely increasing pain or other concerning symptoms.

## 2021-06-07 NOTE — ED Triage Notes (Signed)
C/o R knee pain and swelling since pushing knee on a trailer hitch that he was trying to hook up on Friday.

## 2021-06-07 NOTE — ED Provider Notes (Signed)
[facility] EMERGENCY DEPARTMENT Provider Note   CSN: [id number] Arrival date & time: 06/07/21  0920     History No chief complaint on file.   Jeremiah Collins is a 87 y.o. male.  HPI Patient reports 2 days ago he was using his right knee to try to push a a trailer hitch over onto his truck.  He reports he got a really sudden shocking severe pain in the knee.  He reports since that time it is started to swell.  It was very painful to bend it and put weight on it.  Patient reports he already has neuropathy of his foot.  No new weakness or numbness.  He has not been doing any elevating or icing.  He has been trying Tylenol for pain but is still very painful when he tries to walk.  No other associated injury    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/28/2006   Bone metastases ([place])    left mid tibial shaft   Cataract    Dr.Groat   Cellulitis and abscess of leg, except foot 01/17/2012   Chest pain, unspecified 04/28/2004   Closed fracture of five ribs 03/24/2004   First degree atrioventricular block 04/25/2007   Gross hematuria 09/06/2011   History of radiation therapy 04/03/14- 04/17/14   mid to distal left tibia 3000 cGy in 10 sessions   HTN (hypertension)    Hx of radiation therapy 11/1998   prostate fossa - 6040 cGy, 33 fx, Dr Danny Lawless   Osteoarthrosis involving, or with mention of more than one site, but not specified as generalized, multiple sites 10/22/2010   Other abnormal blood chemistry 04/29/1991   Other and unspecified hyperlipidemia 01/02/2013   Palpitations 04/28/2004   Prostate cancer ([place]) 12/16/1994   gleason 7   Reflux esophagitis 05/10/2008   Routine general medical examination at a health care facility    Spinal stenosis, unspecified region other than cervical 04/29/1995   Type II or unspecified type diabetes mellitus without mention of complication, uncontrolled     Patient Active Problem  List   Diagnosis Date Noted   Atherosclerosis of aorta ([place]) 01/28/2020   Pain due to onychomycosis of toenails of both feet 06/27/2019   Diabetic neuropathy ([place]) 06/27/2019   Class 2 severe obesity due to excess calories with serious comorbidity and body mass index (BMI) of 37.0 to 37.9 in adult ([place]) 11/11/2017   Hollenhorst plaque 02/10/2016   Congestive dilated cardiomyopathy ([place]) 02/10/2016   Bilateral edema of lower extremity 01/14/2015   Diabetes mellitus with renal manifestations, controlled ([place]) 09/10/2014   DM type 2, uncontrolled, with renal complications ([place]) 0000000   DM type 2 with diabetic peripheral neuropathy ([place]) 05/29/2014   Bone pain 05/29/2014   Secondary malignant neoplasm of bone and bone marrow ([place]) 03/28/2014   Malignant neoplasm of lower limb ([place]) 03/27/2014   Pain of left leg 03/12/2014   Left leg swelling 03/04/2014   Malignant neoplasm of prostate ([place]) 03/04/2014   Emphysema lung ([place]) 02/05/2014   Dizziness 01/23/2014   Dyspnea 01/23/2014   Orthostatic hypotension 12/18/2013   Diabetes mellitus type 2 in obese ([place]) 11/06/2013   Bradycardia 11/06/2013   Vitamin D deficiency 07/18/2013   Uncontrolled hypertension 07/18/2013   Hyperlipidemia 07/03/2013   Essential hypertension, benign 07/03/2013   Prostate cancer ([place]) 07/03/2013   Osteoarthritis 07/03/2013   Skin lesion 07/03/2013   Prediabetes 07/03/2013   Disorder of bone and cartilage  07/03/2013   Hypokalemia 12/11/2012   Hyponatremia 12/11/2012   UTI (lower urinary tract infection) 12/10/2012   Sepsis ([place]) 12/10/2012   Pyelonephritis 12/10/2012    Past Surgical History:  Procedure Laterality Date   AIR/FLUID EXCHANGE Left 01/09/2016   Procedure: AIR/FLUID EXCHANGE;  Surgeon: Jalene Mullet, MD;  Location: [facility];  Service: Ophthalmology;  Laterality: Left;   CYSTOSCOPY W/ URETERAL STENT PLACEMENT  12/31/14   CYSTOSCOPY W/ URETERAL STENT PLACEMENT  05/16/2015   EYE SURGERY  2006    cataract, Dr Shanon Rosser   LASER PHOTO ABLATION Left 01/09/2016   Procedure: LASER PHOTO ABLATION;  Surgeon: Jalene Mullet, MD;  Location: [facility];  Service: Ophthalmology;  Laterality: Left;  Endolaser   Left ureteral stent placement  Week of 12/05/11   PARS PLANA VITRECTOMY Left 01/09/2016   Procedure: PARS PLANA VITRECTOMY WITH 25 GAUGE LEFT EYE ;  Surgeon: Jalene Mullet, MD;  Location: [facility];  Service: Ophthalmology;  Laterality: Left;   PERFLUORONE INJECTION Left 01/09/2016   Procedure: PERFLUORONE INJECTION;  Surgeon: Jalene Mullet, MD;  Location: [facility];  Service: Ophthalmology;  Laterality: Left;   PROSTATECTOMY  1996   Dr. Rosana Hoes   URETERAL STENT PLACEMENT  05/2020   [facility] every 3 months    URETERAL STENT PLACEMENT  09/23/2020   Patient gets replaced every 3 months        Family History  Problem Relation Age of Onset   Heart disease Father    Stroke Sister     Social History   Tobacco Use   Smoking status: Former    Types: Cigars    Quit date: 12/16/1968    Years since quitting: 52.5   Smokeless tobacco: Never  Vaping Use   Vaping Use: Never used  Substance Use Topics   Alcohol use: No    Alcohol/week: 0.0 standard drinks   Drug use: No    Home Medications Prior to Admission medications   Medication Sig Start Date End Date Taking? Authorizing Provider  docusate sodium (COLACE) 100 MG capsule Take 1 capsule (100 mg total) by mouth every 12 (twelve) hours. 06/07/21  Yes Charlesetta Shanks, MD  HYDROcodone-acetaminophen (NORCO/VICODIN) 5-325 MG tablet Take 1-2 tablets by mouth every 6 (six) hours as needed for moderate pain or severe pain. 06/07/21  Yes Charlesetta Shanks, MD  acetaminophen (TYLENOL) 650 MG CR tablet Take 650 mg by mouth as needed for pain.    [provider]  allopurinol (ZYLOPRIM) 100 MG tablet TAKE 2 TABLETS BY MOUTH EVERY DAY 04/06/21   Lauree Chandler, NP  aspirin 81 MG tablet Take 1 tablet (81 mg total) by mouth daily. 01/14/15   Blanchie Serve, MD   calcium carbonate (TUMS - DOSED IN MG ELEMENTAL CALCIUM) 500 MG chewable tablet Chew 1 tablet by mouth as needed for indigestion or heartburn.    [provider]  Calcium Carbonate-Vitamin D (CALCIUM 600+D PO) Take 1 tablet by mouth 2 (two) times daily.    [provider]  Cholecalciferol 1000 UNITS tablet Take 1,000 Units by mouth daily.    [provider]  doxazosin (CARDURA) 4 MG tablet TAKE 1 TABLET BY MOUTH EVERY DAY 05/20/21   Lauree Chandler, NP  ferrous sulfate 325 (65 FE) MG EC tablet Take 325 mg by mouth every other day.    [provider]  glucose blood (ONETOUCH VERIO) test strip Check blood sugar one to two times a week. Dx:E11.29 11/03/18   Lauree Chandler, NP  leuprolide,  6 Month, (ELIGARD) 45 MG injection Inject 45 mg into the skin every 6 (six) months. Filled by [facility] 08/20/15   [name], DO  lisinopril-hydrochlorothiazide (ZESTORETIC) 20-12.5 MG tablet TAKE 2 TABLETS BY MOUTH EVERY MORNING 05/20/21   Lauree Chandler, NP  metoprolol tartrate (LOPRESSOR) 50 MG tablet TAKE 1 TABLET BY MOUTH TWICE A DAY 05/19/21   Lauree Chandler, NP  Omega-3 Fatty Acids (FISH OIL) 1000 MG CAPS Take 1 capsule by mouth daily.    [provider]  pyridoxine (B-6) 100 MG tablet Take 100 mg by mouth daily.    [provider]  rosuvastatin (CRESTOR) 20 MG tablet TAKE 1 TABLET (20 MG TOTAL) BY MOUTH DAILY. MUST KEEP UPCOMING APPT FOR FURTHER REFILLS. 10/06/20   Jerline Pain, MD    Allergies    Abiraterone  Review of Systems   Review of Systems Constitutional: No fever chills malaise Respiratory: No shortness of breath no chest Physical Exam Updated Vital Signs BP 109/76   Pulse 65   Temp 98.7 F (37.1 C) (Oral)   Resp 14   SpO2 96%   Physical Exam Constitutional:      Appearance: Normal appearance.  HENT:     Mouth/Throat:     Pharynx: Oropharynx is clear.  Cardiovascular:     Rate and Rhythm: Normal rate and  regular rhythm.  Pulmonary:     Effort: Pulmonary effort is normal.     Breath sounds: Normal breath sounds.  Musculoskeletal:     Comments: Moderate effusion of the right knee.  No erythema.  Tender to palpation over the anterior knee.  No ecchymosis or laceration or abrasion.  Pain with attempted flexion.  Popliteal fossa soft calf soft.  Foot warm and dry without peripheral edema  Skin:    General: Skin is warm and dry.  Neurological:     General: No focal deficit present.     Mental Status: He is alert.     Coordination: Coordination normal.  Psychiatric:        Mood and Affect: Mood normal.    ED Results / Procedures / Treatments   Labs (all labs ordered are listed, but only abnormal results are displayed) Labs Reviewed - No data to display  EKG None  Radiology DG Knee Complete 4 Views Right  Result Date: 06/07/2021 CLINICAL DATA:  Right knee pain and swelling for 2 days since trailer hitch injury EXAM: RIGHT KNEE - COMPLETE 4+ VIEW COMPARISON:  10/19/2010 right knee radiographs FINDINGS: Moderate suprapatellar right knee joint effusion. No fracture or dislocation. No suspicious focal osseous lesions. Moderate tricompartmental right knee osteoarthritis, most prominent in the patellofemoral compartment. Small to moderate superior right patellar enthesophytes. Vascular calcifications in the posterior soft tissues. No radiopaque foreign bodies. IMPRESSION: 1. Moderate suprapatellar right knee joint effusion. No fracture or dislocation. 2. Moderate tricompartmental right knee osteoarthritis. Electronically Signed   By: Ilona Sorrel M.D.   On: 06/07/2021 10:23    Procedures Procedures   Medications Ordered in ED Medications - No data to display  ED Course  I have reviewed the triage vital signs and the nursing notes.  Pertinent labs & imaging results that were available during my care of the patient were reviewed by me and considered in my medical decision making (see chart for  details).    MDM Rules/Calculators/A&P  Patient presents with a moderately sized effusion of the right knee.  No erythema.  He describes a traumatic mechanism.  No signs of any superficial skin injury.  At this time not suspicious for septic joint.  No fractures present on x-ray.  Suspect ligamentous or meniscal tear.  Hemarthrosis versus reactive effusion.  At this time I do not think patient would benefit from joint aspiration. Detailed  Instructions are given for elevating icing and compression and pain control.  Recommended limiting weightbearing for transfers using a walker and short knee immobilizer.  Recommend follow-up with orthopedics. Final Clinical Impression(s) / ED Diagnoses Final diagnoses:  Internal derangement of right knee  Knee effusion, right    Rx / DC Orders ED Discharge Orders          Ordered    HYDROcodone-acetaminophen (NORCO/VICODIN) 5-325 MG tablet  Every 6 hours PRN        06/07/21 1136    docusate sodium (COLACE) 100 MG capsule  Every 12 hours        06/07/21 1136             Charlesetta Shanks, MD 06/07/21 1147

## 2021-06-07 NOTE — Progress Notes (Signed)
Orthopedic Tech Progress Note Patient Details:  Jeremiah Collins October 19, 1934 [id number]  Per MD, was told to apply short KNEE IMMOBILIZER .  Ortho Devices Type of Ortho Device: Ace wrap, Knee Immobilizer Ortho Device/Splint Location: RLE Ortho Device/Splint Interventions: Ordered, Application, Adjustment   Post Interventions Patient Tolerated: Well Instructions Provided: Care of device  [name] 06/07/2021, 12:16 PM

## 2021-08-04 ENCOUNTER — Other Ambulatory Visit: Payer: Self-pay

## 2021-08-04 ENCOUNTER — Other Ambulatory Visit: Payer: Medicare Other

## 2021-08-04 DIAGNOSIS — E1142 Type 2 diabetes mellitus with diabetic polyneuropathy: Secondary | ICD-10-CM

## 2021-08-04 DIAGNOSIS — E782 Mixed hyperlipidemia: Secondary | ICD-10-CM

## 2021-08-04 DIAGNOSIS — I10 Essential (primary) hypertension: Secondary | ICD-10-CM

## 2021-08-05 LAB — CBC WITH DIFFERENTIAL/PLATELET
Absolute Monocytes: 385 cells/uL (ref 200–950)
Basophils Absolute: 39 cells/uL (ref 0–200)
Basophils Relative: 0.5 %
Eosinophils Absolute: 131 cells/uL (ref 15–500)
Eosinophils Relative: 1.7 %
HCT: 33.6 % — ABNORMAL LOW (ref 38.5–50.0)
Hemoglobin: 10.8 g/dL — ABNORMAL LOW (ref 13.2–17.1)
Lymphs Abs: 1894 cells/uL (ref 850–3900)
MCH: 28.6 pg (ref 27.0–33.0)
MCHC: 32.1 g/dL (ref 32.0–36.0)
MCV: 88.9 fL (ref 80.0–100.0)
MPV: 9.9 fL (ref 7.5–12.5)
Monocytes Relative: 5 %
Neutro Abs: 5251 cells/uL (ref 1500–7800)
Neutrophils Relative %: 68.2 %
Platelets: 158 10*3/uL (ref 140–400)
RBC: 3.78 10*6/uL — ABNORMAL LOW (ref 4.20–5.80)
RDW: 14.6 % (ref 11.0–15.0)
Total Lymphocyte: 24.6 %
WBC: 7.7 10*3/uL (ref 3.8–10.8)

## 2021-08-05 LAB — LIPID PANEL
Cholesterol: 95 mg/dL (ref <200)
HDL: 37 mg/dL — ABNORMAL LOW (ref >=40)
LDL Cholesterol (Calc): 40 mg/dL (calc)
Non-HDL Cholesterol (Calc): 58 mg/dL (calc) (ref <130)
Total CHOL/HDL Ratio: 2.6 (calc) (ref <5.0)
Triglycerides: 94 mg/dL (ref <150)

## 2021-08-05 LAB — COMPLETE METABOLIC PANEL WITH GFR
AG Ratio: 1.7 (calc) (ref 1.0–2.5)
ALT: 13 U/L (ref 9–46)
AST: 14 U/L (ref 10–35)
Albumin: 3.7 g/dL (ref 3.6–5.1)
Alkaline phosphatase (APISO): 65 U/L (ref 35–144)
BUN/Creatinine Ratio: 19 (calc) (ref 6–22)
BUN: 26 mg/dL — ABNORMAL HIGH (ref 7–25)
CO2: 25 mmol/L (ref 20–32)
Calcium: 9.3 mg/dL (ref 8.6–10.3)
Chloride: 106 mmol/L (ref 98–110)
Creat: 1.4 mg/dL — ABNORMAL HIGH (ref 0.70–1.22)
Globulin: 2.2 g/dL (calc) (ref 1.9–3.7)
Glucose, Bld: 125 mg/dL — ABNORMAL HIGH (ref 65–99)
Potassium: 4.1 mmol/L (ref 3.5–5.3)
Sodium: 140 mmol/L (ref 135–146)
Total Bilirubin: 0.5 mg/dL (ref 0.2–1.2)
Total Protein: 5.9 g/dL — ABNORMAL LOW (ref 6.1–8.1)
eGFR: 50 mL/min/{1.73_m2} — ABNORMAL LOW (ref >=60)

## 2021-08-05 LAB — HEMOGLOBIN A1C
Hgb A1c MFr Bld: 5.8 % of total Hgb — ABNORMAL HIGH (ref <5.7)
Mean Plasma Glucose: 120 mg/dL
eAG (mmol/L): 6.6 mmol/L

## 2021-08-07 ENCOUNTER — Ambulatory Visit (INDEPENDENT_AMBULATORY_CARE_PROVIDER_SITE_OTHER): Payer: Medicare Other | Admitting: Nurse Practitioner

## 2021-08-07 ENCOUNTER — Encounter: Payer: Self-pay | Admitting: Nurse Practitioner

## 2021-08-07 ENCOUNTER — Other Ambulatory Visit: Payer: Self-pay

## 2021-08-07 VITALS — BP 118/76 | HR 64 | Temp 97.3°F | Wt 267.0 lb

## 2021-08-07 DIAGNOSIS — M1990 Unspecified osteoarthritis, unspecified site: Secondary | ICD-10-CM

## 2021-08-07 DIAGNOSIS — E1142 Type 2 diabetes mellitus with diabetic polyneuropathy: Secondary | ICD-10-CM

## 2021-08-07 DIAGNOSIS — Z23 Encounter for immunization: Secondary | ICD-10-CM | POA: Diagnosis not present

## 2021-08-07 DIAGNOSIS — I420 Dilated cardiomyopathy: Secondary | ICD-10-CM | POA: Diagnosis not present

## 2021-08-07 DIAGNOSIS — L609 Nail disorder, unspecified: Secondary | ICD-10-CM

## 2021-08-07 DIAGNOSIS — E1122 Type 2 diabetes mellitus with diabetic chronic kidney disease: Secondary | ICD-10-CM | POA: Diagnosis not present

## 2021-08-07 DIAGNOSIS — M1A9XX0 Chronic gout, unspecified, without tophus (tophi): Secondary | ICD-10-CM

## 2021-08-07 DIAGNOSIS — K5903 Drug induced constipation: Secondary | ICD-10-CM

## 2021-08-07 DIAGNOSIS — C61 Malignant neoplasm of prostate: Secondary | ICD-10-CM

## 2021-08-07 DIAGNOSIS — N182 Chronic kidney disease, stage 2 (mild): Secondary | ICD-10-CM

## 2021-08-07 DIAGNOSIS — Z6837 Body mass index (BMI) 37.0-37.9, adult: Secondary | ICD-10-CM

## 2021-08-07 DIAGNOSIS — I10 Essential (primary) hypertension: Secondary | ICD-10-CM

## 2021-08-07 NOTE — Progress Notes (Signed)
Careteam: Patient Care Team: Lauree Chandler, NP as PCP - General (Geriatric Medicine) Jerline Pain, MD as PCP - Cardiology (Cardiology) Myrlene Broker, MD as Attending Physician (Urology) [name], MD as Consulting Physician (Ophthalmology) [name], MD as Consulting Physician (Ophthalmology)  PLACE OF SERVICE:  [facility] Directive information Does Patient Have a Medical Advance Directive?: Yes, Type of Advance Directive: Out of facility DNR (pink MOST or yellow form), Pre-existing out of facility DNR order (yellow form or pink MOST form): Pink MOST form placed in chart (order not valid for inpatient use), Does patient want to make changes to medical advance directive?: No - Patient declined  Allergies  Allergen Reactions   Abiraterone Nausea And Vomiting and Other (See Comments)    Other reaction(s): Increased Heart Rate (intolerance)     Chief Complaint  Patient presents with   Medical Management of Chronic Issues    6 month follow-up and discuss labs (copy printed). Discuss need for shingrix and covid. Foot exam and flu vaccine today.     HPI: Patient is a 87 y.o. male for routine follow up.   Continues to see Dr Rosana Hoes for stent exchange every 3 months. Has bleeding with stent exchange. Reports he is normally given medication for constipation but did not get last time. Continues to be bothered by this.   With metastatic prostate cancer. No pain at this time.continues with routine follow up with oncology   Protein levels low -eats 2 meals a day.   A1c at goal. Diet controlled. Continues with neuropathy no low  Hyperlipidemia- on crestor.   Review of Systems:  Review of Systems  Constitutional:  Negative for chills, fever and weight loss.  HENT:  Negative for tinnitus.   Respiratory:  Negative for cough, sputum production and shortness of breath.   Cardiovascular:  Negative for chest pain, palpitations and leg swelling.   Gastrointestinal:  Negative for abdominal pain, constipation, diarrhea and heartburn.  Genitourinary:  Negative for dysuria, frequency and urgency.  Musculoskeletal:  Negative for back pain, falls, joint pain and myalgias.  Skin: Negative.   Neurological:  Negative for dizziness and headaches.  Psychiatric/Behavioral:  Negative for depression and memory loss. The patient does not have insomnia.    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/28/2006   Bone metastases ([facility])    left mid tibial shaft   Cataract    Dr.Groat   Cellulitis and abscess of leg, except foot 01/17/2012   Chest pain, unspecified 04/28/2004   Closed fracture of five ribs 03/24/2004   First degree atrioventricular block 04/25/2007   Gross hematuria 09/06/2011   History of radiation therapy 04/03/14- 04/17/14   mid to distal left tibia 3000 cGy in 10 sessions   HTN (hypertension)    Hx of radiation therapy 11/1998   prostate fossa - 6040 cGy, 33 fx, Dr Danny Lawless   Osteoarthrosis involving, or with mention of more than one site, but not specified as generalized, multiple sites 10/22/2010   Other abnormal blood chemistry 04/29/1991   Other and unspecified hyperlipidemia 01/02/2013   Palpitations 04/28/2004   Prostate cancer ([place]) 12/16/1994   gleason 7   Reflux esophagitis 05/10/2008   Routine general medical examination at a health care facility    Spinal stenosis, unspecified region other than cervical 04/29/1995   Type II or unspecified type diabetes mellitus without mention of complication, uncontrolled    Past Surgical History:  Procedure Laterality Date   AIR/FLUID EXCHANGE Left 01/09/2016   Procedure: AIR/FLUID EXCHANGE;  Surgeon: Jalene Mullet, MD;  Location: [facility];  Service: Ophthalmology;  Laterality: Left;   CYSTOSCOPY W/ URETERAL STENT PLACEMENT  12/31/14   CYSTOSCOPY W/ URETERAL STENT PLACEMENT  05/16/2015   EYE SURGERY  2006   cataract, Dr Shanon Rosser   LASER  PHOTO ABLATION Left 01/09/2016   Procedure: LASER PHOTO ABLATION;  Surgeon: Jalene Mullet, MD;  Location: [facility];  Service: Ophthalmology;  Laterality: Left;  Endolaser   Left ureteral stent placement  Week of 12/05/11   PARS PLANA VITRECTOMY Left 01/09/2016   Procedure: PARS PLANA VITRECTOMY WITH 25 GAUGE LEFT EYE ;  Surgeon: Jalene Mullet, MD;  Location: [facility];  Service: Ophthalmology;  Laterality: Left;   PERFLUORONE INJECTION Left 01/09/2016   Procedure: PERFLUORONE INJECTION;  Surgeon: Jalene Mullet, MD;  Location: [facility] [place] [facility];  Service: Ophthalmology;  Laterality: Left;   PROSTATECTOMY  1996   Dr. Rosana Hoes   URETERAL STENT PLACEMENT  05/2020   [facility] every 3 months    URETERAL STENT PLACEMENT  09/23/2020   Patient gets replaced every 3 months    Social History:   reports that he quit smoking about 52 years ago. His smoking use included cigars. He has never used smokeless tobacco. He reports that he does not drink alcohol and does not use drugs.  Family History  Problem Relation Age of Onset   Heart disease Father    Stroke Sister     Medications: Patient's Medications  New Prescriptions   No medications on file  Previous Medications   ACETAMINOPHEN (TYLENOL) 650 MG CR TABLET    Take 650 mg by mouth as needed for pain.   ALLOPURINOL (ZYLOPRIM) 100 MG TABLET    TAKE 2 TABLETS BY MOUTH EVERY DAY   ASPIRIN 81 MG TABLET    Take 1 tablet (81 mg total) by mouth daily.   CALCIUM CARBONATE (TUMS - DOSED IN MG ELEMENTAL CALCIUM) 500 MG CHEWABLE TABLET    Chew 1 tablet by mouth as needed for indigestion or heartburn.   CALCIUM CARBONATE-VITAMIN D (CALCIUM 600+D PO)    Take 1 tablet by mouth 2 (two) times daily.   CHOLECALCIFEROL 1000 UNITS TABLET    Take 1,000 Units by mouth daily.   DOCUSATE SODIUM (COLACE) 100 MG CAPSULE    Take 1 capsule (100 mg total) by mouth every 12 (twelve) hours.   DOXAZOSIN (CARDURA) 4 MG TABLET    TAKE 1 TABLET BY MOUTH EVERY DAY   FERROUS SULFATE 325 (65 FE) MG  EC TABLET    Take 325 mg by mouth every other day.   GLUCOSE BLOOD (ONETOUCH VERIO) TEST STRIP    Check blood sugar one to two times a week. Dx:E11.29   LEUPROLIDE, 6 MONTH, (ELIGARD) 45 MG INJECTION    Inject 45 mg into the skin every 6 (six) months. Filled by [facility] (ZESTORETIC) 20-12.5 MG TABLET    TAKE 2 TABLETS BY MOUTH EVERY MORNING   METOPROLOL TARTRATE (LOPRESSOR) 50 MG TABLET    TAKE 1 TABLET BY MOUTH TWICE A DAY   OMEGA-3 FATTY ACIDS (FISH OIL) 1000 MG CAPS    Take 1 capsule by mouth daily.   PYRIDOXINE (B-6) 100 MG TABLET    Take 100 mg by mouth daily.   ROSUVASTATIN (CRESTOR) 20 MG TABLET    TAKE 1 TABLET (20 MG TOTAL) BY MOUTH DAILY. MUST KEEP UPCOMING APPT FOR FURTHER  REFILLS.  Modified Medications   No medications on file  Discontinued Medications   HYDROCODONE-ACETAMINOPHEN (NORCO/VICODIN) 5-325 MG TABLET    Take 1-2 tablets by mouth every 6 (six) hours as needed for moderate pain or severe pain.    Physical Exam:  Vitals:   08/07/21 0852  BP: 118/76  Pulse: 64  Temp: (!) 97.3 F (36.3 C)  TempSrc: Temporal  SpO2: 95%  Weight: 267 lb (121.1 kg)   Body mass index is 37.24 kg/m. Wt Readings from Last 3 Encounters:  08/07/21 267 lb (121.1 kg)  01/28/21 268 lb (121.6 kg)  07/30/20 271 lb (122.9 kg)    Physical Exam Constitutional:      General: He is not in acute distress.    Appearance: He is well-developed. He is not diaphoretic.  HENT:     Head: Normocephalic and atraumatic.     Right Ear: External ear normal.     Left Ear: External ear normal.     Mouth/Throat:     Pharynx: No oropharyngeal exudate.  Eyes:     Conjunctiva/sclera: Conjunctivae normal.     Pupils: Pupils are equal, round, and reactive to light.  Cardiovascular:     Rate and Rhythm: Normal rate and regular rhythm.     Heart sounds: Normal heart sounds.  Pulmonary:     Effort: Pulmonary effort is normal.     Breath sounds: Normal breath sounds.   Abdominal:     General: Bowel sounds are normal.     Palpations: Abdomen is soft.  Musculoskeletal:        General: No tenderness.     Cervical back: Normal range of motion and neck supple.     Right lower leg: No edema.     Left lower leg: No edema.  Skin:    General: Skin is warm and dry.  Neurological:     Mental Status: He is alert and oriented to person, place, and time.    Labs reviewed: Basic Metabolic Panel: Recent Labs    01/28/21 1007 08/04/21 0812  NA 139 140  K 4.4 4.1  CL 107 106  CO2 24 25  GLUCOSE 126* 125*  BUN 36* 26*  CREATININE 1.54* 1.40*  CALCIUM 9.3 9.3   Liver Function Tests: Recent Labs    01/28/21 1007 08/04/21 0812  AST 12 14  ALT 8* 13  BILITOT 0.4 0.5  PROT 6.0* 5.9*   No results for input(s): LIPASE, AMYLASE in the last 8760 hours. No results for input(s): AMMONIA in the last 8760 hours. CBC: Recent Labs    01/28/21 1007 08/04/21 0812  WBC 8.0 7.7  NEUTROABS 5,312 5,251  HGB 11.5* 10.8*  HCT 34.6* 33.6*  MCV 85.9 88.9  PLT 194 158   Lipid Panel: Recent Labs    08/04/21 0812  CHOL 95  HDL 37*  LDLCALC 40  TRIG 94  CHOLHDL 2.6   TSH: No results for input(s): TSH in the last 8760 hours. A1C: Lab Results  Component Value Date   HGBA1C 5.8 (H) 08/04/2021     Assessment/Plan 1. Need for influenza vaccination - Flu Vaccine QUAD High Dose(Fluad)  2. Congestive dilated cardiomyopathy (HCC) -stable, without worsening shortness of breath, swelling, chest pains. Euvolemic at this time. Followed by cardiology yearly  3. Essential hypertension, benign --stable. Goal bp <140/90. Continue on lisinopril-hctz and metoprolol with low sodium diet.   4. Controlled type 2 diabetes mellitus with stage 2 chronic kidney disease, without long-term current use of insulin (  [place]) Diet controlled. Encouraged dietary compliance, routine foot care/monitoring and to keep up with diabetic eye exams through ophthalmology   5. Diabetic  polyneuropathy associated with type 2 diabetes mellitus (HCC) Stable, without worsening of symptoms or pain.   6. Osteoarthritis, unspecified osteoarthritis type, unspecified site Stable, uses tylenol PRN.   7. Malignant neoplasm of prostate ([place]) Continued to be follow up oncologist and urologist. Continues on leuprolide injection  8. Class 2 severe obesity due to excess calories with serious comorbidity and body mass index (BMI) of 37.0 to 37.9 in adult [facility]) -education provided on healthy weight loss through increase in physical activity as tolerated and proper nutrition.   9. Drug-induced constipation -discussed lifestyle modifications and OTC medications that can help.   10. Nail disorder, unspecified Thick, long toenails noted, trimmed in office with pt consent. Tolerated well.     Next appt: 6 months.  Carlos American. [place], [facility] [phone]

## 2021-08-07 NOTE — Patient Instructions (Signed)
Can try colace or senna as needed to help with constipation. These are over the counter.

## 2021-08-24 ENCOUNTER — Other Ambulatory Visit: Payer: Self-pay

## 2021-08-24 ENCOUNTER — Ambulatory Visit (INDEPENDENT_AMBULATORY_CARE_PROVIDER_SITE_OTHER): Payer: Medicare Other | Admitting: Family

## 2021-08-24 ENCOUNTER — Encounter (HOSPITAL_BASED_OUTPATIENT_CLINIC_OR_DEPARTMENT_OTHER): Payer: Self-pay | Admitting: Family

## 2021-08-24 VITALS — BP 110/58 | HR 59 | Ht 71.0 in | Wt 269.0 lb

## 2021-08-24 DIAGNOSIS — I350 Nonrheumatic aortic (valve) stenosis: Secondary | ICD-10-CM | POA: Diagnosis not present

## 2021-08-24 DIAGNOSIS — I3481 Nonrheumatic mitral (valve) annulus calcification: Secondary | ICD-10-CM | POA: Diagnosis not present

## 2021-08-24 DIAGNOSIS — E782 Mixed hyperlipidemia: Secondary | ICD-10-CM | POA: Diagnosis not present

## 2021-08-24 DIAGNOSIS — I10 Essential (primary) hypertension: Secondary | ICD-10-CM | POA: Diagnosis not present

## 2021-08-24 DIAGNOSIS — I6523 Occlusion and stenosis of bilateral carotid arteries: Secondary | ICD-10-CM

## 2021-08-24 NOTE — Progress Notes (Signed)
Office Visit    Patient Name: Jeremiah Collins Date of Encounter: 08/24/2021  PCP:  Lauree Chandler, NP   [place]  Cardiologist:  Candee Furbish, MD  Advanced Practice Provider:  No care team member to display Electrophysiologist:  None      Chief Complaint    SEARS ORAN is a 87 y.o. male with a hx of mitral annular calcification, metastatic prostate cancer, bilateral carotid artery disease, hypertension, DM2, CKDII, hyperlipidemia, aortic stenosis presents today for follow-up of mitral annular calcification  Past Medical History    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/28/2006   Bone metastases ([place])    left mid tibial shaft   Cataract    Dr.Groat   Cellulitis and abscess of leg, except foot 01/17/2012   Chest pain, unspecified 04/28/2004   Closed fracture of five ribs 03/24/2004   First degree atrioventricular block 04/25/2007   Gross hematuria 09/06/2011   History of radiation therapy 04/03/14- 04/17/14   mid to distal left tibia 3000 cGy in 10 sessions   HTN (hypertension)    Hx of radiation therapy 11/1998   prostate fossa - 6040 cGy, 33 fx, Dr Danny Lawless   Osteoarthrosis involving, or with mention of more than one site, but not specified as generalized, multiple sites 10/22/2010   Other abnormal blood chemistry 04/29/1991   Other and unspecified hyperlipidemia 01/02/2013   Palpitations 04/28/2004   Prostate cancer ([place]) 12/16/1994   gleason 7   Reflux esophagitis 05/10/2008   Routine general medical examination at a health care facility    Spinal stenosis, unspecified region other than cervical 04/29/1995   Type II or unspecified type diabetes mellitus without mention of complication, uncontrolled    Past Surgical History:  Procedure Laterality Date   AIR/FLUID EXCHANGE Left 01/09/2016   Procedure: AIR/FLUID EXCHANGE;  Surgeon: Jalene Mullet, MD;  Location: [place];  Service:  Ophthalmology;  Laterality: Left;   CYSTOSCOPY W/ URETERAL STENT PLACEMENT  12/31/14   CYSTOSCOPY W/ URETERAL STENT PLACEMENT  05/16/2015   EYE SURGERY  2006   cataract, Dr Shanon Rosser   LASER PHOTO ABLATION Left 01/09/2016   Procedure: LASER PHOTO ABLATION;  Surgeon: Jalene Mullet, MD;  Location: [place];  Service: Ophthalmology;  Laterality: Left;  Endolaser   Left ureteral stent placement  Week of 12/05/11   PARS PLANA VITRECTOMY Left 01/09/2016   Procedure: PARS PLANA VITRECTOMY WITH 25 GAUGE LEFT EYE ;  Surgeon: Jalene Mullet, MD;  Location: [place];  Service: Ophthalmology;  Laterality: Left;   PERFLUORONE INJECTION Left 01/09/2016   Procedure: PERFLUORONE INJECTION;  Surgeon: Jalene Mullet, MD;  Location: [place];  Service: Ophthalmology;  Laterality: Left;   PROSTATECTOMY  1996   Dr. Rosana Hoes   URETERAL STENT PLACEMENT  05/2020   [facility] every 3 months    URETERAL STENT PLACEMENT  09/23/2020   Patient gets replaced every 3 months     Allergies  Allergies  Allergen Reactions   Abiraterone Nausea And Vomiting and Other (See Comments)    Other reaction(s): Increased Heart Rate (intolerance)     History of Present Illness    Jeremiah Collins is a 87 y.o. male with a hx of mitral annular calcification, metastatic prostate cancer, bilateral carotid artery disease, hypertension, Dm2, CKDII, hyperlipidemia, aortic stenosis last seen 07/2020 by Dr. Marlou Porch.  Most recent echocardiogram September 2020 with LVEF 55-60%, severe LVH, diastolic dysfunction, RV normal size and  function, LA moderately dilated, mild thickening of mitral valve leaflet, moderate mitral annular calcification, tricuspid aortic valve with mild aortic stenosis.  Most recent carotid duplex of #2021 with bilateral 1-79% stenosis which was overall unchanged from previous study in 2018.  He presents today for follow-up. 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 Denies chest pain, pressure, tightness. Endorses  dyspnea on exertion which is about the same as usual. No shortness of breath at rest. No orthopnea, PND.   EKGs/Labs/Other Studies Reviewed:   The following studies were reviewed today:  Echocardiogram 07/26/2019:  1. The left ventricle has normal systolic function, with an ejection  fraction of 55-60%. The cavity size was normal. There is severely  increased left ventricular wall thickness. Left ventricular diastolic  Doppler parameters are consistent with impaired  relaxation.   2. The right ventricle has normal systolic function. The cavity was  normal. There is no increase in right ventricular wall thickness.   3. Left atrial size was moderately dilated.   4. The mitral valve is abnormal. Mild thickening of the mitral valve  leaflet. There is moderate mitral annular calcification present.   5. The aortic valve is tricuspid. Moderate thickening of the aortic  valve. Mild calcification of the aortic valve. Aortic valve regurgitation  is trivial by color flow Doppler. Mild stenosis of the aortic valve.   6. The aorta is normal unless otherwise noted.   7. The left ventricular function has unchanged.   Carotid duplex 07/2020  Summary:  Right Carotid: Velocities in the right ICA are consistent with a 1-39%  stenosis.   Left Carotid: Velocities in the left ICA are consistent with a 1-39%  stenosis.   Vertebrals: Bilateral vertebral arteries demonstrate antegrade flow.  Subclavians: Normal flow hemodynamics were seen in bilateral subclavian               arteries.   EKG:  EKG is  ordered today.  The ekg ordered today demonstrates SB 59 bpm with no acute ST/T wave changes.   Recent Labs: 08/04/2021: ALT 13; BUN 26; Creat 1.40; Hemoglobin 10.8; Platelets 158; Potassium 4.1; Sodium 140  Recent Lipid Panel    Component Value Date/Time   CHOL 95 08/04/2021 0812   CHOL 148 04/21/2016 0804   TRIG 94 08/04/2021 0812   HDL 37 (L) 08/04/2021 0812   HDL 40 04/21/2016 0804   CHOLHDL 2.6  08/04/2021 0812   VLDL 17 06/06/2017 0816   LDLCALC 40 08/04/2021 0812    Home Medications   Current Meds  Medication Sig   acetaminophen (TYLENOL) 650 MG CR tablet Take 650 mg by mouth as needed for pain.   allopurinol (ZYLOPRIM) 100 MG tablet TAKE 2 TABLETS BY MOUTH EVERY DAY   aspirin 81 MG tablet Take 1 tablet (81 mg total) by mouth daily.   calcium carbonate (TUMS - DOSED IN MG ELEMENTAL CALCIUM) 500 MG chewable tablet Chew 1 tablet by mouth as needed for indigestion or heartburn.   Cholecalciferol 1000 UNITS tablet Take 1,000 Units by mouth daily.   doxazosin (CARDURA) 4 MG tablet TAKE 1 TABLET BY MOUTH EVERY DAY   ferrous sulfate 325 (65 FE) MG EC tablet Take 325 mg by mouth every other day.   glucose blood (ONETOUCH VERIO) test strip Check blood sugar one to two times a week. Dx:E11.29   leuprolide, 6 Month, (ELIGARD) 45 MG injection Inject 45 mg into the skin every 6 (six) months. Filled by [facility]   lisinopril-hydrochlorothiazide (ZESTORETIC) 20-12.5 MG tablet TAKE 2 TABLETS BY MOUTH EVERY MORNING   metoprolol tartrate (LOPRESSOR) 50 MG tablet TAKE 1 TABLET BY MOUTH TWICE A DAY   Omega-3 Fatty Acids (FISH OIL) 1000 MG CAPS Take 1 capsule by mouth daily.   pyridoxine (B-6) 100 MG tablet Take 100 mg by mouth daily.   rosuvastatin (CRESTOR) 20 MG tablet TAKE 1 TABLET (20 MG TOTAL) BY MOUTH DAILY. MUST KEEP UPCOMING APPT FOR FURTHER REFILLS.     Review of Systems      All other systems reviewed and are otherwise negative except as noted above.  Physical Exam    VS:  BP (!) 110/58   Pulse (!) 59   Ht 5\' 11"  (1.803 m)   Wt 269 lb (122 kg)   SpO2 98%   BMI 37.52 kg/m  , BMI Body mass index is 37.52 kg/m.  Wt Readings from Last 3 Encounters:  08/24/21 269 lb (122 kg)  08/07/21 267 lb (121.1 kg)  01/28/21 268 lb (121.6 kg)     GEN: Well nourished, overweight, well developed, in no acute distress. HEENT: normal. Neck: Supple, no JVD, carotid bruits, or  masses. Cardiac: bradycardia, RRR, no rubs, or gallops. Gr 2/6 systolic murmur.  No clubbing, cyanosis. Non pitting bilateral pretibial edema. .  Radials/PT 2+ and equal bilaterally.  Respiratory:  Respirations regular and unlabored, clear to auscultation bilaterally. GI: Soft, nontender, nondistended. MS: No deformity or atrophy. Skin: Warm and dry, no rash. Neuro:  Strength and sensation are intact. Psych: Normal affect.  Assessment & Plan    Mitral annular calcification / Aortic stenosis - No worsening dyspnea, no chest pain, no syncope. Continue optimal BP and lipid control. Repeat echo prior to next visit for monitoring. Heart healthy diet and regular cardiovascular exercise encouraged.    Hyperlipidemia - Continue Rosuvastatin 20mg  daily. 07/2021 LDL 40.   Hypertension - BP well controlled. Continue current antihypertensive regimen as managed by PCP  DM2 - Continue to follow with PCP.   Carotid artery disease - 07/2020 bilatearal 1-39% stenosis. Continue aspirin, statin.   Prostate cancer - Follows with oncologist and urologist.  Disposition: Follow up in 1 year(s) with Dr. Marlou Porch or APP with echocardiogram prior.  Signed, Loel Dubonnet, NP 08/24/2021, 8:23 AM [place]

## 2021-08-24 NOTE — Patient Instructions (Addendum)
Medication Instructions:  Continue your current medications.   *If you need a refill on your cardiac medications before your next appointment, please call your pharmacy*   Lab Work: None ordered today.  Your cholesterol numbers in September looked great!  Testing/Procedures: Your EKG today showed sinus bradycardia which is stable finding.   Your physician has requested that you have an echocardiogram in 1 year. Echocardiography is a painless test that uses sound waves to create images of your heart. It provides your doctor with information about the size and shape of your heart and how well your heart's chambers and valves are working. This procedure takes approximately one hour. There are no restrictions for this procedure.    Follow-Up: At [facility], you and your health needs are our priority.  As part of our continuing mission to provide you with exceptional heart care, we have created designated Provider Care Teams.  These Care Teams include your primary Cardiologist (physician) and Advanced Practice Providers (APPs -  Physician Assistants and Nurse Practitioners) who all work together to provide you with the care you need, when you need it.  We recommend signing up for the patient portal called "MyChart".  Sign up information is provided on this After Visit Summary.  MyChart is used to connect with patients for Virtual Visits (Telemedicine).  Patients are able to view lab/test results, encounter notes, upcoming appointments, etc.  Non-urgent messages can be sent to your provider as well.   To learn more about what you can do with MyChart, go to [email or web address].    Your next appointment:   1 year(s) (with echocardiogram beforehand)  The format for your next appointment:   In Person  Provider:   You may see Candee Furbish, MD or one of the following Advanced Practice Providers on your designated Care Team:   Cecilie Kicks, NP Loel Dubonnet, NP    Other  Instructions  Heart Healthy Diet Recommendations: A low-salt diet is recommended. Meats should be grilled, baked, or boiled. Avoid fried foods. Focus on lean protein sources like fish or chicken with vegetables and fruits. The American Heart Association is a Microbiologist!   Exercise recommendations: The American Heart Association recommends 150 minutes of moderate intensity exercise weekly. Try 30 minutes of moderate intensity exercise 4-5 times per week. This could include walking, jogging, or swimming.

## 2021-08-31 ENCOUNTER — Ambulatory Visit: Payer: Medicare Other | Admitting: Podiatry

## 2021-09-02 ENCOUNTER — Ambulatory Visit: Payer: Medicare Other | Admitting: Cardiology

## 2021-09-24 ENCOUNTER — Other Ambulatory Visit: Payer: Self-pay | Admitting: Cardiology

## 2021-09-25 ENCOUNTER — Other Ambulatory Visit: Payer: Self-pay | Admitting: Nurse Practitioner

## 2021-09-25 DIAGNOSIS — M109 Gout, unspecified: Secondary | ICD-10-CM

## 2021-09-25 DIAGNOSIS — I10 Essential (primary) hypertension: Secondary | ICD-10-CM

## 2021-09-25 DIAGNOSIS — I517 Cardiomegaly: Secondary | ICD-10-CM

## 2021-09-29 ENCOUNTER — Encounter: Payer: Self-pay | Admitting: Nurse Practitioner

## 2021-09-29 ENCOUNTER — Ambulatory Visit (INDEPENDENT_AMBULATORY_CARE_PROVIDER_SITE_OTHER): Payer: Medicare Other | Admitting: Nurse Practitioner

## 2021-09-29 ENCOUNTER — Other Ambulatory Visit: Payer: Self-pay

## 2021-09-29 ENCOUNTER — Telehealth: Payer: Self-pay

## 2021-09-29 DIAGNOSIS — Z0000 Encounter for general adult medical examination without abnormal findings: Secondary | ICD-10-CM

## 2021-09-29 NOTE — Progress Notes (Signed)
This service is provided via telemedicine  No vital signs collected/recorded due to the encounter was a telemedicine visit.   Location of patient (ex: home, work):  Home  Patient consents to a telephone visit:  Yes, see encounter dated 09/29/2021  Location of the provider (ex: office, home):  [place]  Name of any referring provider:  N/A  Names of all persons participating in the telemedicine service and their role in the encounter:  Sherrie Mustache, Nurse Practitioner, Carroll Kinds, CMA, and patient.   Time spent on call:  10 minutes with medical assistant

## 2021-09-29 NOTE — Progress Notes (Signed)
Subjective:   Jeremiah Collins is a 87 y.o. male who presents for Medicare Annual/Subsequent preventive examination.  Review of Systems     Cardiac Risk Factors include: obesity (BMI >30kg/m2);advanced age (>77men, >76 women);dyslipidemia;hypertension;male gender     Objective:    There were no vitals filed for this visit. There is no height or weight on file to calculate BMI.  Advanced Directives 09/29/2021 08/07/2021 09/24/2020 01/28/2020 09/24/2019 09/24/2019 06/22/2019  Does Patient Have a Medical Advance Directive? Yes Yes Yes No No No No  Type of Advance Directive Out of facility DNR (pink MOST or yellow form) Out of facility DNR (pink MOST or yellow form) Out of facility DNR (pink MOST or yellow form) - - - -  Does patient want to make changes to medical advance directive? No - Patient declined No - Patient declined No - Patient declined - - - -  Would patient like information on creating a medical advance directive? - - - No - Patient declined No - Patient declined No - Patient declined -  Pre-existing out of facility DNR order (yellow form or pink MOST form) Pink MOST form placed in chart (order not valid for inpatient use) Pink MOST form placed in chart (order not valid for inpatient use) - - - - -    Current Medications (verified) Outpatient Encounter Medications as of 09/29/2021  Medication Sig   acetaminophen (TYLENOL) 650 MG CR tablet Take 650 mg by mouth as needed for pain.   allopurinol (ZYLOPRIM) 100 MG tablet TAKE 2 TABLETS BY MOUTH EVERY DAY   aspirin 81 MG tablet Take 1 tablet (81 mg total) by mouth daily.   calcium carbonate (TUMS - DOSED IN MG ELEMENTAL CALCIUM) 500 MG chewable tablet Chew 1 tablet by mouth as needed for indigestion or heartburn.   Cholecalciferol 1000 UNITS tablet Take 1,000 Units by mouth daily.   doxazosin (CARDURA) 4 MG tablet TAKE 1 TABLET BY MOUTH EVERY DAY   ferrous sulfate 325 (65 FE) MG EC tablet Take 325 mg by mouth every other day.    glucose blood (ONETOUCH VERIO) test strip Check blood sugar one to two times a week. Dx:E11.29   leuprolide, 6 Month, (ELIGARD) 45 MG injection Inject 45 mg into the skin every 6 (six) months. Filled by [facility]   lisinopril-hydrochlorothiazide (ZESTORETIC) 20-12.5 MG tablet TAKE 2 TABLETS BY MOUTH EVERY MORNING   metoprolol tartrate (LOPRESSOR) 50 MG tablet TAKE 1 TABLET BY MOUTH TWICE A DAY   Omega-3 Fatty Acids (FISH OIL) 1000 MG CAPS Take 1 capsule by mouth daily.   pyridoxine (B-6) 100 MG tablet Take 100 mg by mouth daily.   rosuvastatin (CRESTOR) 20 MG tablet TAKE 1 TABLET (20 MG TOTAL) BY MOUTH DAILY. MUST KEEP UPCOMING APPT FOR FURTHER REFILLS.   Calcium Carb-Cholecalciferol 600-5 MG-MCG TABS Take 2 tablets by mouth daily.   No facility-administered encounter medications on file as of 09/29/2021.    Allergies (verified) Abiraterone   History: 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/28/2006   Bone metastases ([place])    left mid tibial shaft   Cataract    Dr.Groat   Cellulitis and abscess of leg, except foot 01/17/2012   Chest pain, unspecified 04/28/2004   Closed fracture of five ribs 03/24/2004   First degree atrioventricular block 04/25/2007   Gross hematuria 09/06/2011   History of radiation therapy 04/03/14- 04/17/14   mid to distal left tibia 3000 cGy in 10  sessions   HTN (hypertension)    Hx of radiation therapy 11/1998   prostate fossa - 6040 cGy, 33 fx, Dr Danny Lawless   Osteoarthrosis involving, or with mention of more than one site, but not specified as generalized, multiple sites 10/22/2010   Other abnormal blood chemistry 04/29/1991   Other and unspecified hyperlipidemia 01/02/2013   Palpitations 04/28/2004   Prostate cancer ([place]) 12/16/1994   gleason 7   Reflux esophagitis 05/10/2008   Routine general medical examination at a health care facility    Spinal stenosis, unspecified region other than cervical  04/29/1995   Type II or unspecified type diabetes mellitus without mention of complication, uncontrolled    Past Surgical History:  Procedure Laterality Date   AIR/FLUID EXCHANGE Left 01/09/2016   Procedure: AIR/FLUID EXCHANGE;  Surgeon: Jalene Mullet, MD;  Location: [place];  Service: Ophthalmology;  Laterality: Left;   CYSTOSCOPY W/ URETERAL STENT PLACEMENT  12/31/14   CYSTOSCOPY W/ URETERAL STENT PLACEMENT  05/16/2015   EYE SURGERY  2006   cataract, Dr Shanon Rosser   LASER PHOTO ABLATION Left 01/09/2016   Procedure: LASER PHOTO ABLATION;  Surgeon: Jalene Mullet, MD;  Location: [place];  Service: Ophthalmology;  Laterality: Left;  Endolaser   Left ureteral stent placement  Week of 12/05/11   PARS PLANA VITRECTOMY Left 01/09/2016   Procedure: PARS PLANA VITRECTOMY WITH 25 GAUGE LEFT EYE ;  Surgeon: Jalene Mullet, MD;  Location: [place];  Service: Ophthalmology;  Laterality: Left;   PERFLUORONE INJECTION Left 01/09/2016   Procedure: PERFLUORONE INJECTION;  Surgeon: Jalene Mullet, MD;  Location: [place];  Service: Ophthalmology;  Laterality: Left;   PROSTATECTOMY  1996   Dr. Rosana Hoes   URETERAL STENT PLACEMENT  05/2020   [facility] every 3 months    URETERAL STENT PLACEMENT  09/23/2020   Patient gets replaced every 3 months    Family History  Problem Relation Age of Onset   Heart disease Father    Stroke Sister    Social History   Socioeconomic History   Marital status: Married    Spouse name: Not on file   Number of children: Not on file   Years of education: Not on file   Highest education level: Not on file  Occupational History   Not on file  Tobacco Use   Smoking status: Former    Types: Cigars    Quit date: 12/16/1968    Years since quitting: 52.8   Smokeless tobacco: Never  Vaping Use   Vaping Use: Never used  Substance and Sexual Activity   Alcohol use: No    Alcohol/week: 0.0 standard drinks   Drug use: No   Sexual activity: Never  Other Topics Concern   Not on file  Social  History Narrative   Not on file   Social Determinants of Health   Financial Resource Strain: Not on file  Food Insecurity: Not on file  Transportation Needs: Not on file  Physical Activity: Not on file  Stress: Not on file  Social Connections: Not on file    Tobacco Counseling Counseling given: Not Answered   Clinical Intake:  Pre-visit preparation completed: Yes  Pain : No/denies pain     BMI - recorded: 37 Nutritional Risks: None Diabetes: No  How often do you need to have someone help you when you read instructions, pamphlets, or other written materials from your doctor or pharmacy?: 1 - Never  Diabetic?no          Activities of Daily  Living In your present state of health, do you have any difficulty performing the following activities: 09/29/2021  Hearing? N  Vision? N  Difficulty concentrating or making decisions? N  Walking or climbing stairs? Y  Comment somewhat  Dressing or bathing? N  Doing errands, shopping? N  Preparing Food and eating ? N  Using the Toilet? N  In the past six months, have you accidently leaked urine? Y  Do you have problems with loss of bowel control? Y  Managing your Medications? N  Managing your Finances? N  Housekeeping or managing your Housekeeping? N  Some recent data might be hidden    Patient Care Team: Lauree Chandler, NP as PCP - General (Geriatric Medicine) Jerline Pain, MD as PCP - Cardiology (Cardiology) Myrlene Broker, MD as Attending Physician (Urology) [name], MD as Consulting Physician (Ophthalmology) [name], MD as Consulting Physician (Ophthalmology)  Indicate any recent Medical Services you may have received from other than [facility] providers in the past year (date may be approximate).     Assessment:   This is a routine wellness examination for Jeremiah Collins.  Hearing/Vision screen Hearing Screening - Comments:: Patient has no hearing problems Vision Screening - Comments:: Patient has  no vision problems. Last eye exam was done May 19 2021. Patient sees Dr. Delman Cheadle.   Dietary issues and exercise activities discussed: Current Exercise Habits: The patient does not participate in regular exercise at present   Goals Addressed   None    Depression Screen PHQ 2/9 Scores 09/29/2021 01/28/2021 09/24/2020 07/30/2020 09/24/2019 09/24/2019 06/22/2019  PHQ - 2 Score 0 0 0 0 0 0 0    Fall Risk Fall Risk  09/29/2021 08/07/2021 01/28/2021 09/24/2020 07/30/2020  Falls in the past year? 0 0 0 0 0  Number falls in past yr: 0 0 0 0 0  Injury with Fall? 0 0 0 0 0  Risk for fall due to : No Fall Risks No Fall Risks - - -  Follow up Falls evaluation completed Falls evaluation completed - - -    FALL RISK PREVENTION PERTAINING TO THE HOME:  Any stairs in or around the home? Yes  If so, are there any without handrails? No  Home free of loose throw rugs in walkways, pet beds, electrical cords, etc? No  Adequate lighting in your home to reduce risk of falls? Yes   ASSISTIVE DEVICES UTILIZED TO PREVENT FALLS:  Life alert? No  Use of a cane, walker or w/c? No  Grab bars in the bathroom? Yes  Shower chair or bench in shower? No  Elevated toilet seat or a handicapped toilet? No   TIMED UP AND GO:  Was the test performed? No .    Cognitive Function: MMSE - Mini Mental State Exam 09/24/2019 09/05/2017 09/03/2016 09/10/2014  Orientation to time 4 5 5 5   Orientation to Place 5 5 5 5   Registration 3 3 3 3   Attention/ Calculation 3 4 5 5   Recall 2 1 3 3   Language- name 2 objects 2 2 2 2   Language- repeat 1 1 1 1   Language- follow 3 step command 2 3 3 3   Language- read & follow direction 1 1 1 1   Write a sentence 1 1 0 1  Copy design 1 1 1 1   Total score 25 27 29 30      6CIT Screen 09/29/2021 09/24/2020  What Year? 0 points 0 points  What month? 0 points 0 points  What  time? 0 points 0 points  Count back from 20 0 points 0 points  Months in reverse 0 points 0 points  Repeat phrase 0  points 6 points  Total Score 0 6    Immunizations Immunization History  Administered Date(s) Administered   Fluad Quad(high Dose 65+) 07/30/2020, 08/07/2021   Influenza Whole 08/13/2010, 08/16/2011   Influenza, High Dose Seasonal PF 08/19/2016, 09/12/2018, 09/04/2019   Influenza, Quadrivalent, Recombinant, Inj, Pf 07/18/2013, 08/20/2015   Influenza,inj,Quad PF,6+ Mos 07/18/2013, 08/20/2015, 09/05/2017   Influenza-Unspecified 09/01/2016   PFIZER(Purple Top)SARS-COV-2 Vaccination 12/05/2019, 12/23/2019   Pneumococcal Conjugate-13 01/14/2015   Pneumococcal Polysaccharide-23 09/06/2011   Tdap 11/07/2013    TDAP status: Up to date  Flu Vaccine status: Up to date  Pneumococcal vaccine status: Up to date  Covid-19 vaccine status: Information provided on how to obtain vaccines.   Qualifies for Shingles Vaccine? Yes   Zostavax completed No   Shingrix Completed?: No.    Education has been provided regarding the importance of this vaccine. Patient has been advised to call insurance company to determine out of pocket expense if they have not yet received this vaccine. Advised may also receive vaccine at local pharmacy or Health Dept. Verbalized acceptance and understanding.  Screening Tests Health Maintenance  Topic Date Due   Zoster Vaccines- Shingrix (1 of 2) Never done   COVID-19 Vaccine (3 - Pfizer risk series) 01/20/2020   OPHTHALMOLOGY EXAM  11/18/2021   HEMOGLOBIN A1C  02/01/2022   FOOT EXAM  08/07/2022   TETANUS/TDAP  11/08/2023   Pneumonia Vaccine 55+ Years old  Completed   INFLUENZA VACCINE  Completed   HPV VACCINES  Aged Out    Health Maintenance  Health Maintenance Due  Topic Date Due   Zoster Vaccines- Shingrix (1 of 2) Never done   COVID-19 Vaccine (3 - Pfizer risk series) 01/20/2020    Colorectal cancer screening: No longer required.   Lung Cancer Screening: (Low Dose CT Chest recommended if Age 24-80 years, 30 pack-year currently smoking OR have quit w/in  15years.) does not qualify.   Lung Cancer Screening Referral: na  Additional Screening:  Hepatitis C Screening: does not qualify  Vision Screening: Recommended annual ophthalmology exams for early detection of glaucoma and other disorders of the eye. Is the patient up to date with their annual eye exam?  Yes  Who is the provider or what is the name of the office in which the patient attends annual eye exams? [facility] If pt is not established with a provider, would they like to be referred to a provider to establish care? No .   Dental Screening: Recommended annual dental exams for proper oral hygiene  Community Resource Referral / Chronic Care Management: CRR required this visit?  No   CCM required this visit?  No      Plan:     I have personally reviewed and noted the following in the patient's chart:   Medical and social history Use of alcohol, tobacco or illicit drugs  Current medications and supplements including opioid prescriptions. Patient is not currently taking opioid prescriptions. Functional ability and status Nutritional status Physical activity Advanced directives List of other physicians Hospitalizations, surgeries, and ER visits in previous 12 months Vitals Screenings to include cognitive, depression, and falls Referrals and appointments  In addition, I have reviewed and discussed with patient certain preventive protocols, quality metrics, and best practice recommendations. A written personalized care plan for preventive services as well as general preventive health recommendations were provided  to patient.     Lauree Chandler, NP   09/29/2021    Virtual Visit via Telephone Note  I connected withNAME@ on 09/29/21 at  9:00 AM EST by telephone and verified that I am speaking with the correct person using two identifiers.  Location: Patient: home Provider: [facility]    I discussed the limitations, risks, security and privacy concerns of performing an  evaluation and management service by telephone and the availability of in person appointments. I also discussed with the patient that there may be a patient responsible charge related to this service. The patient expressed understanding and agreed to proceed.   I discussed the assessment and treatment plan with the patient. The patient was provided an opportunity to ask questions and all were answered. The patient agreed with the plan and demonstrated an understanding of the instructions.   The patient was advised to call back or seek an in-person evaluation if the symptoms worsen or if the condition fails to improve as anticipated.  I provided 15 minutes of non-face-to-face time during this encounter.  [employer]. Harle Battiest [employer] printed and mailed

## 2021-09-29 NOTE — Patient Instructions (Signed)
[name] , Thank you for taking time to come for your Medicare Wellness Visit. I appreciate your ongoing commitment to your health goals. Please review the following plan we discussed and let me know if I can assist you in the future.   Screening recommendations/referrals: Colonoscopy aged out  Recommended yearly ophthalmology/optometry visit for glaucoma screening and checkup Recommended yearly dental visit for hygiene and checkup  Vaccinations: Influenza vaccine up to date Pneumococcal vaccine up to date  Tdap vaccine up to date Shingles vaccine RECOMMENDED at local pharmacy     Advanced directives: on file   Conditions/risks identified: obesity, advanced age  Next appointment: yearly for awv  Preventive Care 60 Years and Older, Male Preventive care refers to lifestyle choices and visits with your health care provider that can promote health and wellness. What does preventive care include? A yearly physical exam. This is also called an annual well check. Dental exams once or twice a year. Routine eye exams. Ask your health care provider how often you should have your eyes checked. Personal lifestyle choices, including: Daily care of your teeth and gums. Regular physical activity. Eating a healthy diet. Avoiding tobacco and drug use. Limiting alcohol use. Practicing safe sex. Taking low doses of aspirin every day. Taking vitamin and mineral supplements as recommended by your health care provider. What happens during an annual well check? The services and screenings done by your health care provider during your annual well check will depend on your age, overall health, lifestyle risk factors, and family history of disease. Counseling  Your health care provider may ask you questions about your: Alcohol use. Tobacco use. Drug use. Emotional well-being. Home and relationship well-being. Sexual activity. Eating habits. History of falls. Memory and ability to understand  (cognition). Work and work Statistician. Screening  You may have the following tests or measurements: Height, weight, and BMI. Blood pressure. Lipid and cholesterol levels. These may be checked every 5 years, or more frequently if you are over 66 years old. Skin check. Lung cancer screening. You may have this screening every year starting at age 42 if you have a 30-pack-year history of smoking and currently smoke or have quit within the past 15 years. Fecal occult blood test (FOBT) of the stool. You may have this test every year starting at age 45. Flexible sigmoidoscopy or colonoscopy. You may have a sigmoidoscopy every 5 years or a colonoscopy every 10 years starting at age 43. Prostate cancer screening. Recommendations will vary depending on your family history and other risks. Hepatitis C blood test. Hepatitis B blood test. Sexually transmitted disease (STD) testing. Diabetes screening. This is done by checking your blood sugar (glucose) after you have not eaten for a while (fasting). You may have this done every 1-3 years. Abdominal aortic aneurysm (AAA) screening. You may need this if you are a current or former smoker. Osteoporosis. You may be screened starting at age 76 if you are at high risk. Talk with your health care provider about your test results, treatment options, and if necessary, the need for more tests. Vaccines  Your health care provider may recommend certain vaccines, such as: Influenza vaccine. This is recommended every year. Tetanus, diphtheria, and acellular pertussis (Tdap, Td) vaccine. You may need a Td booster every 10 years. Zoster vaccine. You may need this after age 1. Pneumococcal 13-valent conjugate (PCV13) vaccine. One dose is recommended after age 26. Pneumococcal polysaccharide (PPSV23) vaccine. One dose is recommended after age 55. Talk to your health care  provider about which screenings and vaccines you need and how often you need them. This  information is not intended to replace advice given to you by your health care provider. Make sure you discuss any questions you have with your health care provider. Document Released: 11/28/2015 Document Revised: 07/21/2016 Document Reviewed: 09/02/2015 Elsevier Interactive Patient Education  2017 [place] Prevention in the Home Falls can cause injuries. They can happen to people of all ages. There are many things you can do to make your home safe and to help prevent falls. What can I do on the outside of my home? Regularly fix the edges of walkways and driveways and fix any cracks. Remove anything that might make you trip as you walk through a door, such as a raised step or threshold. Trim any bushes or trees on the path to your home. Use bright outdoor lighting. Clear any walking paths of anything that might make someone trip, such as rocks or tools. Regularly check to see if handrails are loose or broken. Make sure that both sides of any steps have handrails. Any raised decks and porches should have guardrails on the edges. Have any leaves, snow, or ice cleared regularly. Use sand or salt on walking paths during winter. Clean up any spills in your garage right away. This includes oil or grease spills. What can I do in the bathroom? Use night lights. Install grab bars by the toilet and in the tub and shower. Do not use towel bars as grab bars. Use non-skid mats or decals in the tub or shower. If you need to sit down in the shower, use a plastic, non-slip stool. Keep the floor dry. Clean up any water that spills on the floor as soon as it happens. Remove soap buildup in the tub or shower regularly. Attach bath mats securely with double-sided non-slip rug tape. Do not have throw rugs and other things on the floor that can make you trip. What can I do in the bedroom? Use night lights. Make sure that you have a light by your bed that is easy to reach. Do not use any sheets or  blankets that are too big for your bed. They should not hang down onto the floor. Have a firm chair that has side arms. You can use this for support while you get dressed. Do not have throw rugs and other things on the floor that can make you trip. What can I do in the kitchen? Clean up any spills right away. Avoid walking on wet floors. Keep items that you use a lot in easy-to-reach places. If you need to reach something above you, use a strong step stool that has a grab bar. Keep electrical cords out of the way. Do not use floor polish or wax that makes floors slippery. If you must use wax, use non-skid floor wax. Do not have throw rugs and other things on the floor that can make you trip. What can I do with my stairs? Do not leave any items on the stairs. Make sure that there are handrails on both sides of the stairs and use them. Fix handrails that are broken or loose. Make sure that handrails are as long as the stairways. Check any carpeting to make sure that it is firmly attached to the stairs. Fix any carpet that is loose or worn. Avoid having throw rugs at the top or bottom of the stairs. If you do have throw rugs, attach them to the  floor with carpet tape. Make sure that you have a light switch at the top of the stairs and the bottom of the stairs. If you do not have them, ask someone to add them for you. What else can I do to help prevent falls? Wear shoes that: Do not have high heels. Have rubber bottoms. Are comfortable and fit you well. Are closed at the toe. Do not wear sandals. If you use a stepladder: Make sure that it is fully opened. Do not climb a closed stepladder. Make sure that both sides of the stepladder are locked into place. Ask someone to hold it for you, if possible. Clearly mark and make sure that you can see: Any grab bars or handrails. First and last steps. Where the edge of each step is. Use tools that help you move around (mobility aids) if they are  needed. These include: Canes. Walkers. Scooters. Crutches. Turn on the lights when you go into a dark area. Replace any light bulbs as soon as they burn out. Set up your furniture so you have a clear path. Avoid moving your furniture around. If any of your floors are uneven, fix them. If there are any pets around you, be aware of where they are. Review your medicines with your doctor. Some medicines can make you feel dizzy. This can increase your chance of falling. Ask your doctor what other things that you can do to help prevent falls. This information is not intended to replace advice given to you by your health care provider. Make sure you discuss any questions you have with your health care provider. Document Released: 08/28/2009 Document Revised: 04/08/2016 Document Reviewed: 12/06/2014 Elsevier Interactive Patient Education  2017 [employer].

## 2021-09-29 NOTE — Telephone Encounter (Signed)
Jeremiah Collins, Jeremiah Collins are scheduled for a virtual visit with your provider today.    Just as we do with appointments in the office, we must obtain your consent to participate.  Your consent will be active for this visit and any virtual visit you may have with one of our providers in the next 365 days.    If you have a MyChart account, I can also send a copy of this consent to you electronically.  All virtual visits are billed to your insurance company just like a traditional visit in the office.  As this is a virtual visit, video technology does not allow for your provider to perform a traditional examination.  This may limit your provider's ability to fully assess your condition.  If your provider identifies any concerns that need to be evaluated in person or the need to arrange testing such as labs, EKG, etc, we will make arrangements to do so.    Although advances in technology are sophisticated, we cannot ensure that it will always work on either your end or our end.  If the connection with a video visit is poor, we may have to switch to a telephone visit.  With either a video or telephone visit, we are not always able to ensure that we have a secure connection.   I need to obtain your verbal consent now.   Are you willing to proceed with your visit today?   Jeremiah Collins has provided verbal consent on 09/29/2021 for a virtual visit (video or telephone).   Carroll Kinds, CMA 09/29/2021  9:06 AM

## 2021-10-23 ENCOUNTER — Ambulatory Visit (INDEPENDENT_AMBULATORY_CARE_PROVIDER_SITE_OTHER): Payer: Medicare Other | Admitting: Podiatry

## 2021-10-23 ENCOUNTER — Other Ambulatory Visit: Payer: Self-pay

## 2021-10-23 ENCOUNTER — Encounter: Payer: Self-pay | Admitting: Podiatry

## 2021-10-23 DIAGNOSIS — M79675 Pain in left toe(s): Secondary | ICD-10-CM | POA: Diagnosis not present

## 2021-10-23 DIAGNOSIS — B351 Tinea unguium: Secondary | ICD-10-CM | POA: Diagnosis not present

## 2021-10-23 DIAGNOSIS — M79674 Pain in right toe(s): Secondary | ICD-10-CM | POA: Diagnosis not present

## 2021-10-23 DIAGNOSIS — E0843 Diabetes mellitus due to underlying condition with diabetic autonomic (poly)neuropathy: Secondary | ICD-10-CM | POA: Diagnosis not present

## 2021-10-23 NOTE — Progress Notes (Signed)
This patient returns to my office for at risk foot care.  This patient requires this care by a professional since this patient will be at risk due to having diabetes with neuropathy.  This patient is unable to cut nails himself since the patient cannot reach his nails.These nails are painful walking and wearing shoes.  This patient presents for at risk foot care today.  General Appearance  Alert, conversant and in no acute stress.  Vascular  Dorsalis pedis and posterior tibial  pulses are palpable  bilaterally.  Capillary return is within normal limits  bilaterally. Temperature is within normal limits  bilaterally.  Neurologic  Senn-Weinstein monofilament wire test within normal limits  bilaterally. Muscle power within normal limits bilaterally.  Nails Thick disfigured discolored nails with subungual debris  from hallux to fifth toes bilaterally. No evidence of bacterial infection or drainage bilaterally.  Orthopedic  No limitations of motion  feet .  No crepitus or effusions noted.  No bony pathology or digital deformities noted.  Skin  normotropic skin with no porokeratosis noted bilaterally.  No signs of infections or ulcers noted.     Onychomycosis  Pain in right toes  Pain in left toes  Consent was obtained for treatment procedures.   Mechanical debridement of nails 1-5  bilaterally performed with a nail nipper.  Filed with dremel without incident.    Return office visit     3 months                Told patient to return for periodic foot care and evaluation due to potential at risk complications.   Elio Haden DPM  

## 2021-11-17 LAB — HM DIABETES EYE EXAM

## 2022-01-22 ENCOUNTER — Ambulatory Visit (INDEPENDENT_AMBULATORY_CARE_PROVIDER_SITE_OTHER): Payer: Medicare Other | Admitting: Podiatry

## 2022-01-22 ENCOUNTER — Encounter: Payer: Self-pay | Admitting: Podiatry

## 2022-01-22 ENCOUNTER — Other Ambulatory Visit: Payer: Self-pay

## 2022-01-22 DIAGNOSIS — E0843 Diabetes mellitus due to underlying condition with diabetic autonomic (poly)neuropathy: Secondary | ICD-10-CM

## 2022-01-22 DIAGNOSIS — B351 Tinea unguium: Secondary | ICD-10-CM

## 2022-01-22 DIAGNOSIS — M79675 Pain in left toe(s): Secondary | ICD-10-CM

## 2022-01-22 DIAGNOSIS — M79674 Pain in right toe(s): Secondary | ICD-10-CM

## 2022-01-22 NOTE — Progress Notes (Signed)
This patient returns to my office for at risk foot care.  This patient requires this care by a professional since this patient will be at risk due to having diabetes with neuropathy.  This patient is unable to cut nails himself since the patient cannot reach his nails.These nails are painful walking and wearing shoes.  This patient presents for at risk foot care today.  General Appearance  Alert, conversant and in no acute stress.  Vascular  Dorsalis pedis and posterior tibial  pulses are palpable  bilaterally.  Capillary return is within normal limits  bilaterally. Temperature is within normal limits  bilaterally.  Neurologic  Senn-Weinstein monofilament wire test within normal limits  bilaterally. Muscle power within normal limits bilaterally.  Nails Thick disfigured discolored nails with subungual debris  from hallux to fifth toes bilaterally. No evidence of bacterial infection or drainage bilaterally.  Orthopedic  No limitations of motion  feet .  No crepitus or effusions noted.  No bony pathology or digital deformities noted.  Skin  normotropic skin with no porokeratosis noted bilaterally.  No signs of infections or ulcers noted.     Onychomycosis  Pain in right toes  Pain in left toes  Consent was obtained for treatment procedures.   Mechanical debridement of nails 1-5  bilaterally performed with a nail nipper.  Filed with dremel without incident.    Return office visit     3 months                Told patient to return for periodic foot care and evaluation due to potential at risk complications.   Abby Stines DPM  

## 2022-02-01 ENCOUNTER — Other Ambulatory Visit: Payer: Medicare Other

## 2022-02-02 ENCOUNTER — Other Ambulatory Visit: Payer: Medicare Other

## 2022-02-02 ENCOUNTER — Other Ambulatory Visit: Payer: Self-pay

## 2022-02-02 DIAGNOSIS — I10 Essential (primary) hypertension: Secondary | ICD-10-CM

## 2022-02-02 DIAGNOSIS — E1142 Type 2 diabetes mellitus with diabetic polyneuropathy: Secondary | ICD-10-CM

## 2022-02-02 DIAGNOSIS — M1A9XX0 Chronic gout, unspecified, without tophus (tophi): Secondary | ICD-10-CM

## 2022-02-03 LAB — CBC WITH DIFFERENTIAL/PLATELET
Absolute Monocytes: 395 cells/uL (ref 200–950)
Basophils Absolute: 63 cells/uL (ref 0–200)
Basophils Relative: 0.8 %
Eosinophils Absolute: 158 cells/uL (ref 15–500)
Eosinophils Relative: 2 %
HCT: 31.8 % — ABNORMAL LOW (ref 38.5–50.0)
Hemoglobin: 10.4 g/dL — ABNORMAL LOW (ref 13.2–17.1)
Lymphs Abs: 1967 cells/uL (ref 850–3900)
MCH: 27.4 pg (ref 27.0–33.0)
MCHC: 32.7 g/dL (ref 32.0–36.0)
MCV: 83.7 fL (ref 80.0–100.0)
MPV: 9.6 fL (ref 7.5–12.5)
Monocytes Relative: 5 %
Neutro Abs: 5317 cells/uL (ref 1500–7800)
Neutrophils Relative %: 67.3 %
Platelets: 174 10*3/uL (ref 140–400)
RBC: 3.8 10*6/uL — ABNORMAL LOW (ref 4.20–5.80)
RDW: 15.2 % — ABNORMAL HIGH (ref 11.0–15.0)
Total Lymphocyte: 24.9 %
WBC: 7.9 10*3/uL (ref 3.8–10.8)

## 2022-02-03 LAB — HEMOGLOBIN A1C
Hgb A1c MFr Bld: 6.4 % of total Hgb — ABNORMAL HIGH (ref <5.7)
Mean Plasma Glucose: 137 mg/dL
eAG (mmol/L): 7.6 mmol/L

## 2022-02-03 LAB — COMPLETE METABOLIC PANEL WITH GFR
AG Ratio: 1.5 (calc) (ref 1.0–2.5)
ALT: 24 U/L (ref 9–46)
AST: 25 U/L (ref 10–35)
Albumin: 3.5 g/dL — ABNORMAL LOW (ref 3.6–5.1)
Alkaline phosphatase (APISO): 70 U/L (ref 35–144)
BUN/Creatinine Ratio: 18 (calc) (ref 6–22)
BUN: 28 mg/dL — ABNORMAL HIGH (ref 7–25)
CO2: 27 mmol/L (ref 20–32)
Calcium: 8.7 mg/dL (ref 8.6–10.3)
Chloride: 107 mmol/L (ref 98–110)
Creat: 1.53 mg/dL — ABNORMAL HIGH (ref 0.70–1.22)
Globulin: 2.4 g/dL (calc) (ref 1.9–3.7)
Glucose, Bld: 131 mg/dL (ref 65–139)
Potassium: 4 mmol/L (ref 3.5–5.3)
Sodium: 142 mmol/L (ref 135–146)
Total Bilirubin: 0.4 mg/dL (ref 0.2–1.2)
Total Protein: 5.9 g/dL — ABNORMAL LOW (ref 6.1–8.1)
eGFR: 44 mL/min/{1.73_m2} — ABNORMAL LOW (ref >=60)

## 2022-02-03 LAB — URIC ACID: Uric Acid, Serum: 4.8 mg/dL (ref 4.0–8.0)

## 2022-02-05 ENCOUNTER — Other Ambulatory Visit: Payer: Self-pay

## 2022-02-05 ENCOUNTER — Encounter: Payer: Self-pay | Admitting: Nurse Practitioner

## 2022-02-05 ENCOUNTER — Ambulatory Visit: Payer: Medicare Other | Admitting: Nurse Practitioner

## 2022-02-05 VITALS — BP 132/80 | HR 62 | Temp 97.5°F | Ht 71.0 in | Wt 270.0 lb

## 2022-02-05 DIAGNOSIS — I700 Atherosclerosis of aorta: Secondary | ICD-10-CM | POA: Diagnosis not present

## 2022-02-05 DIAGNOSIS — I10 Essential (primary) hypertension: Secondary | ICD-10-CM | POA: Diagnosis not present

## 2022-02-05 DIAGNOSIS — I420 Dilated cardiomyopathy: Secondary | ICD-10-CM

## 2022-02-05 DIAGNOSIS — E669 Obesity, unspecified: Secondary | ICD-10-CM

## 2022-02-05 DIAGNOSIS — N182 Chronic kidney disease, stage 2 (mild): Secondary | ICD-10-CM

## 2022-02-05 DIAGNOSIS — N131 Hydronephrosis with ureteral stricture, not elsewhere classified: Secondary | ICD-10-CM

## 2022-02-05 DIAGNOSIS — C61 Malignant neoplasm of prostate: Secondary | ICD-10-CM

## 2022-02-05 DIAGNOSIS — E1142 Type 2 diabetes mellitus with diabetic polyneuropathy: Secondary | ICD-10-CM

## 2022-02-05 DIAGNOSIS — M1990 Unspecified osteoarthritis, unspecified site: Secondary | ICD-10-CM

## 2022-02-05 DIAGNOSIS — E1169 Type 2 diabetes mellitus with other specified complication: Secondary | ICD-10-CM | POA: Diagnosis not present

## 2022-02-05 DIAGNOSIS — E1122 Type 2 diabetes mellitus with diabetic chronic kidney disease: Secondary | ICD-10-CM

## 2022-02-05 DIAGNOSIS — C7951 Secondary malignant neoplasm of bone: Secondary | ICD-10-CM

## 2022-02-05 NOTE — Patient Instructions (Signed)
Cut back on sweets- only have something sweet occasionally- not daily  ? ?We will see you back in 4 months to recheck this ? ?Also make sure you are drinking plenty of water- decrease diet drinks.  ?

## 2022-02-05 NOTE — Progress Notes (Signed)
? ? ?Careteam: ?Patient Care Team: ?Lauree Chandler, NP as PCP - General (Geriatric Medicine) ?Jerline Pain, MD as PCP - Cardiology (Cardiology) ?Myrlene Broker, MD as Attending Physician (Urology) ?[name], MD as Consulting Physician (Ophthalmology) ?[name], MD as Consulting Physician (Ophthalmology) ? ?PLACE OF SERVICE:  ?[facility]  ?Advanced Directive information ?Does Patient Have a Medical Advance Directive?: Yes, Type of Advance Directive: Out of facility DNR (pink MOST or yellow form), Pre-existing out of facility DNR order (yellow form or pink MOST form): Pink MOST form placed in chart (order not valid for inpatient use), Does patient want to make changes to medical advance directive?: No - Patient declined ? ?Allergies  ?Allergen Reactions  ? Abiraterone Nausea And Vomiting and Other (See Comments)  ?  Other reaction(s): Increased Heart Rate (intolerance) ?  ? ? ?Chief Complaint  ?Patient presents with  ? Medical Management of Chronic Issues  ?  6 month follow-up and discuss labs (copy printed and given to patient). Discuss need for shingrix and additional covid boosters or post pone if patient refuses. Patient denies receiving any vaccines since last visit. NCIR verified.   ? ? ? ?HPI: Patient is a 88 y.o. male for routine follow up. ? ?Has seen oncologist and urologist due to prostate cancer with bone mets. Reports he feels like he has been stable. Does not feel much different. No bone pain, some chronic arthritis.  ? ?Gout- no recent flares. Uric levels at goal 4.8 ? ?DM- A1c up. Reports he does not eat 3 meals a day. Eats something sweet after every meal.  ? ?CKD- Cr slightly worse on labs- he is drinking a lot of diet drinks ? ?Anemia- on supplement  ? ?Reports he is active most he day. Keeps busy. "Cant not do what I used to" ? ?Review of Systems:  ?Review of Systems  ?Constitutional:  Negative for chills, fever and weight loss.  ?HENT:  Negative for tinnitus.   ?Respiratory:   Negative for cough, sputum production and shortness of breath.   ?Cardiovascular:  Negative for chest pain, palpitations and leg swelling.  ?Gastrointestinal:  Negative for abdominal pain, constipation, diarrhea and heartburn.  ?Genitourinary:  Negative for dysuria, frequency and urgency.  ?Musculoskeletal:  Positive for joint pain. Negative for back pain, falls and myalgias.  ?Skin: Negative.   ?Neurological:  Negative for dizziness and headaches.  ?Psychiatric/Behavioral:  Negative for depression and memory loss. The patient does not have insomnia.   ? ?Past Medical History:  ?Diagnosis Date  ? Acute bronchitis 09/12/2012  ? Blepharochalasis 10/28/2006  ? Bone metastases ([place])   ? left mid tibial shaft  ? Cataract   ? Dr.Groat  ? Cellulitis and abscess of leg, except foot 01/17/2012  ? Chest pain, unspecified 04/28/2004  ? Closed fracture of five ribs 03/24/2004  ? First degree atrioventricular block 04/25/2007  ? Gross hematuria 09/06/2011  ? History of radiation therapy 04/03/14- 04/17/14  ? mid to distal left tibia 3000 cGy in 10 sessions  ? HTN (hypertension)   ? Hx of radiation therapy 11/1998  ? prostate fossa - 6040 cGy, 33 fx, Dr Danny Lawless  ? Osteoarthrosis involving, or with mention of more than one site, but not specified as generalized, multiple sites 10/22/2010  ? Other abnormal blood chemistry 04/29/1991  ? Other and unspecified hyperlipidemia 01/02/2013  ? Palpitations 04/28/2004  ? Prostate cancer ([place]) 12/16/1994  ? gleason 7  ? Reflux esophagitis 05/10/2008  ? Routine general medical examination  at a health care facility   ? Spinal stenosis, unspecified region other than cervical 04/29/1995  ? Type II or unspecified type diabetes mellitus without mention of complication, uncontrolled   ? ?Past Surgical History:  ?Procedure Laterality Date  ? AIR/FLUID EXCHANGE Left 01/09/2016  ? Procedure: AIR/FLUID EXCHANGE;  Surgeon: Jalene Mullet, MD;  Location: [place];  Service: Ophthalmology;  Laterality: Left;  ?  CYSTOSCOPY W/ URETERAL STENT PLACEMENT  12/31/14  ? CYSTOSCOPY W/ URETERAL STENT PLACEMENT  05/16/2015  ? EYE SURGERY  2006  ? cataract, Dr Shanon Rosser  ? LASER PHOTO ABLATION Left 01/09/2016  ? Procedure: LASER PHOTO ABLATION;  Surgeon: Jalene Mullet, MD;  Location: [place];  Service: Ophthalmology;  Laterality: Left;  Endolaser  ? Left ureteral stent placement  Week of 12/05/11  ? PARS PLANA VITRECTOMY Left 01/09/2016  ? Procedure: PARS PLANA VITRECTOMY WITH 25 GAUGE LEFT EYE ;  Surgeon: Jalene Mullet, MD;  Location: [place];  Service: Ophthalmology;  Laterality: Left;  ? PERFLUORONE INJECTION Left 01/09/2016  ? Procedure: PERFLUORONE INJECTION;  Surgeon: Jalene Mullet, MD;  Location: [place];  Service: Ophthalmology;  Laterality: Left;  ? PROSTATECTOMY  1996  ? Dr. Rosana Hoes  ? URETERAL STENT PLACEMENT  05/2020  ? [facility] every 3 months   ? URETERAL STENT PLACEMENT  09/23/2020  ? Patient gets replaced every 3 months   ? ?Social History: ?  reports that he quit smoking about 53 years ago. His smoking use included cigars. He has never used smokeless tobacco. He reports that he does not drink alcohol and does not use drugs. ? ?Family History  ?Problem Relation Age of Onset  ? Heart disease Father   ? Stroke Sister   ? ? ?Medications: ?Patient's Medications  ?New Prescriptions  ? No medications on file  ?Previous Medications  ? ACETAMINOPHEN (TYLENOL) 650 MG CR TABLET    Take 650 mg by mouth as needed for pain.  ? ALLOPURINOL (ZYLOPRIM) 100 MG TABLET    TAKE 2 TABLETS BY MOUTH EVERY DAY  ? ASPIRIN 81 MG TABLET    Take 1 tablet (81 mg total) by mouth daily.  ? CALCIUM CARB-CHOLECALCIFEROL 600-5 MG-MCG TABS    Take 2 tablets by mouth daily.  ? CALCIUM CARBONATE (TUMS - DOSED IN MG ELEMENTAL CALCIUM) 500 MG CHEWABLE TABLET    Chew 1 tablet by mouth as needed for indigestion or heartburn.  ? CHOLECALCIFEROL 1000 UNITS TABLET    Take 1,000 Units by mouth daily.  ? DOXAZOSIN (CARDURA) 4 MG TABLET    TAKE 1 TABLET BY MOUTH EVERY DAY  ?  FERROUS SULFATE 325 (65 FE) MG EC TABLET    Take 325 mg by mouth every other day.  ? GLUCOSE BLOOD (ONETOUCH VERIO) TEST STRIP    Check blood sugar one to two times a week. Dx:E11.29  ? LEUPROLIDE, 6 MONTH, (ELIGARD) 45 MG INJECTION    Inject 45 mg into the skin every 6 (six) months. [facility]  ? LISINOPRIL-HYDROCHLOROTHIAZIDE (ZESTORETIC) 20-12.5 MG TABLET    TAKE 2 TABLETS BY MOUTH EVERY MORNING  ? METOPROLOL TARTRATE (LOPRESSOR) 50 MG TABLET    TAKE 1 TABLET BY MOUTH TWICE A DAY  ? OMEGA-3 FATTY ACIDS (FISH OIL) 1000 MG CAPS    Take 1 capsule by mouth daily.  ? PYRIDOXINE (B-6) 100 MG TABLET    Take 100 mg by mouth daily.  ? ROSUVASTATIN (CRESTOR) 20 MG TABLET    TAKE 1 TABLET (20 MG  TOTAL) BY MOUTH DAILY. MUST KEEP UPCOMING APPT FOR FURTHER REFILLS.  ?Modified Medications  ? No medications on file  ?Discontinued Medications  ? No medications on file  ? ? ?Physical Exam: ? ?Vitals:  ? 02/05/22 0923  ?BP: 132/80  ?Pulse: 62  ?Temp: (!) 97.5 ?F (36.4 ?C)  ?TempSrc: Temporal  ?SpO2: 96%  ?Weight: 270 lb (122.5 kg)  ?Height: '5\' 11"'$  (1.803 m)  ? ?Body mass index is 37.66 kg/m?. ?Wt Readings from Last 3 Encounters:  ?02/05/22 270 lb (122.5 kg)  ?08/24/21 269 lb (122 kg)  ?08/07/21 267 lb (121.1 kg)  ? ? ?Physical Exam ?Constitutional:   ?   General: He is not in acute distress. ?   Appearance: He is well-developed. He is not diaphoretic.  ?HENT:  ?   Head: Normocephalic and atraumatic.  ?   Right Ear: External ear normal.  ?   Left Ear: External ear normal.  ?   Mouth/Throat:  ?   Pharynx: No oropharyngeal exudate.  ?Eyes:  ?   Conjunctiva/sclera: Conjunctivae normal.  ?   Pupils: Pupils are equal, round, and reactive to light.  ?Cardiovascular:  ?   Rate and Rhythm: Normal rate and regular rhythm.  ?   Heart sounds: Normal heart sounds.  ?Pulmonary:  ?   Effort: Pulmonary effort is normal.  ?   Breath sounds: Normal breath sounds.  ?Abdominal:  ?   General: Bowel sounds are normal.  ?   Palpations:  Abdomen is soft.  ?Musculoskeletal:     ?   General: No tenderness.  ?   Cervical back: Normal range of motion and neck supple.  ?   Right lower leg: No edema.  ?   Left lower leg: No edema.  ?Skin: ?   Gen

## 2022-02-06 ENCOUNTER — Other Ambulatory Visit: Payer: Self-pay | Admitting: Nurse Practitioner

## 2022-02-06 DIAGNOSIS — M109 Gout, unspecified: Secondary | ICD-10-CM

## 2022-02-06 DIAGNOSIS — I10 Essential (primary) hypertension: Secondary | ICD-10-CM

## 2022-03-25 LAB — COMPREHENSIVE METABOLIC PANEL
Albumin: 3.5 (ref 3.5–5.0)
Calcium: 8.7 (ref 8.7–10.7)
eGFR: 38

## 2022-03-25 LAB — CBC AND DIFFERENTIAL
HCT: 29 — AB (ref 41–53)
Hemoglobin: 10 — AB (ref 13.5–17.5)
Neutrophils Absolute: 6.1
Platelets: 158 10*3/uL (ref 150–400)
WBC: 8.3

## 2022-03-25 LAB — BASIC METABOLIC PANEL
BUN: 39 — AB (ref 4–21)
CO2: 25 — AB (ref 13–22)
Chloride: 106 (ref 99–108)
Creatinine: 1.8 — AB (ref 0.6–1.3)
Glucose: 134
Potassium: 3.9 mEq/L (ref 3.5–5.1)
Sodium: 139 (ref 137–147)

## 2022-03-25 LAB — IRON,TIBC AND FERRITIN PANEL
Ferritin: 89
Iron: 26
TIBC: 266
UIBC: 208

## 2022-03-25 LAB — HEPATIC FUNCTION PANEL
ALT: 13 U/L (ref 10–40)
AST: 15 (ref 14–40)
Alkaline Phosphatase: 64 (ref 25–125)
Bilirubin, Total: 0.4

## 2022-03-25 LAB — PSA: PSA: 0.01

## 2022-03-25 LAB — CBC: RBC: 3.5 — AB (ref 3.87–5.11)

## 2022-04-26 ENCOUNTER — Encounter: Payer: Self-pay | Admitting: Podiatry

## 2022-04-26 ENCOUNTER — Ambulatory Visit (INDEPENDENT_AMBULATORY_CARE_PROVIDER_SITE_OTHER): Payer: Medicare Other | Admitting: Podiatry

## 2022-04-26 DIAGNOSIS — M79675 Pain in left toe(s): Secondary | ICD-10-CM

## 2022-04-26 DIAGNOSIS — M79674 Pain in right toe(s): Secondary | ICD-10-CM | POA: Diagnosis not present

## 2022-04-26 DIAGNOSIS — E1142 Type 2 diabetes mellitus with diabetic polyneuropathy: Secondary | ICD-10-CM | POA: Diagnosis not present

## 2022-04-26 DIAGNOSIS — E0843 Diabetes mellitus due to underlying condition with diabetic autonomic (poly)neuropathy: Secondary | ICD-10-CM

## 2022-04-26 DIAGNOSIS — B351 Tinea unguium: Secondary | ICD-10-CM | POA: Diagnosis not present

## 2022-04-26 NOTE — Progress Notes (Signed)
This patient returns to my office for at risk foot care.  This patient requires this care by a professional since this patient will be at risk due to having diabetes with neuropathy.  This patient is unable to cut nails himself since the patient cannot reach his nails.These nails are painful walking and wearing shoes.  This patient presents for at risk foot care today.  General Appearance  Alert, conversant and in no acute stress.  Vascular  Dorsalis pedis and posterior tibial  pulses are palpable  bilaterally.  Capillary return is within normal limits  bilaterally. Temperature is within normal limits  bilaterally.  Neurologic  Senn-Weinstein monofilament wire test within normal limits  bilaterally. Muscle power within normal limits bilaterally.  Nails Thick disfigured discolored nails with subungual debris  from hallux to fifth toes bilaterally. No evidence of bacterial infection or drainage bilaterally.  Orthopedic  No limitations of motion  feet .  No crepitus or effusions noted.  No bony pathology or digital deformities noted.  Skin  normotropic skin with no porokeratosis noted bilaterally.  No signs of infections or ulcers noted.     Onychomycosis  Pain in right toes  Pain in left toes  Consent was obtained for treatment procedures.   Mechanical debridement of nails 1-5  bilaterally performed with a nail nipper.  Filed with dremel without incident.    Return office visit     3 months                Told patient to return for periodic foot care and evaluation due to potential at risk complications.   Chaise Mahabir DPM  

## 2022-05-20 ENCOUNTER — Other Ambulatory Visit: Payer: Self-pay | Admitting: Nurse Practitioner

## 2022-05-20 DIAGNOSIS — I517 Cardiomegaly: Secondary | ICD-10-CM

## 2022-05-20 DIAGNOSIS — I10 Essential (primary) hypertension: Secondary | ICD-10-CM

## 2022-06-03 ENCOUNTER — Other Ambulatory Visit: Payer: Self-pay

## 2022-06-03 ENCOUNTER — Other Ambulatory Visit: Payer: Medicare Other

## 2022-06-03 DIAGNOSIS — E1122 Type 2 diabetes mellitus with diabetic chronic kidney disease: Secondary | ICD-10-CM

## 2022-06-04 LAB — COMPLETE METABOLIC PANEL WITH GFR
AG Ratio: 1.8 (calc) (ref 1.0–2.5)
ALT: 11 U/L (ref 9–46)
AST: 13 U/L (ref 10–35)
Albumin: 3.8 g/dL (ref 3.6–5.1)
Alkaline phosphatase (APISO): 66 U/L (ref 35–144)
BUN/Creatinine Ratio: 23 (calc) — ABNORMAL HIGH (ref 6–22)
BUN: 45 mg/dL — ABNORMAL HIGH (ref 7–25)
CO2: 24 mmol/L (ref 20–32)
Calcium: 8.5 mg/dL — ABNORMAL LOW (ref 8.6–10.3)
Chloride: 109 mmol/L (ref 98–110)
Creat: 1.96 mg/dL — ABNORMAL HIGH (ref 0.70–1.22)
Globulin: 2.1 g/dL (calc) (ref 1.9–3.7)
Glucose, Bld: 136 mg/dL — ABNORMAL HIGH (ref 65–99)
Potassium: 4.6 mmol/L (ref 3.5–5.3)
Sodium: 141 mmol/L (ref 135–146)
Total Bilirubin: 0.3 mg/dL (ref 0.2–1.2)
Total Protein: 5.9 g/dL — ABNORMAL LOW (ref 6.1–8.1)
eGFR: 33 mL/min/{1.73_m2} — ABNORMAL LOW (ref >=60)

## 2022-06-04 LAB — HEMOGLOBIN A1C
Hgb A1c MFr Bld: 6 % of total Hgb — ABNORMAL HIGH (ref <5.7)
Mean Plasma Glucose: 126 mg/dL
eAG (mmol/L): 7 mmol/L

## 2022-06-07 ENCOUNTER — Other Ambulatory Visit: Payer: Medicare Other

## 2022-06-10 DIAGNOSIS — M109 Gout, unspecified: Secondary | ICD-10-CM | POA: Insufficient documentation

## 2022-06-10 NOTE — Progress Notes (Unsigned)
Careteam: Patient Care Team: Jeremiah Chandler, NP as PCP - General (Geriatric Medicine) Jeremiah Pain, MD as PCP - Cardiology (Cardiology) Jeremiah Broker, MD as Attending Physician (Urology) Jeremiah Jacks, MD as Consulting Physician (Ophthalmology) Jeremiah Peach, MD as Consulting Physician (Ophthalmology)  PLACE OF SERVICE:  [place] Directive information Does Patient Have a Medical Advance Directive?: Yes, Type of Advance Directive: Out of facility DNR (pink MOST or yellow form), Pre-existing out of facility DNR order (yellow form or pink MOST form): Pink MOST form placed in chart (order not valid for inpatient use), Does patient want to make changes to medical advance directive?: No - Patient declined  Allergies  Allergen Reactions   Abiraterone Nausea And Vomiting and Other (See Comments)    Other reaction(s): Increased Heart Rate (intolerance)     Chief Complaint  Patient presents with   Medical Management of Chronic Issues    4 month follow-up. Discuss need for shingrix and additional covid boosters or post pone if patient refuses. Patient denies receiving any vaccines since last visit. NCIR verified. Takes tylenol and tums very infrequently (2-3 months ago)      HPI: Patient is a 88 y.o. male for routine follow up.   Does not drink a lot of water- stopped diet drinks.   Continues to follow up with urologist for left stent change every 3 months. Reports there has been no issues with this. Hx of hydronephrosis.   Htn- controlled.   Iron def anemia- continues on iron supplement. Added by oncologist.   Aortic stenosis- reports shortness of breath with activity, but this is unchanged and no worsening of symptoms. No chest pains.   Review of Systems:  ROS***  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/28/2006   Bone metastases    left mid tibial shaft   Cataract    Dr.Groat   Cellulitis and  abscess of leg, except foot 01/17/2012   Chest Collins, unspecified 04/28/2004   Closed fracture of five ribs 03/24/2004   First degree atrioventricular block 04/25/2007   Gross hematuria 09/06/2011   History of radiation therapy 04/03/14- 04/17/14   mid to distal left tibia 3000 cGy in 10 sessions   HTN (hypertension)    Hx of radiation therapy 11/1998   prostate fossa - 6040 cGy, 33 fx, Dr Jeremiah Collins   Osteoarthrosis involving, or with mention of more than one site, but not specified as generalized, multiple sites 10/22/2010   Other abnormal blood chemistry 04/29/1991   Other and unspecified hyperlipidemia 01/02/2013   Palpitations 04/28/2004   Prostate cancer ([place]) 12/16/1994   gleason 7   Reflux esophagitis 05/10/2008   Routine general medical examination at a health care facility    Spinal stenosis, unspecified region other than cervical 04/29/1995   Type II or unspecified type diabetes mellitus without mention of complication, uncontrolled    Past Surgical History:  Procedure Laterality Date   AIR/FLUID EXCHANGE Left 01/09/2016   Procedure: AIR/FLUID EXCHANGE;  Surgeon: Jeremiah Mullet, MD;  Location: [place];  Service: Ophthalmology;  Laterality: Left;   CYSTOSCOPY W/ URETERAL STENT PLACEMENT  12/31/14   CYSTOSCOPY W/ URETERAL STENT PLACEMENT  05/16/2015   EYE SURGERY  2006   cataract, Dr Jeremiah Collins   LASER PHOTO ABLATION Left 01/09/2016   Procedure: LASER PHOTO ABLATION;  Surgeon: Jeremiah Mullet, MD;  Location: [place];  Service: Ophthalmology;  Laterality: Left;  Endolaser   Left ureteral stent  placement  Week of 12/05/11   PARS PLANA VITRECTOMY Left 01/09/2016   Procedure: PARS PLANA VITRECTOMY WITH 25 GAUGE LEFT EYE ;  Surgeon: Jeremiah Mullet, MD;  Location: [place];  Service: Ophthalmology;  Laterality: Left;   PERFLUORONE INJECTION Left 01/09/2016   Procedure: PERFLUORONE INJECTION;  Surgeon: Jeremiah Mullet, MD;  Location: [place];  Service: Ophthalmology;  Laterality: Left;   PROSTATECTOMY  1996    Dr. Rosana Collins   URETERAL STENT PLACEMENT  05/2020   [facility] every 3 months    URETERAL STENT PLACEMENT  09/23/2020   Patient gets replaced every 3 months    Social History:   reports that he quit smoking about 53 years ago. His smoking use included cigars. He has never used smokeless tobacco. He reports that he does not drink alcohol and does not use drugs.  Family History  Problem Relation Age of Onset   Heart disease Father    Stroke Sister     Medications: Patient's Medications  New Prescriptions   No medications on file  Previous Medications   ACETAMINOPHEN (TYLENOL) 650 MG CR TABLET    Take 650 mg by mouth as needed for Collins.   ALLOPURINOL (ZYLOPRIM) 100 MG TABLET    TAKE 2 TABLETS BY MOUTH EVERY DAY   ASPIRIN 81 MG TABLET    Take 1 tablet (81 mg total) by mouth daily.   CALCIUM CARB-CHOLECALCIFEROL 600-5 MG-MCG TABS    Take 2 tablets by mouth daily.   CALCIUM CARBONATE (TUMS - DOSED IN MG ELEMENTAL CALCIUM) 500 MG CHEWABLE TABLET    Chew 1 tablet by mouth as needed for indigestion or heartburn.   CHOLECALCIFEROL 1000 UNITS TABLET    Take 1,000 Units by mouth daily.   DOXAZOSIN (CARDURA) 4 MG TABLET    TAKE 1 TABLET BY MOUTH EVERY DAY   FERROUS SULFATE 325 (65 FE) MG EC TABLET    Take 325 mg by mouth every other day.   GLUCOSE BLOOD (ONETOUCH VERIO) TEST STRIP    Check blood sugar one to two times a week. Dx:E11.29   LEUPROLIDE, 6 MONTH, (ELIGARD) 45 MG INJECTION    Inject 45 mg into the skin every 6 (six) months. Filled by [facility] (ZESTORETIC) 20-12.5 MG TABLET    TAKE 2 TABLETS BY MOUTH EVERY MORNING   METOPROLOL TARTRATE (LOPRESSOR) 50 MG TABLET    TAKE 1 TABLET BY MOUTH TWICE A DAY   OMEGA-3 FATTY ACIDS (FISH OIL) 1000 MG CAPS    Take 1 capsule by mouth daily.   PYRIDOXINE (B-6) 100 MG TABLET    Take 100 mg by mouth daily.   ROSUVASTATIN (CRESTOR) 20 MG TABLET    TAKE 1 TABLET (20 MG TOTAL) BY MOUTH DAILY. MUST KEEP UPCOMING APPT FOR  FURTHER REFILLS.  Modified Medications   No medications on file  Discontinued Medications   No medications on file    Physical Exam:  Vitals:   06/11/22 0921  BP: 138/72  Pulse: (!) 58  Temp: (!) 97.1 F (36.2 C)  TempSrc: Temporal  SpO2: 95%  Weight: 269 lb 6.4 oz (122.2 kg)  Height: '5\' 11"'$  (1.803 m)   Body mass index is 37.57 kg/m. Wt Readings from Last 3 Encounters:  06/11/22 269 lb 6.4 oz (122.2 kg)  02/05/22 270 lb (122.5 kg)  08/24/21 269 lb (122 kg)    Physical Exam***  Labs reviewed: Basic Metabolic Panel: Recent Labs    08/04/21 0812 02/02/22 0816 06/03/22 4008  NA 140 142 141  K 4.1 4.0 4.6  CL 106 107 109  CO2 '25 27 24  '$ GLUCOSE 125* 131 136*  BUN 26* 28* 45*  CREATININE 1.40* 1.53* 1.96*  CALCIUM 9.3 8.7 8.5*   Liver Function Tests: Recent Labs    08/04/21 0812 02/02/22 0816 06/03/22 0807  AST '14 25 13  '$ ALT '13 24 11  '$ BILITOT 0.5 0.4 0.3  PROT 5.9* 5.9* 5.9*   No results for input(s): "LIPASE", "AMYLASE" in the last 8760 hours. No results for input(s): "AMMONIA" in the last 8760 hours. CBC: Recent Labs    08/04/21 0812 02/02/22 0816  WBC 7.7 7.9  NEUTROABS 5,251 5,317  HGB 10.8* 10.4*  HCT 33.6* 31.8*  MCV 88.9 83.7  PLT 158 174   Lipid Panel: Recent Labs    08/04/21 0812  CHOL 95  HDL 37*  LDLCALC 40  TRIG 94  CHOLHDL 2.6   TSH: No results for input(s): "TSH" in the last 8760 hours. A1C: Lab Results  Component Value Date   HGBA1C 6.0 (H) 06/03/2022     Assessment/Plan 1. Essential hypertension, benign ***  2. Congestive dilated cardiomyopathy (HCC) ***  3. DM type 2 with diabetic peripheral neuropathy (HCC) ***  4. Controlled type 2 diabetes mellitus with stage 2 chronic kidney disease, without long-term current use of insulin (HCC) ***  5. Secondary malignant neoplasm of bone and bone marrow ([place]) -stable followed by oncologist routinely.   6. Osteoarthritis, unspecified osteoarthritis type,  unspecified site ***  7. Prostate cancer (HCC) ***  8. Mixed hyperlipidemia ***  9. Class 2 severe obesity due to excess calories with serious comorbidity and body mass index (BMI) of 37.0 to 37.9 in adult [facility]) --education provided on healthy weight loss through increase in physical activity and proper nutrition   10. Vitamin D deficiency -continues on supplement.   11. Gout, unspecified cause, unspecified chronicity, unspecified site No recent flares, continues on allopurinol    Return in about 4 months (around 10/12/2022).: *** Jeremiah Collins K. [place], [facility] [phone]

## 2022-06-11 ENCOUNTER — Encounter: Payer: Self-pay | Admitting: Nurse Practitioner

## 2022-06-11 ENCOUNTER — Ambulatory Visit: Payer: Medicare Other | Admitting: Nurse Practitioner

## 2022-06-11 VITALS — BP 138/72 | HR 58 | Temp 97.1°F | Ht 71.0 in | Wt 269.4 lb

## 2022-06-11 DIAGNOSIS — I10 Essential (primary) hypertension: Secondary | ICD-10-CM

## 2022-06-11 DIAGNOSIS — M1990 Unspecified osteoarthritis, unspecified site: Secondary | ICD-10-CM

## 2022-06-11 DIAGNOSIS — C7952 Secondary malignant neoplasm of bone marrow: Secondary | ICD-10-CM

## 2022-06-11 DIAGNOSIS — C7951 Secondary malignant neoplasm of bone: Secondary | ICD-10-CM | POA: Diagnosis not present

## 2022-06-11 DIAGNOSIS — I420 Dilated cardiomyopathy: Secondary | ICD-10-CM | POA: Diagnosis not present

## 2022-06-11 DIAGNOSIS — M109 Gout, unspecified: Secondary | ICD-10-CM

## 2022-06-11 DIAGNOSIS — Z6837 Body mass index (BMI) 37.0-37.9, adult: Secondary | ICD-10-CM

## 2022-06-11 DIAGNOSIS — E1122 Type 2 diabetes mellitus with diabetic chronic kidney disease: Secondary | ICD-10-CM

## 2022-06-11 DIAGNOSIS — C61 Malignant neoplasm of prostate: Secondary | ICD-10-CM

## 2022-06-11 DIAGNOSIS — E1142 Type 2 diabetes mellitus with diabetic polyneuropathy: Secondary | ICD-10-CM | POA: Diagnosis not present

## 2022-06-11 DIAGNOSIS — E559 Vitamin D deficiency, unspecified: Secondary | ICD-10-CM

## 2022-06-11 DIAGNOSIS — N1832 Chronic kidney disease, stage 3b: Secondary | ICD-10-CM

## 2022-06-11 DIAGNOSIS — E782 Mixed hyperlipidemia: Secondary | ICD-10-CM

## 2022-06-11 NOTE — Patient Instructions (Signed)
Increase water intake-  To follow up lab in 4 weeks.

## 2022-07-08 ENCOUNTER — Other Ambulatory Visit: Payer: Self-pay

## 2022-07-08 DIAGNOSIS — N1832 Chronic kidney disease, stage 3b: Secondary | ICD-10-CM

## 2022-07-09 ENCOUNTER — Other Ambulatory Visit: Payer: Medicare Other

## 2022-07-10 LAB — BASIC METABOLIC PANEL WITH GFR
BUN/Creatinine Ratio: 21 (calc) (ref 6–22)
BUN: 38 mg/dL — ABNORMAL HIGH (ref 7–25)
CO2: 21 mmol/L (ref 20–32)
Calcium: 8.7 mg/dL (ref 8.6–10.3)
Chloride: 110 mmol/L (ref 98–110)
Creat: 1.81 mg/dL — ABNORMAL HIGH (ref 0.70–1.22)
Glucose, Bld: 132 mg/dL — ABNORMAL HIGH (ref 65–99)
Potassium: 4.2 mmol/L (ref 3.5–5.3)
Sodium: 142 mmol/L (ref 135–146)
eGFR: 36 mL/min/{1.73_m2} — ABNORMAL LOW (ref >=60)

## 2022-07-30 ENCOUNTER — Ambulatory Visit (INDEPENDENT_AMBULATORY_CARE_PROVIDER_SITE_OTHER): Payer: Medicare Other | Admitting: Podiatry

## 2022-07-30 ENCOUNTER — Encounter: Payer: Self-pay | Admitting: Podiatry

## 2022-07-30 DIAGNOSIS — M79674 Pain in right toe(s): Secondary | ICD-10-CM | POA: Diagnosis not present

## 2022-07-30 DIAGNOSIS — E1142 Type 2 diabetes mellitus with diabetic polyneuropathy: Secondary | ICD-10-CM

## 2022-07-30 DIAGNOSIS — B351 Tinea unguium: Secondary | ICD-10-CM | POA: Diagnosis not present

## 2022-07-30 DIAGNOSIS — M79675 Pain in left toe(s): Secondary | ICD-10-CM

## 2022-07-30 DIAGNOSIS — E0843 Diabetes mellitus due to underlying condition with diabetic autonomic (poly)neuropathy: Secondary | ICD-10-CM

## 2022-07-30 NOTE — Progress Notes (Signed)
This patient returns to my office for at risk foot care.  This patient requires this care by a professional since this patient will be at risk due to having diabetes with neuropathy.  This patient is unable to cut nails himself since the patient cannot reach his nails.These nails are painful walking and wearing shoes.  This patient presents for at risk foot care today.  General Appearance  Alert, conversant and in no acute stress.  Vascular  Dorsalis pedis and posterior tibial  pulses are palpable  bilaterally.  Capillary return is within normal limits  bilaterally. Temperature is within normal limits  bilaterally.  Neurologic  Senn-Weinstein monofilament wire test within normal limits  bilaterally. Muscle power within normal limits bilaterally.  Nails Thick disfigured discolored nails with subungual debris  from hallux to fifth toes bilaterally. No evidence of bacterial infection or drainage bilaterally.  Orthopedic  No limitations of motion  feet .  No crepitus or effusions noted.  No bony pathology or digital deformities noted.  Skin  normotropic skin with no porokeratosis noted bilaterally.  No signs of infections or ulcers noted.     Onychomycosis  Pain in right toes  Pain in left toes  Consent was obtained for treatment procedures.   Mechanical debridement of nails 1-5  bilaterally performed with a nail nipper.  Filed with dremel without incident.    Return office visit     3 months                Told patient to return for periodic foot care and evaluation due to potential at risk complications.   Recardo Linn DPM  

## 2022-08-17 ENCOUNTER — Ambulatory Visit (HOSPITAL_COMMUNITY): Payer: Medicare Other | Attending: Cardiology

## 2022-08-17 ENCOUNTER — Telehealth (HOSPITAL_BASED_OUTPATIENT_CLINIC_OR_DEPARTMENT_OTHER): Payer: Self-pay

## 2022-08-17 DIAGNOSIS — I350 Nonrheumatic aortic (valve) stenosis: Secondary | ICD-10-CM

## 2022-08-17 LAB — ECHOCARDIOGRAM COMPLETE
AR max vel: 1.08 cm2
AV Area VTI: 1.04 cm2
AV Area mean vel: 1.02 cm2
AV Mean grad: 21 mmHg
AV Peak grad: 38.7 mmHg
Ao pk vel: 3.11 m/s
Area-P 1/2: 3.42 cm2
S' Lateral: 3 cm

## 2022-08-17 NOTE — Telephone Encounter (Addendum)
Left message for patient to call back     ----- Message from Loel Dubonnet, NP sent at 08/17/2022  5:07 PM EDT ----- Echo with normal heart muscle function. Severe thickening of the heart muscle. Dilation of ascending aorta 57m. Aortic valve with moderate stenosis.   Recommend continuing optimal BP control to prevent progression.  Repeat echocardiogram in 1 year for monitoring.  Recommend scheduling OV with Dr. SMarlou Porchor APP for annual recall - CCing [facility] scheduling for assistance.

## 2022-08-18 ENCOUNTER — Other Ambulatory Visit: Payer: Self-pay | Admitting: Cardiology

## 2022-08-18 ENCOUNTER — Encounter: Payer: Self-pay | Admitting: Family

## 2022-08-18 NOTE — Telephone Encounter (Signed)
Advised patient and scheduled visit with Dr Marlou Porch 10/9

## 2022-08-18 NOTE — Telephone Encounter (Signed)
Pt calling for echo results

## 2022-08-18 NOTE — Telephone Encounter (Signed)
This encounter was created in error - please disregard.

## 2022-08-18 NOTE — Telephone Encounter (Signed)
-----   Message from Loel Dubonnet, NP sent at 08/17/2022  5:07 PM EDT ----- Echo with normal heart muscle function. Severe thickening of the heart muscle. Dilation of ascending aorta 47m. Aortic valve with moderate stenosis.   Recommend continuing optimal BP control to prevent progression.  Repeat echocardiogram in 1 year for monitoring.  Recommend scheduling OV with Dr. SMarlou Porchor APP for annual recall - CCing [facility] scheduling for assistance.

## 2022-08-23 ENCOUNTER — Encounter: Payer: Self-pay | Admitting: Cardiology

## 2022-08-23 ENCOUNTER — Ambulatory Visit: Payer: Medicare Other | Attending: Cardiology | Admitting: Cardiology

## 2022-08-23 VITALS — BP 142/80 | HR 55 | Ht 71.0 in | Wt 269.2 lb

## 2022-08-23 DIAGNOSIS — I350 Nonrheumatic aortic (valve) stenosis: Secondary | ICD-10-CM | POA: Diagnosis not present

## 2022-08-23 DIAGNOSIS — I3481 Nonrheumatic mitral (valve) annulus calcification: Secondary | ICD-10-CM

## 2022-08-23 DIAGNOSIS — I700 Atherosclerosis of aorta: Secondary | ICD-10-CM

## 2022-08-23 DIAGNOSIS — I10 Essential (primary) hypertension: Secondary | ICD-10-CM | POA: Diagnosis not present

## 2022-08-23 MED ORDER — ROSUVASTATIN CALCIUM 20 MG PO TABS: 20.0000 mg | ORAL_TABLET | Freq: Every day | ORAL | 3 refills | Status: DC

## 2022-08-23 NOTE — Patient Instructions (Signed)
Medication Instructions:  The current medical regimen is effective;  continue present plan and medications.  *If you need a refill on your cardiac medications before your next appointment, please call your pharmacy*  Testing/Procedures: Your physician has requested that you have an echocardiogram in October 2024. Echocardiography is a painless test that uses sound waves to create images of your heart. It provides your doctor with information about the size and shape of your heart and how well your heart's chambers and valves are working. This procedure takes approximately one hour. There are no restrictions for this procedure. You will be contacted to be scheduled for this testing at a later date.   Follow-Up: At [facility], you and your health needs are our priority.  As part of our continuing mission to provide you with exceptional heart care, we have created designated Provider Care Teams.  These Care Teams include your primary Cardiologist (physician) and Advanced Practice Providers (APPs -  Physician Assistants and Nurse Practitioners) who all work together to provide you with the care you need, when you need it.  We recommend signing up for the patient portal called "MyChart".  Sign up information is provided on this After Visit Summary.  MyChart is used to connect with patients for Virtual Visits (Telemedicine).  Patients are able to view lab/test results, encounter notes, upcoming appointments, etc.  Non-urgent messages can be sent to your provider as well.   To learn more about what you can do with MyChart, go to [email or web address].    Your next appointment:   1 year(s)  The format for your next appointment:   In Person  Provider:   Candee Furbish, MD      Important Information About Sugar

## 2022-08-23 NOTE — Progress Notes (Signed)
Cardiology Office Note:    Date:  08/23/2022   ID:  Jeremiah Collins, DOB 01/21/1934, MRN [id number]  PCP:  Lauree Chandler, NP  [facility] Cardiologist:  Candee Furbish, MD  [facility] Electrophysiologist:  None   Referring MD: Lauree Chandler, NP     History of Present Illness:    Jeremiah Collins is a 88 y.o. male here for the follow-up of aortic stenosis moderate, mildly dilated aorta 42 mm, mitral annular calcification.  March 2017-calcified mass mitral valve dense mitral annular calcification.  No prior stroke.  Retinal malformation observed by ophthalmologist at 1 point.  Statin was increased.  Metastatic prostate cancer.  At his last visit in 07/16/2020 he reported Back pain, disequilibrium. He also mentioned he had some peripheral edema treated with torsemide in the past. Diagnosed with  bilateral carotid artery disease.  He was seen for a one year follow up 08/24/2021 by Laurann Montana NP. At that time his most recent echocardiogram showed  LVEF 55-60%, severe LVH, diastolic dysfunction, RV normal size and function, LA moderately dilated, mild thickening of mitral valve leaflet, moderate mitral annular calcification, tricuspid aortic valve with mild aortic stenosis. A carotid duplex in 2021 showed bilateral 1-79% stenosis which was overall unchanged from previous study in 2018. EKG at this visit showed sinus bradycardia, heart rate at 59 bpm with no acute ST/T wave changes. He was experiencing some dyspnea on exertion, but noted this was unchanged from previous visits. He denied any SOB while at rest. Medications were not changed and he was advised to have an echocardiogram before his next 1 year follow up.   Today, he reports that he has been feeling good for the most part. He has been having some shortness of breath, even just walking around. He said it does not take much to get to the point where he feels like he is panting. His blood count was a little low in May.    His PCP had recommended at least 6 glasses of water a day. He reports that he has been drinking more water even if he does not quite reach 6 full glasses. 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 He is still managing this on his own as much as he can.   He denies any palpitations or chest pain. No lightheadedness, headaches, syncope, orthopnea, or PND.     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/28/2006   Bone metastases    left mid tibial shaft   Cataract    Dr.Groat   Cellulitis and abscess of leg, except foot 01/17/2012   Chest pain, unspecified 04/28/2004   Closed fracture of five ribs 03/24/2004   First degree atrioventricular block 04/25/2007   Gross hematuria 09/06/2011   History of radiation therapy 04/03/14- 04/17/14   mid to distal left tibia 3000 cGy in 10 sessions   HTN (hypertension)    Hx of radiation therapy 11/1998   prostate fossa - 6040 cGy, 33 fx, Dr Danny Lawless   Osteoarthrosis involving, or with mention of more than one site, but not specified as generalized, multiple sites 10/22/2010   Other abnormal blood chemistry 04/29/1991   Other and unspecified hyperlipidemia 01/02/2013   Palpitations 04/28/2004   Prostate cancer ([place]) 12/16/1994   gleason 7   Reflux esophagitis 05/10/2008   Routine general medical examination at a health care facility    Spinal stenosis, unspecified region other than cervical  04/29/1995   Type II or unspecified type diabetes mellitus without mention of complication, uncontrolled     Past Surgical History:  Procedure Laterality Date   AIR/FLUID EXCHANGE Left 01/09/2016   Procedure: AIR/FLUID EXCHANGE;  Surgeon: Jalene Mullet, MD;  Location: [place];  Service: Ophthalmology;  Laterality: Left;   CYSTOSCOPY W/ URETERAL STENT PLACEMENT  12/31/14   CYSTOSCOPY W/ URETERAL STENT PLACEMENT  05/16/2015   EYE SURGERY  2006   cataract, Dr Shanon Rosser   LASER PHOTO ABLATION Left 01/09/2016    Procedure: LASER PHOTO ABLATION;  Surgeon: Jalene Mullet, MD;  Location: [place];  Service: Ophthalmology;  Laterality: Left;  Endolaser   Left ureteral stent placement  Week of 12/05/11   PARS PLANA VITRECTOMY Left 01/09/2016   Procedure: PARS PLANA VITRECTOMY WITH 25 GAUGE LEFT EYE ;  Surgeon: Jalene Mullet, MD;  Location: [place];  Service: Ophthalmology;  Laterality: Left;   PERFLUORONE INJECTION Left 01/09/2016   Procedure: PERFLUORONE INJECTION;  Surgeon: Jalene Mullet, MD;  Location: [place];  Service: Ophthalmology;  Laterality: Left;   PROSTATECTOMY  1996   Dr. Rosana Hoes   URETERAL STENT PLACEMENT  05/2020   [facility] every 3 months    URETERAL STENT PLACEMENT  09/23/2020   Patient gets replaced every 3 months     Current Medications: Current Meds  Medication Sig   acetaminophen (TYLENOL) 650 MG CR tablet Take 650 mg by mouth as needed for pain.   allopurinol (ZYLOPRIM) 100 MG tablet TAKE 2 TABLETS BY MOUTH EVERY DAY   aspirin 81 MG tablet Take 1 tablet (81 mg total) by mouth daily.   Calcium Carb-Cholecalciferol 600-5 MG-MCG TABS Take 2 tablets by mouth daily.   calcium carbonate (TUMS - DOSED IN MG ELEMENTAL CALCIUM) 500 MG chewable tablet Chew 1 tablet by mouth as needed for indigestion or heartburn.   Cholecalciferol 1000 UNITS tablet Take 1,000 Units by mouth daily.   doxazosin (CARDURA) 4 MG tablet TAKE 1 TABLET BY MOUTH EVERY DAY   ferrous sulfate 325 (65 FE) MG EC tablet Take 325 mg by mouth every other day.   glucose blood (ONETOUCH VERIO) test strip Check blood sugar one to two times a week. Dx:E11.29   leuprolide, 6 Month, (ELIGARD) 45 MG injection Inject 45 mg into the skin every 6 (six) months. Filled by [facility]   lisinopril-hydrochlorothiazide (ZESTORETIC) 20-12.5 MG tablet TAKE 2 TABLETS BY MOUTH EVERY MORNING   metoprolol tartrate (LOPRESSOR) 50 MG tablet TAKE 1 TABLET BY MOUTH TWICE A DAY   Omega-3 Fatty Acids (FISH OIL) 1000 MG CAPS Take 1 capsule by mouth  daily.   pyridoxine (B-6) 100 MG tablet Take 100 mg by mouth daily.   [DISCONTINUED] rosuvastatin (CRESTOR) 20 MG tablet TAKE 1 TABLET (20 MG TOTAL) BY MOUTH DAILY. MUST KEEP UPCOMING APPT FOR FURTHER REFILLS.     Allergies:   Abiraterone   Social History   Socioeconomic History   Marital status: Married    Spouse name: Not on file   Number of children: Not on file   Years of education: Not on file   Highest education level: Not on file  Occupational History   Not on file  Tobacco Use   Smoking status: Former    Types: Cigars    Quit date: 12/16/1968    Years since quitting: 53.7   Smokeless tobacco: Never  Vaping Use   Vaping Use: Never used  Substance and Sexual Activity   Alcohol use: No  Alcohol/week: 0.0 standard drinks of alcohol   Drug use: No   Sexual activity: Never  Other Topics Concern   Not on file  Social History Narrative   Not on file   Social Determinants of Health   Financial Resource Strain: Low Risk  (09/15/2018)   Overall Financial Resource Strain (CARDIA)    Difficulty of Paying Living Expenses: Not hard at all  Food Insecurity: No Food Insecurity (09/15/2018)   Hunger Vital Sign    Worried About Running Out of Food in the Last Year: Never true    Ran Out of Food in the Last Year: Never true  Transportation Needs: No Transportation Needs (09/15/2018)   PRAPARE - Hydrologist (Medical): No    Lack of Transportation (Non-Medical): No  Physical Activity: Inactive (09/15/2018)   Exercise Vital Sign    Days of Exercise per Week: 0 days    Minutes of Exercise per Session: 0 min  Stress: No Stress Concern Present (09/15/2018)   [place]    Feeling of Stress : Not at all  Social Connections: Somewhat Isolated (09/15/2018)   Social Connection and Isolation Panel [NHANES]    Frequency of Communication with Friends and Family: Three times a week    Frequency of  Social Gatherings with Friends and Family: Once a week    Attends Religious Services: Never    Marine scientist or Organizations: No    Attends Music therapist: Never    Marital Status: Married     Family History: The patient's family history includes Heart disease in his father; Stroke in his sister.  ROS:   Please see the history of present illness.    (+) SOB (+) bilateral LE edema  All other systems reviewed and are negative.  EKGs/Labs/Other Studies Reviewed:    The following studies were reviewed today:  Echo 08/17/2022:  IMPRESSIONS    1. Left ventricular ejection fraction, by estimation, is 65 to 70%. The  left ventricle has normal function. The left ventricle has no regional  wall motion abnormalities. There is severe asymmetric left ventricular  hypertrophy of the basal-septal  segment. Left ventricular diastolic parameters are indeterminate.   2. Right ventricular systolic function is normal. The right ventricular  size is mildly enlarged. There is normal pulmonary artery systolic  pressure. The estimated right ventricular systolic pressure is 64.6 mmHg.   3. Left atrial size was moderately dilated.   4. The mitral valve is normal in structure. Trivial mitral valve  regurgitation.   5. Aortic dilatation noted. There is dilatation of the ascending aorta,  measuring 42 mm.   6. The inferior vena cava is normal in size with greater than 50%  respiratory variability, suggesting right atrial pressure of 3 mmHg.   7. The aortic valve is calcified. There is moderate calcification of the  aortic valve. Aortic valve regurgitation is not visualized. Moderate  aortic valve stenosis. Vmax 3.1 m/s, MG 21 mmHg, AVA 1.0 cm^2, DI 0.34   Bilateral Carotid Doppler 08/12/2020: Summary:  Right Carotid: Velocities in the right ICA are consistent with a 1-39%  stenosis.   Left Carotid: Velocities in the left ICA are consistent with a 1-39%  stenosis.    Vertebrals:  Bilateral vertebral arteries demonstrate antegrade flow.  Subclavians: Normal flow hemodynamics were seen in bilateral subclavian               arteries.  Echocardiogram 07/26/2019:   1. The left ventricle has normal systolic function, with an ejection  fraction of 55-60%. The cavity size was normal. There is severely  increased left ventricular wall thickness. Left ventricular diastolic  Doppler parameters are consistent with impaired  relaxation.   2. The right ventricle has normal systolic function. The cavity was  normal. There is no increase in right ventricular wall thickness.   3. Left atrial size was moderately dilated.   4. The mitral valve is abnormal. Mild thickening of the mitral valve  leaflet. There is moderate mitral annular calcification present.   5. The aortic valve is tricuspid. Moderate thickening of the aortic  valve. Mild calcification of the aortic valve. Aortic valve regurgitation  is trivial by color flow Doppler. Mild stenosis of the aortic valve.   6. The aorta is normal unless otherwise noted.   7. The left ventricular function has unchanged.   EKG:  EKG is personally reviewed.   08/23/2022: sinus bradycardia 55 bpm.  07/16/2020: sinus bradycardia 56 with no other abnormalities.  Recent Labs: 03/25/2022: Hemoglobin 10.0; Platelets 158 06/03/2022: ALT 11 07/09/2022: BUN 38; Creat 1.81; Potassium 4.2; Sodium 142  Recent Lipid Panel    Component Value Date/Time   CHOL 95 08/04/2021 0812   CHOL 148 04/21/2016 0804   TRIG 94 08/04/2021 0812   HDL 37 (L) 08/04/2021 0812   HDL 40 04/21/2016 0804   CHOLHDL 2.6 08/04/2021 0812   VLDL 17 06/06/2017 0816   LDLCALC 40 08/04/2021 0812    Physical Exam:    VS:  BP (!) 142/80   Pulse (!) 55   Ht '5\' 11"'$  (1.803 m)   Wt 269 lb 3.2 oz (122.1 kg)   SpO2 91%   BMI 37.55 kg/m     Wt Readings from Last 3 Encounters:  08/23/22 269 lb 3.2 oz (122.1 kg)  06/11/22 269 lb 6.4 oz (122.2 kg)  02/05/22  270 lb (122.5 kg)     GEN:  Well nourished, well developed in no acute distress HEENT: Normal NECK: No JVD; No carotid bruits LYMPHATICS: No lymphadenopathy CARDIAC: RRR, 2/6 systolic murmur, no rubs, gallops RESPIRATORY:  Clear to auscultation without rales, wheezing or rhonchi  ABDOMEN: Soft, non-tender, non-distended MUSCULOSKELETAL:  bilateral LE edema; No deformity  SKIN: Warm and dry NEUROLOGIC:  Alert and oriented x 3 PSYCHIATRIC:  Normal affect   ASSESSMENT:    1. Essential hypertension   2. Moderate aortic stenosis   3. Atherosclerosis of aorta ([place])   4. Mitral annular calcification     PLAN:    In order of problems listed above:  Mitral annular calcification -Continue to monitor.  Statin. LDL 61.  Doing well.  No changes  Moderate aortic stenosis -Murmur heard on exam, continue to monitor.  Echocardiogram reviewed as above.  Mildly advanced on this echo.  We will continue to monitor.  No surgical needs at this point.  Dyspnea on exertion/deconditioning noted.  Continue to exercise as best as possible.  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  Hyperlipidemia -Continue with Crestor, LDL 40 from outside labs.  Hemoglobin A1c 6 hemoglobin 10 from 11.4TSH 0.75  Chronic kidney disease stage IIIb - creatinine 1.8 fairly stable  -Continue with Crestor, LDL 40 from outside labs.  Hemoglobin A1c 6 hemoglobin 10 from 11.4TSH 0.75.  Carotid artery plaque -Continue with statin therapy. -Bilateral carotid artery plaque   Prostate cancer -PSA 0.01  Mild anemia - Hemoglobin 10.  Fairly stable.  Could this  be related to chronic kidney disease?Marland Kitchen    Follow up: 1 year follow up, Echocardiogram in 1 year   Medication Adjustments/Labs and Tests Ordered: Current medicines are reviewed at length with the patient today.  Concerns regarding medicines are outlined above.  Orders Placed This Encounter  Procedures   EKG 12-Lead   ECHOCARDIOGRAM COMPLETE   Meds ordered this encounter   Medications   rosuvastatin (CRESTOR) 20 MG tablet    Sig: Take 1 tablet (20 mg total) by mouth daily.    Dispense:  90 tablet    Refill:  3    Patient Instructions  Medication Instructions:  The current medical regimen is effective;  continue present plan and medications.  *If you need a refill on your cardiac medications before your next appointment, please call your pharmacy*  Testing/Procedures: Your physician has requested that you have an echocardiogram in October 2024. Echocardiography is a painless test that uses sound waves to create images of your heart. It provides your doctor with information about the size and shape of your heart and how well your heart's chambers and valves are working. This procedure takes approximately one hour. There are no restrictions for this procedure. You will be contacted to be scheduled for this testing at a later date.   Follow-Up: At [facility], you and your health needs are our priority.  As part of our continuing mission to provide you with exceptional heart care, we have created designated Provider Care Teams.  These Care Teams include your primary Cardiologist (physician) and Advanced Practice Providers (APPs -  Physician Assistants and Nurse Practitioners) who all work together to provide you with the care you need, when you need it.  We recommend signing up for the patient portal called "MyChart".  Sign up information is provided on this After Visit Summary.  MyChart is used to connect with patients for Virtual Visits (Telemedicine).  Patients are able to view lab/test results, encounter notes, upcoming appointments, etc.  Non-urgent messages can be sent to your provider as well.   To learn more about what you can do with MyChart, go to [email or web address].    Your next appointment:   1 year(s)  The format for your next appointment:   In Person  Provider:   Candee Furbish, MD      Important Information About  [place],acting as a scribe for Candee Furbish, MD.,have documented all relevant documentation on the behalf of Candee Furbish, MD,as directed by  Candee Furbish, MD while in the presence of Candee Furbish, MD.   I, Candee Furbish, MD, have reviewed all documentation for this visit. The documentation on 08/23/22 for the exam, diagnosis, procedures, and orders are all accurate and complete.   Signed, Candee Furbish, MD  08/23/2022 8:32 AM    [facility]

## 2022-09-08 ENCOUNTER — Other Ambulatory Visit: Payer: Self-pay | Admitting: Nurse Practitioner

## 2022-09-08 DIAGNOSIS — M109 Gout, unspecified: Secondary | ICD-10-CM

## 2022-09-08 DIAGNOSIS — I10 Essential (primary) hypertension: Secondary | ICD-10-CM

## 2022-10-04 LAB — HEPATIC FUNCTION PANEL
ALT: 12 U/L (ref 10–40)
AST: 14 (ref 14–40)
Alkaline Phosphatase: 68 (ref 25–125)
Bilirubin, Total: 0.3

## 2022-10-04 LAB — CBC AND DIFFERENTIAL
HCT: 31 — AB (ref 41–53)
Hemoglobin: 10.7 — AB (ref 13.5–17.5)
Platelets: 194 10*3/uL (ref 150–400)
WBC: 8.6

## 2022-10-04 LAB — BASIC METABOLIC PANEL
BUN: 26 — AB (ref 4–21)
CO2: 26 — AB (ref 13–22)
Chloride: 108 (ref 99–108)
Creatinine: 1.4 — AB (ref 0.6–1.3)
Glucose: 125
Potassium: 4.6 mEq/L (ref 3.5–5.1)
Sodium: 141 (ref 137–147)

## 2022-10-04 LAB — COMPREHENSIVE METABOLIC PANEL
Albumin: 3.6 (ref 3.5–5.0)
Calcium: 9.1 (ref 8.7–10.7)
eGFR: 47

## 2022-10-04 LAB — CBC: RBC: 3.78 — AB (ref 3.87–5.11)

## 2022-10-04 NOTE — Progress Notes (Unsigned)
   This service is provided via telemedicine  No vital signs collected/recorded due to the encounter was a telemedicine visit.   Location of patient (ex: home, work):  Home  Patient consents to a telephone visit: Yes, see telephone visit dated 10/05/2022  Location of the provider (ex: office, home):  [facility], Office   Name of any referring provider:  N/A  Names of all persons participating in the telemedicine service and their role in the encounter:  S.Chrae B/CMA, Sherrie Mustache, NP, and Patient   Time spent on call:  9 min with medical assistant

## 2022-10-05 ENCOUNTER — Encounter: Payer: Self-pay | Admitting: Nurse Practitioner

## 2022-10-05 ENCOUNTER — Ambulatory Visit (INDEPENDENT_AMBULATORY_CARE_PROVIDER_SITE_OTHER): Payer: Medicare Other | Admitting: Nurse Practitioner

## 2022-10-05 ENCOUNTER — Telehealth: Payer: Self-pay

## 2022-10-05 DIAGNOSIS — Z0000 Encounter for general adult medical examination without abnormal findings: Secondary | ICD-10-CM | POA: Diagnosis not present

## 2022-10-05 NOTE — Patient Instructions (Signed)
Jeremiah Collins , Thank you for taking time to come for your Medicare Wellness Visit. I appreciate your ongoing commitment to your health goals. Please review the following plan we discussed and let me know if I can assist you in the future.   Screening recommendations/referrals: Colonoscopy aged out Recommended yearly ophthalmology/optometry visit for glaucoma screening and checkup Recommended yearly dental visit for hygiene and checkup  Vaccinations: Influenza vaccine due annually in September/October Pneumococcal vaccine up to date Tdap vaccine up to date Shingles vaccine DUE- recommend to get at your local pharmacy    Advanced directives: on file   Conditions/risks identified: advanced age, hypertension  Next appointment: yearly- in person  Preventive Care 47 Years and Older, Male Preventive care refers to lifestyle choices and visits with your health care provider that can promote health and wellness. What does preventive care include? A yearly physical exam. This is also called an annual well check. Dental exams once or twice a year. Routine eye exams. Ask your health care provider how often you should have your eyes checked. Personal lifestyle choices, including: Daily care of your teeth and gums. Regular physical activity. Eating a healthy diet. Avoiding tobacco and drug use. Limiting alcohol use. Practicing safe sex. Taking low doses of aspirin every day. Taking vitamin and mineral supplements as recommended by your health care provider. What happens during an annual well check? The services and screenings done by your health care provider during your annual well check will depend on your age, overall health, lifestyle risk factors, and family history of disease. Counseling  Your health care provider may ask you questions about your: Alcohol use. Tobacco use. Drug use. Emotional well-being. Home and relationship well-being. Sexual activity. Eating habits. History of  falls. Memory and ability to understand (cognition). Work and work Statistician. Screening  You may have the following tests or measurements: Height, weight, and BMI. Blood pressure. Lipid and cholesterol levels. These may be checked every 5 years, or more frequently if you are over 56 years old. Skin check. Lung cancer screening. You may have this screening every year starting at age 92 if you have a 30-pack-year history of smoking and currently smoke or have quit within the past 15 years. Fecal occult blood test (FOBT) of the stool. You may have this test every year starting at age 51. Flexible sigmoidoscopy or colonoscopy. You may have a sigmoidoscopy every 5 years or a colonoscopy every 10 years starting at age 21. Prostate cancer screening. Recommendations will vary depending on your family history and other risks. Hepatitis C blood test. Hepatitis B blood test. Sexually transmitted disease (STD) testing. Diabetes screening. This is done by checking your blood sugar (glucose) after you have not eaten for a while (fasting). You may have this done every 1-3 years. Abdominal aortic aneurysm (AAA) screening. You may need this if you are a current or former smoker. Osteoporosis. You may be screened starting at age 60 if you are at high risk. Talk with your health care provider about your test results, treatment options, and if necessary, the need for more tests. Vaccines  Your health care provider may recommend certain vaccines, such as: Influenza vaccine. This is recommended every year. Tetanus, diphtheria, and acellular pertussis (Tdap, Td) vaccine. You may need a Td booster every 10 years. Zoster vaccine. You may need this after age 58. Pneumococcal 13-valent conjugate (PCV13) vaccine. One dose is recommended after age 70. Pneumococcal polysaccharide (PPSV23) vaccine. One dose is recommended after age 79. Talk to your  health care provider about which screenings and vaccines you need and  how often you need them. This information is not intended to replace advice given to you by your health care provider. Make sure you discuss any questions you have with your health care provider. Document Released: 11/28/2015 Document Revised: 07/21/2016 Document Reviewed: 09/02/2015 Elsevier Interactive Patient Education  2017 [place] Prevention in the Home Falls can cause injuries. They can happen to people of all ages. There are many things you can do to make your home safe and to help prevent falls. What can I do on the outside of my home? Regularly fix the edges of walkways and driveways and fix any cracks. Remove anything that might make you trip as you walk through a door, such as a raised step or threshold. Trim any bushes or trees on the path to your home. Use bright outdoor lighting. Clear any walking paths of anything that might make someone trip, such as rocks or tools. Regularly check to see if handrails are loose or broken. Make sure that both sides of any steps have handrails. Any raised decks and porches should have guardrails on the edges. Have any leaves, snow, or ice cleared regularly. Use sand or salt on walking paths during winter. Clean up any spills in your garage right away. This includes oil or grease spills. What can I do in the bathroom? Use night lights. Install grab bars by the toilet and in the tub and shower. Do not use towel bars as grab bars. Use non-skid mats or decals in the tub or shower. If you need to sit down in the shower, use a plastic, non-slip stool. Keep the floor dry. Clean up any water that spills on the floor as soon as it happens. Remove soap buildup in the tub or shower regularly. Attach bath mats securely with double-sided non-slip rug tape. Do not have throw rugs and other things on the floor that can make you trip. What can I do in the bedroom? Use night lights. Make sure that you have a light by your bed that is easy to  reach. Do not use any sheets or blankets that are too big for your bed. They should not hang down onto the floor. Have a firm chair that has side arms. You can use this for support while you get dressed. Do not have throw rugs and other things on the floor that can make you trip. What can I do in the kitchen? Clean up any spills right away. Avoid walking on wet floors. Keep items that you use a lot in easy-to-reach places. If you need to reach something above you, use a strong step stool that has a grab bar. Keep electrical cords out of the way. Do not use floor polish or wax that makes floors slippery. If you must use wax, use non-skid floor wax. Do not have throw rugs and other things on the floor that can make you trip. What can I do with my stairs? Do not leave any items on the stairs. Make sure that there are handrails on both sides of the stairs and use them. Fix handrails that are broken or loose. Make sure that handrails are as long as the stairways. Check any carpeting to make sure that it is firmly attached to the stairs. Fix any carpet that is loose or worn. Avoid having throw rugs at the top or bottom of the stairs. If you do have throw rugs, attach them  to the floor with carpet tape. Make sure that you have a light switch at the top of the stairs and the bottom of the stairs. If you do not have them, ask someone to add them for you. What else can I do to help prevent falls? Wear shoes that: Do not have high heels. Have rubber bottoms. Are comfortable and fit you well. Are closed at the toe. Do not wear sandals. If you use a stepladder: Make sure that it is fully opened. Do not climb a closed stepladder. Make sure that both sides of the stepladder are locked into place. Ask someone to hold it for you, if possible. Clearly mark and make sure that you can see: Any grab bars or handrails. First and last steps. Where the edge of each step is. Use tools that help you move  around (mobility aids) if they are needed. These include: Canes. Walkers. Scooters. Crutches. Turn on the lights when you go into a dark area. Replace any light bulbs as soon as they burn out. Set up your furniture so you have a clear path. Avoid moving your furniture around. If any of your floors are uneven, fix them. If there are any pets around you, be aware of where they are. Review your medicines with your doctor. Some medicines can make you feel dizzy. This can increase your chance of falling. Ask your doctor what other things that you can do to help prevent falls. This information is not intended to replace advice given to you by your health care provider. Make sure you discuss any questions you have with your health care provider. Document Released: 08/28/2009 Document Revised: 04/08/2016 Document Reviewed: 12/06/2014 Elsevier Interactive Patient Education  2017 [employer].

## 2022-10-05 NOTE — Progress Notes (Signed)
Subjective:   Jeremiah Collins is a 88 y.o. male who presents for Medicare Annual/Subsequent preventive examination.  Review of Systems           Objective:    There were no vitals filed for this visit. There is no height or weight on file to calculate BMI.     10/05/2022    9:01 AM 06/11/2022    8:51 AM 02/05/2022    8:59 AM 09/29/2021    9:01 AM 08/07/2021    8:56 AM 09/24/2020    8:19 AM 01/28/2020    8:27 AM  Advanced Directives  Does Patient Have a Medical Advance Directive? Yes Yes Yes Yes Yes Yes No  Type of Advance Directive Out of facility DNR (pink MOST or yellow form) Out of facility DNR (pink MOST or yellow form) Out of facility DNR (pink MOST or yellow form) Out of facility DNR (pink MOST or yellow form) Out of facility DNR (pink MOST or yellow form) Out of facility DNR (pink MOST or yellow form)   Does patient want to make changes to medical advance directive? Yes (ED - send information to MyChart) No - Patient declined No - Patient declined No - Patient declined No - Patient declined No - Patient declined   Would patient like information on creating a medical advance directive?       No - Patient declined  Pre-existing out of facility DNR order (yellow form or pink MOST form) Pink MOST form placed in chart (order not valid for inpatient use) Pink MOST form placed in chart (order not valid for inpatient use) Pink MOST form placed in chart (order not valid for inpatient use) Pink MOST form placed in chart (order not valid for inpatient use) Pink MOST form placed in chart (order not valid for inpatient use)      Current Medications (verified) Outpatient Encounter Medications as of 10/05/2022  Medication Sig   acetaminophen (TYLENOL) 650 MG CR tablet Take 650 mg by mouth as needed for pain.   allopurinol (ZYLOPRIM) 100 MG tablet TAKE 2 TABLETS BY MOUTH EVERY DAY   aspirin 81 MG tablet Take 1 tablet (81 mg total) by mouth daily.   Calcium Carb-Cholecalciferol 600-5 MG-MCG  TABS Take 2 tablets by mouth daily.   calcium carbonate (TUMS - DOSED IN MG ELEMENTAL CALCIUM) 500 MG chewable tablet Chew 1 tablet by mouth as needed for indigestion or heartburn.   Cholecalciferol 1000 UNITS tablet Take 1,000 Units by mouth daily.   doxazosin (CARDURA) 4 MG tablet TAKE 1 TABLET BY MOUTH EVERY DAY   ferrous sulfate 325 (65 FE) MG EC tablet Take 325 mg by mouth every other day.   leuprolide, 6 Month, (ELIGARD) 45 MG injection Inject 45 mg into the skin every 6 (six) months. Filled by [facility]   lisinopril-hydrochlorothiazide (ZESTORETIC) 20-12.5 MG tablet TAKE 2 TABLETS BY MOUTH EVERY MORNING   metoprolol tartrate (LOPRESSOR) 50 MG tablet TAKE 1 TABLET BY MOUTH TWICE A DAY   Omega-3 Fatty Acids (FISH OIL) 1000 MG CAPS Take 1 capsule by mouth daily.   pyridoxine (B-6) 100 MG tablet Take 100 mg by mouth daily.   rosuvastatin (CRESTOR) 20 MG tablet Take 1 tablet (20 mg total) by mouth daily.   glucose blood (ONETOUCH VERIO) test strip Check blood sugar one to two times a week. Dx:E11.29 (Patient not taking: Reported on 10/05/2022)   No facility-administered encounter medications on file as of 10/05/2022.    Allergies (verified) Abiraterone  History: 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/28/2006   Bone metastases    left mid tibial shaft   Cataract    Dr.Groat   Cellulitis and abscess of leg, except foot 01/17/2012   Chest pain, unspecified 04/28/2004   Closed fracture of five ribs 03/24/2004   First degree atrioventricular block 04/25/2007   Gross hematuria 09/06/2011   History of radiation therapy 04/03/14- 04/17/14   mid to distal left tibia 3000 cGy in 10 sessions   HTN (hypertension)    Hx of radiation therapy 11/1998   prostate fossa - 6040 cGy, 33 fx, Dr Danny Lawless   Osteoarthrosis involving, or with mention of more than one site, but not specified as generalized, multiple sites 10/22/2010   Other  abnormal blood chemistry 04/29/1991   Other and unspecified hyperlipidemia 01/02/2013   Palpitations 04/28/2004   Prostate cancer ([place]) 12/16/1994   gleason 7   Reflux esophagitis 05/10/2008   Routine general medical examination at a health care facility    Spinal stenosis, unspecified region other than cervical 04/29/1995   Type II or unspecified type diabetes mellitus without mention of complication, uncontrolled    Past Surgical History:  Procedure Laterality Date   AIR/FLUID EXCHANGE Left 01/09/2016   Procedure: AIR/FLUID EXCHANGE;  Surgeon: Jalene Mullet, MD;  Location: [place];  Service: Ophthalmology;  Laterality: Left;   CYSTOSCOPY W/ URETERAL STENT PLACEMENT  12/31/14   CYSTOSCOPY W/ URETERAL STENT PLACEMENT  05/16/2015   EYE SURGERY  2006   cataract, Dr Shanon Rosser   LASER PHOTO ABLATION Left 01/09/2016   Procedure: LASER PHOTO ABLATION;  Surgeon: Jalene Mullet, MD;  Location: [place];  Service: Ophthalmology;  Laterality: Left;  Endolaser   Left ureteral stent placement  Week of 12/05/11   PARS PLANA VITRECTOMY Left 01/09/2016   Procedure: PARS PLANA VITRECTOMY WITH 25 GAUGE LEFT EYE ;  Surgeon: Jalene Mullet, MD;  Location: [place];  Service: Ophthalmology;  Laterality: Left;   PERFLUORONE INJECTION Left 01/09/2016   Procedure: PERFLUORONE INJECTION;  Surgeon: Jalene Mullet, MD;  Location: [place];  Service: Ophthalmology;  Laterality: Left;   PROSTATECTOMY  1996   Dr. Rosana Hoes   URETERAL STENT PLACEMENT  05/2020   [facility] every 3 months    URETERAL STENT PLACEMENT  09/23/2020   Patient gets replaced every 3 months    Family History  Problem Relation Age of Onset   Heart disease Father    Stroke Sister    Social History   Socioeconomic History   Marital status: Married    Spouse name: Not on file   Number of children: Not on file   Years of education: Not on file   Highest education level: Not on file  Occupational History   Not on file  Tobacco Use   Smoking status: Former     Types: Cigars    Quit date: 12/16/1968    Years since quitting: 53.8   Smokeless tobacco: Never  Vaping Use   Vaping Use: Never used  Substance and Sexual Activity   Alcohol use: No    Alcohol/week: 0.0 standard drinks of alcohol   Drug use: No   Sexual activity: Never  Other Topics Concern   Not on file  Social History Narrative   Not on file   Social Determinants of Health   Financial Resource Strain: Low Risk  (09/15/2018)   Overall Financial Resource Strain (CARDIA)    Difficulty of Paying Living Expenses:  Not hard at all  Food Insecurity: No Food Insecurity (09/15/2018)   Hunger Vital Sign    Worried About Running Out of Food in the Last Year: Never true    Ran Out of Food in the Last Year: Never true  Transportation Needs: No Transportation Needs (09/15/2018)   PRAPARE - Hydrologist (Medical): No    Lack of Transportation (Non-Medical): No  Physical Activity: Inactive (09/15/2018)   Exercise Vital Sign    Days of Exercise per Week: 0 days    Minutes of Exercise per Session: 0 min  Stress: No Stress Concern Present (09/15/2018)   [place]    Feeling of Stress : Not at all  Social Connections: Somewhat Isolated (09/15/2018)   Social Connection and Isolation Panel [NHANES]    Frequency of Communication with Friends and Family: Three times a week    Frequency of Social Gatherings with Friends and Family: Once a week    Attends Religious Services: Never    Marine scientist or Organizations: No    Attends Archivist Meetings: Never    Marital Status: Married    Tobacco Counseling Counseling given: Not Answered   Clinical Intake:                 Diabetic?yes         Activities of Daily Living     No data to display          Patient Care Team: Lauree Chandler, NP as PCP - General (Geriatric Medicine) Jerline Pain, MD as PCP -  Cardiology (Cardiology) Myrlene Broker, MD as Attending Physician (Urology) [name], MD as Consulting Physician (Ophthalmology)  Indicate any recent Medical Services you may have received from other than [facility] providers in the past year (date may be approximate).     Assessment:   This is a routine wellness examination for [place].  Hearing/Vision screen Hearing Screening - Comments:: No hearing issues Vision Screening - Comments:: Last eye exam less than 12 months ago. Dr.Gould   Dietary issues and exercise activities discussed:     Goals Addressed   None    Depression Screen    10/05/2022    8:58 AM 02/05/2022    9:26 AM 09/29/2021    8:57 AM 01/28/2021    9:31 AM 09/24/2020    8:18 AM 07/30/2020    8:42 AM 09/24/2019    8:45 AM  PHQ 2/9 Scores  PHQ - 2 Score 0 0 0 0 0 0 0    Fall Risk    10/05/2022    8:58 AM 06/11/2022    9:24 AM 02/05/2022    9:26 AM 09/29/2021    9:00 AM 08/07/2021    8:55 AM  Fall Risk   Falls in the past year? 0 0 0 0 0  Number falls in past yr: 0 0 0 0 0  Injury with Fall? 0 0 0 0 0  Risk for fall due to : No Fall Risks No Fall Risks No Fall Risks No Fall Risks No Fall Risks  Follow up Falls evaluation completed Falls evaluation completed Falls evaluation completed Falls evaluation completed Falls evaluation completed    FALL RISK PREVENTION PERTAINING TO THE HOME:  Any stairs in or around the home? Yes  If so, are there any without handrails? No  Home free of loose throw rugs in walkways, pet beds, electrical cords,  etc? Yes  Adequate lighting in your home to reduce risk of falls? No   ASSISTIVE DEVICES UTILIZED TO PREVENT FALLS:  Life alert? No  Use of a cane, walker or w/c? No  Grab bars in the bathroom? Yes  Shower chair or bench in shower? No  Elevated toilet seat or a handicapped toilet? No   TIMED UP AND GO:  Was the test performed? No .    Cognitive Function:    09/24/2019    8:45 AM 09/05/2017    9:15 AM  09/03/2016    9:14 AM 09/10/2014    8:39 AM  MMSE - Mini Mental State Exam  Orientation to time '4 5 5 5  '$ Orientation to Place '5 5 5 5  '$ Registration '3 3 3 3  '$ Attention/ Calculation '3 4 5 5  '$ Recall '2 1 3 3  '$ Language- name 2 objects '2 2 2 2  '$ Language- repeat '1 1 1 1  '$ Language- follow 3 step command '2 3 3 3  '$ Language- read & follow direction '1 1 1 1  '$ Write a sentence 1 1 0 1  Copy design '1 1 1 1  '$ Total score '25 27 29 30        '$ 10/05/2022    9:01 AM 09/29/2021    9:01 AM 09/24/2020    8:21 AM  6CIT Screen  What Year? 0 points 0 points 0 points  What month? 0 points 0 points 0 points  What time? 0 points 0 points 0 points  Count back from 20 2 points 0 points 0 points  Months in reverse 0 points 0 points 0 points  Repeat phrase 6 points 0 points 6 points  Total Score 8 points 0 points 6 points    Immunizations Immunization History  Administered Date(s) Administered   Fluad Quad(high Dose 65+) 07/30/2020, 08/07/2021   Influenza Whole 08/13/2010, 08/16/2011   Influenza, High Dose Seasonal PF 08/19/2016, 09/12/2018, 09/04/2019   Influenza, Quadrivalent, Recombinant, Inj, Pf 07/18/2013, 08/20/2015   Influenza,inj,Quad PF,6+ Mos 07/18/2013, 08/20/2015, 09/05/2017   Influenza-Unspecified 09/01/2016   PFIZER(Purple Top)SARS-COV-2 Vaccination 12/05/2019, 12/23/2019   Pneumococcal Conjugate-13 01/14/2015   Pneumococcal Polysaccharide-23 09/06/2011   Tdap 11/07/2013    TDAP status: Up to date  Flu Vaccine status: Due, Education has been provided regarding the importance of this vaccine. Advised may receive this vaccine at local pharmacy or Health Dept. Aware to provide a copy of the vaccination record if obtained from local pharmacy or Health Dept. Verbalized acceptance and understanding.  Pneumococcal vaccine status: Up to date  Covid-19 vaccine status: Information provided on how to obtain vaccines.   Qualifies for Shingles Vaccine? Yes   Zostavax completed No   Shingrix  Completed?: No.    Education has been provided regarding the importance of this vaccine. Patient has been advised to call insurance company to determine out of pocket expense if they have not yet received this vaccine. Advised may also receive vaccine at local pharmacy or Health Dept. Verbalized acceptance and understanding.  Screening Tests Health Maintenance  Topic Date Due   Zoster Vaccines- Shingrix (1 of 2) Never done   COVID-19 Vaccine (3 - Pfizer risk series) 01/20/2020   INFLUENZA VACCINE  06/15/2022   FOOT EXAM  08/07/2022   Medicare Annual Wellness (AWV)  09/29/2022   OPHTHALMOLOGY EXAM  11/17/2022   HEMOGLOBIN A1C  12/04/2022   Pneumonia Vaccine 75+ Years old  Completed   HPV VACCINES  Aged Out    Health Maintenance  Health  Maintenance Due  Topic Date Due   Zoster Vaccines- Shingrix (1 of 2) Never done   COVID-19 Vaccine (3 - Pfizer risk series) 01/20/2020   INFLUENZA VACCINE  06/15/2022   FOOT EXAM  08/07/2022   Medicare Annual Wellness (AWV)  09/29/2022    Colorectal cancer screening: No longer required.   Lung Cancer Screening: (Low Dose CT Chest recommended if Age 69-80 years, 30 pack-year currently smoking OR have quit w/in 15years.) does not qualify.   Lung Cancer Screening Referral: na  Additional Screening:  Hepatitis C Screening: does not qualify  Vision Screening: Recommended annual ophthalmology exams for early detection of glaucoma and other disorders of the eye. Is the patient up to date with their annual eye exam?  Yes  Who is the provider or what is the name of the office in which the patient attends annual eye exams? [facility] If pt is not established with a provider, would they like to be referred to a provider to establish care? No .   Dental Screening: Recommended annual dental exams for proper oral hygiene  Community Resource Referral / Chronic Care Management: CRR required this visit?  No   CCM required this visit?  No      Plan:     I  have personally reviewed and noted the following in the patient's chart:   Medical and social history Use of alcohol, tobacco or illicit drugs  Current medications and supplements including opioid prescriptions. Patient is not currently taking opioid prescriptions. Functional ability and status Nutritional status Physical activity Advanced directives List of other physicians Hospitalizations, surgeries, and ER visits in previous 12 months Vitals Screenings to include cognitive, depression, and falls Referrals and appointments  In addition, I have reviewed and discussed with patient certain preventive protocols, quality metrics, and best practice recommendations. A written personalized care plan for preventive services as well as general preventive health recommendations were provided to patient.     Lauree Chandler, NP   10/05/2022    Virtual Visit via Telephone Note  I connected with patient 10/05/22 at  9:40 AM EST by telephone and verified that I am speaking with the correct person using two identifiers.  Location: Patient: home Provider: [facility]    I discussed the limitations, risks, security and privacy concerns of performing an evaluation and management service by telephone and the availability of in person appointments. I also discussed with the patient that there may be a patient responsible charge related to this service. The patient expressed understanding and agreed to proceed.   I discussed the assessment and treatment plan with the patient. The patient was provided an opportunity to ask questions and all were answered. The patient agreed with the plan and demonstrated an understanding of the instructions.   The patient was advised to call back or seek an in-person evaluation if the symptoms worsen or if the condition fails to improve as anticipated.  I provided 14 minutes of non-face-to-face time during this encounter.  [employer]. Harle [employer] printed  and mailed

## 2022-10-05 NOTE — Telephone Encounter (Signed)
[name], [place] are scheduled for a virtual visit with your provider today.    Just as we do with appointments in the office, we must obtain your consent to participate.  Your consent will be active for this visit and any virtual visit you may have with one of our providers in the next 365 days.    If you have a MyChart account, I can also send a copy of this consent to you electronically.  All virtual visits are billed to your insurance company just like a traditional visit in the office.  As this is a virtual visit, video technology does not allow for your provider to perform a traditional examination.  This may limit your provider's ability to fully assess your condition.  If your provider identifies any concerns that need to be evaluated in person or the need to arrange testing such as labs, EKG, etc, we will make arrangements to do so.    Although advances in technology are sophisticated, we cannot ensure that it will always work on either your end or our end.  If the connection with a video visit is poor, we may have to switch to a telephone visit.  With either a video or telephone visit, we are not always able to ensure that we have a secure connection.   I need to obtain your verbal consent now.   Are you willing to proceed with your visit today?   Jeremiah Collins has provided verbal consent on 10/05/2022 for a virtual visit (video or telephone).   [name], [place] 10/05/2022  9:15 am

## 2022-10-15 ENCOUNTER — Ambulatory Visit (INDEPENDENT_AMBULATORY_CARE_PROVIDER_SITE_OTHER): Payer: Medicare Other | Admitting: Nurse Practitioner

## 2022-10-15 ENCOUNTER — Telehealth: Payer: Self-pay

## 2022-10-15 ENCOUNTER — Encounter: Payer: Self-pay | Admitting: Nurse Practitioner

## 2022-10-15 VITALS — BP 126/78 | HR 58 | Temp 97.0°F | Ht 71.0 in | Wt 269.6 lb

## 2022-10-15 DIAGNOSIS — I10 Essential (primary) hypertension: Secondary | ICD-10-CM | POA: Diagnosis not present

## 2022-10-15 DIAGNOSIS — I420 Dilated cardiomyopathy: Secondary | ICD-10-CM | POA: Diagnosis not present

## 2022-10-15 DIAGNOSIS — Z23 Encounter for immunization: Secondary | ICD-10-CM | POA: Diagnosis not present

## 2022-10-15 DIAGNOSIS — N1832 Chronic kidney disease, stage 3b: Secondary | ICD-10-CM

## 2022-10-15 DIAGNOSIS — Z6837 Body mass index (BMI) 37.0-37.9, adult: Secondary | ICD-10-CM

## 2022-10-15 DIAGNOSIS — H9390 Unspecified disorder of ear, unspecified ear: Secondary | ICD-10-CM

## 2022-10-15 DIAGNOSIS — M1A9XX0 Chronic gout, unspecified, without tophus (tophi): Secondary | ICD-10-CM

## 2022-10-15 DIAGNOSIS — E1142 Type 2 diabetes mellitus with diabetic polyneuropathy: Secondary | ICD-10-CM

## 2022-10-15 DIAGNOSIS — E782 Mixed hyperlipidemia: Secondary | ICD-10-CM

## 2022-10-15 NOTE — Telephone Encounter (Signed)
Patients wife called back and stated referral should go to the [facility] at [address] Mrs.Hassebrock provided the number [phone] as contact information. I will share this with the referral coordinator.

## 2022-10-15 NOTE — Telephone Encounter (Signed)
The [facility] referral coordinator asked for my assistant with processing dermatology referral, she was unable to find the location listed in there referral comments     I did a google search and spoke with the provider and it was determined that we needed to call the patient for clarification.   Outgoing call was placed to the patient and he was unsure of where referral was to go and asked that I speak with his wife.  Outgoing call placed to patients spouse, left message on voicemail requesting a return call. Awaiting return call.

## 2022-10-15 NOTE — Progress Notes (Signed)
Careteam: Patient Care Team: Lauree Chandler, NP as PCP - General (Geriatric Medicine) Jerline Pain, MD as PCP - Cardiology (Cardiology) Myrlene Broker, MD as Attending Physician (Urology) [name], MD as Consulting Physician (Ophthalmology)  PLACE OF SERVICE:  [place] Directive information    Allergies  Allergen Reactions   Abiraterone Nausea And Vomiting and Other (See Comments)    Other reaction(s): Increased Heart Rate (intolerance)     Chief Complaint  Patient presents with   Medical Management of Chronic Issues    Patient presents today for a 4 month follow-up.     HPI: Patient is a 88 y.o. male for routine follow up.    Followed by [facility] Next stent exhange by Dr Rosana Hoes in Dec- having this every 3 months. He has a lot of urinary leakage.   Osteoporosis- continues on cal and vit d, has reclast yearly.   Metastatic prostate cancer- PSA has been undetectable. No increase in pain.   Anemia- continues on iron supplement every other day  Neuropathy- stable, followed by podiatry every 3 months for toenail trim   Pt reports he has had nonhealing lesion on left ear for several months, has appt with his dermatologist in March but they generally refer to him to skin surgery center for removal due to frequent abnormal/precancerous lesions.  Review of Systems:  Review of Systems  Constitutional:  Negative for chills, fever and weight loss.  HENT:  Negative for tinnitus.   Respiratory:  Negative for cough, sputum production and shortness of breath.   Cardiovascular:  Negative for chest pain, palpitations and leg swelling.  Gastrointestinal:  Negative for abdominal pain, constipation, diarrhea and heartburn.  Genitourinary:  Negative for dysuria, frequency and urgency.  Musculoskeletal:  Negative for back pain, falls, joint pain and myalgias.  Skin: Negative.   Neurological:  Negative for dizziness and headaches.   Psychiatric/Behavioral:  Negative for depression and memory loss. The patient does not have insomnia.     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/28/2006   Bone metastases    left mid tibial shaft   Cataract    Dr.Groat   Cellulitis and abscess of leg, except foot 01/17/2012   Chest pain, unspecified 04/28/2004   Closed fracture of five ribs 03/24/2004   First degree atrioventricular block 04/25/2007   Gross hematuria 09/06/2011   History of radiation therapy 04/03/14- 04/17/14   mid to distal left tibia 3000 cGy in 10 sessions   HTN (hypertension)    Hx of radiation therapy 11/1998   prostate fossa - 6040 cGy, 33 fx, Dr Danny Lawless   Osteoarthrosis involving, or with mention of more than one site, but not specified as generalized, multiple sites 10/22/2010   Other abnormal blood chemistry 04/29/1991   Other and unspecified hyperlipidemia 01/02/2013   Palpitations 04/28/2004   Prostate cancer ([place]) 12/16/1994   gleason 7   Reflux esophagitis 05/10/2008   Routine general medical examination at a health care facility    Spinal stenosis, unspecified region other than cervical 04/29/1995   Type II or unspecified type diabetes mellitus without mention of complication, uncontrolled    Past Surgical History:  Procedure Laterality Date   AIR/FLUID EXCHANGE Left 01/09/2016   Procedure: AIR/FLUID EXCHANGE;  Surgeon: Jalene Mullet, MD;  Location: [facility];  Service: Ophthalmology;  Laterality: Left;   CYSTOSCOPY W/ URETERAL STENT PLACEMENT  12/31/14   CYSTOSCOPY W/  URETERAL STENT PLACEMENT  05/16/2015   EYE SURGERY  2006   cataract, Dr Shanon Rosser   LASER PHOTO ABLATION Left 01/09/2016   Procedure: LASER PHOTO ABLATION;  Surgeon: Jalene Mullet, MD;  Location: [facility];  Service: Ophthalmology;  Laterality: Left;  Endolaser   Left ureteral stent placement  Week of 12/05/11   PARS PLANA VITRECTOMY Left 01/09/2016   Procedure: PARS PLANA VITRECTOMY WITH 25  GAUGE LEFT EYE ;  Surgeon: Jalene Mullet, MD;  Location: [facility];  Service: Ophthalmology;  Laterality: Left;   PERFLUORONE INJECTION Left 01/09/2016   Procedure: PERFLUORONE INJECTION;  Surgeon: Jalene Mullet, MD;  Location: [facility];  Service: Ophthalmology;  Laterality: Left;   PROSTATECTOMY  1996   Dr. Rosana Hoes   URETERAL STENT PLACEMENT  05/2020   [facility] every 3 months    URETERAL STENT PLACEMENT  09/23/2020   Patient gets replaced every 3 months    Social History:   reports that he quit smoking about 53 years ago. His smoking use included cigars. He has never used smokeless tobacco. He reports that he does not drink alcohol and does not use drugs.  Family History  Problem Relation Age of Onset   Heart disease Father    Stroke Sister     Medications: Patient's Medications  New Prescriptions   No medications on file  Previous Medications   ACETAMINOPHEN (TYLENOL) 650 MG CR TABLET    Take 650 mg by mouth as needed for pain.   ALLOPURINOL (ZYLOPRIM) 100 MG TABLET    TAKE 2 TABLETS BY MOUTH EVERY DAY   ASPIRIN 81 MG TABLET    Take 1 tablet (81 mg total) by mouth daily.   CALCIUM CARB-CHOLECALCIFEROL 600-5 MG-MCG TABS    Take 2 tablets by mouth daily.   CALCIUM CARBONATE (TUMS - DOSED IN MG ELEMENTAL CALCIUM) 500 MG CHEWABLE TABLET    Chew 1 tablet by mouth as needed for indigestion or heartburn.   CHOLECALCIFEROL 1000 UNITS TABLET    Take 1,000 Units by mouth daily.   DOXAZOSIN (CARDURA) 4 MG TABLET    TAKE 1 TABLET BY MOUTH EVERY DAY   FERROUS SULFATE 325 (65 FE) MG EC TABLET    Take 325 mg by mouth every other day.   GLUCOSE BLOOD (ONETOUCH VERIO) TEST STRIP    Check blood sugar one to two times a week. Dx:E11.29   LEUPROLIDE, 6 MONTH, (ELIGARD) 45 MG INJECTION    Inject 45 mg into the skin every 6 (six) months. Filled by [facility] (ZESTORETIC) 20-12.5 MG TABLET    TAKE 2 TABLETS BY MOUTH EVERY MORNING   METOPROLOL TARTRATE (LOPRESSOR) 50 MG  TABLET    TAKE 1 TABLET BY MOUTH TWICE A DAY   OMEGA-3 FATTY ACIDS (FISH OIL) 1000 MG CAPS    Take 1 capsule by mouth daily.   PYRIDOXINE (B-6) 100 MG TABLET    Take 100 mg by mouth daily.   ROSUVASTATIN (CRESTOR) 20 MG TABLET    Take 1 tablet (20 mg total) by mouth daily.  Modified Medications   No medications on file  Discontinued Medications   No medications on file    Physical Exam:  Vitals:   10/15/22 0809  BP: 126/78  Pulse: (!) 58  Temp: (!) 97 F (36.1 C)  SpO2: 98%  Weight: 269 lb 9.6 oz (122.3 kg)  Height: '5\' 11"'$  (1.803 m)   Body mass index is 37.6 kg/m. Wt Readings from Last 3 Encounters:  10/15/22 269 lb 9.6 oz (122.3 kg)  08/23/22 269 lb 3.2 oz (122.1 kg)  06/11/22 269 lb 6.4 oz (122.2 kg)    Physical Exam Constitutional:      General: He is not in acute distress.    Appearance: He is well-developed. He is not diaphoretic.  HENT:     Head: Normocephalic and atraumatic.     Right Ear: External ear normal.     Left Ear: External ear normal.     Mouth/Throat:     Pharynx: No oropharyngeal exudate.  Eyes:     Conjunctiva/sclera: Conjunctivae normal.     Pupils: Pupils are equal, round, and reactive to light.  Cardiovascular:     Rate and Rhythm: Normal rate and regular rhythm.     Heart sounds: Normal heart sounds.  Pulmonary:     Effort: Pulmonary effort is normal.     Breath sounds: Normal breath sounds.  Abdominal:     General: Bowel sounds are normal.     Palpations: Abdomen is soft.  Musculoskeletal:        General: No tenderness.     Cervical back: Normal range of motion and neck supple.     Right lower leg: No edema.     Left lower leg: No edema.  Skin:    General: Skin is warm and dry.     Findings: Lesion present.  Neurological:     Mental Status: He is alert and oriented to person, place, and time.     Labs reviewed: Basic Metabolic Panel: Recent Labs    02/02/22 0816 03/25/22 0000 06/03/22 0807 07/09/22 0822  NA 142 139 141  142  K 4.0 3.9 4.6 4.2  CL 107 106 109 110  CO2 27 25* 24 21  GLUCOSE 131  --  136* 132*  BUN 28* 39* 45* 38*  CREATININE 1.53* 1.8* 1.96* 1.81*  CALCIUM 8.7 8.7 8.5* 8.7   Liver Function Tests: Recent Labs    02/02/22 0816 03/25/22 0000 06/03/22 0807  AST '25 15 13  '$ ALT '24 13 11  '$ ALKPHOS  --  64  --   BILITOT 0.4  --  0.3  PROT 5.9*  --  5.9*  ALBUMIN  --  3.5  --    No results for input(s): "LIPASE", "AMYLASE" in the last 8760 hours. No results for input(s): "AMMONIA" in the last 8760 hours. CBC: Recent Labs    02/02/22 0816 03/25/22 0000  WBC 7.9 8.3  NEUTROABS 5,317 6.10  HGB 10.4* 10.0*  HCT 31.8* 29*  MCV 83.7  --   PLT 174 158   Lipid Panel: No results for input(s): "CHOL", "HDL", "LDLCALC", "TRIG", "CHOLHDL", "LDLDIRECT" in the last 8760 hours. TSH: No results for input(s): "TSH" in the last 8760 hours. A1C: Lab Results  Component Value Date   HGBA1C 6.0 (H) 06/03/2022     Assessment/Plan 1. Stage 3b chronic kidney disease (HCC) -Chronic and stable Encourage proper hydration Follow metabolic panel Avoid nephrotoxic meds (NSAIDS)  2. Essential hypertension, benign -Blood pressure well controlled, goal bp <140/90 Continue current medications and dietary modifications follow metabolic panel  3. DM type 2 with diabetic peripheral neuropathy ([place]) -Encouraged dietary compliance, routine foot care/monitoring and to keep up with diabetic eye exams through ophthalmology  - Hemoglobin A1c  4. Congestive dilated cardiomyopathy (HCC) -stable, continues to follow up with cardiology, no worsening shorntess of breath or edema.   5. Mixed hyperlipidemia -continues on crestor - Lipid panel  6. Class 2  severe obesity due to excess calories with serious comorbidity and body mass index (BMI) of 37.0 to 37.9 in adult [facility]) --education provided on healthy weight loss through increase in physical activity and proper nutrition   7. Chronic gout without tophus,  unspecified cause, unspecified site -stable, no recent flare  8. Need for influenza vaccination - Flu Vaccine QUAD High Dose(Fluad)  9. Ear lesion -lesion on antihelix, nonhealing - Ambulatory referral to Dermatology- surgery center for evaluation    Return in about 6 months (around 04/16/2023) for routine follow up. Carlos American. [place], [facility] [phone]

## 2022-10-16 LAB — LIPID PANEL
Cholesterol: 96 mg/dL (ref <200)
HDL: 45 mg/dL (ref >=40)
LDL Cholesterol (Calc): 35 mg/dL (calc)
Non-HDL Cholesterol (Calc): 51 mg/dL (calc) (ref <130)
Total CHOL/HDL Ratio: 2.1 (calc) (ref <5.0)
Triglycerides: 78 mg/dL (ref <150)

## 2022-10-16 LAB — HEMOGLOBIN A1C
Hgb A1c MFr Bld: 6.3 % of total Hgb — ABNORMAL HIGH (ref <5.7)
Mean Plasma Glucose: 134 mg/dL
eAG (mmol/L): 7.4 mmol/L

## 2022-11-03 ENCOUNTER — Encounter: Payer: Self-pay | Admitting: Podiatry

## 2022-11-03 ENCOUNTER — Ambulatory Visit (INDEPENDENT_AMBULATORY_CARE_PROVIDER_SITE_OTHER): Payer: Medicare Other | Admitting: Podiatry

## 2022-11-03 DIAGNOSIS — B351 Tinea unguium: Secondary | ICD-10-CM

## 2022-11-03 DIAGNOSIS — M79674 Pain in right toe(s): Secondary | ICD-10-CM

## 2022-11-03 DIAGNOSIS — M79675 Pain in left toe(s): Secondary | ICD-10-CM | POA: Diagnosis not present

## 2022-11-03 DIAGNOSIS — E0843 Diabetes mellitus due to underlying condition with diabetic autonomic (poly)neuropathy: Secondary | ICD-10-CM

## 2022-11-03 DIAGNOSIS — E1142 Type 2 diabetes mellitus with diabetic polyneuropathy: Secondary | ICD-10-CM

## 2022-11-03 NOTE — Progress Notes (Signed)
This patient returns to my office for at risk foot care.  This patient requires this care by a professional since this patient will be at risk due to having diabetes with neuropathy.  This patient is unable to cut nails himself since the patient cannot reach his nails.These nails are painful walking and wearing shoes.  This patient presents for at risk foot care today.  General Appearance  Alert, conversant and in no acute stress.  Vascular  Dorsalis pedis and posterior tibial  pulses are palpable  bilaterally.  Capillary return is within normal limits  bilaterally. Temperature is within normal limits  bilaterally.  Neurologic  Senn-Weinstein monofilament wire test within normal limits  bilaterally. Muscle power within normal limits bilaterally.  Nails Thick disfigured discolored nails with subungual debris  from hallux to fifth toes bilaterally. No evidence of bacterial infection or drainage bilaterally.  Orthopedic  No limitations of motion  feet .  No crepitus or effusions noted.  No bony pathology or digital deformities noted.  Skin  normotropic skin with no porokeratosis noted bilaterally.  No signs of infections or ulcers noted.     Onychomycosis  Pain in right toes  Pain in left toes  Consent was obtained for treatment procedures.   Mechanical debridement of nails 1-5  bilaterally performed with a nail nipper.  Filed with dremel without incident.    Return office visit     3 months                Told patient to return for periodic foot care and evaluation due to potential at risk complications.   Gardiner Barefoot DPM

## 2022-11-23 ENCOUNTER — Encounter: Payer: Self-pay | Admitting: Nurse Practitioner

## 2022-11-23 LAB — HM DIABETES EYE EXAM

## 2022-12-14 ENCOUNTER — Encounter: Payer: Self-pay | Admitting: Family

## 2022-12-14 ENCOUNTER — Ambulatory Visit: Payer: Medicare Other | Admitting: Family

## 2022-12-14 VITALS — BP 140/80 | HR 80 | Temp 97.0°F | Resp 18 | Ht 71.0 in | Wt 271.2 lb

## 2022-12-14 DIAGNOSIS — R109 Unspecified abdominal pain: Secondary | ICD-10-CM | POA: Diagnosis not present

## 2022-12-14 NOTE — Progress Notes (Signed)
Provider: Renee Erb FNP-C  Lauree Chandler, NP  Patient Care Team: Lauree Chandler, NP as PCP - General (Geriatric Medicine) Jerline Pain, MD as PCP - Cardiology (Cardiology) Myrlene Broker, MD as Attending Physician (Urology) [name], MD as Consulting Physician (Ophthalmology)  Extended Emergency Contact Information Primary Emergency Contact: Narain,Brenda Address: [address]          [place], [place] [address] [place] Phone: [phone] Mobile Phone: [phone] Relation: Spouse  Code Status:  Full Code  Goals of care: Advanced Directive information    12/14/2022   10:00 AM  Advanced Directives  Does Patient Have a Medical Advance Directive? No  Does patient want to make changes to medical advance directive? No - Patient declined     Chief Complaint  Patient presents with   Acute Visit    Patient complains of possible kidney stones.     HPI:  Pt is a 89 y.o. male seen today for an acute visit for evaluation of left flank pain for about 2 months.States has to find a comfort position to sleep.Pain worsen when getting up.Felt like he was going to vomit yesterday.He denies any fever,chills,vomiting,abdominal pain,urgency,frequency,dysuria,difficult urination or hematuria.  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/28/2006   Bone metastases    left mid tibial shaft   Cataract    Dr.Groat   Cellulitis and abscess of leg, except foot 01/17/2012   Chest pain, unspecified 04/28/2004   Closed fracture of five ribs 03/24/2004   First degree atrioventricular block 04/25/2007   Gross hematuria 09/06/2011   History of radiation therapy 04/03/14- 04/17/14   mid to distal left tibia 3000 cGy in 10 sessions   HTN (hypertension)    Hx of radiation therapy 11/1998   prostate fossa - 6040 cGy, 33 fx, Dr Danny Lawless   Osteoarthrosis involving, or with mention of more than one site, but  not specified as generalized, multiple sites 10/22/2010   Other abnormal blood chemistry 04/29/1991   Other and unspecified hyperlipidemia 01/02/2013   Palpitations 04/28/2004   Prostate cancer ([place]) 12/16/1994   gleason 7   Reflux esophagitis 05/10/2008   Routine general medical examination at a health care facility    Spinal stenosis, unspecified region other than cervical 04/29/1995   Type II or unspecified type diabetes mellitus without mention of complication, uncontrolled    Past Surgical History:  Procedure Laterality Date   AIR/FLUID EXCHANGE Left 01/09/2016   Procedure: AIR/FLUID EXCHANGE;  Surgeon: Jalene Mullet, MD;  Location: [place];  Service: Ophthalmology;  Laterality: Left;   CYSTOSCOPY W/ URETERAL STENT PLACEMENT  12/31/14   CYSTOSCOPY W/ URETERAL STENT PLACEMENT  05/16/2015   EYE SURGERY  2006   cataract, Dr Shanon Rosser   LASER PHOTO ABLATION Left 01/09/2016   Procedure: LASER PHOTO ABLATION;  Surgeon: Jalene Mullet, MD;  Location: [facility];  Service: Ophthalmology;  Laterality: Left;  Endolaser   Left ureteral stent placement  Week of 12/05/11   PARS PLANA VITRECTOMY Left 01/09/2016   Procedure: PARS PLANA VITRECTOMY WITH 25 GAUGE LEFT EYE ;  Surgeon: Jalene Mullet, MD;  Location: [place];  Service: Ophthalmology;  Laterality: Left;   PERFLUORONE INJECTION Left 01/09/2016   Procedure: PERFLUORONE INJECTION;  Surgeon: Jalene Mullet, MD;  Location: [place];  Service: Ophthalmology;  Laterality: Left;   PROSTATECTOMY  1996   Dr. Rosana Hoes   URETERAL STENT PLACEMENT  05/2020   [facility] every  3 months    URETERAL STENT PLACEMENT  09/23/2020   Patient gets replaced every 3 months     Allergies  Allergen Reactions   Abiraterone Nausea And Vomiting and Other (See Comments)    Other reaction(s): Increased Heart Rate (intolerance)     Outpatient Encounter Medications as of 12/14/2022  Medication Sig   acetaminophen (TYLENOL) 650 MG CR tablet Take 650 mg by mouth as needed for pain.    allopurinol (ZYLOPRIM) 100 MG tablet TAKE 2 TABLETS BY MOUTH EVERY DAY   aspirin 81 MG tablet Take 1 tablet (81 mg total) by mouth daily.   Calcium Carb-Cholecalciferol 600-5 MG-MCG TABS Take 2 tablets by mouth daily.   calcium carbonate (TUMS - DOSED IN MG ELEMENTAL CALCIUM) 500 MG chewable tablet Chew 1 tablet by mouth as needed for indigestion or heartburn.   Cholecalciferol 1000 UNITS tablet Take 1,000 Units by mouth daily.   doxazosin (CARDURA) 4 MG tablet TAKE 1 TABLET BY MOUTH EVERY DAY   ferrous sulfate 325 (65 FE) MG EC tablet Take 325 mg by mouth every other day.   glucose blood (ONETOUCH VERIO) test strip Check blood sugar one to two times a week. Dx:E11.29   leuprolide, 6 Month, (ELIGARD) 45 MG injection Inject 45 mg into the skin every 6 (six) months. Filled by [facility]   lisinopril-hydrochlorothiazide (ZESTORETIC) 20-12.5 MG tablet TAKE 2 TABLETS BY MOUTH EVERY MORNING   metoprolol tartrate (LOPRESSOR) 50 MG tablet TAKE 1 TABLET BY MOUTH TWICE A DAY   Omega-3 Fatty Acids (FISH OIL) 1000 MG CAPS Take 1 capsule by mouth daily.   pyridoxine (B-6) 100 MG tablet Take 100 mg by mouth daily.   rosuvastatin (CRESTOR) 20 MG tablet Take 1 tablet (20 mg total) by mouth daily.   No facility-administered encounter medications on file as of 12/14/2022.    Review of Systems  Constitutional:  Negative for appetite change, chills, fatigue, fever and unexpected weight change.  Respiratory:  Negative for cough, chest tightness, shortness of breath and wheezing.   Cardiovascular:  Negative for chest pain, palpitations and leg swelling.  Gastrointestinal:  Negative for abdominal distention, abdominal pain, blood in stool, constipation, diarrhea, nausea and vomiting.  Genitourinary:  Positive for flank pain. Negative for difficulty urinating, dysuria, frequency and urgency.  Musculoskeletal:  Negative for arthralgias, back pain and gait problem.  Skin:  Negative for color change, pallor, rash  and wound.  Neurological:  Negative for dizziness, weakness, light-headedness and headaches.    Immunization History  Administered Date(s) Administered   Fluad Quad(high Dose 65+) 07/30/2020, 08/07/2021, 10/15/2022   Influenza Whole 08/13/2010, 08/16/2011   Influenza, High Dose Seasonal PF 08/19/2016, 09/12/2018, 09/04/2019   Influenza, Quadrivalent, Recombinant, Inj, Pf 07/18/2013, 08/20/2015   Influenza,inj,Quad PF,6+ Mos 07/18/2013, 08/20/2015, 09/05/2017   Influenza-Unspecified 09/01/2016   PFIZER(Purple Top)SARS-COV-2 Vaccination 12/05/2019, 12/23/2019   Pneumococcal Conjugate-13 01/14/2015   Pneumococcal Polysaccharide-23 09/06/2011   Tdap 11/07/2013   Pertinent  Health Maintenance Due  Topic Date Due   HEMOGLOBIN A1C  04/16/2023   FOOT EXAM  11/04/2023   OPHTHALMOLOGY EXAM  11/24/2023   INFLUENZA VACCINE  Completed      02/05/2022    9:26 AM 06/11/2022    9:24 AM 10/05/2022    8:58 AM 10/15/2022    8:11 AM 12/14/2022   10:00 AM  [place] in the past year? 0 0 0 0 0  Was there an injury with Fall? 0 0 0 0 0  Fall Risk Category  Calculator 0 0 0 0 0  Fall Risk Category (Retired) Low Low Low Low   (RETIRED) Patient Fall Risk Level Low fall risk Low fall risk Low fall risk Low fall risk   Patient at Risk for Falls Due to No Fall Risks No Fall Risks No Fall Risks No Fall Risks No Fall Risks  Fall risk Follow up Falls evaluation completed Falls evaluation completed Falls evaluation completed Falls evaluation completed Falls evaluation completed   Functional Status Survey:    Vitals:   12/14/22 0958  BP: (!) 140/80  Pulse: 80  Resp: 18  Temp: (!) 97 F (36.1 C)  SpO2: 93%  Weight: 271 lb 3.2 oz (123 kg)  Height: '5\' 11"'$  (1.803 m)   Body mass index is 37.82 kg/m. Physical Exam Vitals reviewed.  Constitutional:      General: He is not in acute distress.    Appearance: Normal appearance. He is obese. He is not ill-appearing or diaphoretic.  HENT:     Head:  Normocephalic.  Eyes:     General: No scleral icterus.       Right eye: No discharge.        Left eye: No discharge.     Conjunctiva/sclera: Conjunctivae normal.     Pupils: Pupils are equal, round, and reactive to light.  Cardiovascular:     Rate and Rhythm: Normal rate and regular rhythm.     Pulses: Normal pulses.     Heart sounds: Normal heart sounds. No murmur heard.    No friction rub. No gallop.  Pulmonary:     Effort: Pulmonary effort is normal. No respiratory distress.     Breath sounds: Normal breath sounds. No wheezing, rhonchi or rales.  Chest:     Chest wall: No tenderness.  Abdominal:     General: Bowel sounds are normal. There is no distension.     Palpations: Abdomen is soft. There is no mass.     Tenderness: There is no abdominal tenderness. There is left CVA tenderness. There is no right CVA tenderness, guarding or rebound.  Musculoskeletal:        General: No swelling or tenderness. Normal range of motion.     Right lower leg: No edema.     Left lower leg: No edema.  Skin:    General: Skin is warm and dry.     Coloration: Skin is not pale.     Findings: No bruising, erythema, lesion or rash.  Neurological:     Mental Status: He is alert and oriented to person, place, and time.     Motor: No weakness.     Gait: Gait normal.  Psychiatric:        Mood and Affect: Mood normal.        Speech: Speech normal.        Behavior: Behavior normal.        Thought Content: Thought content normal.        Judgment: Judgment normal.     Labs reviewed: Recent Labs    02/02/22 0816 03/25/22 0000 06/03/22 0807 07/09/22 0822 10/04/22 0000  NA 142   < > 141 142 141  K 4.0   < > 4.6 4.2 4.6  CL 107   < > 109 110 108  CO2 27   < > 24 21 26*  GLUCOSE 131  --  136* 132*  --   BUN 28*   < > 45* 38* 26*  CREATININE 1.53*   < >  1.96* 1.81* 1.4*  CALCIUM 8.7   < > 8.5* 8.7 9.1   < > = values in this interval not displayed.   Recent Labs    02/02/22 0816  03/25/22 0000 06/03/22 0807 10/04/22 0000  AST '25 15 13 14  '$ ALT '24 13 11 12  '$ ALKPHOS  --  64  --  68  BILITOT 0.4  --  0.3  --   PROT 5.9*  --  5.9*  --   ALBUMIN  --  3.5  --  3.6   Recent Labs    02/02/22 0816 03/25/22 0000 10/04/22 0000  WBC 7.9 8.3 8.6  NEUTROABS 5,317 6.10  --   HGB 10.4* 10.0* 10.7*  HCT 31.8* 29* 31*  MCV 83.7  --   --   PLT 174 158 194   Lab Results  Component Value Date   TSH 0.75 09/24/2019   Lab Results  Component Value Date   HGBA1C 6.3 (H) 10/15/2022   Lab Results  Component Value Date   CHOL 96 10/15/2022   HDL 45 10/15/2022   LDLCALC 35 10/15/2022   TRIG 78 10/15/2022   CHOLHDL 2.1 10/15/2022    Significant Diagnostic Results in last 30 days:  No results found.  Assessment/Plan  Left flank pain Left CVA tenderness  Afebrile - unable to give urine specimen during visit.Specimen supplies given to bring urine specimen to lab tomorrow.  - will obtain lab work rule out other other acute abnormalities.  - US RENAL - Urine Culture; Future - CBC with Differential/Platelet - COMPLETE METABOLIC PANEL WITH GFR - Urinalysis - Urine Culture  Family/ staff Communication: Reviewed plan of care with patient Verbalized understanding  Labs/tests ordered:  - US RENAL - Urine Culture; Future - CBC with Differential/Platelet - COMPLETE METABOLIC PANEL WITH GFR - Urinalysis - Urine Culture  Next Appointment: Return if symptoms worsen or fail to improve.   Sandrea Hughs, NP

## 2022-12-14 NOTE — Patient Instructions (Signed)
-  Please get left renal ultrasound at [place] at [facility] then will call you with results.  - Notify provider or go to ED if symptoms worsen

## 2022-12-15 LAB — CBC WITH DIFFERENTIAL/PLATELET
Absolute Monocytes: 472 cells/uL (ref 200–950)
Basophils Absolute: 32 cells/uL (ref 0–200)
Basophils Relative: 0.4 %
Eosinophils Absolute: 88 cells/uL (ref 15–500)
Eosinophils Relative: 1.1 %
HCT: 32.8 % — ABNORMAL LOW (ref 38.5–50.0)
Hemoglobin: 10.9 g/dL — ABNORMAL LOW (ref 13.2–17.1)
Lymphs Abs: 1488 cells/uL (ref 850–3900)
MCH: 28.4 pg (ref 27.0–33.0)
MCHC: 33.2 g/dL (ref 32.0–36.0)
MCV: 85.4 fL (ref 80.0–100.0)
MPV: 9.7 fL (ref 7.5–12.5)
Monocytes Relative: 5.9 %
Neutro Abs: 5920 cells/uL (ref 1500–7800)
Neutrophils Relative %: 74 %
Platelets: 168 10*3/uL (ref 140–400)
RBC: 3.84 10*6/uL — ABNORMAL LOW (ref 4.20–5.80)
RDW: 14.2 % (ref 11.0–15.0)
Total Lymphocyte: 18.6 %
WBC: 8 10*3/uL (ref 3.8–10.8)

## 2022-12-15 LAB — COMPLETE METABOLIC PANEL WITH GFR
AG Ratio: 1.4 (calc) (ref 1.0–2.5)
ALT: 11 U/L (ref 9–46)
AST: 15 U/L (ref 10–35)
Albumin: 3.4 g/dL — ABNORMAL LOW (ref 3.6–5.1)
Alkaline phosphatase (APISO): 72 U/L (ref 35–144)
BUN/Creatinine Ratio: 24 (calc) — ABNORMAL HIGH (ref 6–22)
BUN: 33 mg/dL — ABNORMAL HIGH (ref 7–25)
CO2: 25 mmol/L (ref 20–32)
Calcium: 8.6 mg/dL (ref 8.6–10.3)
Chloride: 106 mmol/L (ref 98–110)
Creat: 1.35 mg/dL — ABNORMAL HIGH (ref 0.70–1.22)
Globulin: 2.5 g/dL (calc) (ref 1.9–3.7)
Glucose, Bld: 156 mg/dL — ABNORMAL HIGH (ref 65–99)
Potassium: 4.1 mmol/L (ref 3.5–5.3)
Sodium: 140 mmol/L (ref 135–146)
Total Bilirubin: 0.4 mg/dL (ref 0.2–1.2)
Total Protein: 5.9 g/dL — ABNORMAL LOW (ref 6.1–8.1)
eGFR: 51 mL/min/{1.73_m2} — ABNORMAL LOW (ref >=60)

## 2022-12-17 LAB — URINE CULTURE
MICRO NUMBER:: 14499783
SPECIMEN QUALITY:: ADEQUATE

## 2022-12-17 LAB — URINALYSIS
Bilirubin Urine: NEGATIVE
Glucose, UA: NEGATIVE
Ketones, ur: NEGATIVE
Nitrite: NEGATIVE
Protein, ur: NEGATIVE
Specific Gravity, Urine: 1.015 (ref 1.001–1.035)
pH: 6 (ref 5.0–8.0)

## 2022-12-21 ENCOUNTER — Other Ambulatory Visit: Payer: Self-pay

## 2022-12-21 DIAGNOSIS — N390 Urinary tract infection, site not specified: Secondary | ICD-10-CM

## 2022-12-21 MED ORDER — SACCHAROMYCES BOULARDII 250 MG PO CAPS: 250.0000 mg | ORAL_CAPSULE | Freq: Two times a day (BID) | ORAL | 0 refills | Status: DC

## 2022-12-21 MED ORDER — CIPROFLOXACIN HCL 500 MG PO TABS: 500.0000 mg | ORAL_TABLET | Freq: Two times a day (BID) | ORAL | 0 refills | Status: DC

## 2022-12-31 ENCOUNTER — Ambulatory Visit
Admission: RE | Admit: 2022-12-31 | Discharge: 2022-12-31 | Disposition: A | Discharge status: Home / Self Care | Payer: Medicare Other | Source: Ambulatory Visit | Attending: Family | Admitting: Family

## 2023-02-04 ENCOUNTER — Encounter: Payer: Self-pay | Admitting: Podiatry

## 2023-02-04 ENCOUNTER — Ambulatory Visit (INDEPENDENT_AMBULATORY_CARE_PROVIDER_SITE_OTHER): Payer: Medicare Other | Admitting: Podiatry

## 2023-02-04 DIAGNOSIS — M79674 Pain in right toe(s): Secondary | ICD-10-CM

## 2023-02-04 DIAGNOSIS — B351 Tinea unguium: Secondary | ICD-10-CM

## 2023-02-04 DIAGNOSIS — M79675 Pain in left toe(s): Secondary | ICD-10-CM | POA: Diagnosis not present

## 2023-02-04 DIAGNOSIS — E0843 Diabetes mellitus due to underlying condition with diabetic autonomic (poly)neuropathy: Secondary | ICD-10-CM | POA: Diagnosis not present

## 2023-02-04 NOTE — Progress Notes (Signed)
This patient returns to my office for at risk foot care.  This patient requires this care by a professional since this patient will be at risk due to having diabetes with neuropathy.  This patient is unable to cut nails himself since the patient cannot reach his nails.These nails are painful walking and wearing shoes.  This patient presents for at risk foot care today.  General Appearance  Alert, conversant and in no acute stress.  Vascular  Dorsalis pedis and posterior tibial  pulses are palpable  bilaterally.  Capillary return is within normal limits  bilaterally. Temperature is within normal limits  bilaterally.  Neurologic  Senn-Weinstein monofilament wire test within normal limits  bilaterally. Muscle power within normal limits bilaterally.  Nails Thick disfigured discolored nails with subungual debris  from hallux to fifth toes bilaterally. No evidence of bacterial infection or drainage bilaterally.  Orthopedic  No limitations of motion  feet .  No crepitus or effusions noted.  No bony pathology or digital deformities noted.  Skin  normotropic skin with no porokeratosis noted bilaterally.  No signs of infections or ulcers noted.     Onychomycosis  Pain in right toes  Pain in left toes  Consent was obtained for treatment procedures.   Mechanical debridement of nails 1-5  bilaterally performed with a nail nipper.  Filed with dremel without incident.    Return office visit     3 months                Told patient to return for periodic foot care and evaluation due to potential at risk complications.   Imya Mance DPM  

## 2023-03-04 ENCOUNTER — Other Ambulatory Visit: Payer: Self-pay | Admitting: Nurse Practitioner

## 2023-03-04 DIAGNOSIS — M109 Gout, unspecified: Secondary | ICD-10-CM

## 2023-03-04 DIAGNOSIS — I10 Essential (primary) hypertension: Secondary | ICD-10-CM

## 2023-04-18 ENCOUNTER — Ambulatory Visit: Payer: Medicare Other | Admitting: Nurse Practitioner

## 2023-04-18 ENCOUNTER — Encounter: Payer: Self-pay | Admitting: Nurse Practitioner

## 2023-04-18 VITALS — BP 122/62 | HR 70 | Temp 96.6°F | Resp 17 | Ht 71.0 in | Wt 271.0 lb

## 2023-04-18 DIAGNOSIS — E782 Mixed hyperlipidemia: Secondary | ICD-10-CM

## 2023-04-18 DIAGNOSIS — N1832 Chronic kidney disease, stage 3b: Secondary | ICD-10-CM | POA: Diagnosis not present

## 2023-04-18 DIAGNOSIS — C61 Malignant neoplasm of prostate: Secondary | ICD-10-CM

## 2023-04-18 DIAGNOSIS — Z6837 Body mass index (BMI) 37.0-37.9, adult: Secondary | ICD-10-CM

## 2023-04-18 DIAGNOSIS — E1142 Type 2 diabetes mellitus with diabetic polyneuropathy: Secondary | ICD-10-CM

## 2023-04-18 DIAGNOSIS — D509 Iron deficiency anemia, unspecified: Secondary | ICD-10-CM | POA: Diagnosis not present

## 2023-04-18 DIAGNOSIS — I10 Essential (primary) hypertension: Secondary | ICD-10-CM | POA: Diagnosis not present

## 2023-04-18 DIAGNOSIS — M1A9XX0 Chronic gout, unspecified, without tophus (tophi): Secondary | ICD-10-CM

## 2023-04-18 DIAGNOSIS — I420 Dilated cardiomyopathy: Secondary | ICD-10-CM

## 2023-04-18 DIAGNOSIS — E441 Mild protein-calorie malnutrition: Secondary | ICD-10-CM

## 2023-04-18 NOTE — Patient Instructions (Signed)
To get low sugar protein supplement, take in addition to smallest meal of the day- or between meals. Recommend 3 meals a day with good protein intake.

## 2023-04-18 NOTE — Progress Notes (Signed)
Careteam: Patient Care Team: [name], NP as PCP - General (Geriatric Medicine) Jake Bathe, MD as PCP - Cardiology (Cardiology) Esaw Dace, MD as Attending Physician (Urology) Elise Benne, MD as Consulting Physician (Ophthalmology)  PLACE OF SERVICE:  [facility]  Advanced Directive information Does Patient Have a Medical Advance Directive?: Yes, Type of Advance Directive: Out of facility DNR (pink MOST or yellow form), Does patient want to make changes to medical advance directive?: No - Patient declined  Allergies  Allergen Reactions   Abiraterone Nausea And Vomiting and Other (See Comments)    Other reaction(s): Increased Heart Rate (intolerance)     Chief Complaint  Patient presents with   Medical Management of Chronic Issues    6 month follow up.   Immunizations    Discuss the need for Shingrix vaccine, and Covid Booster.   Health Maintenance    Discuss the need for Hemoglobin A1C.      HPI: Patient is a 89 y.o. male for routine follow up.   DM- does not check blood sugars at home.  Has been doing well. No hypoglycemia.  Neuropathy is unchanged. No problems with gait.  No recent falls.   Htn- will check blood pressure at home, 143/79  Anemia- iron every other day. No constipation.   Only eating 2 meals a day. Has hx of low protein. Eating more nuts so hoping protein level has improved.   Ureteral obstruction- followed by Dr Earlene Plater, continues with stent exchange every 3 months.  Review of Systems:  Review of Systems  Constitutional:  Negative for chills, fever and weight loss.  HENT:  Negative for tinnitus.   Respiratory:  Negative for cough, sputum production and shortness of breath.   Cardiovascular:  Negative for chest pain, palpitations and leg swelling.  Gastrointestinal:  Negative for abdominal pain, constipation, diarrhea and heartburn.  Genitourinary:  Negative for dysuria, frequency and urgency.  Musculoskeletal:  Positive  for joint pain. Negative for back pain, falls and myalgias.  Skin: Negative.   Neurological:  Positive for dizziness (when he stands up) and tingling. Negative for headaches.  Psychiatric/Behavioral:  Negative for depression and memory loss. The patient does not have insomnia.     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/28/2006   Bone metastases    left mid tibial shaft   Cataract    Dr.Groat   Cellulitis and abscess of leg, except foot 01/17/2012   Chest pain, unspecified 04/28/2004   Closed fracture of five ribs 03/24/2004   First degree atrioventricular block 04/25/2007   Gross hematuria 09/06/2011   History of radiation therapy 04/03/14- 04/17/14   mid to distal left tibia 3000 cGy in 10 sessions   HTN (hypertension)    Hx of radiation therapy 11/1998   prostate fossa - 6040 cGy, 33 fx, Dr Dan Humphreys   Osteoarthrosis involving, or with mention of more than one site, but not specified as generalized, multiple sites 10/22/2010   Other abnormal blood chemistry 04/29/1991   Other and unspecified hyperlipidemia 01/02/2013   Palpitations 04/28/2004   Prostate cancer ([facility]) 12/16/1994   gleason 7   Reflux esophagitis 05/10/2008   Routine general medical examination at a health care facility    Spinal stenosis, unspecified region other than cervical 04/29/1995   Type II or unspecified type diabetes mellitus without mention of complication, uncontrolled    Past Surgical History:  Procedure Laterality Date   AIR/FLUID EXCHANGE  Left 01/09/2016   Procedure: AIR/FLUID EXCHANGE;  Surgeon: Carmela Rima, MD;  Location: [facility] OR;  Service: Ophthalmology;  Laterality: Left;   CYSTOSCOPY W/ URETERAL STENT PLACEMENT  12/31/14   CYSTOSCOPY W/ URETERAL STENT PLACEMENT  05/16/2015   EYE SURGERY  2006   cataract, Dr Katrina Stack   LASER PHOTO ABLATION Left 01/09/2016   Procedure: LASER PHOTO ABLATION;  Surgeon: Carmela Rima, MD;  Location: [facility] OR;  Service:  Ophthalmology;  Laterality: Left;  Endolaser   Left ureteral stent placement  Week of 12/05/11   PARS PLANA VITRECTOMY Left 01/09/2016   Procedure: PARS PLANA VITRECTOMY WITH 25 GAUGE LEFT EYE ;  Surgeon: Carmela Rima, MD;  Location: [facility] OR;  Service: Ophthalmology;  Laterality: Left;   PERFLUORONE INJECTION Left 01/09/2016   Procedure: PERFLUORONE INJECTION;  Surgeon: Carmela Rima, MD;  Location: [facility] OR;  Service: Ophthalmology;  Laterality: Left;   PROSTATECTOMY  1996   Dr. Earlene Plater   URETERAL STENT PLACEMENT  05/2020   [facility] every 3 months    URETERAL STENT PLACEMENT  09/23/2020   Patient gets replaced every 3 months    Social History:   reports that he quit smoking about 54 years ago. His smoking use included cigars. He has never used smokeless tobacco. He reports that he does not drink alcohol and does not use drugs.  Family History  Problem Relation Age of Onset   Heart disease Father    Stroke Sister     Medications: Patient's Medications  New Prescriptions   No medications on file  Previous Medications   ACETAMINOPHEN (TYLENOL) 650 MG CR TABLET    Take 650 mg by mouth as needed for pain.   ALLOPURINOL (ZYLOPRIM) 100 MG TABLET    TAKE 2 TABLETS BY MOUTH EVERY DAY   ASPIRIN 81 MG TABLET    Take 1 tablet (81 mg total) by mouth daily.   CALCIUM CARB-CHOLECALCIFEROL 600-5 MG-MCG TABS    Take 2 tablets by mouth daily.   CALCIUM CARBONATE (TUMS - DOSED IN MG ELEMENTAL CALCIUM) 500 MG CHEWABLE TABLET    Chew 1 tablet by mouth as needed for indigestion or heartburn.   CHOLECALCIFEROL 1000 UNITS TABLET    Take 1,000 Units by mouth daily.   DOXAZOSIN (CARDURA) 4 MG TABLET    TAKE 1 TABLET BY MOUTH EVERY DAY   FERROUS SULFATE 325 (65 FE) MG EC TABLET    Take 325 mg by mouth every other day.   GLUCOSE BLOOD (ONETOUCH VERIO) TEST STRIP    Check blood sugar one to two times a week. Dx:E11.29   LEUPROLIDE, 6 MONTH, (ELIGARD) 45 MG INJECTION    Inject 45 mg into the skin every 6 (six)  months. Filled by [facility]   LISINOPRIL-HYDROCHLOROTHIAZIDE (ZESTORETIC) 20-12.5 MG TABLET    TAKE 2 TABLETS BY MOUTH EVERY MORNING   METOPROLOL TARTRATE (LOPRESSOR) 50 MG TABLET    TAKE 1 TABLET BY MOUTH TWICE A DAY   OMEGA-3 FATTY ACIDS (FISH OIL) 1000 MG CAPS    Take 1 capsule by mouth daily.   PYRIDOXINE (B-6) 100 MG TABLET    Take 100 mg by mouth daily.   ROSUVASTATIN (CRESTOR) 20 MG TABLET    Take 1 tablet (20 mg total) by mouth daily.  Modified Medications   No medications on file  Discontinued Medications   CIPROFLOXACIN (CIPRO) 500 MG TABLET    Take 1 tablet (500 mg total) by mouth 2 (two) times daily.   SACCHAROMYCES BOULARDII (FLORASTOR) 250  MG CAPSULE    Take 1 capsule (250 mg total) by mouth 2 (two) times daily.    Physical Exam:  Vitals:   04/18/23 0810  BP: (!) 142/70  Pulse: 70  Resp: 17  Temp: (!) 96.6 F (35.9 C)  SpO2: 91%  Weight: 271 lb (122.9 kg)  Height: 5\' 11"  (1.803 m)   Body mass index is 37.8 kg/m. Wt Readings from Last 3 Encounters:  04/18/23 271 lb (122.9 kg)  12/14/22 271 lb 3.2 oz (123 kg)  10/15/22 269 lb 9.6 oz (122.3 kg)    Physical Exam Constitutional:      General: He is not in acute distress.    Appearance: He is well-developed. He is obese. He is not diaphoretic.  HENT:     Head: Normocephalic and atraumatic.     Right Ear: External ear normal.     Left Ear: External ear normal.     Mouth/Throat:     Pharynx: No oropharyngeal exudate.  Eyes:     Conjunctiva/sclera: Conjunctivae normal.     Pupils: Pupils are equal, round, and reactive to light.  Cardiovascular:     Rate and Rhythm: Normal rate and regular rhythm.     Heart sounds: Normal heart sounds.  Pulmonary:     Effort: Pulmonary effort is normal.     Breath sounds: Normal breath sounds.  Abdominal:     General: Bowel sounds are normal.     Palpations: Abdomen is soft.  Musculoskeletal:        General: No tenderness.     Cervical back: Normal range of motion  and neck supple.     Right lower leg: No edema.     Left lower leg: No edema.  Skin:    General: Skin is warm and dry.  Neurological:     Mental Status: He is alert and oriented to person, place, and time. Mental status is at baseline.     Labs reviewed: Basic Metabolic Panel: Recent Labs    06/03/22 0807 07/09/22 0822 10/04/22 0000 12/14/22 1050  NA 141 142 141 140  K 4.6 4.2 4.6 4.1  CL 109 110 108 106  CO2 24 21 26* 25  GLUCOSE 136* 132*  --  156*  BUN 45* 38* 26* 33*  CREATININE 1.96* 1.81* 1.4* 1.35*  CALCIUM 8.5* 8.7 9.1 8.6   Liver Function Tests: Recent Labs    06/03/22 0807 10/04/22 0000 12/14/22 1050  AST 13 14 15   ALT 11 12 11   ALKPHOS  --  68  --   BILITOT 0.3  --  0.4  PROT 5.9*  --  5.9*  ALBUMIN  --  3.6  --    No results for input(s): "LIPASE", "AMYLASE" in the last 8760 hours. No results for input(s): "AMMONIA" in the last 8760 hours. CBC: Recent Labs    10/04/22 0000 12/14/22 1050  WBC 8.6 8.0  NEUTROABS  --  5,920  HGB 10.7* 10.9*  HCT 31* 32.8*  MCV  --  85.4  PLT 194 168   Lipid Panel: Recent Labs    10/15/22 0855  CHOL 96  HDL 45  LDLCALC 35  TRIG 78  CHOLHDL 2.1   TSH: No results for input(s): "TSH" in the last 8760 hours. A1C: Lab Results  Component Value Date   HGBA1C 6.3 (H) 10/15/2022     Assessment/Plan 1. Stage 3b chronic kidney disease (HCC) -Chronic and stable Encourage proper hydration Follow metabolic panel Avoid nephrotoxic meds (NSAIDS) -  COMPLETE METABOLIC PANEL WITH GFR  2. Essential hypertension, benign -Blood pressure well controlled, goal bp <140/90 Continue current medications and dietary modifications follow metabolic panel - COMPLETE METABOLIC PANEL WITH GFR - CBC with Differential/Platelet  3. DM type 2 with diabetic peripheral neuropathy (HCC) -not on medication, continues with dietary compliance, routine foot care/monitoring and to keep up with diabetic eye exams through  ophthalmology  - Hemoglobin A1c  4. Iron deficiency anemia, unspecified iron deficiency anemia type -iron levels normal on labs by oncologist, continues on iron twice daily   5. Mixed hyperlipidemia -cholesterol at goal. Continue current regimen.  6. Congestive dilated cardiomyopathy (HCC) Euvolemic, continues on metoprolol   7. Malignant neoplasm of prostate (HCC) Without worsening of symptoms, continues with urology follow up  8. Mild protein-calorie malnutrition (HCC) -eating more nuts to increase protein, will follow up labs  9. Class 2 severe obesity due to excess calories with serious comorbidity and body mass index (BMI) of 37.0 to 37.9 in adult [facility]) --education provided on healthy weight loss through increase in physical activity and proper nutrition   10. Chronic gout without tophus, unspecified cause, unspecified site -no recent flares, continues on allopurinol 200 mg daily - Uric Acid   Return in about 6 months (around 10/18/2023) for routine follow up. Janene Harvey. Biagio Borg [facility] [phone]

## 2023-04-19 LAB — COMPLETE METABOLIC PANEL WITH GFR
AG Ratio: 1.9 (calc) (ref 1.0–2.5)
ALT: 19 U/L (ref 9–46)
AST: 19 U/L (ref 10–35)
Albumin: 3.7 g/dL (ref 3.6–5.1)
Alkaline phosphatase (APISO): 65 U/L (ref 35–144)
BUN/Creatinine Ratio: 20 (calc) (ref 6–22)
BUN: 36 mg/dL — ABNORMAL HIGH (ref 7–25)
CO2: 26 mmol/L (ref 20–32)
Calcium: 9.2 mg/dL (ref 8.6–10.3)
Chloride: 109 mmol/L (ref 98–110)
Creat: 1.76 mg/dL — ABNORMAL HIGH (ref 0.70–1.22)
Globulin: 2 g/dL (calc) (ref 1.9–3.7)
Glucose, Bld: 145 mg/dL — ABNORMAL HIGH (ref 65–99)
Potassium: 4.5 mmol/L (ref 3.5–5.3)
Sodium: 143 mmol/L (ref 135–146)
Total Bilirubin: 0.3 mg/dL (ref 0.2–1.2)
Total Protein: 5.7 g/dL — ABNORMAL LOW (ref 6.1–8.1)
eGFR: 37 mL/min/{1.73_m2} — ABNORMAL LOW (ref >=60)

## 2023-04-19 LAB — CBC WITH DIFFERENTIAL/PLATELET
Absolute Monocytes: 418 cells/uL (ref 200–950)
Basophils Absolute: 43 cells/uL (ref 0–200)
Basophils Relative: 0.6 %
Eosinophils Absolute: 151 cells/uL (ref 15–500)
Eosinophils Relative: 2.1 %
HCT: 32 % — ABNORMAL LOW (ref 38.5–50.0)
Hemoglobin: 10.3 g/dL — ABNORMAL LOW (ref 13.2–17.1)
Lymphs Abs: 1850 cells/uL (ref 850–3900)
MCH: 27.7 pg (ref 27.0–33.0)
MCHC: 32.2 g/dL (ref 32.0–36.0)
MCV: 86 fL (ref 80.0–100.0)
MPV: 9.3 fL (ref 7.5–12.5)
Monocytes Relative: 5.8 %
Neutro Abs: 4738 cells/uL (ref 1500–7800)
Neutrophils Relative %: 65.8 %
Platelets: 183 10*3/uL (ref 140–400)
RBC: 3.72 10*6/uL — ABNORMAL LOW (ref 4.20–5.80)
RDW: 15.3 % — ABNORMAL HIGH (ref 11.0–15.0)
Total Lymphocyte: 25.7 %
WBC: 7.2 10*3/uL (ref 3.8–10.8)

## 2023-04-19 LAB — URIC ACID: Uric Acid, Serum: 6.6 mg/dL (ref 4.0–8.0)

## 2023-04-19 LAB — HEMOGLOBIN A1C
Hgb A1c MFr Bld: 6.6 % of total Hgb — ABNORMAL HIGH (ref <5.7)
Mean Plasma Glucose: 143 mg/dL
eAG (mmol/L): 7.9 mmol/L

## 2023-05-09 ENCOUNTER — Other Ambulatory Visit: Payer: Self-pay | Admitting: Nurse Practitioner

## 2023-05-09 ENCOUNTER — Ambulatory Visit (INDEPENDENT_AMBULATORY_CARE_PROVIDER_SITE_OTHER): Payer: Medicare Other | Admitting: Podiatry

## 2023-05-09 ENCOUNTER — Encounter: Payer: Self-pay | Admitting: Podiatry

## 2023-05-09 DIAGNOSIS — E0843 Diabetes mellitus due to underlying condition with diabetic autonomic (poly)neuropathy: Secondary | ICD-10-CM | POA: Diagnosis not present

## 2023-05-09 DIAGNOSIS — B351 Tinea unguium: Secondary | ICD-10-CM

## 2023-05-09 DIAGNOSIS — I517 Cardiomegaly: Secondary | ICD-10-CM

## 2023-05-09 DIAGNOSIS — I10 Essential (primary) hypertension: Secondary | ICD-10-CM

## 2023-05-09 DIAGNOSIS — M79674 Pain in right toe(s): Secondary | ICD-10-CM | POA: Diagnosis not present

## 2023-05-09 DIAGNOSIS — M79675 Pain in left toe(s): Secondary | ICD-10-CM | POA: Diagnosis not present

## 2023-05-09 NOTE — Progress Notes (Signed)
This patient returns to my office for at risk foot care.  This patient requires this care by a professional since this patient will be at risk due to having diabetes with neuropathy.  This patient is unable to cut nails himself since the patient cannot reach his nails.These nails are painful walking and wearing shoes.  This patient presents for at risk foot care today.  General Appearance  Alert, conversant and in no acute stress.  Vascular  Dorsalis pedis and posterior tibial  pulses are palpable  bilaterally.  Capillary return is within normal limits  bilaterally. Temperature is within normal limits  bilaterally.  Neurologic  Senn-Weinstein monofilament wire test within normal limits  bilaterally. Muscle power within normal limits bilaterally.  Nails Thick disfigured discolored nails with subungual debris  from hallux to fifth toes bilaterally. No evidence of bacterial infection or drainage bilaterally.  Orthopedic  No limitations of motion  feet .  No crepitus or effusions noted.  No bony pathology or digital deformities noted.  Skin  normotropic skin with no porokeratosis noted bilaterally.  No signs of infections or ulcers noted.     Onychomycosis  Pain in right toes  Pain in left toes  Consent was obtained for treatment procedures.   Mechanical debridement of nails 1-5  bilaterally performed with a nail nipper.  Filed with dremel without incident.    Return office visit     3 months                Told patient to return for periodic foot care and evaluation due to potential at risk complications.   Tayvia Faughnan DPM  

## 2023-05-09 NOTE — Telephone Encounter (Signed)
Patient medications have warnings. Medications pend and sent to PCP [name], NP.

## 2023-08-08 ENCOUNTER — Encounter: Payer: Self-pay | Admitting: Podiatry

## 2023-08-08 ENCOUNTER — Ambulatory Visit (INDEPENDENT_AMBULATORY_CARE_PROVIDER_SITE_OTHER): Payer: Medicare Other | Admitting: Podiatry

## 2023-08-08 DIAGNOSIS — M79674 Pain in right toe(s): Secondary | ICD-10-CM

## 2023-08-08 DIAGNOSIS — E0843 Diabetes mellitus due to underlying condition with diabetic autonomic (poly)neuropathy: Secondary | ICD-10-CM | POA: Diagnosis not present

## 2023-08-08 DIAGNOSIS — B351 Tinea unguium: Secondary | ICD-10-CM

## 2023-08-08 DIAGNOSIS — M79675 Pain in left toe(s): Secondary | ICD-10-CM

## 2023-08-08 NOTE — Progress Notes (Signed)
This patient returns to my office for at risk foot care.  This patient requires this care by a professional since this patient will be at risk due to having diabetes with neuropathy.  This patient is unable to cut nails himself since the patient cannot reach his nails.These nails are painful walking and wearing shoes.  This patient presents for at risk foot care today.  General Appearance  Alert, conversant and in no acute stress.  Vascular  Dorsalis pedis and posterior tibial  pulses are palpable  bilaterally.  Capillary return is within normal limits  bilaterally. Temperature is within normal limits  bilaterally.  Neurologic  Senn-Weinstein monofilament wire test within normal limits  bilaterally. Muscle power within normal limits bilaterally.  Nails Thick disfigured discolored nails with subungual debris  from hallux to fifth toes bilaterally. No evidence of bacterial infection or drainage bilaterally.  Orthopedic  No limitations of motion  feet .  No crepitus or effusions noted.  No bony pathology or digital deformities noted.  Skin  normotropic skin with no porokeratosis noted bilaterally.  No signs of infections or ulcers noted.     Onychomycosis  Pain in right toes  Pain in left toes  Consent was obtained for treatment procedures.   Mechanical debridement of nails 1-5  bilaterally performed with a nail nipper.  Filed with dremel without incident.    Return office visit     3 months                Told patient to return for periodic foot care and evaluation due to potential at risk complications.   Helane Gunther DPM

## 2023-08-11 ENCOUNTER — Other Ambulatory Visit: Payer: Self-pay | Admitting: Cardiology

## 2023-08-23 ENCOUNTER — Ambulatory Visit (HOSPITAL_COMMUNITY): Payer: Medicare Other | Attending: Cardiology

## 2023-08-23 DIAGNOSIS — I10 Essential (primary) hypertension: Secondary | ICD-10-CM | POA: Insufficient documentation

## 2023-08-23 DIAGNOSIS — I350 Nonrheumatic aortic (valve) stenosis: Secondary | ICD-10-CM | POA: Diagnosis present

## 2023-08-23 LAB — ECHOCARDIOGRAM COMPLETE
AR max vel: 1.58 cm2
AV Area VTI: 1.57 cm2
AV Area mean vel: 1.41 cm2
AV Mean grad: 20.4 mm[Hg]
AV Peak grad: 32.1 mm[Hg]
Ao pk vel: 2.83 m/s
Area-P 1/2: 2.85 cm2
S' Lateral: 2.6 cm

## 2023-08-24 ENCOUNTER — Other Ambulatory Visit: Payer: Self-pay

## 2023-08-24 DIAGNOSIS — I350 Nonrheumatic aortic (valve) stenosis: Secondary | ICD-10-CM

## 2023-08-27 ENCOUNTER — Other Ambulatory Visit: Payer: Self-pay | Admitting: Nurse Practitioner

## 2023-08-27 DIAGNOSIS — I10 Essential (primary) hypertension: Secondary | ICD-10-CM

## 2023-08-27 DIAGNOSIS — M109 Gout, unspecified: Secondary | ICD-10-CM

## 2023-09-07 ENCOUNTER — Other Ambulatory Visit: Payer: Self-pay | Admitting: Cardiology

## 2023-10-01 ENCOUNTER — Other Ambulatory Visit: Payer: Self-pay | Admitting: Cardiology

## 2023-10-06 ENCOUNTER — Encounter: Payer: Self-pay | Admitting: Podiatry

## 2023-10-21 ENCOUNTER — Encounter: Payer: Self-pay | Admitting: Nurse Practitioner

## 2023-10-21 ENCOUNTER — Ambulatory Visit (INDEPENDENT_AMBULATORY_CARE_PROVIDER_SITE_OTHER): Payer: Medicare Other | Admitting: Nurse Practitioner

## 2023-10-21 VITALS — BP 132/80 | HR 70 | Temp 97.3°F | Ht 71.0 in | Wt 274.0 lb

## 2023-10-21 DIAGNOSIS — Z23 Encounter for immunization: Secondary | ICD-10-CM | POA: Diagnosis not present

## 2023-10-21 DIAGNOSIS — E782 Mixed hyperlipidemia: Secondary | ICD-10-CM

## 2023-10-21 DIAGNOSIS — E1142 Type 2 diabetes mellitus with diabetic polyneuropathy: Secondary | ICD-10-CM

## 2023-10-21 DIAGNOSIS — I10 Essential (primary) hypertension: Secondary | ICD-10-CM

## 2023-10-21 DIAGNOSIS — M1A9XX0 Chronic gout, unspecified, without tophus (tophi): Secondary | ICD-10-CM | POA: Diagnosis not present

## 2023-10-21 DIAGNOSIS — C61 Malignant neoplasm of prostate: Secondary | ICD-10-CM

## 2023-10-21 DIAGNOSIS — I420 Dilated cardiomyopathy: Secondary | ICD-10-CM

## 2023-10-21 DIAGNOSIS — D509 Iron deficiency anemia, unspecified: Secondary | ICD-10-CM

## 2023-10-21 DIAGNOSIS — N1832 Chronic kidney disease, stage 3b: Secondary | ICD-10-CM | POA: Diagnosis not present

## 2023-10-21 MED ORDER — FERROUS SULFATE 325 (65 FE) MG PO TBEC
325.0000 mg | DELAYED_RELEASE_TABLET | Freq: Every day | ORAL | Status: DC
Start: 2023-10-21 — End: 2024-09-05

## 2023-10-21 MED ORDER — ONETOUCH VERIO VI STRP: ORAL_STRIP | 3 refills | Status: AC

## 2023-10-21 NOTE — Progress Notes (Signed)
Careteam: Patient Care Team: [name], NP as PCP - General (Geriatric Medicine) Jake Bathe, MD as PCP - Cardiology (Cardiology) Esaw Dace, MD as Attending Physician (Urology) Elise Benne, MD as Consulting Physician (Ophthalmology)  PLACE OF SERVICE:  [facility]  Advanced Directive information Does Patient Have a Medical Advance Directive?: Yes, Type of Advance Directive: Out of facility DNR (pink MOST or yellow form), Pre-existing out of facility DNR order (yellow form or pink MOST form): Pink MOST form placed in chart (order not valid for inpatient use), Does patient want to make changes to medical advance directive?: No - Patient declined  Allergies  Allergen Reactions   Abiraterone Nausea And Vomiting and Other (See Comments)    Other reaction(s): Increased Heart Rate (intolerance)     Chief Complaint  Patient presents with   Medical Management of Chronic Issues    6 month follow-up. Discuss need for shingrix, covid, flu, and A1c. AWV pending for 10/31/23. [facility] verified.      HPI: Patient is a 89 y.o. male for routine follow up.   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  Motivates him to get up and do something. He does not have any back up so he goes every day.   Continue to follow up with urologist- no changes in frequency or flow.  Has stent exchange every 3 months due to ureteral obstruction.   Uric acid level at goal. No recent flares  DM- diet controlled, check blood sugars at home occasionally  Anemia- continues on iron supplement every other day- iron level low last month.   No constipation.   Hypertension- controlled on current regimen.   Review of Systems:  Review of Systems  Constitutional:  Negative for chills, fever and weight loss.  HENT:  Negative for tinnitus.   Respiratory:  Negative for cough, sputum production and shortness of breath.   Cardiovascular:  Negative for chest  pain, palpitations and leg swelling.  Gastrointestinal:  Negative for abdominal pain, constipation, diarrhea and heartburn.  Genitourinary:  Negative for dysuria, frequency and urgency.  Musculoskeletal:  Positive for back pain and joint pain. Negative for falls and myalgias.  Skin: Negative.   Neurological:  Positive for tingling. Negative for dizziness and headaches.  Psychiatric/Behavioral:  Negative for depression and memory loss. The patient does not have insomnia.     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/28/2006   Bone metastases    left mid tibial shaft   Cataract    Dr.Groat   Cellulitis and abscess of leg, except foot 01/17/2012   Chest pain, unspecified 04/28/2004   Closed fracture of five ribs 03/24/2004   First degree atrioventricular block 04/25/2007   Gross hematuria 09/06/2011   History of radiation therapy 04/03/14- 04/17/14   mid to distal left tibia 3000 cGy in 10 sessions   HTN (hypertension)    Hx of radiation therapy 11/1998   prostate fossa - 6040 cGy, 33 fx, Dr Dan Humphreys   Osteoarthrosis involving, or with mention of more than one site, but not specified as generalized, multiple sites 10/22/2010   Other abnormal blood chemistry 04/29/1991   Other and unspecified hyperlipidemia 01/02/2013   Palpitations 04/28/2004   Prostate cancer (HCC) 12/16/1994   gleason 7   Reflux esophagitis 05/10/2008   Routine general medical examination at a health care facility    Spinal stenosis, unspecified region  other than cervical 04/29/1995   Type II or unspecified type diabetes mellitus without mention of complication, uncontrolled    Past Surgical History:  Procedure Laterality Date   AIR/FLUID EXCHANGE Left 01/09/2016   Procedure: AIR/FLUID EXCHANGE;  Surgeon: Carmela Rima, MD;  Location[facility] OR;  Service: Ophthalmology;  Laterality: Left;   CYSTOSCOPY W/ URETERAL STENT PLACEMENT  12/31/14   CYSTOSCOPY W/ URETERAL STENT  PLACEMENT  05/16/2015   EYE SURGERY  2006   cataract, Dr Katrina Stack   LASER PHOTO ABLATION Left 01/09/2016   Procedure: LASER PHOTO ABLATION;  Surgeon: Carmela Rima, MD;  Location: [facility] OR;  Service: Ophthalmology;  Laterality: Left;  Endolaser   Left ureteral stent placement  Week of 12/05/11   PARS PLANA VITRECTOMY Left 01/09/2016   Procedure: PARS PLANA VITRECTOMY WITH 25 GAUGE LEFT EYE ;  Surgeon: Carmela Rima, MD;  Location: [facility] OR;  Service: Ophthalmology;  Laterality: Left;   PERFLUORONE INJECTION Left 01/09/2016   Procedure: PERFLUORONE INJECTION;  Surgeon: Carmela Rima, MD;  Location: [facility] OR;  Service: Ophthalmology;  Laterality: Left;   PROSTATECTOMY  1996   Dr. Earlene Plater   URETERAL STENT PLACEMENT  05/2020   [facility] every 3 months    URETERAL STENT PLACEMENT  09/23/2020   Patient gets replaced every 3 months    Social History:   reports that he quit smoking about 54 years ago. His smoking use included cigars. He has never used smokeless tobacco. He reports that he does not drink alcohol and does not use drugs.  Family History  Problem Relation Age of Onset   Heart disease Father    Stroke Sister     Medications: Patient's Medications  New Prescriptions   No medications on file  Previous Medications   ACETAMINOPHEN (TYLENOL) 650 MG CR TABLET    Take 650 mg by mouth as needed for pain.   ALLOPURINOL (ZYLOPRIM) 100 MG TABLET    TAKE 2 TABLETS BY MOUTH EVERY DAY   ASPIRIN 81 MG TABLET    Take 1 tablet (81 mg total) by mouth daily.   CALCIUM CARB-CHOLECALCIFEROL 600-5 MG-MCG TABS    Take 2 tablets by mouth daily.   CALCIUM CARBONATE (TUMS - DOSED IN MG ELEMENTAL CALCIUM) 500 MG CHEWABLE TABLET    Chew 1 tablet by mouth as needed for indigestion or heartburn.   CHOLECALCIFEROL 1000 UNITS TABLET    Take 1,000 Units by mouth daily.   DOXAZOSIN (CARDURA) 4 MG TABLET    TAKE 1 TABLET BY MOUTH EVERY DAY   FERROUS SULFATE 325 (65 FE) MG EC TABLET    Take 325 mg by mouth every other day.    GLUCOSE BLOOD (ONETOUCH VERIO) TEST STRIP    Check blood sugar one to two times a week. Dx:E11.29   LEUPROLIDE, 6 MONTH, (ELIGARD) 45 MG INJECTION    Inject 45 mg into the skin every 6 (six) months. Filled by [facility]   LISINOPRIL-HYDROCHLOROTHIAZIDE (ZESTORETIC) 20-12.5 MG TABLET    TAKE 2 TABLETS BY MOUTH EVERY MORNING   METOPROLOL TARTRATE (LOPRESSOR) 50 MG TABLET    TAKE 1 TABLET BY MOUTH TWICE A DAY   OMEGA-3 FATTY ACIDS (FISH OIL) 1000 MG CAPS    Take 1 capsule by mouth daily.   PYRIDOXINE (B-6) 100 MG TABLET    Take 100 mg by mouth daily.   ROSUVASTATIN (CRESTOR) 20 MG TABLET    TAKE 1 TABLET BY MOUTH EVERY DAY  Modified Medications   No medications on file  Discontinued Medications   No medications on file    Physical Exam:  Vitals:   10/21/23 0816  BP: 132/80  Pulse: 70  Temp: (!) 97.3 F (36.3 C)  TempSrc: Temporal  SpO2: 97%  Weight: 274 lb (124.3 kg)  Height: 5\' 11"  (1.803 m)   Body mass index is 38.22 kg/m. Wt Readings from Last 3 Encounters:  10/21/23 274 lb (124.3 kg)  04/18/23 271 lb (122.9 kg)  12/14/22 271 lb 3.2 oz (123 kg)    Physical Exam Constitutional:      General: He is not in acute distress.    Appearance: He is well-developed. He is not diaphoretic.  HENT:     Head: Normocephalic and atraumatic.     Right Ear: External ear normal.     Left Ear: External ear normal.     Mouth/Throat:     Pharynx: No oropharyngeal exudate.  Eyes:     Conjunctiva/sclera: Conjunctivae normal.     Pupils: Pupils are equal, round, and reactive to light.  Cardiovascular:     Rate and Rhythm: Normal rate and regular rhythm.     Heart sounds: Normal heart sounds.  Pulmonary:     Effort: Pulmonary effort is normal.     Breath sounds: Normal breath sounds.  Abdominal:     General: Bowel sounds are normal.     Palpations: Abdomen is soft.  Musculoskeletal:        General: No tenderness.     Cervical back: Normal range of motion and neck supple.      Right lower leg: No edema.     Left lower leg: No edema.  Skin:    General: Skin is warm and dry.  Neurological:     Mental Status: He is alert and oriented to person, place, and time.     Labs reviewed: Basic Metabolic Panel: Recent Labs    12/14/22 1050 04/18/23 0837  NA 140 143  K 4.1 4.5  CL 106 109  CO2 25 26  GLUCOSE 156* 145*  BUN 33* 36*  CREATININE 1.35* 1.76*  CALCIUM 8.6 9.2   Liver Function Tests: Recent Labs    12/14/22 1050 04/18/23 0837  AST 15 19  ALT 11 19  BILITOT 0.4 0.3  PROT 5.9* 5.7*   No results for input(s): "LIPASE", "AMYLASE" in the last 8760 hours. No results for input(s): "AMMONIA" in the last 8760 hours. CBC: Recent Labs    12/14/22 1050 04/18/23 0837  WBC 8.0 7.2  NEUTROABS 5,920 4,738  HGB 10.9* 10.3*  HCT 32.8* 32.0*  MCV 85.4 86.0  PLT 168 183   Lipid Panel: No results for input(s): "CHOL", "HDL", "LDLCALC", "TRIG", "CHOLHDL", "LDLDIRECT" in the last 8760 hours. TSH: No results for input(s): "TSH" in the last 8760 hours. A1C: Lab Results  Component Value Date   HGBA1C 6.6 (H) 04/18/2023     Assessment/Plan 1. Iron deficiency anemia, unspecified iron deficiency anemia type Iron studies reveal iron def, to increase iron to daily and to take with meal - ferrous sulfate 325 (65 FE) MG EC tablet; Take 1 tablet (325 mg total) by mouth daily with breakfast.  2. Mixed hyperlipidemia -continues on crestor, dietary modifications encouraged - Lipid panel  3. Stage 3b chronic kidney disease (HCC) -Chronic and stable Encourage proper hydration Follow metabolic panel Avoid nephrotoxic meds (NSAIDS)  4. Chronic gout without tophus, unspecified cause, unspecified site -no recent flares, uric acid at goal on last labs  5. Essential hypertension, benign -Blood  pressure well controlled, goal bp <140/90 Continue current medications and dietary modifications follow metabolic panel - COMPLETE METABOLIC PANEL WITH GFR - CBC  with Differential/Platelet  6. Need for influenza vaccination - Flu Vaccine Trivalent High Dose (Fluad)  7. DM type 2 with diabetic peripheral neuropathy (HCC) -diet controlled, continue dietary compliance, routine foot care/monitoring and to keep up with diabetic eye exams through ophthalmology  - glucose blood (ONETOUCH VERIO) test strip; Check blood sugar one to two times a week. Dx:E11.29  Dispense: 100 each; Refill: 3 - Hemoglobin A1c  8. Malignant neoplasm of prostate (HCC) Continues to follow up with urology No changes in frequency or flow.   9. Congestive dilated cardiomyopathy (HCC) Euvolemic today, continues on metoprolol with lisinopril-hctz   Return in about 6 months (around 04/20/2024) for routine follow up.   Janene Harvey. [facility] [phone]

## 2023-10-21 NOTE — Patient Instructions (Addendum)
Increase iron supplement daily   For pain can take tylenol 1000 mg by mouth every 8 hours as needed for pain

## 2023-10-22 LAB — CBC WITH DIFFERENTIAL/PLATELET
Absolute Lymphocytes: 2075 {cells}/uL (ref 850–3900)
Absolute Monocytes: 484 {cells}/uL (ref 200–950)
Basophils Absolute: 49 {cells}/uL (ref 0–200)
Basophils Relative: 0.6 %
Eosinophils Absolute: 197 {cells}/uL (ref 15–500)
Eosinophils Relative: 2.4 %
HCT: 30.4 % — ABNORMAL LOW (ref 38.5–50.0)
Hemoglobin: 9.7 g/dL — ABNORMAL LOW (ref 13.2–17.1)
MCH: 28 pg (ref 27.0–33.0)
MCHC: 31.9 g/dL — ABNORMAL LOW (ref 32.0–36.0)
MCV: 87.9 fL (ref 80.0–100.0)
MPV: 10.2 fL (ref 7.5–12.5)
Monocytes Relative: 5.9 %
Neutro Abs: 5396 {cells}/uL (ref 1500–7800)
Neutrophils Relative %: 65.8 %
Platelets: 167 10*3/uL (ref 140–400)
RBC: 3.46 10*6/uL — ABNORMAL LOW (ref 4.20–5.80)
RDW: 14.1 % (ref 11.0–15.0)
Total Lymphocyte: 25.3 %
WBC: 8.2 10*3/uL (ref 3.8–10.8)

## 2023-10-22 LAB — COMPLETE METABOLIC PANEL WITH GFR
AG Ratio: 1.6 (calc) (ref 1.0–2.5)
ALT: 11 U/L (ref 9–46)
AST: 15 U/L (ref 10–35)
Albumin: 3.6 g/dL (ref 3.6–5.1)
Alkaline phosphatase (APISO): 66 U/L (ref 35–144)
BUN/Creatinine Ratio: 22 (calc) (ref 6–22)
BUN: 37 mg/dL — ABNORMAL HIGH (ref 7–25)
CO2: 26 mmol/L (ref 20–32)
Calcium: 9.1 mg/dL (ref 8.6–10.3)
Chloride: 109 mmol/L (ref 98–110)
Creat: 1.67 mg/dL — ABNORMAL HIGH (ref 0.70–1.22)
Globulin: 2.2 g/dL (ref 1.9–3.7)
Glucose, Bld: 129 mg/dL — ABNORMAL HIGH (ref 65–99)
Potassium: 4.7 mmol/L (ref 3.5–5.3)
Sodium: 142 mmol/L (ref 135–146)
Total Bilirubin: 0.3 mg/dL (ref 0.2–1.2)
Total Protein: 5.8 g/dL — ABNORMAL LOW (ref 6.1–8.1)
eGFR: 39 mL/min/{1.73_m2} — ABNORMAL LOW (ref >=60)

## 2023-10-22 LAB — HEMOGLOBIN A1C
Hgb A1c MFr Bld: 6.1 %{Hb} — ABNORMAL HIGH (ref <5.7)
Mean Plasma Glucose: 128 mg/dL
eAG (mmol/L): 7.1 mmol/L

## 2023-10-22 LAB — LIPID PANEL
Cholesterol: 98 mg/dL (ref <200)
HDL: 44 mg/dL (ref >=40)
LDL Cholesterol (Calc): 40 mg/dL
Non-HDL Cholesterol (Calc): 54 mg/dL (ref <130)
Total CHOL/HDL Ratio: 2.2 (calc) (ref <5.0)
Triglycerides: 68 mg/dL (ref <150)

## 2023-10-26 ENCOUNTER — Ambulatory Visit: Payer: Medicare Other | Admitting: Podiatry

## 2023-10-27 ENCOUNTER — Encounter: Payer: Self-pay | Admitting: Podiatry

## 2023-10-27 ENCOUNTER — Other Ambulatory Visit: Payer: Self-pay | Admitting: Cardiology

## 2023-10-27 ENCOUNTER — Ambulatory Visit (INDEPENDENT_AMBULATORY_CARE_PROVIDER_SITE_OTHER): Payer: Medicare Other | Admitting: Podiatry

## 2023-10-27 DIAGNOSIS — M79674 Pain in right toe(s): Secondary | ICD-10-CM

## 2023-10-27 DIAGNOSIS — E0843 Diabetes mellitus due to underlying condition with diabetic autonomic (poly)neuropathy: Secondary | ICD-10-CM | POA: Diagnosis not present

## 2023-10-27 DIAGNOSIS — M79675 Pain in left toe(s): Secondary | ICD-10-CM

## 2023-10-27 DIAGNOSIS — B351 Tinea unguium: Secondary | ICD-10-CM | POA: Diagnosis not present

## 2023-10-27 NOTE — Progress Notes (Signed)
This patient returns to my office for at risk foot care.  This patient requires this care by a professional since this patient will be at risk due to having diabetes with neuropathy.  This patient is unable to cut nails himself since the patient cannot reach his nails.These nails are painful walking and wearing shoes.  This patient presents for at risk foot care today.  General Appearance  Alert, conversant and in no acute stress.  Vascular  Dorsalis pedis and posterior tibial  pulses are palpable  bilaterally.  Capillary return is within normal limits  bilaterally. Temperature is within normal limits  bilaterally.  Neurologic  Senn-Weinstein monofilament wire test within normal limits  bilaterally. Muscle power within normal limits bilaterally.  Nails Thick disfigured discolored nails with subungual debris  from hallux to fifth toes bilaterally. No evidence of bacterial infection or drainage bilaterally.  Orthopedic  No limitations of motion  feet .  No crepitus or effusions noted.  No bony pathology or digital deformities noted.  Skin  normotropic skin with no porokeratosis noted bilaterally.  No signs of infections or ulcers noted.     Onychomycosis  Pain in right toes  Pain in left toes  Consent was obtained for treatment procedures.   Mechanical debridement of nails 1-5  bilaterally performed with a nail nipper.  Filed with dremel without incident.    Return office visit     3 months                Told patient to return for periodic foot care and evaluation due to potential at risk complications.   Macala Baldonado DPM  

## 2023-10-31 ENCOUNTER — Encounter: Payer: Self-pay | Admitting: Nurse Practitioner

## 2023-10-31 ENCOUNTER — Ambulatory Visit (INDEPENDENT_AMBULATORY_CARE_PROVIDER_SITE_OTHER): Payer: Medicare Other | Admitting: Nurse Practitioner

## 2023-10-31 VITALS — BP 130/78 | HR 57 | Temp 96.8°F | Resp 20 | Ht 71.0 in | Wt 273.4 lb

## 2023-10-31 DIAGNOSIS — Z0000 Encounter for general adult medical examination without abnormal findings: Secondary | ICD-10-CM | POA: Diagnosis not present

## 2023-10-31 NOTE — Patient Instructions (Addendum)
  Mr. Borg , Thank you for taking time to come for your Medicare Wellness Visit. I appreciate your ongoing commitment to your health goals. Please review the following plan we discussed and let me know if I can assist you in the future.    This is a list of the screening recommended for you and due dates:  Health Maintenance  Topic Date Due   Zoster (Shingles) Vaccine (1 of 2) Never done   COVID-19 Vaccine (3 - Pfizer risk series) 01/20/2020   Complete foot exam   11/04/2023   DTaP/Tdap/Td vaccine (2 - Td or Tdap) 11/08/2023   Eye exam for diabetics  11/24/2023   Hemoglobin A1C  04/20/2024   Medicare Annual Wellness Visit  10/30/2024   Pneumonia Vaccine  Completed   Flu Shot  Completed   HPV Vaccine  Aged Out   Due for SHINGLES and COVID BOOSTER to get at local pharmacy

## 2023-10-31 NOTE — Progress Notes (Signed)
Subjective:   Jeremiah Collins is a 89 y.o. male who presents for Medicare Annual/Subsequent preventive examination.  Visit Complete: In person [facility]  Cardiac Risk Factors include: advanced age (>73men, >20 women);diabetes mellitus;hypertension;male gender;obesity (BMI >30kg/m2)     Objective:    Today's Vitals   10/31/23 0951  BP: 130/78  Pulse: (!) 57  Resp: 20  Temp: (!) 96.8 F (36 C)  SpO2: 96%  Weight: 273 lb 6.4 oz (124 kg)  Height: 5\' 11"  (1.803 m)   Body mass index is 38.13 kg/m.     10/31/2023    9:39 AM 10/21/2023    8:19 AM 04/18/2023    8:16 AM 12/14/2022   10:00 AM 10/05/2022    9:01 AM 06/11/2022    8:51 AM 02/05/2022    8:59 AM  Advanced Directives  Does Patient Have a Medical Advance Directive? Yes Yes Yes No Yes Yes Yes  Type of Advance Directive Out of facility DNR (pink MOST or yellow form) Out of facility DNR (pink MOST or yellow form) Out of facility DNR (pink MOST or yellow form)  Out of facility DNR (pink MOST or yellow form) Out of facility DNR (pink MOST or yellow form) Out of facility DNR (pink MOST or yellow form)  Does patient want to make changes to medical advance directive? No - Patient declined No - Patient declined No - Patient declined No - Patient declined Yes (ED - send information to MyChart) No - Patient declined No - Patient declined  Pre-existing out of facility DNR order (yellow form or pink MOST form) Pink MOST form placed in chart (order not valid for inpatient use) Pink MOST form placed in chart (order not valid for inpatient use)   Pink MOST form placed in chart (order not valid for inpatient use) Pink MOST form placed in chart (order not valid for inpatient use) Pink MOST form placed in chart (order not valid for inpatient use)    Current Medications (verified) Outpatient Encounter Medications as of 10/31/2023  Medication Sig   acetaminophen (TYLENOL) 500 MG tablet Take 1,000 mg by mouth every 8 (eight) hours as needed for mild pain  (pain score 1-3) or moderate pain (pain score 4-6).   allopurinol (ZYLOPRIM) 100 MG tablet TAKE 2 TABLETS BY MOUTH EVERY DAY   aspirin 81 MG tablet Take 1 tablet (81 mg total) by mouth daily.   Calcium Carb-Cholecalciferol 600-5 MG-MCG TABS Take 2 tablets by mouth daily.   calcium carbonate (TUMS - DOSED IN MG ELEMENTAL CALCIUM) 500 MG chewable tablet Chew 1 tablet by mouth as needed for indigestion or heartburn.   Cholecalciferol 1000 UNITS tablet Take 1,000 Units by mouth daily.   doxazosin (CARDURA) 4 MG tablet TAKE 1 TABLET BY MOUTH EVERY DAY   ferrous sulfate 325 (65 FE) MG EC tablet Take 1 tablet (325 mg total) by mouth daily with breakfast.   glucose blood (ONETOUCH VERIO) test strip Check blood sugar one to two times a week. Dx:E11.29   leuprolide, 6 Month, (ELIGARD) 45 MG injection Inject 45 mg into the skin every 6 (six) months. Filled by [facility]   lisinopril-hydrochlorothiazide (ZESTORETIC) 20-12.5 MG tablet TAKE 2 TABLETS BY MOUTH EVERY MORNING   metoprolol tartrate (LOPRESSOR) 50 MG tablet TAKE 1 TABLET BY MOUTH TWICE A DAY   Omega-3 Fatty Acids (FISH OIL) 1000 MG CAPS Take 1 capsule by mouth daily.   pyridoxine (B-6) 100 MG tablet Take 100 mg by mouth daily.   rosuvastatin (CRESTOR)  20 MG tablet Take 1 tablet (20 mg total) by mouth daily. PATIENT NEEDS APPOINTMENT FOR FURTHER REFILLS.   No facility-administered encounter medications on file as of 10/31/2023.    Allergies (verified) Abiraterone   History: 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/28/2006   Bone metastases    left mid tibial shaft   Cataract    Dr.Groat   Cellulitis and abscess of leg, except foot 01/17/2012   Chest pain, unspecified 04/28/2004   Closed fracture of five ribs 03/24/2004   First degree atrioventricular block 04/25/2007   Gross hematuria 09/06/2011   History of radiation therapy 04/03/14- 04/17/14   mid to distal left tibia 3000 cGy in  10 sessions   HTN (hypertension)    Hx of radiation therapy 11/1998   prostate fossa - 6040 cGy, 33 fx, Dr Dan Humphreys   Osteoarthrosis involving, or with mention of more than one site, but not specified as generalized, multiple sites 10/22/2010   Other abnormal blood chemistry 04/29/1991   Other and unspecified hyperlipidemia 01/02/2013   Palpitations 04/28/2004   Prostate cancer (HCC) 12/16/1994   gleason 7   Reflux esophagitis 05/10/2008   Routine general medical examination at a health care facility    Spinal stenosis, unspecified region other than cervical 04/29/1995   Type II or unspecified type diabetes mellitus without mention of complication, uncontrolled    Past Surgical History:  Procedure Laterality Date   AIR/FLUID EXCHANGE Left 01/09/2016   Procedure: AIR/FLUID EXCHANGE;  Surgeon: Carmela Rima, MD;  Location: [facility] OR;  Service: Ophthalmology;  Laterality: Left;   CYSTOSCOPY W/ URETERAL STENT PLACEMENT  12/31/14   CYSTOSCOPY W/ URETERAL STENT PLACEMENT  05/16/2015   EYE SURGERY  2006   cataract, Dr Katrina Stack   LASER PHOTO ABLATION Left 01/09/2016   Procedure: LASER PHOTO ABLATION;  Surgeon: Carmela Rima, MD;  Location: [facility] OR;  Service: Ophthalmology;  Laterality: Left;  Endolaser   Left ureteral stent placement  Week of 12/05/11   PARS PLANA VITRECTOMY Left 01/09/2016   Procedure: PARS PLANA VITRECTOMY WITH 25 GAUGE LEFT EYE ;  Surgeon: Carmela Rima, MD;  Location: [facility] OR;  Service: Ophthalmology;  Laterality: Left;   PERFLUORONE INJECTION Left 01/09/2016   Procedure: PERFLUORONE INJECTION;  Surgeon: Carmela Rima, MD;  Location: [facility] OR;  Service: Ophthalmology;  Laterality: Left;   PROSTATECTOMY  1996   Dr. Earlene Plater   URETERAL STENT PLACEMENT  05/2020   [facility] every 3 months    URETERAL STENT PLACEMENT  09/23/2020   Patient gets replaced every 3 months    Family History  Problem Relation Age of Onset   Heart disease Father    Stroke Sister    Social History    Socioeconomic History   Marital status: Married    Spouse name: Not on file   Number of children: Not on file   Years of education: Not on file   Highest education level: Not on file  Occupational History   Not on file  Tobacco Use   Smoking status: Former    Types: Cigars    Quit date: 12/16/1968    Years since quitting: 54.9   Smokeless tobacco: Never  Vaping Use   Vaping status: Never Used  Substance and Sexual Activity   Alcohol use: No    Alcohol/week: 0.0 standard drinks of alcohol   Drug use: No   Sexual activity: Never  Other Topics Concern   Not on file  Social  History Narrative   Not on file   Social Drivers of Health   Financial Resource Strain: Low Risk  (09/15/2018)   Overall Financial Resource Strain (CARDIA)    Difficulty of Paying Living Expenses: Not hard at all  Food Insecurity: No Food Insecurity (09/15/2018)   Hunger Vital Sign    Worried About Running Out of Food in the Last Year: Never true    Ran Out of Food in the Last Year: Never true  Transportation Needs: No Transportation Needs (09/15/2018)   PRAPARE - Administrator, Civil Service (Medical): No    Lack of Transportation (Non-Medical): No  Physical Activity: Inactive (09/15/2018)   Exercise Vital Sign    Days of Exercise per Week: 0 days    Minutes of Exercise per Session: 0 min  Stress: No Stress Concern Present (09/15/2018)   [employer] of Occupational Health - Occupational Stress Questionnaire    Feeling of Stress : Not at all  Social Connections: Somewhat Isolated (09/15/2018)   Social Connection and Isolation Panel [NHANES]    Frequency of Communication with Friends and Family: Three times a week    Frequency of Social Gatherings with Friends and Family: Once a week    Attends Religious Services: Never    Database administrator or Organizations: No    Attends Engineer, structural: Never    Marital Status: Married    Tobacco Counseling Counseling given:  Not Answered   Clinical Intake:  Pre-visit preparation completed: Yes  Pain : No/denies pain     BMI - recorded: 38.23 Nutritional Status: BMI > 30  Obese Nutritional Risks: None Diabetes: Yes CBG done?: No Did pt. bring in CBG monitor from home?: No  How often do you need to have someone help you when you read instructions, pamphlets, or other written materials from your doctor or pharmacy?: 1 - Never What is the last grade level you completed in school?: 12th grade  Interpreter Needed?: No  Information entered by :: Porsha McClurkin,CMA   Activities of Daily Living    10/31/2023    9:37 AM  In your present state of health, do you have any difficulty performing the following activities:  Hearing? 0  Vision? 0  Difficulty concentrating or making decisions? 0  Walking or climbing stairs? 1  Dressing or bathing? 0  Doing errands, shopping? 0  Preparing Food and eating ? N  Using the Toilet? N  In the past six months, have you accidently leaked urine? Y  Do you have problems with loss of bowel control? Y  Managing your Medications? N  Managing your Finances? N  Housekeeping or managing your Housekeeping? N    Patient Care Team: [name], NP as PCP - General (Geriatric Medicine) Jake Bathe, MD as PCP - Cardiology (Cardiology) Esaw Dace, MD as Attending Physician (Urology) Elise Benne, MD as Consulting Physician (Ophthalmology)  Indicate any recent Medical Services you may have received from other than [facility] providers in the past year (date may be approximate).     Assessment:   This is a routine wellness examination for Khush.  Hearing/Vision screen Hearing Screening - Comments:: No hearing concerns Vision Screening - Comments:: No vision concerns   Goals Addressed   None    Depression Screen    10/31/2023    9:40 AM 10/21/2023    8:31 AM 10/05/2022    8:58 AM 02/05/2022    9:26 AM 09/29/2021    8:57  AM 01/28/2021    9:31 AM  09/24/2020    8:18 AM  PHQ 2/9 Scores  PHQ - 2 Score 0 0 0 0 0 0 0    Fall Risk    10/31/2023    9:40 AM 10/21/2023    8:31 AM 04/18/2023    8:16 AM 12/14/2022   10:00 AM 10/15/2022    8:11 AM  Fall Risk   Falls in the past year? 0 0 0 0 0  Number falls in past yr: 0 0 0 0 0  Injury with Fall? 0  0 0 0  Risk for fall due to : No Fall Risks No Fall Risks No Fall Risks No Fall Risks No Fall Risks  Follow up Falls evaluation completed Falls evaluation completed Falls evaluation completed Falls evaluation completed Falls evaluation completed    MEDICARE RISK AT HOME: Medicare Risk at Home Any stairs in or around the home?: Yes If so, are there any without handrails?: No Home free of loose throw rugs in walkways, pet beds, electrical cords, etc?: Yes Adequate lighting in your home to reduce risk of falls?: Yes Life alert?: No Use of a cane, walker or w/c?: No Grab bars in the bathroom?: Yes Shower chair or bench in shower?: No Elevated toilet seat or a handicapped toilet?: Yes  TIMED UP AND GO:  Was the test performed?  No    Cognitive Function:    10/31/2023    9:40 AM 09/24/2019    8:45 AM 09/05/2017    9:15 AM 09/03/2016    9:14 AM 09/10/2014    8:39 AM  MMSE - Mini Mental State Exam  Orientation to time 5 4 5 5 5   Orientation to Place 5 5 5 5 5   Registration 3 3 3 3 3   Attention/ Calculation 5 3 4 5 5   Recall 2 2 1 3 3   Language- name 2 objects 2 2 2 2 2   Language- repeat 1 1 1 1 1   Language- follow 3 step command 3 2 3 3 3   Language- read & follow direction 1 1 1 1 1   Write a sentence 1 1 1  0 1  Copy design 1 1 1 1 1   Total score 29 25 27 29 30         10/05/2022    9:01 AM 09/29/2021    9:01 AM 09/24/2020    8:21 AM  6CIT Screen  What Year? 0 points 0 points 0 points  What month? 0 points 0 points 0 points  What time? 0 points 0 points 0 points  Count back from 20 2 points 0 points 0 points  Months in reverse 0 points 0 points 0 points  Repeat phrase 6  points 0 points 6 points  Total Score 8 points 0 points 6 points    Immunizations Immunization History  Administered Date(s) Administered   Fluad Quad(high Dose 65+) 07/30/2020, 08/07/2021, 10/15/2022   Fluad Trivalent(High Dose 65+) 10/21/2023   Influenza Split 08/13/2010, 08/16/2011, 07/18/2013, 08/20/2015   Influenza Whole 08/13/2010, 08/16/2011   Influenza, High Dose Seasonal PF 08/19/2016, 09/12/2018, 09/04/2019   Influenza, Quadrivalent, Recombinant, Inj, Pf 07/18/2013, 08/20/2015   Influenza,inj,Quad PF,6+ Mos 07/18/2013, 08/20/2015, 09/05/2017   Influenza-Unspecified 09/01/2016   PFIZER(Purple Top)SARS-COV-2 Vaccination 12/05/2019, 12/23/2019   Pneumococcal Conjugate-13 01/14/2015   Pneumococcal Polysaccharide-23 09/06/2011   Tdap 11/07/2013    TDAP status: Up to date  Flu Vaccine status: Up to date  Pneumococcal vaccine status: Up to date  Covid-19 vaccine  status: Information provided on how to obtain vaccines.   Qualifies for Shingles Vaccine? Yes   Zostavax completed No   Shingrix Completed?: No.    Education has been provided regarding the importance of this vaccine. Patient has been advised to call insurance company to determine out of pocket expense if they have not yet received this vaccine. Advised may also receive vaccine at local pharmacy or Health Dept. Verbalized acceptance and understanding.  Screening Tests Health Maintenance  Topic Date Due   Zoster Vaccines- Shingrix (1 of 2) Never done   COVID-19 Vaccine (3 - Pfizer risk series) 01/20/2020   DTaP/Tdap/Td (2 - Td or Tdap) 11/08/2023   OPHTHALMOLOGY EXAM  11/24/2023   HEMOGLOBIN A1C  04/20/2024   FOOT EXAM  10/30/2024   Medicare Annual Wellness (AWV)  10/30/2024   Pneumonia Vaccine 16+ Years old  Completed   INFLUENZA VACCINE  Completed   HPV VACCINES  Aged Out    Health Maintenance  Health Maintenance Due  Topic Date Due   Zoster Vaccines- Shingrix (1 of 2) Never done   COVID-19 Vaccine (3  - Pfizer risk series) 01/20/2020    Colorectal cancer screening: No longer required.   Lung Cancer Screening: (Low Dose CT Chest recommended if Age 37-80 years, 20 pack-year currently smoking OR have quit w/in 15years.) does not qualify.   Lung Cancer Screening Referral: na  Additional Screening:  Hepatitis C Screening: does not qualify  Vision Screening: Recommended annual ophthalmology exams for early detection of glaucoma and other disorders of the eye. Is the patient up to date with their annual eye exam?  Yes  Who is the provider or what is the name of the office in which the patient attends annual eye exams? [facility] If pt is not established with a provider, would they like to be referred to a provider to establish care? No .   Dental Screening: Recommended annual dental exams for proper oral hygiene  Diabetic Foot Exam: Diabetic Foot Exam: Completed 10/31/2023  Community Resource Referral / Chronic Care Management: CRR required this visit?  No   CCM required this visit?  No     Plan:     I have personally reviewed and noted the following in the patient's chart:   Medical and social history Use of alcohol, tobacco or illicit drugs  Current medications and supplements including opioid prescriptions. Patient is not currently taking opioid prescriptions. Functional ability and status Nutritional status Physical activity Advanced directives List of other physicians Hospitalizations, surgeries, and ER visits in previous 12 months Vitals Screenings to include cognitive, depression, and falls Referrals and appointments  In addition, I have reviewed and discussed with patient certain preventive protocols, quality metrics, and best practice recommendations. A written personalized care plan for preventive services as well as general preventive health recommendations were provided to patient.     [name], NP   10/31/2023   After Visit Summary: (In  Person-Printed) AVS printed and given to the patient

## 2023-11-02 ENCOUNTER — Ambulatory Visit: Payer: Medicare Other | Admitting: Podiatry

## 2023-12-20 LAB — HM DIABETES EYE EXAM

## 2024-01-25 ENCOUNTER — Ambulatory Visit: Payer: Medicare Other | Admitting: Podiatry

## 2024-01-25 ENCOUNTER — Encounter: Payer: Self-pay | Admitting: Podiatry

## 2024-01-25 VITALS — Ht 71.0 in | Wt 273.0 lb

## 2024-01-25 DIAGNOSIS — E0843 Diabetes mellitus due to underlying condition with diabetic autonomic (poly)neuropathy: Secondary | ICD-10-CM | POA: Diagnosis not present

## 2024-01-25 DIAGNOSIS — B351 Tinea unguium: Secondary | ICD-10-CM | POA: Diagnosis not present

## 2024-01-25 DIAGNOSIS — M79674 Pain in right toe(s): Secondary | ICD-10-CM | POA: Diagnosis not present

## 2024-01-25 DIAGNOSIS — M79675 Pain in left toe(s): Secondary | ICD-10-CM | POA: Diagnosis not present

## 2024-01-25 NOTE — Progress Notes (Signed)
 This patient returns to my office for at risk foot care.  This patient requires this care by a professional since this patient will be at risk due to having diabetes with neuropathy.  This patient is unable to cut nails himself since the patient cannot reach his nails.These nails are painful walking and wearing shoes.  This patient presents for at risk foot care today.  General Appearance  Alert, conversant and in no acute stress.  Vascular  Dorsalis pedis and posterior tibial  pulses are  weakly palpable  bilaterally.  Capillary return is within normal limits  bilaterally. Temperature is within normal limits  bilaterally.  Neurologic  Senn-Weinstein monofilament wire test within normal limits  bilaterally. Muscle power within normal limits bilaterally.  Nails Thick disfigured discolored nails with subungual debris  from hallux to fifth toes bilaterally. No evidence of bacterial infection or drainage bilaterally.  Orthopedic  No limitations of motion  feet .  No crepitus or effusions noted.  No bony pathology or digital deformities noted.  Skin  normotropic skin with no porokeratosis noted bilaterally.  No signs of infections or ulcers noted.     Onychomycosis  Pain in right toes  Pain in left toes  Consent was obtained for treatment procedures.   Mechanical debridement of nails 1-5  bilaterally performed with a nail nipper.  Filed with dremel without incident.    Return office visit    3 months                 Told patient to return for periodic foot care and evaluation due to potential at risk complications.   Helane Gunther DPM

## 2024-02-25 ENCOUNTER — Other Ambulatory Visit: Payer: Self-pay | Admitting: Nurse Practitioner

## 2024-02-25 DIAGNOSIS — I10 Essential (primary) hypertension: Secondary | ICD-10-CM

## 2024-02-25 DIAGNOSIS — M109 Gout, unspecified: Secondary | ICD-10-CM

## 2024-03-07 ENCOUNTER — Telehealth: Payer: Self-pay | Admitting: Nurse Practitioner

## 2024-03-07 NOTE — Telephone Encounter (Signed)
 Patient stopped by dropped off a [employer] handicapped placard form to be completed and signed by Camilo Cella. Given to Chrae in [place] please call when done so he can pick up.

## 2024-03-08 NOTE — Telephone Encounter (Signed)
 Form filled in and placed on provider desk (review and sign folder) for completion

## 2024-03-09 NOTE — Telephone Encounter (Signed)
 Form completed and given to front to call for pickup

## 2024-04-19 ENCOUNTER — Other Ambulatory Visit: Payer: Self-pay | Admitting: Cardiology

## 2024-04-23 ENCOUNTER — Ambulatory Visit: Payer: Medicare Other | Admitting: Nurse Practitioner

## 2024-04-23 ENCOUNTER — Encounter: Payer: Self-pay | Admitting: Nurse Practitioner

## 2024-04-23 VITALS — BP 112/74 | HR 53 | Temp 97.7°F | Resp 8 | Ht 71.0 in | Wt 265.2 lb

## 2024-04-23 DIAGNOSIS — N1832 Chronic kidney disease, stage 3b: Secondary | ICD-10-CM

## 2024-04-23 DIAGNOSIS — I10 Essential (primary) hypertension: Secondary | ICD-10-CM | POA: Diagnosis not present

## 2024-04-23 DIAGNOSIS — I700 Atherosclerosis of aorta: Secondary | ICD-10-CM

## 2024-04-23 DIAGNOSIS — C61 Malignant neoplasm of prostate: Secondary | ICD-10-CM

## 2024-04-23 DIAGNOSIS — I420 Dilated cardiomyopathy: Secondary | ICD-10-CM

## 2024-04-23 DIAGNOSIS — D509 Iron deficiency anemia, unspecified: Secondary | ICD-10-CM

## 2024-04-23 DIAGNOSIS — E782 Mixed hyperlipidemia: Secondary | ICD-10-CM

## 2024-04-23 DIAGNOSIS — Z6838 Body mass index (BMI) 38.0-38.9, adult: Secondary | ICD-10-CM

## 2024-04-23 DIAGNOSIS — C7951 Secondary malignant neoplasm of bone: Secondary | ICD-10-CM

## 2024-04-23 DIAGNOSIS — E66812 Obesity, class 2: Secondary | ICD-10-CM | POA: Diagnosis not present

## 2024-04-23 DIAGNOSIS — E559 Vitamin D deficiency, unspecified: Secondary | ICD-10-CM

## 2024-04-23 DIAGNOSIS — E1142 Type 2 diabetes mellitus with diabetic polyneuropathy: Secondary | ICD-10-CM

## 2024-04-23 DIAGNOSIS — C7952 Secondary malignant neoplasm of bone marrow: Secondary | ICD-10-CM

## 2024-04-23 DIAGNOSIS — M1A9XX0 Chronic gout, unspecified, without tophus (tophi): Secondary | ICD-10-CM

## 2024-04-23 NOTE — Assessment & Plan Note (Signed)
Continues on supplement 

## 2024-04-23 NOTE — Progress Notes (Signed)
 Careteam: Patient Care Team: Verma Gobble, NP as PCP - General (Geriatric Medicine) Hugh Madura, MD as PCP - Cardiology (Cardiology) Orvan Blanch, MD as Attending Physician (Urology) Zula Hitch, MD as Consulting Physician (Ophthalmology)  PLACE OF SERVICE:  [facility]  Advanced Directive information    Allergies  Allergen Reactions   Abiraterone Nausea And Vomiting and Other (See Comments)    Other reaction(s): Increased Heart Rate (intolerance)     Chief Complaint  Patient presents with   Medical Management of Chronic Issues    6 month follow up  Pt stated he has cancer showing up and may have blood in stool pt started noticing the blood in stool about 3-4 weeks ago.     HPI:  Discussed the use of AI scribe software for clinical note transcription with the patient, who gave verbal consent to proceed.  History of Present Illness Jeremiah Collins is an 89 year old male with prostate cancer who presents for a follow-up visit.  A recent PET scan revealed new metastatic spots in the groin and near the heart. He is awaiting a second opinion regarding potential chemotherapy treatment options.   He has a history of anemia with previous hemoglobin levels dropping to 8.9. After two iron infusions, his hemoglobin improved, though the current level is unspecified. He resumed iron pills recently. He experiences occasional hematochezia, noticing red blood sometimes, possibly from an external source, as he sometimes sees blood on toilet paper. He has not been diagnosed with hemorrhoids.  He manages his diabetes through diet control, with a recent blood sugar reading of 108 mg/dL. He is not on any medication for diabetes.  He takes allopurinol  for gout, two tablets daily, and will have his uric acid levels rechecked. No recent flares  He experiences intermittent radicular pain that shoots down his legs and sometimes to his arms, describing his legs as feeling like 'a  piece of wood'.   No new bone pain  no changes in urinary frequency or flow.  He has undergone 40 stent changes, with the next one scheduled soon. No issues with chest pain, palpitations, or shortness of breath beyond his usual level. He experiences some ankle swelling but no worsening of arthritis.  He is on Crestor  20 mg for cholesterol management and takes vitamin D supplements. He does not use sunscreen- encouraged use.     Review of Systems:  Review of Systems  Constitutional:  Negative for chills, fever and weight loss.  HENT:  Negative for tinnitus.   Respiratory:  Negative for cough, sputum production and shortness of breath.   Cardiovascular:  Negative for chest pain, palpitations and leg swelling.  Gastrointestinal:  Negative for abdominal pain, constipation, diarrhea and heartburn.  Genitourinary:  Negative for dysuria, frequency and urgency.  Musculoskeletal:  Negative for back pain, falls, joint pain and myalgias.  Skin: Negative.   Neurological:  Positive for tingling. Negative for dizziness and headaches.  Psychiatric/Behavioral:  Negative for depression and memory loss. The patient does not have insomnia.     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/28/2006   Bone metastases    left mid tibial shaft   Cataract    Dr.Groat   Cellulitis and abscess of leg, except foot 01/17/2012   Chest pain, unspecified 04/28/2004   Closed fracture of five ribs 03/24/2004   First degree atrioventricular block 04/25/2007   Gross hematuria 09/06/2011   History of  radiation therapy 04/03/14- 04/17/14   mid to distal left tibia 3000 cGy in 10 sessions   HTN (hypertension)    Hx of radiation therapy 11/1998   prostate fossa - 6040 cGy, 33 fx, Dr Alfonso Ike   Osteoarthrosis involving, or with mention of more than one site, but not specified as generalized, multiple sites 10/22/2010   Other abnormal blood chemistry 04/29/1991   Other  and unspecified hyperlipidemia 01/02/2013   Palpitations 04/28/2004   Prostate cancer (HCC) 12/16/1994   gleason 7   Reflux esophagitis 05/10/2008   Routine general medical examination at a health care facility    Spinal stenosis, unspecified region other than cervical 04/29/1995   Type II or unspecified type diabetes mellitus without mention of complication, uncontrolled    Past Surgical History:  Procedure Laterality Date   AIR/FLUID EXCHANGE Left 01/09/2016   Procedure: AIR/FLUID EXCHANGE;  Surgeon: Jearline Minder, MD;  Location: [facility] OR;  Service: Ophthalmology;  Laterality: Left;   CYSTOSCOPY W/ URETERAL STENT PLACEMENT  12/31/14   CYSTOSCOPY W/ URETERAL STENT PLACEMENT  05/16/2015   EYE SURGERY  2006   cataract, Dr Langston Pippins   LASER PHOTO ABLATION Left 01/09/2016   Procedure: LASER PHOTO ABLATION;  Surgeon: Jearline Minder, MD;  Location: [facility] OR;  Service: Ophthalmology;  Laterality: Left;  Endolaser   Left ureteral stent placement  Week of 12/05/11   PARS PLANA VITRECTOMY Left 01/09/2016   Procedure: PARS PLANA VITRECTOMY WITH 25 GAUGE LEFT EYE ;  Surgeon: Jearline Minder, MD;  Location: [facility] OR;  Service: Ophthalmology;  Laterality: Left;   PERFLUORONE INJECTION Left 01/09/2016   Procedure: PERFLUORONE INJECTION;  Surgeon: Jearline Minder, MD;  Location: [facility] OR;  Service: Ophthalmology;  Laterality: Left;   PROSTATECTOMY  1996   Dr. Nolon Baxter   URETERAL STENT PLACEMENT  05/2020   [facility] every 3 months    URETERAL STENT PLACEMENT  09/23/2020   Patient gets replaced every 3 months    Social History:   reports that he quit smoking about 55 years ago. His smoking use included cigars. He has never used smokeless tobacco. He reports that he does not drink alcohol and does not use drugs.  Family History  Problem Relation Age of Onset   Heart disease Father    Stroke Sister     Medications: Patient's Medications  New Prescriptions   No medications on file  Previous Medications   ACETAMINOPHEN   (TYLENOL ) 500 MG TABLET    Take 1,000 mg by mouth every 8 (eight) hours as needed for mild pain (pain score 1-3) or moderate pain (pain score 4-6).   ALLOPURINOL  (ZYLOPRIM ) 100 MG TABLET    TAKE 2 TABLETS BY MOUTH EVERY DAY   ASPIRIN  81 MG TABLET    Take 1 tablet (81 mg total) by mouth daily.   CALCIUM  CARB-CHOLECALCIFEROL  600-5 MG-MCG TABS    Take 2 tablets by mouth daily.   CALCIUM  CARBONATE (TUMS - DOSED IN MG ELEMENTAL CALCIUM ) 500 MG CHEWABLE TABLET    Chew 1 tablet by mouth as needed for indigestion or heartburn.   CHOLECALCIFEROL  1000 UNITS TABLET    Take 1,000 Units by mouth daily.   DOXAZOSIN  (CARDURA ) 4 MG TABLET    TAKE 1 TABLET BY MOUTH EVERY DAY   FERROUS SULFATE  325 (65 FE) MG EC TABLET    Take 1 tablet (325 mg total) by mouth daily with breakfast.   GLUCOSE BLOOD (ONETOUCH VERIO) TEST STRIP    Check blood sugar one to two times a  week. Dx:E11.29   LEUPROLIDE , 6 MONTH, (ELIGARD ) 45 MG INJECTION    Inject 45 mg into the skin every 6 (six) months. Filled by [facility]   LISINOPRIL -HYDROCHLOROTHIAZIDE  (ZESTORETIC ) 20-12.5 MG TABLET    TAKE 2 TABLETS BY MOUTH EVERY MORNING   METOPROLOL  TARTRATE (LOPRESSOR ) 50 MG TABLET    TAKE 1 TABLET BY MOUTH TWICE A DAY   OMEGA-3 FATTY ACIDS (FISH OIL ) 1000 MG CAPS    Take 1 capsule by mouth daily.   PYRIDOXINE (B-6) 100 MG TABLET    Take 100 mg by mouth daily.   ROSUVASTATIN  (CRESTOR ) 20 MG TABLET    Take 1 tablet (20 mg total) by mouth daily.  Modified Medications   No medications on file  Discontinued Medications   No medications on file    Physical Exam:  Vitals:   04/23/24 0835  BP: 112/74  Pulse: (!) 53  Resp: (!) 8  Temp: 97.7 F (36.5 C)  TempSrc: Temporal  SpO2: 97%  Weight: 265 lb 3.2 oz (120.3 kg)  Height: 5\' 11"  (1.803 m)   Body mass index is 36.99 kg/m. Wt Readings from Last 3 Encounters:  04/23/24 265 lb 3.2 oz (120.3 kg)  01/25/24 273 lb (123.8 kg)  10/31/23 273 lb 6.4 oz (124 kg)    Physical  Exam Constitutional:      General: He is not in acute distress.    Appearance: He is well-developed. He is not diaphoretic.  HENT:     Head: Normocephalic and atraumatic.     Right Ear: External ear normal.     Left Ear: External ear normal.     Mouth/Throat:     Pharynx: No oropharyngeal exudate.  Eyes:     Conjunctiva/sclera: Conjunctivae normal.     Pupils: Pupils are equal, round, and reactive to light.  Cardiovascular:     Rate and Rhythm: Normal rate and regular rhythm.     Heart sounds: Normal heart sounds.  Pulmonary:     Effort: Pulmonary effort is normal.     Breath sounds: Normal breath sounds.  Abdominal:     General: Bowel sounds are normal.     Palpations: Abdomen is soft.  Musculoskeletal:        General: No tenderness.     Cervical back: Normal range of motion and neck supple.     Right lower leg: No edema.     Left lower leg: No edema.  Skin:    General: Skin is warm and dry.  Neurological:     Mental Status: He is alert and oriented to person, place, and time.     Labs reviewed: Basic Metabolic Panel: Recent Labs    10/21/23 0924  NA 142  K 4.7  CL 109  CO2 26  GLUCOSE 129*  BUN 37*  CREATININE 1.67*  CALCIUM  9.1   Liver Function Tests: Recent Labs    10/21/23 0924  AST 15  ALT 11  BILITOT 0.3  PROT 5.8*   No results for input(s): "LIPASE", "AMYLASE" in the last 8760 hours. No results for input(s): "AMMONIA" in the last 8760 hours. CBC: Recent Labs    10/21/23 0924 04/23/24 0914  WBC 8.2 7.1  NEUTROABS 5,396 4,892  HGB 9.7* 9.0*  HCT 30.4* 28.5*  MCV 87.9 91.9  PLT 167 146   Lipid Panel: Recent Labs    10/21/23 0924  CHOL 98  HDL 44  LDLCALC 40  TRIG 68  CHOLHDL 2.2   TSH: No results for  input(s): "TSH" in the last 8760 hours. A1C: Lab Results  Component Value Date   HGBA1C 5.6 04/23/2024     Assessment/Plan Iron deficiency anemia, unspecified iron deficiency anemia type Assessment & Plan: Followed by  hematology, continues on iron supplement, he questions if he has blood in stool- will get ifob at this time  Orders: -     CBC with Differential/Platelet -     Fecal Globin By Immunochemistry; Future  Secondary malignant neoplasm of bone and bone marrow (HCC) Assessment & Plan: Ongoing, recent pet scan with new lesions. Has follow up with oncology for further discuss of treatment options   Congestive dilated cardiomyopathy (HCC) Assessment & Plan: Stable without worsening shortness of breath, LE edema or chest pains. Followed by cardiology. Continues on metoprolol , lisinopril -hctz   Class 2 severe obesity due to excess calories with serious comorbidity and body mass index (BMI) of 38.0 to 38.9 in adult [facility]) Assessment & Plan: -education provided on healthy weight loss through increase in physical activity and proper nutrition    Stage 3b chronic kidney disease (HCC) Assessment & Plan: Chronic and stable Encourage proper hydration Follow metabolic panel Avoid nephrotoxic meds (NSAIDS)    Essential hypertension, benign Assessment & Plan: Blood pressure well controlled, goal bp <140/90 Continue current medications and dietary modifications follow metabolic panel    Chronic gout without tophus, unspecified cause, unspecified site Assessment & Plan: No recent flare, continues on allopurinol .   Orders: -     Uric acid  DM type 2 with diabetic peripheral neuropathy (HCC) Assessment & Plan: Diet controlled, continue dietary compliance, routine foot care/monitoring and to keep up with diabetic eye exams through ophthalmology   Orders: -     Hemoglobin A1c  Mixed hyperlipidemia Assessment & Plan: Continues on crestor    Malignant neoplasm of prostate [facility]) Assessment & Plan: Ongoing, continues routine follow up with urology and oncology   Diabetic polyneuropathy associated with type 2 diabetes mellitus (HCC) Assessment & Plan: Ongoing an stable.    Vitamin D  deficiency Assessment & Plan: Continues on supplement    Atherosclerosis of aorta [facility]) Assessment & Plan: Noted on imaging Continues on ASA and statin   Other orders -     CBC with Differential/Platelet     Return in about 6 months (around 10/23/2024) for routine follow up.:  Dixie Jafri K. Denney Fisherman [facility] [phone]

## 2024-04-23 NOTE — Assessment & Plan Note (Signed)
 Ongoing, continues routine follow up with urology and oncology

## 2024-04-23 NOTE — Assessment & Plan Note (Signed)
 Ongoing, recent pet scan with new lesions. Has follow up with oncology for further discuss of treatment options

## 2024-04-24 ENCOUNTER — Ambulatory Visit: Payer: Self-pay | Admitting: Nurse Practitioner

## 2024-04-24 DIAGNOSIS — D509 Iron deficiency anemia, unspecified: Secondary | ICD-10-CM | POA: Insufficient documentation

## 2024-04-24 DIAGNOSIS — N1832 Chronic kidney disease, stage 3b: Secondary | ICD-10-CM | POA: Insufficient documentation

## 2024-04-24 LAB — CBC WITH DIFFERENTIAL/PLATELET
Absolute Lymphocytes: 1598 {cells}/uL (ref 850–3900)
Absolute Monocytes: 383 {cells}/uL (ref 200–950)
Basophils Absolute: 50 {cells}/uL (ref 0–200)
Basophils Relative: 0.7 %
Eosinophils Absolute: 178 {cells}/uL (ref 15–500)
Eosinophils Relative: 2.5 %
HCT: 28.5 % — ABNORMAL LOW (ref 38.5–50.0)
Hemoglobin: 9 g/dL — ABNORMAL LOW (ref 13.2–17.1)
MCH: 29 pg (ref 27.0–33.0)
MCHC: 31.6 g/dL — ABNORMAL LOW (ref 32.0–36.0)
MCV: 91.9 fL (ref 80.0–100.0)
MPV: 9.8 fL (ref 7.5–12.5)
Monocytes Relative: 5.4 %
Neutro Abs: 4892 {cells}/uL (ref 1500–7800)
Neutrophils Relative %: 68.9 %
Platelets: 146 10*3/uL (ref 140–400)
RBC: 3.1 10*6/uL — ABNORMAL LOW (ref 4.20–5.80)
RDW: 17 % — ABNORMAL HIGH (ref 11.0–15.0)
Total Lymphocyte: 22.5 %
WBC: 7.1 10*3/uL (ref 3.8–10.8)

## 2024-04-24 LAB — URIC ACID: Uric Acid, Serum: 5.7 mg/dL (ref 4.0–8.0)

## 2024-04-24 LAB — HEMOGLOBIN A1C
Hgb A1c MFr Bld: 5.6 % (ref <5.7)
Mean Plasma Glucose: 114 mg/dL
eAG (mmol/L): 6.3 mmol/L

## 2024-04-24 NOTE — Assessment & Plan Note (Signed)
 Followed by hematology, continues on iron supplement, he questions if he has blood in stool- will get ifob at this time

## 2024-04-24 NOTE — Assessment & Plan Note (Signed)
 Ongoing an stable.

## 2024-04-24 NOTE — Assessment & Plan Note (Signed)
 Blood pressure well controlled, goal bp <140/90 Continue current medications and dietary modifications follow metabolic panel

## 2024-04-24 NOTE — Assessment & Plan Note (Signed)
 No recent flare, continues on allopurinol .

## 2024-04-24 NOTE — Assessment & Plan Note (Signed)
 Continues on crestor

## 2024-04-24 NOTE — Assessment & Plan Note (Signed)
-  education provided on healthy weight loss through increase in physical activity and proper nutrition

## 2024-04-24 NOTE — Assessment & Plan Note (Signed)
 Diet controlled, continue dietary compliance, routine foot care/monitoring and to keep up with diabetic eye exams through ophthalmology

## 2024-04-24 NOTE — Assessment & Plan Note (Signed)
 Stable without worsening shortness of breath, LE edema or chest pains. Followed by cardiology. Continues on metoprolol , lisinopril -hctz

## 2024-04-24 NOTE — Assessment & Plan Note (Signed)
 Noted on imaging Continues on ASA and statin

## 2024-04-24 NOTE — Assessment & Plan Note (Signed)
 Chronic and stable Encourage proper hydration Follow metabolic panel Avoid nephrotoxic meds (NSAIDS)

## 2024-04-25 ENCOUNTER — Other Ambulatory Visit: Payer: Self-pay

## 2024-04-25 DIAGNOSIS — D509 Iron deficiency anemia, unspecified: Secondary | ICD-10-CM

## 2024-04-26 ENCOUNTER — Other Ambulatory Visit: Payer: Self-pay | Admitting: Nurse Practitioner

## 2024-04-26 ENCOUNTER — Encounter: Payer: Self-pay | Admitting: Podiatry

## 2024-04-26 ENCOUNTER — Ambulatory Visit: Admitting: Podiatry

## 2024-04-26 DIAGNOSIS — M79674 Pain in right toe(s): Secondary | ICD-10-CM | POA: Diagnosis not present

## 2024-04-26 DIAGNOSIS — I517 Cardiomegaly: Secondary | ICD-10-CM

## 2024-04-26 DIAGNOSIS — M79675 Pain in left toe(s): Secondary | ICD-10-CM

## 2024-04-26 DIAGNOSIS — E0843 Diabetes mellitus due to underlying condition with diabetic autonomic (poly)neuropathy: Secondary | ICD-10-CM

## 2024-04-26 DIAGNOSIS — I10 Essential (primary) hypertension: Secondary | ICD-10-CM

## 2024-04-26 DIAGNOSIS — B351 Tinea unguium: Secondary | ICD-10-CM

## 2024-04-26 NOTE — Progress Notes (Signed)
 This patient returns to my office for at risk foot care.  This patient requires this care by a professional since this patient will be at risk due to having diabetes with neuropathy.  This patient is unable to cut nails himself since the patient cannot reach his nails.These nails are painful walking and wearing shoes.  This patient presents for at risk foot care today.  General Appearance  Alert, conversant and in no acute stress.  Vascular  Dorsalis pedis and posterior tibial  pulses are  weakly palpable  bilaterally.  Capillary return is within normal limits  bilaterally. Temperature is within normal limits  bilaterally.  Neurologic  Senn-Weinstein monofilament wire test within normal limits  bilaterally. Muscle power within normal limits bilaterally.  Nails Thick disfigured discolored nails with subungual debris  from hallux to fifth toes bilaterally. No evidence of bacterial infection or drainage bilaterally.  Orthopedic  No limitations of motion  feet .  No crepitus or effusions noted.  No bony pathology or digital deformities noted.  Skin  normotropic skin with no porokeratosis noted bilaterally.  No signs of infections or ulcers noted.     Onychomycosis  Pain in right toes  Pain in left toes  Consent was obtained for treatment procedures.   Mechanical debridement of nails 1-5  bilaterally performed with a nail nipper.  Filed with dremel without incident.    Return office visit    3 months                 Told patient to return for periodic foot care and evaluation due to potential at risk complications.   Helane Gunther DPM

## 2024-04-30 ENCOUNTER — Other Ambulatory Visit: Payer: Self-pay | Admitting: Cardiology

## 2024-07-26 ENCOUNTER — Ambulatory Visit (INDEPENDENT_AMBULATORY_CARE_PROVIDER_SITE_OTHER): Admitting: Podiatry

## 2024-07-26 ENCOUNTER — Encounter: Payer: Self-pay | Admitting: Podiatry

## 2024-07-26 VITALS — Ht 71.0 in | Wt 265.2 lb

## 2024-07-26 DIAGNOSIS — M79674 Pain in right toe(s): Secondary | ICD-10-CM

## 2024-07-26 DIAGNOSIS — B351 Tinea unguium: Secondary | ICD-10-CM | POA: Diagnosis not present

## 2024-07-26 DIAGNOSIS — M79675 Pain in left toe(s): Secondary | ICD-10-CM | POA: Diagnosis not present

## 2024-07-26 DIAGNOSIS — E0843 Diabetes mellitus due to underlying condition with diabetic autonomic (poly)neuropathy: Secondary | ICD-10-CM | POA: Diagnosis not present

## 2024-07-26 NOTE — Progress Notes (Signed)
 This patient returns to my office for at risk foot care.  This patient requires this care by a professional since this patient will be at risk due to having diabetes with neuropathy.  This patient is unable to cut nails himself since the patient cannot reach his nails.These nails are painful walking and wearing shoes.  This patient presents for at risk foot care today.  General Appearance  Alert, conversant and in no acute stress.  Vascular  Dorsalis pedis and posterior tibial  pulses are  weakly palpable  bilaterally.  Capillary return is within normal limits  bilaterally. Temperature is within normal limits  bilaterally.  Neurologic  Senn-Weinstein monofilament wire test within normal limits  bilaterally. Muscle power within normal limits bilaterally.  Nails Thick disfigured discolored nails with subungual debris  from hallux to fifth toes bilaterally. No evidence of bacterial infection or drainage bilaterally.  Orthopedic  No limitations of motion  feet .  No crepitus or effusions noted.  No bony pathology or digital deformities noted.  Skin  normotropic skin with no porokeratosis noted bilaterally.  No signs of infections or ulcers noted.     Onychomycosis  Pain in right toes  Pain in left toes  Consent was obtained for treatment procedures.   Mechanical debridement of nails 1-5  bilaterally performed with a nail nipper.  Filed with dremel without incident.    Return office visit    3 months                 Told patient to return for periodic foot care and evaluation due to potential at risk complications.   Helane Gunther DPM

## 2024-08-23 ENCOUNTER — Ambulatory Visit (HOSPITAL_COMMUNITY)
Admission: RE | Admit: 2024-08-23 | Discharge: 2024-08-23 | Disposition: A | Discharge status: Home / Self Care | Source: Ambulatory Visit | Attending: Cardiology | Admitting: Cardiology

## 2024-08-23 DIAGNOSIS — I350 Nonrheumatic aortic (valve) stenosis: Secondary | ICD-10-CM | POA: Diagnosis present

## 2024-08-23 LAB — ECHOCARDIOGRAM COMPLETE
AR max vel: 0.97 cm2
AV Area VTI: 1.01 cm2
AV Area mean vel: 1 cm2
AV Mean grad: 15 mmHg
AV Peak grad: 27.7 mmHg
Ao pk vel: 2.63 m/s
Area-P 1/2: 4.06 cm2
S' Lateral: 3.16 cm

## 2024-08-24 ENCOUNTER — Ambulatory Visit: Payer: Self-pay | Admitting: Cardiology

## 2024-08-24 DIAGNOSIS — I359 Nonrheumatic aortic valve disorder, unspecified: Secondary | ICD-10-CM

## 2024-09-03 NOTE — Progress Notes (Unsigned)
 Patient ID: Jeremiah Collins MRN: [id number] DOB/AGE: 07/29/1934 89 y.o.  Primary Care Physician:Eubanks, Harlene POUR, NP Primary Cardiologist: Jeffrie  CC:  Aortic valvular disease management     FOCUSED PROBLEM LIST:   Aortic stenosis AVA 1, DI 0.25, SVI 29, MG 15, V-max 2.6, EF 55 to 60% TTE October 2025 EKG sinus bradycardia without bundle-branch blocks T2DM Not on insulin  Hypertension Hyperlipidemia Aortic atherosclerosis CT abdomen pelvis 2015 CKD stage IIIb Metastatic prostate cancer Dx'd 1996 Chronic left hydronephrosis requiring regular stent exchanges Iron deficiency anemia BMI 37/BSA 2.18 August 2024:  Patient consents to use of AI scribe. The patient is 89 year old male with the above listed medical problems referred for recommendation regarding his paradoxical low-flow low gradient aortic stenosis.  Patient was seen 2 years ago and was having some shortness of breath but was relatively mild.  An echocardiogram done recently demonstrated the indices as detailed above.  He is scheduled for a cystoscopy on Monday to replace a kidney stent, a procedure he undergoes regularly due to complications from prostate cancer. He has had approximately forty of these procedures, typically four times a year, but a new stent may reduce the frequency to three times a year. His last procedure was about four months ago.  These are done with general anesthesia.  He experiences fatigue with exertion, which has gradually worsened over several years, though he is unsure if this is shortness of breath. 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 No shortness of breath or fatigue at rest, but these symptoms occur with physical activity. He also reports a sensation of pressure in his head during exertion, though he has not experienced dizziness or syncope.  He has noticed worsening swelling in his legs. He reports a history of low blood  pressure episodes, with a notable instance of 60/40 mmHg, though these episodes resolve spontaneously. He is currently on medication for blood pressure management.  He was diagnosed with prostate cancer in 1996, managed with radiation and medication, including Eligard  and Nubeqa. His PSA levels fluctuate, and he is currently on a new medication that has reduced his PSA levels. He has not undergone chemotherapy.  No significant issues with his teeth, stating he has all his teeth with a few caps. He has not experienced any major bleeding issues, though he suspects some problems with his hemoglobin levels, which are being monitored by his oncologist.         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/28/2006   Bone metastases    left mid tibial shaft   Cataract    Dr.Groat   Cellulitis and abscess of leg, except foot 01/17/2012   Chest pain, unspecified 04/28/2004   Closed fracture of five ribs 03/24/2004   First degree atrioventricular block 04/25/2007   Gross hematuria 09/06/2011   History of radiation therapy 04/03/14- 04/17/14   mid to distal left tibia 3000 cGy in 10 sessions   HTN (hypertension)    Hx of radiation therapy 11/1998   prostate fossa - 6040 cGy, 33 fx, Dr Johney   Osteoarthrosis involving, or with mention of more than one site, but not specified as generalized, multiple sites 10/22/2010   Other abnormal blood chemistry 04/29/1991   Other and unspecified hyperlipidemia 01/02/2013   Palpitations 04/28/2004   Prostate cancer (HCC) 12/16/1994   gleason 7   Reflux esophagitis 05/10/2008   Routine general medical examination  at a health care facility    Spinal stenosis, unspecified region other than cervical 04/29/1995   Type II or unspecified type diabetes mellitus without mention of complication, uncontrolled     Past Surgical History:  Procedure Laterality Date   AIR/FLUID EXCHANGE Left 01/09/2016   Procedure:  AIR/FLUID EXCHANGE;  Surgeon: Onesimo Blanch, MD;  Location: [facility] OR;  Service: Ophthalmology;  Laterality: Left;   CYSTOSCOPY W/ URETERAL STENT PLACEMENT  12/31/14   CYSTOSCOPY W/ URETERAL STENT PLACEMENT  05/16/2015   EYE SURGERY  2006   cataract, Dr Loras   LASER PHOTO ABLATION Left 01/09/2016   Procedure: LASER PHOTO ABLATION;  Surgeon: Onesimo Blanch, MD;  Location[facility] OR;  Service: Ophthalmology;  Laterality: Left;  Endolaser   Left ureteral stent placement  Week of 12/05/11   PARS PLANA VITRECTOMY Left 01/09/2016   Procedure: PARS PLANA VITRECTOMY WITH 25 GAUGE LEFT EYE ;  Surgeon: Onesimo Blanch, MD;  Location: [facility] OR;  Service: Ophthalmology;  Laterality: Left;   PERFLUORONE INJECTION Left 01/09/2016   Procedure: PERFLUORONE INJECTION;  Surgeon: Onesimo Blanch, MD;  Location: [facility] OR;  Service: Ophthalmology;  Laterality: Left;   PROSTATECTOMY  1996   [name]   URETERAL STENT PLACEMENT  05/2020   [facility] every 3 months    URETERAL STENT PLACEMENT  09/23/2020   Patient gets replaced every 3 months     Family History  Problem Relation Age of Onset   Heart disease Father    Stroke Sister     Social History   Socioeconomic History   Marital status: Married    Spouse name: Not on file   Number of children: Not on file   Years of education: Not on file   Highest education level: Not on file  Occupational History   Not on file  Tobacco Use   Smoking status: Former    Types: Cigars    Quit date: 12/16/1968    Years since quitting: 55.7   Smokeless tobacco: Never  Vaping Use   Vaping status: Never Used  Substance and Sexual Activity   Alcohol use: No    Alcohol/week: 0.0 standard drinks of alcohol   Drug use: No   Sexual activity: Never  Other Topics Concern   Not on file  Social History Narrative   Not on file   Social Drivers of Health   Financial Resource Strain: Low Risk  (09/15/2018)   Overall Financial Resource Strain (CARDIA)    Difficulty of Paying Living  Expenses: Not hard at all  Food Insecurity: No Food Insecurity (09/15/2018)   Hunger Vital Sign    Worried About Running Out of Food in the Last Year: Never true    Ran Out of Food in the Last Year: Never true  Transportation Needs: No Transportation Needs (09/15/2018)   PRAPARE - Administrator, Civil Service (Medical): No    Lack of Transportation (Non-Medical): No  Physical Activity: Inactive (09/15/2018)   Exercise Vital Sign    Days of Exercise per Week: 0 days    Minutes of Exercise per Session: 0 min  Stress: No Stress Concern Present (09/15/2018)   [employer] of Occupational Health - Occupational Stress Questionnaire    Feeling of Stress : Not at all  Social Connections: Somewhat Isolated (09/15/2018)   Social Connection and Isolation Panel    Frequency of Communication with Friends and Family: Three times a week    Frequency of Social Gatherings with Friends and Family: Once  a week    Attends Religious Services: Never    Active Member of Clubs or Organizations: No    Attends Banker Meetings: Never    Marital Status: Married  Catering manager Violence: Not At Risk (09/15/2018)   Humiliation, Afraid, Rape, and Kick questionnaire    Fear of Current or Ex-Partner: No    Emotionally Abused: No    Physically Abused: No    Sexually Abused: No     Prior to Admission medications   Medication Sig Start Date End Date Taking? Authorizing Provider  acetaminophen  (TYLENOL ) 500 MG tablet Take 1,000 mg by mouth every 8 (eight) hours as needed for mild pain (pain score 1-3) or moderate pain (pain score 4-6).    [provider]  allopurinol  (ZYLOPRIM ) 100 MG tablet TAKE 2 TABLETS BY MOUTH EVERY DAY 02/27/24   Eubanks, Jessica K, NP  aspirin  81 MG tablet Take 1 tablet (81 mg total) by mouth daily. 01/14/15   Arnetha Heft, MD  Calcium  Carb-Cholecalciferol  600-5 MG-MCG TABS Take 2 tablets by mouth daily. 09/07/21   [provider]  calcium   carbonate (TUMS - DOSED IN MG ELEMENTAL CALCIUM ) 500 MG chewable tablet Chew 1 tablet by mouth as needed for indigestion or heartburn. Patient not taking: Reported on 07/26/2024    [provider]  Cholecalciferol  1000 UNITS tablet Take 1,000 Units by mouth daily.    [provider]  doxazosin  (CARDURA ) 4 MG tablet TAKE 1 TABLET BY MOUTH EVERY DAY 04/26/24   Eubanks, Jessica K, NP  ferrous sulfate  325 (65 FE) MG EC tablet Take 1 tablet (325 mg total) by mouth daily with breakfast. 10/21/23   Eubanks, Jessica K, NP  glucose blood (ONETOUCH VERIO) test strip Check blood sugar one to two times a week. Dx:E11.29 10/21/23   [name], NP  leuprolide , 6 Month, (ELIGARD ) 45 MG injection Inject 45 mg into the skin every 6 (six) months. Filled by [facility] 08/20/15   Franchot Hough, DO  lisinopril -hydrochlorothiazide  (ZESTORETIC ) 20-12.5 MG tablet TAKE 2 TABLETS BY MOUTH EVERY MORNING 02/27/24   Eubanks, Jessica K, NP  metoprolol  tartrate (LOPRESSOR ) 50 MG tablet TAKE 1 TABLET BY MOUTH TWICE A DAY 04/26/24   Eubanks, Jessica K, NP  Omega-3 Fatty Acids (FISH OIL ) 1000 MG CAPS Take 1 capsule by mouth daily.    [provider]  pyridoxine (B-6) 100 MG tablet Take 100 mg by mouth daily.    [provider]  rosuvastatin  (CRESTOR ) 20 MG tablet Take 1 tablet (20 mg total) by mouth daily. 04/19/24   Jeffrie Oneil BROCKS, MD    Allergies  Allergen Reactions   Abiraterone Nausea And Vomiting and Other (See Comments)    Other reaction(s): Increased Heart Rate (intolerance)     REVIEW OF SYSTEMS:  General: no fevers/chills/night sweats Eyes: no blurry vision, diplopia, or amaurosis ENT: no sore throat or hearing loss Resp: no cough, wheezing, or hemoptysis CV: no edema or palpitations GI: no abdominal pain, nausea, vomiting, diarrhea, or constipation GU: no dysuria, frequency, or hematuria Skin: no rash Neuro: no headache, numbness, tingling, or weakness of  extremities Musculoskeletal: no joint pain or swelling Heme: no bleeding, DVT, or easy bruising Endo: no polydipsia or polyuria  BP (!) 98/40   Pulse (!) 58   Ht 5' 11 (1.803 m)   Wt 265 lb 6.4 oz (120.4 kg)   SpO2 99%   BMI 37.02 kg/m   PHYSICAL EXAM: GEN:  AO x 3 in no  acute distress HEENT: normal Dentition: Normal Neck: JVP normal. +2 carotid upstrokes without bruits. No thyromegaly. Lungs: equal expansion, clear bilaterally CV: Apex is discrete and nondisplaced, RRR with 2/6 SEM Abd: soft, non-tender, non-distended; no bruit; positive bowel sounds Ext: no edema, ecchymoses, or cyanosis Vascular: 2+ femoral pulses, 2+ radial pulses       Skin: warm and dry without rash Neuro: CN II-XII grossly intact; motor and sensory grossly intact    DATA AND STUDIES:  EKG:   EKG Interpretation Date/Time:  Wednesday September 05 2024 08:59:23 EDT Ventricular Rate:  58 PR Interval:    QRS Duration:  104 QT Interval:  424 QTC Calculation: 416 R Axis:   -33  Text Interpretation: Junctional rhythm Left axis deviation Minimal voltage criteria for LVH, may be normal variant ( R in aVL ) When compared with ECG of 08-Nov-2018 10:16, PREVIOUS ECG IS PRESENT Confirmed by Wendel Haws ([id number]) on 09/05/2024 9:15:47 AM        CARDIAC STUDIES: Refer to CV Procedures and Imaging Tabs  10/21/2023: ALT 11; BUN 37; Creat 1.67; Potassium 4.7; Sodium 142 04/23/2024: Hemoglobin 9.0; Platelets 146   STS RISK CALCULATOR: Pending  NYHA CLASS: 2    ASSESSMENT AND PLAN:   1. Nonrheumatic aortic valve stenosis   2. Type 2 diabetes mellitus with complication, without long-term current use of insulin  (HCC)   3. Hypertension associated with diabetes (HCC)   4. Hyperlipidemia associated with type 2 diabetes mellitus (HCC)   5. Aortic atherosclerosis   6. CKD stage 3 due to type 2 diabetes mellitus (HCC)   7. BMI 37.0-37.9, adult   8. Preoperative cardiovascular examination   9. Pre-procedure  lab exam     Aortic stenosis: The patient has developed NYHA II symptoms of shortness of breath.  I reviewed his echocardiogram and his aortic valve indices are consistent with paradoxical low-flow low gradient aortic stenosis.  I think it makes sense to pursue an evaluation for aortic valve intervention.  I will refer him for a right heart catheterization, coronary angiography study, TAVR protocol CTA, and cardiothoracic surgical appointment.  It would be best to manage his aortic stenosis before it becomes severe with a mean gradient of over 40 mmHg especially given the fact that he needs regular stent exchanges and these procedures are done with general anesthesia. T2DM: Continue aspirin  81 mg, lisinopril /hydrochlorothiazide  20/12.5 mg twice daily, rosuvastatin  20 mg this morning. Hypertension: Has had low blood pressures; will stop Lopressor  50 mg twice daily  Hyperlipidemia: Continue rosuvastatin  20 mg daily Aortic atherosclerosis: Continue aspirin  81 mg, rosuvastatin  20 mg CKD stage III: Continues on lisinopril /hydrochlorothiazide  12 x 12.5 mg twice a day Elevated BMI/BSA: If TAVR is pursued this may be important in terms of valve choice. Preoperative cardiovascular examination:  He requires regular stent exchanges for his hydronephrosis.  His last stent exchange was a few months ago also under general anesthesia.  He does not have critical aortic stenosis.  This procedure will be done under general anesthesia.  He has his next procedure scheduled on Monday.  I think he is at relatively low risk to undergo this procedure under general anesthesia.  I will reach out to [name] [phone]), the patient's urologist about perhaps considering conscious sedation if possible.  Again since the patient does not have severe aortic stenosis with a mean gradient that is elevated I think general anesthesia for his cystoscopy is permissible.   I have personally reviewed the patients imaging data as  summarized above.  I have reviewed the natural history of aortic stenosis with the patient and family members who are present today. We have discussed the limitations of medical therapy and the poor prognosis associated with symptomatic aortic stenosis. We have also reviewed potential treatment options, including palliative medical therapy, conventional surgical aortic valve replacement, and transcatheter aortic valve replacement. We discussed treatment options in the context of this patient's specific comorbid medical conditions.   All of the patient's questions were answered today. Will make further recommendations based on the results of studies outlined above.   I spent 52 minutes reviewing all clinical data during and prior to this visit including all relevant imaging studies, laboratories, clinical information from other health systems and prior notes from both Cardiology and other specialties, interviewing the patient, conducting a complete physical examination, and coordinating care in order to formulate a comprehensive and personalized evaluation and treatment plan.   Ezana Hubbert K Roxi Hlavaty, MD  09/05/2024 10:09 AM    [facility] [address] [place], [place], [place]  [address] Phone: [phone]; Fax: [phone]

## 2024-09-05 ENCOUNTER — Ambulatory Visit: Attending: Internal Medicine | Admitting: Internal Medicine

## 2024-09-05 VITALS — BP 98/40 | HR 58 | Ht 71.0 in | Wt 265.4 lb

## 2024-09-05 DIAGNOSIS — N183 Chronic kidney disease, stage 3 unspecified: Secondary | ICD-10-CM

## 2024-09-05 DIAGNOSIS — Z01810 Encounter for preprocedural cardiovascular examination: Secondary | ICD-10-CM

## 2024-09-05 DIAGNOSIS — E1159 Type 2 diabetes mellitus with other circulatory complications: Secondary | ICD-10-CM

## 2024-09-05 DIAGNOSIS — I700 Atherosclerosis of aorta: Secondary | ICD-10-CM

## 2024-09-05 DIAGNOSIS — E1169 Type 2 diabetes mellitus with other specified complication: Secondary | ICD-10-CM | POA: Diagnosis not present

## 2024-09-05 DIAGNOSIS — Z6837 Body mass index (BMI) 37.0-37.9, adult: Secondary | ICD-10-CM

## 2024-09-05 DIAGNOSIS — I350 Nonrheumatic aortic (valve) stenosis: Secondary | ICD-10-CM

## 2024-09-05 DIAGNOSIS — I152 Hypertension secondary to endocrine disorders: Secondary | ICD-10-CM

## 2024-09-05 DIAGNOSIS — E118 Type 2 diabetes mellitus with unspecified complications: Secondary | ICD-10-CM | POA: Diagnosis not present

## 2024-09-05 DIAGNOSIS — E1122 Type 2 diabetes mellitus with diabetic chronic kidney disease: Secondary | ICD-10-CM

## 2024-09-05 DIAGNOSIS — E785 Hyperlipidemia, unspecified: Secondary | ICD-10-CM

## 2024-09-05 DIAGNOSIS — Z01812 Encounter for preprocedural laboratory examination: Secondary | ICD-10-CM

## 2024-09-05 NOTE — Progress Notes (Unsigned)
 Pre Surgical Assessment: 5 M Walk Test  63M=16.11ft  5 Meter Walk Test- trial 1: 7.55 seconds 5 Meter Walk Test- trial 2: 7.48 seconds 5 Meter Walk Test- trial 3: 7.56 seconds 5 Meter Walk Test Average: 7.53 seconds

## 2024-09-05 NOTE — Patient Instructions (Signed)
 Medication Instructions:  STOP Metoprolol  Tartrate (Lopressor )   *If you need a refill on your cardiac medications before your next appointment, please call your pharmacy*  Lab Work: To be completed today: BMP and CBC  If you have labs (blood work) drawn today and your tests are completely normal, you will receive your results only by: MyChart Message (if you have MyChart) OR A paper copy in the mail If you have any lab test that is abnormal or we need to change your treatment, we will call you to review the results.  Testing/Procedures: Your physician has requested that you have a cardiac catheterization. Cardiac catheterization is used to diagnose and/or treat various heart conditions. Doctors may recommend this procedure for a number of different reasons. The most common reason is to evaluate chest pain. Chest pain can be a symptom of coronary artery disease (CAD), and cardiac catheterization can show whether plaque is narrowing or blocking your heart's arteries. This procedure is also used to evaluate the valves, as well as measure the blood flow and oxygen levels in different parts of your heart. For further information please visit [email or web address] Please follow instruction sheet, as given.  Follow-Up: At [facility], you and your health needs are our priority.  As part of our continuing mission to provide you with exceptional heart care, our providers are all part of one team.  This team includes your primary Cardiologist (physician) and Advanced Practice Providers or APPs (Physician Assistants and Nurse Practitioners) who all work together to provide you with the care you need, when you need it.  Your next appointment:   As per structural heart team  Provider:   Lurena Red, MD or Izetta Hummer, PA-C    We recommend signing up for the patient portal called MyChart.  Sign up information is provided on this After Visit Summary.  MyChart is used to connect with  patients for Virtual Visits (Telemedicine).  Patients are able to view lab/test results, encounter notes, upcoming appointments, etc.  Non-urgent messages can be sent to your provider as well.   To learn more about what you can do with MyChart, go to [email or web address].   Other Instructions       Cardiac/Peripheral Catheterization   You are scheduled for a Cardiac Catheterization on Wednesday, October 29 with Dr. Lurena Red.  1. Please arrive at the [facility] (Main Entrance A) at [facility]: [address] [place], [place] [address] at 11:00 AM (This time is 2 hour(s) before your procedure to ensure your preparation). Your procedure is scheduled to begin at 1 PM.  Free valet parking service is available. You will check in at ADMITTING. The support person will be asked to wait in the waiting room.  It is OK to have someone drop you off and come back when you are ready to be discharged.        Special note: Every effort is made to have your procedure done on time. Please understand that emergencies sometimes delay scheduled procedures.  2. Diet: Nothing to eat after midnight.  3. Hydration:You need to be well hydrated before your procedure. On October 29, you may drink approved liquids (see below) until 2 hours before the procedure, with 16 oz of water  as your last intake.   List of approved liquids water , clear juice, clear tea, black coffee, fruit juices, non-citric and without pulp, carbonated beverages, Gatorade, Kool -Aid, plain Jello-O and plain ice popsicles.  4. Labs: You will need to have blood  drawn on Wednesday, October 22 at [facility] (1st Floor), [address] [place], [place] [address]. You do not need to be fasting.  5. Medication instructions in preparation for your procedure:   Contrast Allergy: No  Stop taking, Lisinopril -hydrochlorothiazide   Tuesday, October 28,   On the morning of your procedure,  take Aspirin  81 mg and any morning medicines NOT listed above.  You may use sips of water .  6. Plan to go home the same day, you will only stay overnight if medically necessary. 7. You MUST have a responsible adult to drive you home. 8. An adult MUST be with you the first 24 hours after you arrive home. 9. Bring a current list of your medications, and the last time and date medication taken. 10. Bring ID and current insurance cards. 11.Please wear clothes that are easy to get on and off and wear slip-on shoes.  Thank you for allowing us  to care for you!   -- [facility]

## 2024-09-06 ENCOUNTER — Emergency Department (HOSPITAL_COMMUNITY)

## 2024-09-06 ENCOUNTER — Inpatient Hospital Stay (HOSPITAL_COMMUNITY)
Admission: EM | Admit: 2024-09-06 | Discharge: 2024-09-09 | DRG: 378 | Disposition: A | Discharge status: Home / Self Care | Attending: Family Medicine | Admitting: Family Medicine

## 2024-09-06 ENCOUNTER — Encounter (HOSPITAL_COMMUNITY): Payer: Self-pay

## 2024-09-06 ENCOUNTER — Other Ambulatory Visit: Payer: Self-pay

## 2024-09-06 ENCOUNTER — Ambulatory Visit: Payer: Self-pay | Admitting: Internal Medicine

## 2024-09-06 ENCOUNTER — Telehealth: Payer: Self-pay

## 2024-09-06 DIAGNOSIS — R933 Abnormal findings on diagnostic imaging of other parts of digestive tract: Secondary | ICD-10-CM | POA: Diagnosis not present

## 2024-09-06 DIAGNOSIS — N179 Acute kidney failure, unspecified: Secondary | ICD-10-CM | POA: Diagnosis present

## 2024-09-06 DIAGNOSIS — Z87891 Personal history of nicotine dependence: Secondary | ICD-10-CM

## 2024-09-06 DIAGNOSIS — C180 Malignant neoplasm of cecum: Secondary | ICD-10-CM | POA: Diagnosis not present

## 2024-09-06 DIAGNOSIS — I2510 Atherosclerotic heart disease of native coronary artery without angina pectoris: Secondary | ICD-10-CM | POA: Diagnosis present

## 2024-09-06 DIAGNOSIS — D509 Iron deficiency anemia, unspecified: Secondary | ICD-10-CM

## 2024-09-06 DIAGNOSIS — I700 Atherosclerosis of aorta: Secondary | ICD-10-CM | POA: Diagnosis present

## 2024-09-06 DIAGNOSIS — Z7982 Long term (current) use of aspirin: Secondary | ICD-10-CM | POA: Diagnosis not present

## 2024-09-06 DIAGNOSIS — I420 Dilated cardiomyopathy: Secondary | ICD-10-CM | POA: Diagnosis present

## 2024-09-06 DIAGNOSIS — D62 Acute posthemorrhagic anemia: Secondary | ICD-10-CM | POA: Diagnosis present

## 2024-09-06 DIAGNOSIS — Z823 Family history of stroke: Secondary | ICD-10-CM

## 2024-09-06 DIAGNOSIS — K921 Melena: Secondary | ICD-10-CM | POA: Diagnosis present

## 2024-09-06 DIAGNOSIS — E66812 Obesity, class 2: Secondary | ICD-10-CM | POA: Diagnosis present

## 2024-09-06 DIAGNOSIS — K5730 Diverticulosis of large intestine without perforation or abscess without bleeding: Secondary | ICD-10-CM | POA: Diagnosis present

## 2024-09-06 DIAGNOSIS — I350 Nonrheumatic aortic (valve) stenosis: Secondary | ICD-10-CM

## 2024-09-06 DIAGNOSIS — D649 Anemia, unspecified: Secondary | ICD-10-CM | POA: Diagnosis present

## 2024-09-06 DIAGNOSIS — Z6837 Body mass index (BMI) 37.0-37.9, adult: Secondary | ICD-10-CM | POA: Diagnosis not present

## 2024-09-06 DIAGNOSIS — Z8546 Personal history of malignant neoplasm of prostate: Secondary | ICD-10-CM

## 2024-09-06 DIAGNOSIS — M109 Gout, unspecified: Secondary | ICD-10-CM | POA: Diagnosis present

## 2024-09-06 DIAGNOSIS — Z79899 Other long term (current) drug therapy: Secondary | ICD-10-CM

## 2024-09-06 DIAGNOSIS — N1832 Chronic kidney disease, stage 3b: Secondary | ICD-10-CM | POA: Diagnosis present

## 2024-09-06 DIAGNOSIS — R079 Chest pain, unspecified: Secondary | ICD-10-CM | POA: Diagnosis present

## 2024-09-06 DIAGNOSIS — I129 Hypertensive chronic kidney disease with stage 1 through stage 4 chronic kidney disease, or unspecified chronic kidney disease: Secondary | ICD-10-CM | POA: Diagnosis not present

## 2024-09-06 DIAGNOSIS — Z23 Encounter for immunization: Secondary | ICD-10-CM

## 2024-09-06 DIAGNOSIS — Z923 Personal history of irradiation: Secondary | ICD-10-CM

## 2024-09-06 DIAGNOSIS — E872 Acidosis, unspecified: Secondary | ICD-10-CM | POA: Diagnosis present

## 2024-09-06 DIAGNOSIS — Z888 Allergy status to other drugs, medicaments and biological substances status: Secondary | ICD-10-CM | POA: Diagnosis not present

## 2024-09-06 DIAGNOSIS — I5032 Chronic diastolic (congestive) heart failure: Secondary | ICD-10-CM | POA: Diagnosis present

## 2024-09-06 DIAGNOSIS — K920 Hematemesis: Secondary | ICD-10-CM | POA: Diagnosis not present

## 2024-09-06 DIAGNOSIS — R948 Abnormal results of function studies of other organs and systems: Secondary | ICD-10-CM | POA: Diagnosis present

## 2024-09-06 DIAGNOSIS — K922 Gastrointestinal hemorrhage, unspecified: Secondary | ICD-10-CM | POA: Diagnosis not present

## 2024-09-06 DIAGNOSIS — E8809 Other disorders of plasma-protein metabolism, not elsewhere classified: Secondary | ICD-10-CM | POA: Diagnosis present

## 2024-09-06 DIAGNOSIS — R5381 Other malaise: Secondary | ICD-10-CM | POA: Diagnosis present

## 2024-09-06 DIAGNOSIS — R262 Difficulty in walking, not elsewhere classified: Secondary | ICD-10-CM | POA: Diagnosis present

## 2024-09-06 DIAGNOSIS — I130 Hypertensive heart and chronic kidney disease with heart failure and stage 1 through stage 4 chronic kidney disease, or unspecified chronic kidney disease: Secondary | ICD-10-CM | POA: Diagnosis present

## 2024-09-06 DIAGNOSIS — Z9079 Acquired absence of other genital organ(s): Secondary | ICD-10-CM

## 2024-09-06 DIAGNOSIS — Z85830 Personal history of malignant neoplasm of bone: Secondary | ICD-10-CM

## 2024-09-06 DIAGNOSIS — D500 Iron deficiency anemia secondary to blood loss (chronic): Secondary | ICD-10-CM | POA: Diagnosis not present

## 2024-09-06 DIAGNOSIS — D122 Benign neoplasm of ascending colon: Secondary | ICD-10-CM | POA: Diagnosis not present

## 2024-09-06 DIAGNOSIS — Z8249 Family history of ischemic heart disease and other diseases of the circulatory system: Secondary | ICD-10-CM

## 2024-09-06 DIAGNOSIS — E1122 Type 2 diabetes mellitus with diabetic chronic kidney disease: Secondary | ICD-10-CM | POA: Diagnosis present

## 2024-09-06 DIAGNOSIS — K6389 Other specified diseases of intestine: Principal | ICD-10-CM | POA: Diagnosis present

## 2024-09-06 LAB — COMPREHENSIVE METABOLIC PANEL WITH GFR
ALT: 13 U/L (ref 0–44)
AST: 17 U/L (ref 15–41)
Albumin: 2.8 g/dL — ABNORMAL LOW (ref 3.5–5.0)
Alkaline Phosphatase: 48 U/L (ref 38–126)
Anion gap: 10 (ref 5–15)
BUN: 48 mg/dL — ABNORMAL HIGH (ref 8–23)
CO2: 18 mmol/L — ABNORMAL LOW (ref 22–32)
Calcium: 8.6 mg/dL — ABNORMAL LOW (ref 8.9–10.3)
Chloride: 114 mmol/L — ABNORMAL HIGH (ref 98–111)
Creatinine, Ser: 2.14 mg/dL — ABNORMAL HIGH (ref 0.61–1.24)
GFR, Estimated: 29 mL/min — ABNORMAL LOW (ref >60)
Glucose, Bld: 124 mg/dL — ABNORMAL HIGH (ref 70–99)
Potassium: 4.1 mmol/L (ref 3.5–5.1)
Sodium: 142 mmol/L (ref 135–145)
Total Bilirubin: 0.5 mg/dL (ref 0.0–1.2)
Total Protein: 5.3 g/dL — ABNORMAL LOW (ref 6.5–8.1)

## 2024-09-06 LAB — CBC
HCT: 21.2 % — ABNORMAL LOW (ref 39.0–52.0)
Hematocrit: 22 % — ABNORMAL LOW (ref 37.5–51.0)
Hemoglobin: 6.6 g/dL — CL (ref 13.0–17.7)
Hemoglobin: 6.5 g/dL — CL (ref 13.0–17.0)
MCH: 27.5 pg (ref 26.6–33.0)
MCH: 28 pg (ref 26.0–34.0)
MCHC: 30 g/dL — ABNORMAL LOW (ref 31.5–35.7)
MCHC: 30.7 g/dL (ref 30.0–36.0)
MCV: 92 fL (ref 79–97)
MCV: 91.4 fL (ref 80.0–100.0)
Platelets: 157 x10E3/uL (ref 150–450)
Platelets: 145 K/uL — ABNORMAL LOW (ref 150–400)
RBC: 2.4 x10E6/uL — CL (ref 4.14–5.80)
RBC: 2.32 MIL/uL — ABNORMAL LOW (ref 4.22–5.81)
RDW: 15.2 % (ref 11.6–15.4)
RDW: 16.5 % — ABNORMAL HIGH (ref 11.5–15.5)
WBC: 7.4 x10E3/uL (ref 3.4–10.8)
WBC: 7.3 K/uL (ref 4.0–10.5)
nRBC: 0 % (ref 0.0–0.2)

## 2024-09-06 LAB — BASIC METABOLIC PANEL WITH GFR
BUN/Creatinine Ratio: 25 — ABNORMAL HIGH (ref 10–24)
BUN: 50 mg/dL — ABNORMAL HIGH (ref 8–27)
CO2: 19 mmol/L — ABNORMAL LOW (ref 20–29)
Calcium: 9 mg/dL (ref 8.6–10.2)
Chloride: 109 mmol/L — ABNORMAL HIGH (ref 96–106)
Creatinine, Ser: 2.02 mg/dL — ABNORMAL HIGH (ref 0.76–1.27)
Glucose: 104 mg/dL — ABNORMAL HIGH (ref 70–99)
Potassium: 4.9 mmol/L (ref 3.5–5.2)
Sodium: 142 mmol/L (ref 134–144)
eGFR: 31 mL/min/1.73 — ABNORMAL LOW (ref >59)

## 2024-09-06 LAB — TROPONIN I (HIGH SENSITIVITY)
Troponin I (High Sensitivity): 13 ng/L (ref <18)
Troponin I (High Sensitivity): 11 ng/L (ref <18)

## 2024-09-06 LAB — PROTIME-INR
INR: 1.1 (ref 0.8–1.2)
Prothrombin Time: 15 s (ref 11.4–15.2)

## 2024-09-06 LAB — HEMOGLOBIN AND HEMATOCRIT, BLOOD
HCT: 23.3 % — ABNORMAL LOW (ref 39.0–52.0)
Hemoglobin: 7.5 g/dL — ABNORMAL LOW (ref 13.0–17.0)

## 2024-09-06 LAB — PREPARE RBC (CROSSMATCH)

## 2024-09-06 LAB — ABO/RH: ABO/RH(D): O POS

## 2024-09-06 LAB — POC OCCULT BLOOD, ED: Fecal Occult Bld: POSITIVE — AB

## 2024-09-06 MED ORDER — METOPROLOL TARTRATE 25 MG PO TABS
37.5000 mg | ORAL_TABLET | Freq: Two times a day (BID) | ORAL | Status: DC
  Administered 2024-09-06: 37.5 mg via ORAL
  Filled 2024-09-06: qty 2

## 2024-09-06 MED ORDER — SODIUM CHLORIDE 0.9% IV SOLUTION: Freq: Once | INTRAVENOUS | Status: DC

## 2024-09-06 MED ORDER — ALBUTEROL SULFATE (2.5 MG/3ML) 0.083% IN NEBU: 2.5000 mg | INHALATION_SOLUTION | RESPIRATORY_TRACT | Status: DC | PRN

## 2024-09-06 MED ORDER — SODIUM CHLORIDE 0.9% IV SOLUTION: Freq: Once | INTRAVENOUS | Status: AC

## 2024-09-06 MED ORDER — DIPHENHYDRAMINE HCL 25 MG PO CAPS
25.0000 mg | ORAL_CAPSULE | Freq: Once | ORAL | Status: AC
  Administered 2024-09-06: 25 mg via ORAL
  Filled 2024-09-06: qty 1

## 2024-09-06 MED ORDER — NA SULFATE-K SULFATE-MG SULF 17.5-3.13-1.6 GM/177ML PO SOLN
0.5000 | Freq: Once | ORAL | Status: AC
Start: 2024-09-06 — End: 2024-09-06
  Administered 2024-09-06: 177 mL via ORAL

## 2024-09-06 MED ORDER — LEUPROLIDE ACETATE (6 MONTH) 45 MG ~~LOC~~ KIT: 45.0000 mg | PACK | SUBCUTANEOUS | Status: DC

## 2024-09-06 MED ORDER — INFLUENZA VAC SPLIT HIGH-DOSE 0.5 ML IM SUSY
0.5000 mL | PREFILLED_SYRINGE | INTRAMUSCULAR | Status: AC
  Administered 2024-09-07: 0.5 mL via INTRAMUSCULAR
  Filled 2024-09-06: qty 0.5

## 2024-09-06 MED ORDER — SIMETHICONE 80 MG PO CHEW
240.0000 mg | CHEWABLE_TABLET | Freq: Once | ORAL | Status: DC
  Filled 2024-09-06: qty 3

## 2024-09-06 MED ORDER — MELATONIN 3 MG PO TABS: 6.0000 mg | ORAL_TABLET | Freq: Every evening | ORAL | Status: DC | PRN

## 2024-09-06 MED ORDER — ACETAMINOPHEN 325 MG PO TABS
650.0000 mg | ORAL_TABLET | Freq: Once | ORAL | Status: AC
  Administered 2024-09-06: 650 mg via ORAL
  Filled 2024-09-06: qty 2

## 2024-09-06 MED ORDER — DOXAZOSIN MESYLATE 4 MG PO TABS
4.0000 mg | ORAL_TABLET | Freq: Every day | ORAL | Status: DC
  Administered 2024-09-06 – 2024-09-09 (×4): 4 mg via ORAL
  Filled 2024-09-06 (×4): qty 1

## 2024-09-06 MED ORDER — SODIUM CHLORIDE 0.9 % IV SOLN: INTRAVENOUS | Status: AC

## 2024-09-06 MED ORDER — ACETAMINOPHEN 500 MG PO TABS
1000.0000 mg | ORAL_TABLET | Freq: Four times a day (QID) | ORAL | Status: DC | PRN
  Filled 2024-09-06: qty 2

## 2024-09-06 MED ORDER — DAROLUTAMIDE 300 MG PO TABS: 300.0000 mg | ORAL_TABLET | Freq: Two times a day (BID) | ORAL | Status: DC

## 2024-09-06 MED ORDER — POLYETHYLENE GLYCOL 3350 17 G PO PACK: 17.0000 g | PACK | Freq: Every day | ORAL | Status: DC | PRN

## 2024-09-06 MED ORDER — SODIUM CHLORIDE 0.9% FLUSH
3.0000 mL | Freq: Two times a day (BID) | INTRAVENOUS | Status: DC
  Administered 2024-09-06 – 2024-09-09 (×7): 3 mL via INTRAVENOUS

## 2024-09-06 MED ORDER — METOPROLOL TARTRATE 25 MG PO TABS
25.0000 mg | ORAL_TABLET | Freq: Two times a day (BID) | ORAL | Status: DC
  Administered 2024-09-06 – 2024-09-09 (×6): 25 mg via ORAL
  Filled 2024-09-06 (×6): qty 1

## 2024-09-06 MED ORDER — ALLOPURINOL 100 MG PO TABS
200.0000 mg | ORAL_TABLET | Freq: Every day | ORAL | Status: DC
  Administered 2024-09-06 – 2024-09-09 (×4): 200 mg via ORAL
  Filled 2024-09-06 (×4): qty 2

## 2024-09-06 MED ORDER — SIMETHICONE 80 MG PO CHEW
240.0000 mg | CHEWABLE_TABLET | Freq: Once | ORAL | Status: AC
  Administered 2024-09-06: 240 mg via ORAL
  Filled 2024-09-06: qty 3

## 2024-09-06 MED ORDER — ACETAMINOPHEN 325 MG PO TABS: 650.0000 mg | ORAL_TABLET | Freq: Once | ORAL | Status: DC

## 2024-09-06 MED ORDER — BISACODYL 5 MG PO TBEC
10.0000 mg | DELAYED_RELEASE_TABLET | Freq: Once | ORAL | Status: AC
  Administered 2024-09-06: 10 mg via ORAL
  Filled 2024-09-06: qty 2

## 2024-09-06 MED ORDER — ONDANSETRON HCL 4 MG/2ML IJ SOLN: 4.0000 mg | Freq: Four times a day (QID) | INTRAMUSCULAR | Status: DC | PRN

## 2024-09-06 MED ORDER — FUROSEMIDE 10 MG/ML IJ SOLN
20.0000 mg | Freq: Once | INTRAMUSCULAR | Status: AC
  Administered 2024-09-06: 20 mg via INTRAVENOUS
  Filled 2024-09-06: qty 2

## 2024-09-06 MED ORDER — ROSUVASTATIN CALCIUM 5 MG PO TABS
5.0000 mg | ORAL_TABLET | Freq: Every day | ORAL | Status: DC
  Administered 2024-09-06 – 2024-09-09 (×4): 5 mg via ORAL
  Filled 2024-09-06 (×4): qty 1

## 2024-09-06 MED ORDER — NA SULFATE-K SULFATE-MG SULF 17.5-3.13-1.6 GM/177ML PO SOLN
0.5000 | Freq: Once | ORAL | Status: AC
  Administered 2024-09-06: 177 mL via ORAL
  Filled 2024-09-06: qty 1

## 2024-09-06 NOTE — Telephone Encounter (Signed)
 Received critical alert that labs drawn earlier today with cardiology (in context of pre TAVR evaluation) showed a Hgb of 6.6. This is from 7.4 at most recent check at [facility]. Appears that Hgb has been decreasing for a while and that he has been getting iron infusions. However, he is now reporting blood per rectum (unclear if bright or dark red). Symptoms include significant fatigue and inability to exert himself. I recommended he go to nearest ER to be evaluated given bleeding a acute on chronic anemia with Hgb <7. I told him to hold his aspirin . He acknowledged understanding.

## 2024-09-06 NOTE — ED Triage Notes (Signed)
 Pt is POV with wife d/t Low HGB by Cardiologist.  Pt did report blood in stool.

## 2024-09-06 NOTE — Consult Note (Addendum)
 Consultation  Referring Provider: ER MD/MC/GroceE PA-C Primary Care Physician:  [name], NP Primary Gastroenterologist: Sampson  Reason for Consultation: Profound anemia, intermittent rectal bleeding  HPI: Jeremiah Collins is a 89 y.o. male who was admitted through the emergency room last evening after outpatient labs done at cardiology visit yesterday showed hemoglobin of 6.5. Patient has history of metastatic prostate cancer, diabetes mellitus, dilated cardiomyopathy, chronic kidney disease and aortic valve disease as well as hypertension, and history of hydronephrosis status post left ureteral stent.  He also has previous history of iron deficiency anemia for which he had been undergoing iron infusions. Patient says that he has noticed some blood in his stool sporadically over the past year.  He also has noticed some change in his bowel habits with some looser or softer stool though not necessarily going more frequently.  He has also had abdominal discomfort postprandially which will bother him for a couple of hours after eating, then resolves and usually will recur after he eats again.  He does feel that this is more localized to his right lower abdomen.  Denies any nausea or vomiting, appetite has been okay he apparently did have some chest discomfort yesterday.  Patient is on baby aspirin  daily no other blood thinners. He has not had prior colonoscopy.  Workup in the ER last evening with labs shows WBC of 7.4/hemoglobin 6.6/hematocrit of 22/MCV 92 BUN 50/creatinine 2.02/potassium 4.9  LFTs today within normal limits   Patient anemia panel from 08/28/2024 showed serum iron of 29/transferrin 227/ferritin 132 TIBC 325/iron sat 9  Patient received 1 unit of packed RBCs last evening  Labs pending this a.m.  CT of the abdomen pelvis done without contrast last evening shows a mass versus inflammation at the junction of the terminal ileum and cecum measuring approximately  3 cm, there is surrounding patchy mesenteric stranding and small regional mesenteric lymph nodes, no upstream dilation.  There is chronic left renal atrophy with indwelling left double-J stent and moderate sigmoid diverticulosis.     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/28/2006   Bone metastases    left mid tibial shaft   Cataract    Dr.Groat   Cellulitis and abscess of leg, except foot 01/17/2012   Chest pain, unspecified 04/28/2004   Closed fracture of five ribs 03/24/2004   First degree atrioventricular block 04/25/2007   Gross hematuria 09/06/2011   History of radiation therapy 04/03/14- 04/17/14   mid to distal left tibia 3000 cGy in 10 sessions   HTN (hypertension)    Hx of radiation therapy 11/1998   prostate fossa - 6040 cGy, 33 fx, Dr Johney   Osteoarthrosis involving, or with mention of more than one site, but not specified as generalized, multiple sites 10/22/2010   Other abnormal blood chemistry 04/29/1991   Other and unspecified hyperlipidemia 01/02/2013   Palpitations 04/28/2004   Prostate cancer (HCC) 12/16/1994   gleason 7   Reflux esophagitis 05/10/2008   Routine general medical examination at a health care facility    Spinal stenosis, unspecified region other than cervical 04/29/1995   Type II or unspecified type diabetes mellitus without mention of complication, uncontrolled     Past Surgical History:  Procedure Laterality Date   AIR/FLUID EXCHANGE Left 01/09/2016   Procedure: AIR/FLUID EXCHANGE;  Surgeon: Onesimo Blanch, MD;  Location: [facility] OR;  Service: Ophthalmology;  Laterality: Left;   CYSTOSCOPY W/ URETERAL STENT PLACEMENT  12/31/14  CYSTOSCOPY W/ URETERAL STENT PLACEMENT  05/16/2015   EYE SURGERY  2006   cataract, Dr Loras   LASER PHOTO ABLATION Left 01/09/2016   Procedure: LASER PHOTO ABLATION;  Surgeon: Onesimo Blanch, MD;  Location: [facility] OR;  Service: Ophthalmology;  Laterality: Left;  Endolaser   Left  ureteral stent placement  Week of 12/05/11   PARS PLANA VITRECTOMY Left 01/09/2016   Procedure: PARS PLANA VITRECTOMY WITH 25 GAUGE LEFT EYE ;  Surgeon: Onesimo Blanch, MD;  Location: [facility] OR;  Service: Ophthalmology;  Laterality: Left;   PERFLUORONE INJECTION Left 01/09/2016   Procedure: PERFLUORONE INJECTION;  Surgeon: Onesimo Blanch, MD;  Location: [facility] OR;  Service: Ophthalmology;  Laterality: Left;   PROSTATECTOMY  1996   [name]   URETERAL STENT PLACEMENT  05/2020   [facility] every 3 months    URETERAL STENT PLACEMENT  09/23/2020   Patient gets replaced every 3 months     Prior to Admission medications   Medication Sig Start Date End Date Taking? Authorizing Provider  allopurinol  (ZYLOPRIM ) 100 MG tablet TAKE 2 TABLETS BY MOUTH EVERY DAY 02/27/24  Yes Eubanks, Jessica K, NP  aspirin  81 MG tablet Take 1 tablet (81 mg total) by mouth daily. 01/14/15  Yes Arnetha Heft, MD  Calcium  Carb-Cholecalciferol  600-5 MG-MCG TABS Take 2 tablets by mouth daily. 09/07/21  Yes [provider]  Cholecalciferol  1000 UNITS tablet Take 1,000 Units by mouth daily.   Yes [provider]  darolutamide (NUBEQA) 300 MG tablet Take 300 mg by mouth in the morning and at bedtime. 06/12/24 06/12/25 Yes [provider]  doxazosin  (CARDURA ) 4 MG tablet TAKE 1 TABLET BY MOUTH EVERY DAY 04/26/24  Yes Eubanks, Jessica K, NP  leuprolide , 6 Month, (ELIGARD ) 45 MG injection Inject 45 mg into the skin every 6 (six) months. Filled by [facility] 08/20/15  Yes Franchot Hough, DO  lisinopril -hydrochlorothiazide  (ZESTORETIC ) 20-12.5 MG tablet TAKE 2 TABLETS BY MOUTH EVERY MORNING 02/27/24  Yes Eubanks, Jessica K, NP  metoprolol  tartrate (LOPRESSOR ) 50 MG tablet TAKE 1 TABLET BY MOUTH TWICE A DAY 04/26/24  Yes Eubanks, Jessica K, NP  Omega-3 Fatty Acids (FISH OIL ) 1000 MG CAPS Take 1 capsule by mouth daily.   Yes [provider]  pyridoxine (B-6) 100 MG tablet Take 100 mg by mouth daily.   Yes  [provider]  rosuvastatin  (CRESTOR ) 5 MG tablet Take 5 mg by mouth daily. 06/12/24 06/12/25 Yes [provider]  glucose blood (ONETOUCH VERIO) test strip Check blood sugar one to two times a week. Dx:E11.29 10/21/23   [name], NP    Current Facility-Administered Medications  Medication Dose Route Frequency Provider Last Rate Last Admin   acetaminophen  (TYLENOL ) tablet 1,000 mg  1,000 mg Oral Q6H PRN Segars, Dorn, MD       albuterol  (PROVENTIL ) (2.5 MG/3ML) 0.083% nebulizer solution 2.5 mg  2.5 mg Nebulization Q4H PRN Segars, Jonathan, MD       allopurinol  (ZYLOPRIM ) tablet 200 mg  200 mg Oral Daily Segars, Dorn, MD       melatonin tablet 6 mg  6 mg Oral QHS PRN Segars, Jonathan, MD       metoprolol  tartrate (LOPRESSOR ) tablet 37.5 mg  37.5 mg Oral BID Segars, Jonathan, MD       ondansetron  (ZOFRAN ) injection 4 mg  4 mg Intravenous Q6H PRN Segars, Dorn, MD       polyethylene glycol (MIRALAX  / GLYCOLAX ) packet 17 g  17 g Oral Daily  PRN Segars, Jonathan, MD       rosuvastatin  (CRESTOR ) tablet 5 mg  5 mg Oral Daily Segars, Dorn, MD       sodium chloride  flush (NS) 0.9 % injection 3 mL  3 mL Intravenous Q12H Segars, Jonathan, MD       Current Outpatient Medications  Medication Sig Dispense Refill   allopurinol  (ZYLOPRIM ) 100 MG tablet TAKE 2 TABLETS BY MOUTH EVERY DAY 180 tablet 3   aspirin  81 MG tablet Take 1 tablet (81 mg total) by mouth daily. 30 tablet 3   Calcium  Carb-Cholecalciferol  600-5 MG-MCG TABS Take 2 tablets by mouth daily.     Cholecalciferol  1000 UNITS tablet Take 1,000 Units by mouth daily.     darolutamide (NUBEQA) 300 MG tablet Take 300 mg by mouth in the morning and at bedtime.     doxazosin  (CARDURA ) 4 MG tablet TAKE 1 TABLET BY MOUTH EVERY DAY 90 tablet 3   leuprolide , 6 Month, (ELIGARD ) 45 MG injection Inject 45 mg into the skin every 6 (six) months. Filled by [facility] 1 each 1   lisinopril -hydrochlorothiazide   (ZESTORETIC ) 20-12.5 MG tablet TAKE 2 TABLETS BY MOUTH EVERY MORNING 180 tablet 3   metoprolol  tartrate (LOPRESSOR ) 50 MG tablet TAKE 1 TABLET BY MOUTH TWICE A DAY 180 tablet 3   Omega-3 Fatty Acids (FISH OIL ) 1000 MG CAPS Take 1 capsule by mouth daily.     pyridoxine (B-6) 100 MG tablet Take 100 mg by mouth daily.     rosuvastatin  (CRESTOR ) 5 MG tablet Take 5 mg by mouth daily.     glucose blood (ONETOUCH VERIO) test strip Check blood sugar one to two times a week. Dx:E11.29 100 each 3    Allergies as of 09/06/2024 - Review Complete 09/06/2024  Allergen Reaction Noted   Abiraterone Nausea And Vomiting and Other (See Comments) 05/30/2014    Family History  Problem Relation Age of Onset   Heart disease Father    Stroke Sister     Social History   Socioeconomic History   Marital status: Married    Spouse name: Not on file   Number of children: Not on file   Years of education: Not on file   Highest education level: Not on file  Occupational History   Not on file  Tobacco Use   Smoking status: Former    Types: Cigars    Quit date: 12/16/1968    Years since quitting: 55.7   Smokeless tobacco: Never  Vaping Use   Vaping status: Never Used  Substance and Sexual Activity   Alcohol use: No    Alcohol/week: 0.0 standard drinks of alcohol   Drug use: No   Sexual activity: Never  Other Topics Concern   Not on file  Social History Narrative   Not on file   Social Drivers of Health   Financial Resource Strain: Low Risk  (09/15/2018)   Overall Financial Resource Strain (CARDIA)    Difficulty of Paying Living Expenses: Not hard at all  Food Insecurity: No Food Insecurity (09/15/2018)   Hunger Vital Sign    Worried About Running Out of Food in the Last Year: Never true    Ran Out of Food in the Last Year: Never true  Transportation Needs: No Transportation Needs (09/15/2018)   PRAPARE - Administrator, Civil Service (Medical): No    Lack of Transportation  (Non-Medical): No  Physical Activity: Inactive (09/15/2018)   Exercise Vital Sign    Days of  Exercise per Week: 0 days    Minutes of Exercise per Session: 0 min  Stress: No Stress Concern Present (09/15/2018)   [employer] of Occupational Health - Occupational Stress Questionnaire    Feeling of Stress : Not at all  Social Connections: Somewhat Isolated (09/15/2018)   Social Connection and Isolation Panel    Frequency of Communication with Friends and Family: Three times a week    Frequency of Social Gatherings with Friends and Family: Once a week    Attends Religious Services: Never    Database administrator or Organizations: No    Attends Banker Meetings: Never    Marital Status: Married  Catering manager Violence: Not At Risk (09/15/2018)   Humiliation, Afraid, Rape, and Kick questionnaire    Fear of Current or Ex-Partner: No    Emotionally Abused: No    Physically Abused: No    Sexually Abused: No    Review of Systems: Pertinent positive and negative review of systems were noted in the above HPI section.  All other review of systems was otherwise negative.   Physical Exam: Vital signs in last 24 hours: Temp:  [97.7 F (36.5 C)-98 F (36.7 C)] 98 F (36.7 C) (10/23 0730) Pulse Rate:  [55-65] 59 (10/23 0800) Resp:  [16-24] 21 (10/23 0800) BP: (110-143)/(48-64) 135/63 (10/23 0800) SpO2:  [96 %-100 %] 100 % (10/23 0800) Weight:  [120.4 kg] 120.4 kg (10/23 0154)   General:   Alert,  Well-developed, well-nourished, elderly white male pleasant and cooperative in NAD, wife at bedside Head:  Normocephalic and atraumatic. Eyes:  Sclera clear, no icterus.   Conjunctiva AL. Ears:  Normal auditory acuity. Nose:  No deformity, discharge,  or lesions. Mouth:  No deformity or lesions.   Neck:  Supple; no masses or thyromegaly. Lungs:  Clear throughout to auscultation.   No wheezes, crackles, or rhonchi.  Heart:  Regular rate and rhythm; 3/6 systolic murmur  blowing Abdomen:  Soft, obese, no definite tenderness with palpation in the right lower abdomen, BS active,nonpalp mass or hsm.   Rectal: Documented heme positive Msk:  Symmetrical without gross deformities. . Pulses:  Normal pulses noted. Extremities:  Without clubbing or edema. Neurologic:  Alert and  oriented x4;  grossly normal neurologically. Skin:  Intact without significant lesions or rashes.. Psych:  Alert and cooperative. Normal mood and affect.  Intake/Output from previous day: No intake/output data recorded. Intake/Output this shift: No intake/output data recorded.  Lab Results: Recent Labs    09/05/24 1033 09/06/24 0203  WBC 7.4 7.3  HGB 6.6* 6.5*  HCT 22.0* 21.2*  PLT 157 145*   BMET Recent Labs    09/05/24 1033 09/06/24 0203  NA 142 142  K 4.9 4.1  CL 109* 114*  CO2 19* 18*  GLUCOSE 104* 124*  BUN 50* 48*  CREATININE 2.02* 2.14*  CALCIUM  9.0 8.6*   LFT Recent Labs    09/06/24 0203  PROT 5.3*  ALBUMIN 2.8*  AST 17  ALT 13  ALKPHOS 48  BILITOT 0.5   PT/INR No results for input(s): LABPROT, INR in the last 72 hours. Hepatitis Panel No results for input(s): HEPBSAG, HCVAB, HEPAIGM, HEPBIGM in the last 72 hours.   IMPRESSION:  #83 89 year old white male referred to the emergency room for admission after outpatient labs yesterday showed hemoglobin of 6.5 (down from his baseline in the 9-10 range) Patient reports that he has noticed intermittent blood mixed with his bowel movements over the past year,  and some mild change in bowel habits with looser stool as well as onset of postprandial lower abdominal discomfort over the past couple of months probably more located in the right lower quadrant.  CT imaging noncontrasted last evening concerning for a mass versus inflammatory process at the junction of the terminal ileum and the cecum with mild regional mesenteric inflammation and small regional mesenteric nodes.  Patient also has previous  diagnosis of iron deficiency anemia for which he had been undergoing iron infusions.  Rule out cecal malignancy versus less likely IBD   #2 metastatic prostate cancer #3 chronic kidney disease stage IIIb #4.  Diabetes mellitus #5.  Hypertension #6.  Chronic hydronephrosis/status post double-J stent left  #7.  Aortic valve stenosis-cardiology in process of pursuing right heart catheterization coronary angiography/TAVR protocol CTA and vascular surgery appointment.    PLAN: Transfuse 1 more unit of packed RBCs, then continue to trend hemoglobin Clear liquid diet today, then n.p.o. past midnight Patient will be scheduled for colonoscopy with Dr. Legrand for tomorrow 09/07/2024.  Plan bowel prep this evening.  Procedure was discussed in detail with the patient including indications risks and benefits and he is agreeable to proceed.  GI will follow with you He is followed by Dr. Thukkani/Cardiology-see note from yesterday we may want to communicate with cardiology to confirm reasonable to pursue sedation and colonoscopy prior to planned upcoming cardiac evaluation   Amy Esterwood PA-C 09/06/2024, 9:29 AM   I have taken an interval history, thoroughly reviewed the chart and examined the patient. I agree with the Advanced Practitioner's note, impression and recommendations, and have recorded additional findings, impressions and recommendations below. I performed a substantive portion of this encounter (>50% time spent), including a complete performance of the medical decision making.  My additional thoughts are as follows:  89 year old man with multiple medical issues as noted above here for acute on chronic iron deficiency anemia with about a year of intermittent rectal bleeding and lower abdominal pain and a CT scan showing either an inflammatory or neoplastic process at the ileocecal junction. While inflammatory bowel disease is possible, malignancy is a more worrisome possibility.  He  has at least moderate aortic stenosis and is in the process of workup for consideration of TAVR.  Those plans are now on hold while working up the anemia and CT scan finding.  Cardiology consultant was present during my ED visit with this patient and his wife, and they agree with that plan.  Cardiac catheterization that was planned for next week is currently on hold pending findings of colonoscopy. Patient is also concerned that he has an upcoming ureteral stent change, but timing of that also to be determined based on colonoscopy findings and subsequent plan.  He has received PRBC transfusion (we will make sure that it is a total of 2 units given his age and cardiac comorbidity), and hopefully he can get through a bowel preparation overnight for colonoscopy with me tomorrow.  That procedure was described in detail along with the rationale, risks and benefits and he was agreeable with his wife in attendance at the bedside.  The benefits and risks of the planned procedure(s) were described in detail with the patient or (when appropriate) their health care proxy.  Risks were outlined as including, but not limited to, bleeding, infection, perforation, adverse medication reaction leading to cardiac or pulmonary decompensation, pancreatitis (if ERCP).  The limitation of incomplete mucosal visualization was also discussed.  No guarantees or warranties were given.  Patient  at increased risk for cardiopulmonary complications of procedure due to medical comorbidities.  _________________  This consultation required a high degree of medical decision making due to the nature and complexity of the acute condition(s) being evaluated as well as the patient's medical comorbidities.  Victory LITTIE Brand III [phone]

## 2024-09-06 NOTE — ED Provider Notes (Signed)
 [place] EMERGENCY DEPARTMENT AT [facility] Provider Note   CSN: [id number] Arrival date & time: 09/06/24  0145     Patient presents with: abnormal labs   Jeremiah Collins is a 89 y.o. male with medical history of palpitations, unspecified chest pain, hypertension, prostate cancer, type 2 diabetes, edema bilateral lower extremities, dilated cardiomyopathy, gout, CKD.  Presents to ED complaining of low hemoglobin, abnormal lab.  States that he was advised by his cardiologist today after having office appointment that his hemoglobin was low.  He reports that he feels as if he has had blood in his stool for the last 1 year.  He reports that these episodes will wax and wane, they are not consistent.  He reports that sometimes he will wipe and there was blood on toilet paper and other times really mixed into the bowl and his stools appear darker.  Reports this have been ongoing for the last 1 year.  Also endorsing abdominal pain for the last 6 weeks which is located in his lower abdominal fields.  He denies nausea, vomiting, diarrhea.  He endorses chest pain which occurred today but denies any shortness of breath beyond his baseline.  He denies any new leg swelling.  He denies anticoagulation, states he takes a daily baby aspirin .  He endorses weakness but denies lightheadedness or dizziness.  Denies history of blood transfusions.  Reports he is set to have iron infusion tomorrow [facility] and set to have cystoscopy on Monday to replace kidney stent.  Patient has never had a colonoscopy per patient.  HPI     Prior to Admission medications   Medication Sig Start Date End Date Taking? Authorizing Provider  allopurinol  (ZYLOPRIM ) 100 MG tablet TAKE 2 TABLETS BY MOUTH EVERY DAY 02/27/24   Eubanks, Jessica K, NP  aspirin  81 MG tablet Take 1 tablet (81 mg total) by mouth daily. 01/14/15   Arnetha Heft, MD  Calcium  Carb-Cholecalciferol  600-5 MG-MCG TABS Take 2 tablets by mouth daily.  09/07/21   [provider]  Cholecalciferol  1000 UNITS tablet Take 1,000 Units by mouth daily.    [provider]  darolutamide (NUBEQA) 300 MG tablet Take 300 mg by mouth in the morning and at bedtime. 06/12/24 06/12/25  [provider]  doxazosin  (CARDURA ) 4 MG tablet TAKE 1 TABLET BY MOUTH EVERY DAY 04/26/24   Eubanks, Jessica K, NP  glucose blood (ONETOUCH VERIO) test strip Check blood sugar one to two times a week. Dx:E11.29 10/21/23   [name], NP  leuprolide , 6 Month, (ELIGARD ) 45 MG injection Inject 45 mg into the skin every 6 (six) months. Filled by [facility] 08/20/15   Franchot Hough, DO  lisinopril -hydrochlorothiazide  (ZESTORETIC ) 20-12.5 MG tablet TAKE 2 TABLETS BY MOUTH EVERY MORNING 02/27/24   Eubanks, Jessica K, NP  metoprolol  tartrate (LOPRESSOR ) 50 MG tablet TAKE 1 TABLET BY MOUTH TWICE A DAY 04/26/24   Eubanks, Jessica K, NP  Omega-3 Fatty Acids (FISH OIL ) 1000 MG CAPS Take 1 capsule by mouth daily.    [provider]  pyridoxine (B-6) 100 MG tablet Take 100 mg by mouth daily.    [provider]  rosuvastatin  (CRESTOR ) 5 MG tablet Take 5 mg by mouth daily. 06/12/24 06/12/25  [provider]    Allergies: Abiraterone    Review of Systems  Cardiovascular:  Positive for chest pain.  Gastrointestinal:  Positive for abdominal pain, blood in stool, nausea and vomiting.  Neurological:  Positive for weakness.  All other systems reviewed and are negative.   Updated Vital Signs BP (!) 131/59   Pulse (!) 57   Temp 98 F (36.7 C) (Oral)   Resp 17   Ht 5' 11 (1.803 m)   Wt 120.4 kg   SpO2 100%   BMI 37.02 kg/m   Physical Exam Vitals and nursing note reviewed.  Constitutional:      General: He is not in acute distress.    Appearance: He is well-developed.  HENT:     Head: Normocephalic and atraumatic.  Eyes:     Conjunctiva/sclera: Conjunctivae normal.  Cardiovascular:     Rate and Rhythm: Normal rate and  regular rhythm.     Heart sounds: No murmur heard. Pulmonary:     Effort: Pulmonary effort is normal. No respiratory distress.     Breath sounds: Normal breath sounds.  Abdominal:     Palpations: Abdomen is soft.     Tenderness: There is no abdominal tenderness.  Musculoskeletal:        General: No swelling.     Cervical back: Neck supple.     Right lower leg: Edema present.     Left lower leg: Edema present.     Comments: Bilateral edema to lower extremities, patient reports baseline  Skin:    General: Skin is warm and dry.     Capillary Refill: Capillary refill takes less than 2 seconds.  Neurological:     Mental Status: He is alert and oriented to person, place, and time. Mental status is at baseline.  Psychiatric:        Mood and Affect: Mood normal.     (all labs ordered are listed, but only abnormal results are displayed) Labs Reviewed  COMPREHENSIVE METABOLIC PANEL WITH GFR - Abnormal; Notable for the following components:      Result Value   Chloride 114 (*)    CO2 18 (*)    Glucose, Bld 124 (*)    BUN 48 (*)    Creatinine, Ser 2.14 (*)    Calcium  8.6 (*)    Total Protein 5.3 (*)    Albumin 2.8 (*)    GFR, Estimated 29 (*)    All other components within normal limits  CBC - Abnormal; Notable for the following components:   RBC 2.32 (*)    Hemoglobin 6.5 (*)    HCT 21.2 (*)    RDW 16.5 (*)    Platelets 145 (*)    All other components within normal limits  POC OCCULT BLOOD, ED - Abnormal; Notable for the following components:   Fecal Occult Bld POSITIVE (*)    All other components within normal limits  TYPE AND SCREEN  ABO/RH  PREPARE RBC (CROSSMATCH)  TROPONIN I (HIGH SENSITIVITY)  TROPONIN I (HIGH SENSITIVITY)    EKG: None  Radiology: CT ABDOMEN PELVIS WO CONTRAST Result Date: 09/06/2024 EXAM: CT ABDOMEN AND PELVIS WITHOUT CONTRAST 09/06/2024 04:40:24 AM TECHNIQUE: CT of the abdomen and pelvis was performed without the administration of intravenous  contrast. Multiplanar reformatted images are provided for review. Automated exposure control, iterative reconstruction, and/or weight-based adjustment of the mA/kV was utilized to reduce the radiation dose to as low as reasonably achievable. COMPARISON: CT abdomen and pelvis 12/10/2012. CLINICAL HISTORY: 89 year old male. Acute nonlocalized abdominal pain, low HGB (hemoglobin), and reported blood in stool. FINDINGS: LOWER CHEST: Chronic cardiomegaly is stable. No pericardial effusion. Advanced calcified atherosclerosis including aortoiliac involvement. Normal caliber abdominal aorta. Lung base ventilation improved. Chronic lower  rib fractures. Mild lung base atelectasis or scarring. LIVER: The liver is unremarkable. GALLBLADDER AND BILE DUCTS: Contracted gallbladder. No biliary ductal dilatation. SPLEEN: No acute abnormality. PANCREAS: Pancreatic fatty atrophy. ADRENAL GLANDS: Chronic left adrenal gland myelolipoma, benign and stable since 2014 (no follow up imaging recommended). KIDNEYS, URETERS AND BLADDER: Left renal double J ureteral stent in place, similar to the appearance in 2014. Left renal cortical atrophy since that time. No acute hydronephrosis or hydroureter. Contralateral right renal volume is better maintained although also mildly decreased. Double J stent is appropriately positioned. Urinary bladder is diminutive. No stones in the kidneys or ureters. No perinephric or periureteral stranding. GI AND BOWEL: Stomach demonstrates no acute abnormality. Decompressed stomach and duodenum. Moderate sigmoid diverticulosis, no active inflammation. Relatively mild retained stool in the large bowel. Normal appendix on series 3 image 56. Indistinct abnormal soft tissue thickening and inflammation at the ileocecal valve on series 3 image 43. Surrounding patchy mesenteric stranding. This is a short segment abnormality, encompassing about 3 cm of both the distal terminal ileum and cecum. There is a nodular soft  tissue configuration (coronal image 79). Small regional mesenteric lymph nodes in addition to the mild mesenteric stranding (coronal image 71). Acute mass versus inflammation at the junction of the terminal ileum and cecum (IC valve) on this noncontrast exam. Mild to moderate regional mesenteric inflammation. Upstream small bowel loops are nondilated and appear normal. There is no bowel obstruction. No pneumoperitoneum or free fluid. PERITONEUM AND RETROPERITONEUM: No ascites. No free air. VASCULATURE: Aorta is normal in caliber. Advanced calcified atherosclerosis including aortoiliac involvement. LYMPH NODES: Small regional mesenteric lymph nodes in addition to the mild mesenteric stranding (coronal image 71). REPRODUCTIVE ORGANS: Chronic prostate therapy and or surgical clips stable. No pelvis recluse. BONES AND SOFT TISSUES: Diffuse osteopenia. Hyperostosis in the spine with intermittent levels of spinal ankylosis. Chronic lower rib fractures. Small chronic fat containing umbilical hernia. No herniated bowel. No focal soft tissue abnormality. IMPRESSION: 1. Acute Mass versus Inflammation at the junction of the terminal ileum and cecum (IC valve, series 3 image 53) on this noncontrast exam. Mild regional mesenteric inflammation. No regional lymphadenopathy. Colonoscopy may be necessary to exclude Tumor. 2. No other acute finding. Chronic left renal atrophy with indwelling left double-J stent. Moderate sigmoid diverticulosis without evidence of diverticulitis. Advanced aortic calcified atherosclerosis. Electronically signed by: Helayne Hurst MD 09/06/2024 05:02 AM EDT RP Workstation: [id number]    .Critical Care  Performed by: Ruthell Lonni FALCON, PA-C Authorized by: Ruthell Lonni FALCON, PA-C   Critical care provider statement:    Critical care time (minutes):  45   Critical care time was exclusive of:  Separately billable procedures and treating other patients   Critical care was necessary to treat or  prevent imminent or life-threatening deterioration of the following conditions: Symptomatic anemia 6.5.   Critical care was time spent personally by me on the following activities:  Blood draw for specimens, development of treatment plan with patient or surrogate, discussions with consultants, discussions with primary provider, evaluation of patient's response to treatment, examination of patient, interpretation of cardiac output measurements, obtaining history from patient or surrogate, ordering and performing treatments and interventions, ordering and review of laboratory studies, ordering and review of radiographic studies, pulse oximetry, re-evaluation of patient's condition and review of old charts   I assumed direction of critical care for this patient from another provider in my specialty: no     Care discussed with: admitting provider      Medications  Ordered in the ED  0.9 %  sodium chloride  infusion (Manually program via Guardrails IV Fluids) ( Intravenous New Bag/Given 09/06/24 0455)     Medical Decision Making Amount and/or Complexity of Data Reviewed Labs: ordered. Radiology: ordered. ECG/medicine tests: ordered.  Risk Prescription drug management. Decision regarding hospitalization.   89 year old presenting to the ED due to concern of low hemoglobin.  On exam, patient is afebrile and nontachycardic.  Lung sounds are clear bilaterally, no hypoxia.  Abdomen soft and compressible.  Neurological examination at baseline.  Overall nontoxic in appearance.  Labs collected.  CBC with no leukocytosis, hemoglobin 6.5.  Metabolic panel with baseline creatinine 2.14, GFR 29, anion gap 10.  Sodium, potassium WNL.  Albumin decreased 2.8.  No elevated LFT.  Troponin 13, delta pending.  EKG pending.  Fecal occult positive stool.  CT scan obtained which shows acute mass versus inflammation at the junction of terminal ileum and cecum on this noncontrast exam.  There is mild regional mesenteric  inflammation with no regional lymphadenopathy.  Colonoscopy may be necessary to exclude tumor.  No other acute finding.  Patient given 1 unit of blood for symptomatic anemia.  Patient requires admission.  Denies any anticoagulation.  He was discussed with Dr. Keturah of the [facility] hospitalist service.  I have sent a secure chat to on-call GI provider [facility] requesting consultation.  Patient is amenable to plan.  Patient has been made aware of his exam findings.  He is stable on admission.    Final diagnoses:  Colonic mass  Symptomatic anemia  Blood in stool    ED Discharge Orders     None          Ruthell Lonni FALCON, PA-C 09/06/24 0545    Midge Golas, MD 09/06/24 [phone]

## 2024-09-06 NOTE — H&P (Addendum)
 History and Physical    DEVUN ANNA FMW:[id number] DOB: 1934-08-25 DOA: 09/06/2024  PCP: [name], NP   Patient coming from: Home   Chief Complaint:  Chief Complaint  Patient presents with   abnormal labs    HPI:  Jeremiah Collins is a 89 y.o. male with hx of metastatic prostate cancer, hx IDA, low flow-low gradient AS, HFpEF, aortic atherosclerosis, CKD3b, DM2, HTN, HLD, hx hydronephrosis requiring ureteral stent, neuropathy, gout, who was referred to ED after labs at cardiology appt with severe anemia. He reports having dark red blood in stool which has been ongoing over the past year intermittently. He is not clear if this has worsened recently. He has associated lower abd pain. He reports associated lightheadedness, dizziness. Has chronic exertional chest pain x years which he says is unchanged. He is taking aspirin  for secondary ASCVD prevention. Is seen by hematology for IDA and receives iron infusions outpatient. He has never had a colonoscopy in the past     Review of Systems:  ROS complete and negative except as marked above   Allergies  Allergen Reactions   Abiraterone Nausea And Vomiting and Other (See Comments)    Other reaction(s): Increased Heart Rate (intolerance)     Prior to Admission medications   Medication Sig Start Date End Date Taking? Authorizing Provider  allopurinol  (ZYLOPRIM ) 100 MG tablet TAKE 2 TABLETS BY MOUTH EVERY DAY 02/27/24  Yes Eubanks, Jessica K, NP  aspirin  81 MG tablet Take 1 tablet (81 mg total) by mouth daily. 01/14/15  Yes Arnetha Heft, MD  Calcium  Carb-Cholecalciferol  600-5 MG-MCG TABS Take 2 tablets by mouth daily. 09/07/21  Yes [provider]  Cholecalciferol  1000 UNITS tablet Take 1,000 Units by mouth daily.   Yes [provider]  darolutamide (NUBEQA) 300 MG tablet Take 300 mg by mouth in the morning and at bedtime. 06/12/24 06/12/25 Yes [provider]  doxazosin  (CARDURA ) 4 MG tablet TAKE 1  TABLET BY MOUTH EVERY DAY 04/26/24  Yes [name], NP  leuprolide , 6 Month, (ELIGARD ) 45 MG injection Inject 45 mg into the skin every 6 (six) months. Filled by [facility] 08/20/15  Yes Franchot Hough, DO  lisinopril -hydrochlorothiazide  (ZESTORETIC ) 20-12.5 MG tablet TAKE 2 TABLETS BY MOUTH EVERY MORNING 02/27/24  Yes Eubanks, Jessica K, NP  metoprolol  tartrate (LOPRESSOR ) 50 MG tablet TAKE 1 TABLET BY MOUTH TWICE A DAY 04/26/24  Yes Eubanks, Jessica K, NP  Omega-3 Fatty Acids (FISH OIL ) 1000 MG CAPS Take 1 capsule by mouth daily.   Yes [provider]  pyridoxine (B-6) 100 MG tablet Take 100 mg by mouth daily.   Yes [provider]  rosuvastatin  (CRESTOR ) 5 MG tablet Take 5 mg by mouth daily. 06/12/24 06/12/25 Yes [provider]  glucose blood (ONETOUCH VERIO) test strip Check blood sugar one to two times a week. Dx:E11.29 10/21/23   [name], NP    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/28/2006   Bone metastases    left mid tibial shaft   Cataract    Dr.Groat   Cellulitis and abscess of leg, except foot 01/17/2012   Chest pain, unspecified 04/28/2004   Closed fracture of five ribs 03/24/2004   First degree atrioventricular block 04/25/2007   Gross hematuria 09/06/2011   History of radiation therapy 04/03/14- 04/17/14   mid to distal left tibia 3000 cGy in 10 sessions   HTN (hypertension)    Hx  of radiation therapy 11/1998   prostate fossa - 6040 cGy, 33 fx, Dr Johney   Osteoarthrosis involving, or with mention of more than one site, but not specified as generalized, multiple sites 10/22/2010   Other abnormal blood chemistry 04/29/1991   Other and unspecified hyperlipidemia 01/02/2013   Palpitations 04/28/2004   Prostate cancer (HCC) 12/16/1994   gleason 7   Reflux esophagitis 05/10/2008   Routine general medical examination at a health care facility    Spinal stenosis, unspecified region  other than cervical 04/29/1995   Type II or unspecified type diabetes mellitus without mention of complication, uncontrolled     Past Surgical History:  Procedure Laterality Date   AIR/FLUID EXCHANGE Left 01/09/2016   Procedure: AIR/FLUID EXCHANGE;  Surgeon: Onesimo Blanch, MD;  Location: [facility] OR;  Service: Ophthalmology;  Laterality: Left;   CYSTOSCOPY W/ URETERAL STENT PLACEMENT  12/31/14   CYSTOSCOPY W/ URETERAL STENT PLACEMENT  05/16/2015   EYE SURGERY  2006   cataract, Dr Loras   LASER PHOTO ABLATION Left 01/09/2016   Procedure: LASER PHOTO ABLATION;  Surgeon: Onesimo Blanch, MD;  Location: [facility] OR;  Service: Ophthalmology;  Laterality: Left;  Endolaser   Left ureteral stent placement  Week of 12/05/11   PARS PLANA VITRECTOMY Left 01/09/2016   Procedure: PARS PLANA VITRECTOMY WITH 25 GAUGE LEFT EYE ;  Surgeon: Onesimo Blanch, MD;  Location[facility] OR;  Service: Ophthalmology;  Laterality: Left;   PERFLUORONE INJECTION Left 01/09/2016   Procedure: PERFLUORONE INJECTION;  Surgeon: Onesimo Blanch, MD;  Location: [facility] OR;  Service: Ophthalmology;  Laterality: Left;   PROSTATECTOMY  1996   [name]   URETERAL STENT PLACEMENT  05/2020   [facility] every 3 months    URETERAL STENT PLACEMENT  09/23/2020   Patient gets replaced every 3 months      reports that he quit smoking about 55 years ago. His smoking use included cigars. He has never used smokeless tobacco. He reports that he does not drink alcohol and does not use drugs.  Family History  Problem Relation Age of Onset   Heart disease Father    Stroke Sister      Physical Exam: Vitals:   09/06/24 0500 09/06/24 0502 09/06/24 0545 09/06/24 0600  BP: (!) 140/63 (!) 131/59  (!) 110/48  Pulse: (!) 59 (!) 57 (!) 55 (!) 56  Resp: (!) 24 17 17 18   Temp:  98 F (36.7 C)    TempSrc:  Oral    SpO2: 100% 100% 100% 100%  Weight:      Height:        Gen: Awake, alert, chronically ill appearing   CV: Regular, normal S1, S2, 2/6 SEM  Resp:  Normal WOB, CTAB  Abd: round, normoactive, nontender MSK: Symmetric, 1+ edema  Skin: No rashes or lesions to exposed skin  Neuro: Alert and interactive  Psych: euthymic, appropriate    Data review:   Labs reviewed, notable for:   Bicarb 18, AG 10  Cr 2.14 (recent b/l 1.8 - 2)  Hb 6.5, mcv 91  PLT 145  FOBT +    Micro:  Results for orders placed or performed in visit on 12/14/22  Urine Culture     Status: Abnormal   Collection Time: 12/15/22 10:01 AM   Specimen: Urine  Result Value Ref Range Status   MICRO NUMBER: [id number]  Final   SPECIMEN QUALITY: Adequate  Final   Sample Source URINE  Final   STATUS: FINAL  Final  ISOLATE 1: Klebsiella pneumoniae (A)  Final    Comment: Greater than 100,000 CFU/mL of Klebsiella pneumoniae      Susceptibility   Klebsiella pneumoniae - URINE CULTURE, REFLEX    AMOX/CLAVULANIC <=2 Sensitive     AMPICILLIN 16 Resistant     AMPICILLIN/SULBACTAM <=2 Sensitive     CEFAZOLIN * <=4 Not Reportable      * For infections other than uncomplicated UTI caused by E. coli, K. pneumoniae or P. mirabilis: Cefazolin  is resistant if MIC > or = 8 mcg/mL. (Distinguishing susceptible versus intermediate for isolates with MIC < or = 4 mcg/mL requires additional testing.) For uncomplicated UTI caused by E. coli, K. pneumoniae or P. mirabilis: Cefazolin  is susceptible if MIC <32 mcg/mL and predicts susceptible to the oral agents cefaclor, cefdinir, cefpodoxime, cefprozil, cefuroxime, cephalexin and loracarbef.     CEFTAZIDIME <=1 Sensitive     CEFEPIME <=1 Sensitive     CEFTRIAXONE  <=1 Sensitive     CIPROFLOXACIN  <=0.25 Sensitive     LEVOFLOXACIN <=0.12 Sensitive     GENTAMICIN <=1 Sensitive     IMIPENEM <=0.25 Sensitive     NITROFURANTOIN 128 Resistant     PIP/TAZO <=4 Sensitive     TOBRAMYCIN  <=1 Sensitive     TRIMETH/SULFA* <=20 Sensitive      * For infections other than uncomplicated UTI caused by E. coli, K. pneumoniae or P.  mirabilis: Cefazolin  is resistant if MIC > or = 8 mcg/mL. (Distinguishing susceptible versus intermediate for isolates with MIC < or = 4 mcg/mL requires additional testing.) For uncomplicated UTI caused by E. coli, K. pneumoniae or P. mirabilis: Cefazolin  is susceptible if MIC <32 mcg/mL and predicts susceptible to the oral agents cefaclor, cefdinir, cefpodoxime, cefprozil, cefuroxime, cephalexin and loracarbef. Legend: S = Susceptible  I = Intermediate R = Resistant  NS = Not susceptible * = Not tested  NR = Not reported **NN = See antimicrobic comments     Imaging reviewed:  CT ABDOMEN PELVIS WO CONTRAST Result Date: 09/06/2024 EXAM: CT ABDOMEN AND PELVIS WITHOUT CONTRAST 09/06/2024 04:40:24 AM TECHNIQUE: CT of the abdomen and pelvis was performed without the administration of intravenous contrast. Multiplanar reformatted images are provided for review. Automated exposure control, iterative reconstruction, and/or weight-based adjustment of the mA/kV was utilized to reduce the radiation dose to as low as reasonably achievable. COMPARISON: CT abdomen and pelvis 12/10/2012. CLINICAL HISTORY: 89 year old male. Acute nonlocalized abdominal pain, low HGB (hemoglobin), and reported blood in stool. FINDINGS: LOWER CHEST: Chronic cardiomegaly is stable. No pericardial effusion. Advanced calcified atherosclerosis including aortoiliac involvement. Normal caliber abdominal aorta. Lung base ventilation improved. Chronic lower rib fractures. Mild lung base atelectasis or scarring. LIVER: The liver is unremarkable. GALLBLADDER AND BILE DUCTS: Contracted gallbladder. No biliary ductal dilatation. SPLEEN: No acute abnormality. PANCREAS: Pancreatic fatty atrophy. ADRENAL GLANDS: Chronic left adrenal gland myelolipoma, benign and stable since 2014 (no follow up imaging recommended). KIDNEYS, URETERS AND BLADDER: Left renal double J ureteral stent in place, similar to the appearance in 2014. Left renal cortical  atrophy since that time. No acute hydronephrosis or hydroureter. Contralateral right renal volume is better maintained although also mildly decreased. Double J stent is appropriately positioned. Urinary bladder is diminutive. No stones in the kidneys or ureters. No perinephric or periureteral stranding. GI AND BOWEL: Stomach demonstrates no acute abnormality. Decompressed stomach and duodenum. Moderate sigmoid diverticulosis, no active inflammation. Relatively mild retained stool in the large bowel. Normal appendix on series 3 image 56. Indistinct abnormal soft tissue  thickening and inflammation at the ileocecal valve on series 3 image 43. Surrounding patchy mesenteric stranding. This is a short segment abnormality, encompassing about 3 cm of both the distal terminal ileum and cecum. There is a nodular soft tissue configuration (coronal image 79). Small regional mesenteric lymph nodes in addition to the mild mesenteric stranding (coronal image 71). Acute mass versus inflammation at the junction of the terminal ileum and cecum (IC valve) on this noncontrast exam. Mild to moderate regional mesenteric inflammation. Upstream small bowel loops are nondilated and appear normal. There is no bowel obstruction. No pneumoperitoneum or free fluid. PERITONEUM AND RETROPERITONEUM: No ascites. No free air. VASCULATURE: Aorta is normal in caliber. Advanced calcified atherosclerosis including aortoiliac involvement. LYMPH NODES: Small regional mesenteric lymph nodes in addition to the mild mesenteric stranding (coronal image 71). REPRODUCTIVE ORGANS: Chronic prostate therapy and or surgical clips stable. No pelvis recluse. BONES AND SOFT TISSUES: Diffuse osteopenia. Hyperostosis in the spine with intermittent levels of spinal ankylosis. Chronic lower rib fractures. Small chronic fat containing umbilical hernia. No herniated bowel. No focal soft tissue abnormality. IMPRESSION: 1. Acute Mass versus Inflammation at the junction of the  terminal ileum and cecum (IC valve, series 3 image 53) on this noncontrast exam. Mild regional mesenteric inflammation. No regional lymphadenopathy. Colonoscopy may be necessary to exclude Tumor. 2. No other acute finding. Chronic left renal atrophy with indwelling left double-J stent. Moderate sigmoid diverticulosis without evidence of diverticulitis. Advanced aortic calcified atherosclerosis. Electronically signed by: Helayne Hurst MD 09/06/2024 05:02 AM EDT RP Workstation: [id number]    EKG:  Personally reviewed, SR with 1st deg AVB, PRWP, no acute ischemic changes    ED Course:  Ordered for 1 U RBC. EDP messaged to [facility], group to see in AM    Assessment/Plan:  89 y.o. male with hx metastatic prostate cancer, hx IDA, low flow-low gradient AS, HFpEF, aortic atherosclerosis, CKD3b, DM2, HTN, HLD, hx hydronephrosis requiring ureteral stent, neuropathy, gout, who was referred to ED after labs at cardiology appt with severe anemia, found to have inflammation v mass in the terminal ileum and likely Lower GI bleeding contributing to underlying anemia   Severe anemia, acute on chronic  Component of blood loss with LGIB likely  Inflammation v mass of terminal ileum  Hx chronic IDA, baseline Hb around 8-9; although lower when last checked in 10/'25, followed by heme/onc as outpatient and receives IV Iron. On OP labs was 6.6, similar on repeat lab in ED. Reports chronic dark red blood in stool x 1 year. On aspirin  for secondary prevention ASCVD. Has never had colonoscopy by his report. He has had OP PET imaging for hx of prostate cancer, last in 5/'25 and no abnormal uptake in TI noted. CT A/P with inflammation v mass in the TI and cecum, associated mild mesenteric stranding.  -- EDP consulted [facility], group to see in AM. Anticipate may need colonoscopy for tissue sampling in TI  -- Transfused 1 U RBC in the ED, for now transfuse Hb < 7, could consider < 8 with hx of AS, and possible chronic  anginal symptom  -- Repeat Hb in PM, tentatively ordered for 1400, trend CBC  -- Clear liquid diet for now  -- Check iron panel, B12, folate  -- He was due for Iron infusion tomorrow, advised to cancel appt and we will check Iron and give iron infusion as needed.   Chronic medical problems:  Metastatic prostate cancer: Followed by [facility], on Darolutamide may continue  as nonforumlary if brings, not ordered for now  Low flow-low gradient AS: Follows with cardiology as OP is being evaluated for TAVR.  HFpEF: Without acute exacerbation Aortic atherosclerosis: on aspirin , held in setting of LGIB   CKD3b: Baseline Cr ~ 1.8 - 2, is 2.14 on admission, follow.  DM2: Diet controlled, can add SSI if runs high  HTN: Hold his Lisinopril -hydrochlorothiazide , and Doxazosin  with low normal BP. He is mildly bradycardic, reduce metoprolol  to 37.5 mg BID (home is 50 mg BID).  HLD: Continue home rosuvastatin   hx hydronephrosis requiring ureteral stent: Undergoes stent exchange q3 months  Neuropathy: Noted, not on medication  Gout: Continue home Allopurinol    Body mass index is 37.02 kg/m. Obesity class II    DVT prophylaxis:  SCDs Code Status:  Full Code Diet:  Diet Orders (From admission, onward)     Start     Ordered   09/06/24 0159  Diet NPO time specified  Diet effective now        09/06/24 0158           Family Communication:  Yes discussed with family at bedside   Consults:  GI [facility]   Admission status:   Inpatient, Telemetry bed  Severity of Illness: The appropriate patient status for this patient is INPATIENT. Inpatient status is judged to be reasonable and necessary in order to provide the required intensity of service to ensure the patient's safety. The patient's presenting symptoms, physical exam findings, and initial radiographic and laboratory data in the context of their chronic comorbidities is felt to place them at high risk for further clinical deterioration. Furthermore,  it is not anticipated that the patient will be medically stable for discharge from the hospital within 2 midnights of admission.   * I certify that at the point of admission it is my clinical judgment that the patient will require inpatient hospital care spanning beyond 2 midnights from the point of admission due to high intensity of service, high risk for further deterioration and high frequency of surveillance required.*   Dorn Dawson, MD [facility]  How to contact the [facility] Attending or Consulting provider 7A - 7P or covering provider during after hours 7P -7A, for this patient.  Check the care team in [facility] and look for a) attending/consulting [facility] provider listed and b) the [facility] team listed Log into [email or web address] and use 's universal password to access. If you do not have the password, please contact the hospital operator. Locate the [facility] provider you are looking for under [facility] and page to a number that you can be directly reached. If you still have difficulty reaching the provider, please page the [facility] (Director on Call) for the Hospitalists listed on amion for assistance.  09/06/2024, 6:27 AM

## 2024-09-06 NOTE — Consult Note (Addendum)
 Cardiology Consultation   Patient ID: Jeremiah Collins MRN: [id number]; DOB: June 08, 1934  Admit date: 09/06/2024 Date of Consult: 09/06/2024  PCP:  [name], NP   [facility] Providers Cardiologist:  Oneil Parchment, MD        Patient Profile: Jeremiah Collins is a 89 y.o. male with a hx of hypertension, hyperlipidemia, type 2 diabetes, CKD stage IIIb, obesity, iron deficiency anemia, aortic atherosclerosis, metastatic prostate cancer that was initially diagnosed in 1996, chronic hydronephrosis requiring stent exchanges, and moderate to severe aortic valve stenosis who is being seen 09/06/2024 for preop evaluation at the request of Royal Sill, MD.  History of Present Illness: Jeremiah Collins is a 89 year old male who has prior cardiac history shown below.  Echo on 08/23/2024 showed an LVEF of 55 to 60%, mild concentric LVH, normal RV systolic function, moderately dilated left atrium, mild MR, severe MAC, mild to moderate TR, severe aortic valve calcification with severe low-flow low gradient AS PV 2.6 m/sec, MG 15 mmHg, and DVI of 0.25  Patient was seen by Dr. Wendel yesterday for his aortic stenosis.  The patient was felt to be in NYHA class II for his shortness of breath.  He reviewed the patient's echo that was done on 08/23/2024 and felt that the patient had low-flow low gradient aortic stenosis.  The plan was to schedule the patient for a left and right cardiac catheterization, a TAVR CTA, and an appointment with CT surgery.  Dr. Wendel felt like the patient was at a relatively low risk to undergo stent exchanges for his hydronephrosis under general anesthesia. A complete blood count and basic metabolic panel were ordered.  The CBC showed the patient had anemia with a hemoglobin of 6.6.  Because of this the patient was sent to the emergency department.  In the emergency department the patient received 1 unit of blood.  CT abdomen and pelvis appears to show coronary  calcifications, advanced aortic atherosclerosis including aortoiliac involvement, acute mass versus inflammation at the terminal ileum and cecum.  GI saw the patient and is planning to do a colonoscopy.  Cardiology was consulted to do a preoperative risk assessment.  On interview patient reported that he can only walk through part of his house before getting short of breath.  Unable to walk around larger grocery stores.  Unable to do more than 4 metabolic equivalents of exertion.  At rest denies any shortness of breath.  Denies any chest pain lower extremity edema, or orthopnea.  Denies any alcohol use, tobacco use, cannabis use, illicit substance use.  Reported melena and hematochezia over the past year.   Labs showed potassium of 4.1, decreased bicarb of 18, increased creatinine of 2.14, hypocalcemia of 8.6, hypoalbuminemia of 2.8, WBC count of 7.3,and anemia with a hemoglobin of 6.5.  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/28/2006   Bone metastases    left mid tibial shaft   Cataract    Dr.Groat   Cellulitis and abscess of leg, except foot 01/17/2012   Chest pain, unspecified 04/28/2004   Closed fracture of five ribs 03/24/2004   First degree atrioventricular block 04/25/2007   Gross hematuria 09/06/2011   History of radiation therapy 04/03/14- 04/17/14   mid to distal left tibia 3000 cGy in 10 sessions   HTN (hypertension)    Hx of radiation therapy 11/1998   prostate fossa - 6040 cGy, 33 fx, Dr Johney   Osteoarthrosis involving, or with mention  of more than one site, but not specified as generalized, multiple sites 10/22/2010   Other abnormal blood chemistry 04/29/1991   Other and unspecified hyperlipidemia 01/02/2013   Palpitations 04/28/2004   Prostate cancer (HCC) 12/16/1994   gleason 7   Reflux esophagitis 05/10/2008   Routine general medical examination at a health care facility    Spinal stenosis, unspecified region other  than cervical 04/29/1995   Type II or unspecified type diabetes mellitus without mention of complication, uncontrolled     Past Surgical History:  Procedure Laterality Date   AIR/FLUID EXCHANGE Left 01/09/2016   Procedure: AIR/FLUID EXCHANGE;  Surgeon: Onesimo Blanch, MD;  Location: [facility] OR;  Service: Ophthalmology;  Laterality: Left;   CYSTOSCOPY W/ URETERAL STENT PLACEMENT  12/31/14   CYSTOSCOPY W/ URETERAL STENT PLACEMENT  05/16/2015   EYE SURGERY  2006   cataract, Dr Loras   LASER PHOTO ABLATION Left 01/09/2016   Procedure: LASER PHOTO ABLATION;  Surgeon: Onesimo Blanch, MD;  Location: [facility] OR;  Service: Ophthalmology;  Laterality: Left;  Endolaser   Left ureteral stent placement  Week of 12/05/11   PARS PLANA VITRECTOMY Left 01/09/2016   Procedure: PARS PLANA VITRECTOMY WITH 25 GAUGE LEFT EYE ;  Surgeon: Onesimo Blanch, MD;  Location: [facility] OR;  Service: Ophthalmology;  Laterality: Left;   PERFLUORONE INJECTION Left 01/09/2016   Procedure: PERFLUORONE INJECTION;  Surgeon: Onesimo Blanch, MD;  Location: [facility] OR;  Service: Ophthalmology;  Laterality: Left;   PROSTATECTOMY  1996   [name]   URETERAL STENT PLACEMENT  05/2020   [facility] every 3 months    URETERAL STENT PLACEMENT  09/23/2020   Patient gets replaced every 3 months      Home Medications:  Prior to Admission medications   Medication Sig Start Date End Date Taking? Authorizing Provider  allopurinol  (ZYLOPRIM ) 100 MG tablet TAKE 2 TABLETS BY MOUTH EVERY DAY 02/27/24  Yes Eubanks, Jessica K, NP  aspirin  81 MG tablet Take 1 tablet (81 mg total) by mouth daily. 01/14/15  Yes Arnetha Heft, MD  Calcium  Carb-Cholecalciferol  600-5 MG-MCG TABS Take 2 tablets by mouth daily. 09/07/21  Yes [provider]  Cholecalciferol  1000 UNITS tablet Take 1,000 Units by mouth daily.   Yes [provider]  darolutamide (NUBEQA) 300 MG tablet Take 300 mg by mouth in the morning and at bedtime. 06/12/24 06/12/25 Yes [provider]   doxazosin  (CARDURA ) 4 MG tablet TAKE 1 TABLET BY MOUTH EVERY DAY 04/26/24  Yes Eubanks, Jessica K, NP  leuprolide , 6 Month, (ELIGARD ) 45 MG injection Inject 45 mg into the skin every 6 (six) months. Filled by [facility] 08/20/15  Yes Franchot Hough, DO  lisinopril -hydrochlorothiazide  (ZESTORETIC ) 20-12.5 MG tablet TAKE 2 TABLETS BY MOUTH EVERY MORNING 02/27/24  Yes Eubanks, Jessica K, NP  metoprolol  tartrate (LOPRESSOR ) 50 MG tablet TAKE 1 TABLET BY MOUTH TWICE A DAY 04/26/24  Yes Eubanks, Jessica K, NP  Omega-3 Fatty Acids (FISH OIL ) 1000 MG CAPS Take 1 capsule by mouth daily.   Yes [provider]  pyridoxine (B-6) 100 MG tablet Take 100 mg by mouth daily.   Yes [provider]  rosuvastatin  (CRESTOR ) 5 MG tablet Take 5 mg by mouth daily. 06/12/24 06/12/25 Yes [provider]  glucose blood (ONETOUCH VERIO) test strip Check blood sugar one to two times a week. Dx:E11.29 10/21/23   Eubanks, Jessica K, NP    Scheduled Meds:  sodium chloride    Intravenous Once   allopurinol   200 mg Oral  Daily   doxazosin   4 mg Oral Daily   furosemide   20 mg Intravenous Once   metoprolol  tartrate  25 mg Oral BID   Na Sulfate-K Sulfate-Mg Sulfate concentrate  0.5 kit Oral Once   Followed by   Na Sulfate-K Sulfate-Mg Sulfate concentrate  0.5 kit Oral Once   rosuvastatin   5 mg Oral Daily   simethicone  240 mg Oral Once   Followed by   simethicone  240 mg Oral Once   sodium chloride  flush  3 mL Intravenous Q12H   Continuous Infusions:  sodium chloride  Stopped (09/06/24 1124)   PRN Meds: acetaminophen , albuterol , melatonin, ondansetron  (ZOFRAN ) IV, polyethylene glycol  Allergies:    Allergies  Allergen Reactions   Abiraterone Nausea And Vomiting and Other (See Comments)    Other reaction(s): Increased Heart Rate (intolerance)     Social History:   Social History   Socioeconomic History   Marital status: Married    Spouse name: Not on file   Number of children: Not on  file   Years of education: Not on file   Highest education level: Not on file  Occupational History   Not on file  Tobacco Use   Smoking status: Former    Types: Cigars    Quit date: 12/16/1968    Years since quitting: 55.7   Smokeless tobacco: Never  Vaping Use   Vaping status: Never Used  Substance and Sexual Activity   Alcohol use: No    Alcohol/week: 0.0 standard drinks of alcohol   Drug use: No   Sexual activity: Never  Other Topics Concern   Not on file  Social History Narrative   Not on file   Social Drivers of Health   Financial Resource Strain: Low Risk  (09/15/2018)   Overall Financial Resource Strain (CARDIA)    Difficulty of Paying Living Expenses: Not hard at all  Food Insecurity: No Food Insecurity (09/15/2018)   Hunger Vital Sign    Worried About Running Out of Food in the Last Year: Never true    Ran Out of Food in the Last Year: Never true  Transportation Needs: No Transportation Needs (09/15/2018)   PRAPARE - Administrator, Civil Service (Medical): No    Lack of Transportation (Non-Medical): No  Physical Activity: Inactive (09/15/2018)   Exercise Vital Sign    Days of Exercise per Week: 0 days    Minutes of Exercise per Session: 0 min  Stress: No Stress Concern Present (09/15/2018)   [employer] of Occupational Health - Occupational Stress Questionnaire    Feeling of Stress : Not at all  Social Connections: Somewhat Isolated (09/15/2018)   Social Connection and Isolation Panel    Frequency of Communication with Friends and Family: Three times a week    Frequency of Social Gatherings with Friends and Family: Once a week    Attends Religious Services: Never    Database administrator or Organizations: No    Attends Banker Meetings: Never    Marital Status: Married  Catering manager Violence: Not At Risk (09/15/2018)   Humiliation, Afraid, Rape, and Kick questionnaire    Fear of Current or Ex-Partner: No    Emotionally Abused:  No    Physically Abused: No    Sexually Abused: No    Family History:    Family History  Problem Relation Age of Onset   Heart disease Father    Stroke Sister      ROS:  Please  see the history of present illness.   All other ROS reviewed and negative.     Physical Exam/Data: Vitals:   09/06/24 0939 09/06/24 1100 09/06/24 1128 09/06/24 1129  BP:  118/69 (!) 115/51 (!) 115/51  Pulse: 65 (!) 55 (!) 53 (!) 53  Resp:  18 18 18   Temp:  98.3 F (36.8 C)  97.9 F (36.6 C)  TempSrc:  Oral  Oral  SpO2:  100% 100% 100%  Weight:      Height:       No intake or output data in the 24 hours ending 09/06/24 1145    09/06/2024    1:54 AM 09/05/2024    9:02 AM 07/26/2024    8:46 AM  Last 3 Weights  Weight (lbs) 265 lb 6.4 oz 265 lb 6.4 oz 265 lb 3.2 oz  Weight (kg) 120.385 kg 120.385 kg 120.294 kg     Body mass index is 37.02 kg/m.  General:  Well nourished, well developed, in no acute distress.  Lying comfortably on bed HEENT: normal Neck: no JVD Vascular: No carotid bruits; Distal pulses 2+ bilaterally Cardiac:  normal S1, S2; RRR; 2 out of 6 systolic murmur heard best on upper sternal border. Lungs:  clear to auscultation bilaterally, no wheezing, rhonchi or rales  Abd: soft, nontender, no hepatomegaly  Ext: no edema Musculoskeletal:  No deformities. Skin: warm and dry  Neuro:  no focal abnormalities noted Psych:  Normal affect   EKG:  The EKG was personally reviewed and demonstrates: Normal sinus rhythm with a rate of 60 and a first-degree AV block. Telemetry:  Telemetry was personally reviewed and demonstrates: Normal sinus rhythm with heart rates in the 50s to 60s.  Relevant CV Studies: Prior echo on 08/27/2024 IMPRESSIONS     1. Left ventricular ejection fraction, by estimation, is 55 to 60%. Left  ventricular ejection fraction by 3D volume is 63 %. The left ventricle has  normal function. The left ventricle has no regional wall motion  abnormalities. There is  mild concentric  left ventricular hypertrophy. The average left ventricular global  longitudinal strain is -23.5 %. The global longitudinal strain is normal.   2. RVSP estimated at 39 mmHg about RAP.SABRA Right ventricular systolic  function is normal. The right ventricular size is normal.   3. Left atrial size was moderately dilated.   4. The mitral valve is normal in structure. Mild mitral valve  regurgitation. No evidence of mitral stenosis. Severe mitral annular  calcification.   5. Tricuspid valve regurgitation is mild to moderate.   6. There is severe aortic calcification with associated moderate to  severe paradoxical low flow low gradient AS w/ PV 2.6 m/sec, MG 15 mmHg,  AVA 1.00 cm^2 (by continuity), iAVA 0.36 cm^2/m^2 and DVI 0.25. There is  also mild AI. SABRA The aortic valve is  normal in structure. There is severe calcifcation of the aortic valve.  Aortic valve regurgitation is mild. No aortic stenosis is present.   7. There is mild dilatation of the ascending aorta, measuring 41 mm.   8. The inferior vena cava is normal in size with greater than 50%  respiratory variability, suggesting right atrial pressure of 3 mmHg.   Conclusion(s)/Recommendation(s): Compared to prior echo, EF is stable. AS  gradient and peak velocity similar but AVA suggesting progression to  severe AS.   Laboratory Data: High Sensitivity Troponin:   Recent Labs  Lab 09/06/24 0351 09/06/24 0736  TROPONINIHS 13 11  Chemistry Recent Labs  Lab 09/05/24 1033 09/06/24 0203  NA 142 142  K 4.9 4.1  CL 109* 114*  CO2 19* 18*  GLUCOSE 104* 124*  BUN 50* 48*  CREATININE 2.02* 2.14*  CALCIUM  9.0 8.6*  GFRNONAA  --  29*  ANIONGAP  --  10    Recent Labs  Lab 09/06/24 0203  PROT 5.3*  ALBUMIN 2.8*  AST 17  ALT 13  ALKPHOS 48  BILITOT 0.5   Lipids No results for input(s): CHOL, TRIG, HDL, LABVLDL, LDLCALC, CHOLHDL in the last 168 hours.  Hematology Recent Labs  Lab 09/05/24 1033  09/06/24 0203  WBC 7.4 7.3  RBC 2.40* 2.32*  HGB 6.6* 6.5*  HCT 22.0* 21.2*  MCV 92 91.4  MCH 27.5 28.0  MCHC 30.0* 30.7  RDW 15.2 16.5*  PLT 157 145*   Thyroid  No results for input(s): TSH, FREET4 in the last 168 hours.  BNPNo results for input(s): BNP, PROBNP in the last 168 hours.  DDimer No results for input(s): DDIMER in the last 168 hours.  Radiology/Studies:  CT ABDOMEN PELVIS WO CONTRAST Result Date: 09/06/2024 EXAM: CT ABDOMEN AND PELVIS WITHOUT CONTRAST 09/06/2024 04:40:24 AM TECHNIQUE: CT of the abdomen and pelvis was performed without the administration of intravenous contrast. Multiplanar reformatted images are provided for review. Automated exposure control, iterative reconstruction, and/or weight-based adjustment of the mA/kV was utilized to reduce the radiation dose to as low as reasonably achievable. COMPARISON: CT abdomen and pelvis 12/10/2012. CLINICAL HISTORY: 89 year old male. Acute nonlocalized abdominal pain, low HGB (hemoglobin), and reported blood in stool. FINDINGS: LOWER CHEST: Chronic cardiomegaly is stable. No pericardial effusion. Advanced calcified atherosclerosis including aortoiliac involvement. Normal caliber abdominal aorta. Lung base ventilation improved. Chronic lower rib fractures. Mild lung base atelectasis or scarring. LIVER: The liver is unremarkable. GALLBLADDER AND BILE DUCTS: Contracted gallbladder. No biliary ductal dilatation. SPLEEN: No acute abnormality. PANCREAS: Pancreatic fatty atrophy. ADRENAL GLANDS: Chronic left adrenal gland myelolipoma, benign and stable since 2014 (no follow up imaging recommended). KIDNEYS, URETERS AND BLADDER: Left renal double J ureteral stent in place, similar to the appearance in 2014. Left renal cortical atrophy since that time. No acute hydronephrosis or hydroureter. Contralateral right renal volume is better maintained although also mildly decreased. Double J stent is appropriately positioned. Urinary  bladder is diminutive. No stones in the kidneys or ureters. No perinephric or periureteral stranding. GI AND BOWEL: Stomach demonstrates no acute abnormality. Decompressed stomach and duodenum. Moderate sigmoid diverticulosis, no active inflammation. Relatively mild retained stool in the large bowel. Normal appendix on series 3 image 56. Indistinct abnormal soft tissue thickening and inflammation at the ileocecal valve on series 3 image 43. Surrounding patchy mesenteric stranding. This is a short segment abnormality, encompassing about 3 cm of both the distal terminal ileum and cecum. There is a nodular soft tissue configuration (coronal image 79). Small regional mesenteric lymph nodes in addition to the mild mesenteric stranding (coronal image 71). Acute mass versus inflammation at the junction of the terminal ileum and cecum (IC valve) on this noncontrast exam. Mild to moderate regional mesenteric inflammation. Upstream small bowel loops are nondilated and appear normal. There is no bowel obstruction. No pneumoperitoneum or free fluid. PERITONEUM AND RETROPERITONEUM: No ascites. No free air. VASCULATURE: Aorta is normal in caliber. Advanced calcified atherosclerosis including aortoiliac involvement. LYMPH NODES: Small regional mesenteric lymph nodes in addition to the mild mesenteric stranding (coronal image 71). REPRODUCTIVE ORGANS: Chronic prostate therapy and or surgical clips stable. No  pelvis recluse. BONES AND SOFT TISSUES: Diffuse osteopenia. Hyperostosis in the spine with intermittent levels of spinal ankylosis. Chronic lower rib fractures. Small chronic fat containing umbilical hernia. No herniated bowel. No focal soft tissue abnormality. IMPRESSION: 1. Acute Mass versus Inflammation at the junction of the terminal ileum and cecum (IC valve, series 3 image 53) on this noncontrast exam. Mild regional mesenteric inflammation. No regional lymphadenopathy. Colonoscopy may be necessary to exclude Tumor. 2. No  other acute finding. Chronic left renal atrophy with indwelling left double-J stent. Moderate sigmoid diverticulosis without evidence of diverticulitis. Advanced aortic calcified atherosclerosis. Electronically signed by: Helayne Hurst MD 09/06/2024 05:02 AM EDT RP Workstation: [id number]     Assessment and Plan:  Jeremiah Collins is a 89 y.o. male with a hx of hypertension, hyperlipidemia, type 2 diabetes, CKD stage IIIb, obesity, iron deficiency anemia, aortic atherosclerosis, metastatic prostate cancer that was initially diagnosed in 1996, chronic hydronephrosis requiring stent exchanges, and moderate to severe aortic valve stenosis who is being seen 09/06/2024 for preop evaluation at the request of Royal Sill, MD.  Aortic valve stenosis chronic hydronephrosis requiring stent exchanges Patient was seen by Dr. Wendel yesterday for his aortic stenosis.  The patient was felt to be in NYHA class II for his shortness of breath.  He reviewed the patient's echo that was done on 08/23/2024 and felt that the patient had low-flow low gradient aortic stenosis.  The plan was to schedule the patient for a left and right cardiac catheterization, a TAVR CTA, and an appointment with CT surgery.  Dr. Wendel felt like the patient was at a relatively low risk to undergo stent exchanges for his hydronephrosis under general anesthesia. A complete blood count and basic metabolic panel were ordered.  The CBC showed the patient had anemia with a hemoglobin of 6.6.  Because of this the patient was sent to the emergency department. Will cancel left and right heart cath on 09/12/24. Will need rescheduled after acute concerns resolved.   Anemia Abdominal mass versus inflammation Discovered as mentioned under aortic valve stenosis above.  On interview patient reported that he has had melena and hematochezia for a year and denies chest pain. The patient received 1 unit of blood.  Occult was positive. CT abdomen and  pelvis showed advanced aortic atherosclerosis including aortoiliac involvement, acute mass versus inflammation at the terminal ileum and cecum.  GI saw the patient and is planning to do a colonoscopy.  Cardiology was consulted to do a preoperative risk assessment. - EKG showed no acute ischemic changes. - High-sensitivity troponins negative at 13. Has a RCRI score of 1. This is a 1.1 % risk of a major cardiac event. The patient is unable to do more than 4 metabolic equivalents of exertion. This increases cardiac risk. Suspect this is multifactorial from aortic stenosis and anemia. This patient has an elevated cardiac risk as is unable to do more than 4 metabolic equivalents of exertion. The patient was previously cleared by Dr. Wendel to undergo urinary stent exchange. Colonoscopy is necessary to workup possible abdominal mass and etiology of severe anemia.  Will discuss with Dr. Lavona but suspect patient may proceed with colonoscopy.   CKD stage 3B Creatinine elevated at 2.14.  Baseline appears to be about 1.6   Hypertension Blood pressure is well-controlled most recent BP 115/51. Continue to hold lisinopril /hydrochlorothiazide  due to worsening renal function Continue metoprolol  succinate 25 mg daily  Risk Assessment/Risk Scores:     New [place]  Heart Association (NYHA) Functional Class  NYHA Class II    [facility] will sign off.     For questions or updates, please contact [facility] Please consult [email or web address] for contact info under   Signed, Morse Clause, PA-C  09/06/2024 11:45 AM  History and all data above reviewed.  I personally took the history today, performed the physical exam and formulated the substantive portion of the assessment and plan.  I reviewed all relevant tests and studies.  The patient is presenting with acute on chronic anemia.  He reports some blood in his stools and has an abnormal area on his terminal ileum on CT.  He has aortic  stenosis and is being considered for TAVR.  He does have dyspnea and this has been chronic.  He will get SOB at 30 yards on level ground.  He does not have PND or orthopnea.  He rarely gets pain under his left breast but this has been an infrequent, chronic stable pattern.  The patient denies any new symptoms such as neck or arm discomfort. There have been no reported palpitations, presyncope or syncope.  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       Patient examined.  I agree with the findings as above.  The patient exam reveals COR:RRR, 2/6 apical systolic murmur, no diastolic murmurs  ,  Lungs: Clear  ,  Abd: Positive bowel sounds, no rebound no guarding, Ext   Moderate bilateral lower extremity edema  .    All available labs, radiology testing, previous records reviewed. Agree with documented assessment and plan. Preop:  The patient is at acceptable risk for the planned colonoscopy.  For the time being we will hold off on the pre TAVR evaluation.  GI bleeding needs to be understood and managed primarily.    I will ask the primary team to talk to the     [facility]  2:50 PM  09/06/2024

## 2024-09-06 NOTE — ED Notes (Signed)
 Per Greig Corti, PA, wait for H&H sent at 1500 to result. If hgb 7.5 or greater, hold 3rd unit PRBC.

## 2024-09-06 NOTE — Progress Notes (Signed)
 [facility]   ROUNDING   NOTE Jeremiah Collins FMW:[id number]  DOB: [birth year]  DOA: 09/06/2024  PCP: [name], NP  09/06/2024,8:07 AM  LOS: 0 days    Code Status:  Full code     From: Home   89 year old white male Known openly treated tibial fracture in the remote past 2015- DM TY 2 CKD 3B Metastatic prostate cancer diagnosed in 1996 with chronic left hydronephrosis requiring stent exchanges-on Eligard  and Nubeqa followed by [name] at [facility] [phone] Iron deficiency anemia Low-flow low gradient aortic stenosis with V-max 2.6 EF 55 to 60% ---confounding factors of low blood pressures  Seen in cardiology office 10/22 with NYHA class II symptoms and routine labs were done-patient was told to go to the ED with a hemoglobin of 6.6 and BRB per rectum  Chronology  10/23-admitted-GI consulted    Pertinent imaging/studies till date  CT ABD pelvis acute mass versus inflammation junction terminal ileum and cecum regional lymphadenopathy necessary but colonoscopy may be needed to exclude tumor No acute finding otherwise chronic left renal atrophy with indwelling left double-J stent moderate sigmoid diverticulosis without diverticulitis   Assessment  & Plan :    GI bleed undefined?  Mass--bleeding been going on for 6 months-went to PCP but apparently not sure if he gave stool cards to them Etiology undefined never colonoscopy before Planning for colonoscopy here in hospital and defer to GI planning/timing-likely a.m. HFpEF-low-flow aortic stenosis His aortic stenosis is not severe-I will ask Dr. WENDEL TO Formally put that in the note---see his notes from earlier this week He will need another unit of blood which has been ordered I would split the unit in an effort not to overload him Continue his metoprolol  37.5 twice daily I have held his aspirin  from admission He was supposed to have an appointment with cardiology who will be CCed on this note Metastatic prostate cancer  followed by [name] [phone] Follows at [facility] can resume his meds after all of his procedures--- he can resume them at his request as he was asking about them Chronic hydronephrosis requiring stenting Defer to urology Resume doxazosin  AKI superimposed upon CKD 3B Baseline creatinine around 1.4-1.6 here currently it is 2-2.1 and GFR has dropped Blood will help with management Holding HCTZ lisinopril  for now Hopeful for recovery HTN BP elevated do not aggressively control in the setting of current bleeding  Gout Quiescent  Data Reviewed today:  Sodium 142 potassium 4.1 BUN/creatinine 48/2.1 LFTs within normal range Hemoglobin 6.5 platelet 145 WBCs 7.3   DVT prophylaxis: SCD  Status is: Inpatient Remains inpatient appropriate because:   Requires transfusions colonoscopy and discussion of the same    Dispo/Global plan: See above   Time 60   Subjective:   Awake 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 and feels and is quite strong able to do most things Relates about a 52-month history of intermittent dark stool did not think anything of it not sure if he told anyone about it he has never had a colonoscopy No vomiting of blood No weight loss no change in consistency of stool other than some bleeding    Objective + exam Vitals:   09/06/24 0730 09/06/24 0732 09/06/24 0732 09/06/24 0732  BP:      Pulse:      Resp:      Temp: 98 F (36.7 C)     TempSrc: Oral  SpO2:  100% 100% 100%  Weight:      Height:       Filed Weights   09/06/24 0154  Weight: 120.4 kg   Pleasant  Examination: Coherent white male slightly obese no distress EOMI NCAT no focal deficit murmur noted Abdomen soft no rebound ROM intact Trace edema Power 5/5  Scheduled Meds:  sodium chloride    Intravenous Once   acetaminophen   650 mg Oral Once   allopurinol   200 mg Oral Daily   bisacodyl  10 mg Oral Once   diphenhydrAMINE  25 mg Oral Once    furosemide   20 mg Intravenous Once   metoprolol  tartrate  37.5 mg Oral BID   Na Sulfate-K Sulfate-Mg Sulfate concentrate  0.5 kit Oral Once   Followed by   Na Sulfate-K Sulfate-Mg Sulfate concentrate  0.5 kit Oral Once   rosuvastatin   5 mg Oral Daily   simethicone  240 mg Oral Once   Followed by   simethicone  240 mg Oral Once   sodium chloride  flush  3 mL Intravenous Q12H   Continuous Infusions: acetaminophen , albuterol , melatonin, ondansetron  (ZOFRAN ) IV, polyethylene glycol  Jeremiah Bri Wakeman, MD  [facility]

## 2024-09-06 NOTE — H&P (View-Only) (Signed)
 Consultation  Referring Provider: ER MD/MC/GroceE PA-C Primary Care Physician:  [name], NP Primary Gastroenterologist: Sampson  Reason for Consultation: Profound anemia, intermittent rectal bleeding  HPI: Jeremiah Collins is a 89 y.o. male who was admitted through the emergency room last evening after outpatient labs done at cardiology visit yesterday showed hemoglobin of 6.5. Patient has history of metastatic prostate cancer, diabetes mellitus, dilated cardiomyopathy, chronic kidney disease and aortic valve disease as well as hypertension, and history of hydronephrosis status post left ureteral stent.  He also has previous history of iron deficiency anemia for which he had been undergoing iron infusions. Patient says that he has noticed some blood in his stool sporadically over the past year.  He also has noticed some change in his bowel habits with some looser or softer stool though not necessarily going more frequently.  He has also had abdominal discomfort postprandially which will bother him for a couple of hours after eating, then resolves and usually will recur after he eats again.  He does feel that this is more localized to his right lower abdomen.  Denies any nausea or vomiting, appetite has been okay he apparently did have some chest discomfort yesterday.  Patient is on baby aspirin  daily no other blood thinners. He has not had prior colonoscopy.  Workup in the ER last evening with labs shows WBC of 7.4/hemoglobin 6.6/hematocrit of 22/MCV 92 BUN 50/creatinine 2.02/potassium 4.9  LFTs today within normal limits   Patient anemia panel from 08/28/2024 showed serum iron of 29/transferrin 227/ferritin 132 TIBC 325/iron sat 9  Patient received 1 unit of packed RBCs last evening  Labs pending this a.m.  CT of the abdomen pelvis done without contrast last evening shows a mass versus inflammation at the junction of the terminal ileum and cecum measuring approximately  3 cm, there is surrounding patchy mesenteric stranding and small regional mesenteric lymph nodes, no upstream dilation.  There is chronic left renal atrophy with indwelling left double-J stent and moderate sigmoid diverticulosis.     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/28/2006   Bone metastases    left mid tibial shaft   Cataract    Dr.Groat   Cellulitis and abscess of leg, except foot 01/17/2012   Chest pain, unspecified 04/28/2004   Closed fracture of five ribs 03/24/2004   First degree atrioventricular block 04/25/2007   Gross hematuria 09/06/2011   History of radiation therapy 04/03/14- 04/17/14   mid to distal left tibia 3000 cGy in 10 sessions   HTN (hypertension)    Hx of radiation therapy 11/1998   prostate fossa - 6040 cGy, 33 fx, Dr Johney   Osteoarthrosis involving, or with mention of more than one site, but not specified as generalized, multiple sites 10/22/2010   Other abnormal blood chemistry 04/29/1991   Other and unspecified hyperlipidemia 01/02/2013   Palpitations 04/28/2004   Prostate cancer (HCC) 12/16/1994   gleason 7   Reflux esophagitis 05/10/2008   Routine general medical examination at a health care facility    Spinal stenosis, unspecified region other than cervical 04/29/1995   Type II or unspecified type diabetes mellitus without mention of complication, uncontrolled     Past Surgical History:  Procedure Laterality Date   AIR/FLUID EXCHANGE Left 01/09/2016   Procedure: AIR/FLUID EXCHANGE;  Surgeon: Onesimo Blanch, MD;  Location: [facility] OR;  Service: Ophthalmology;  Laterality: Left;   CYSTOSCOPY W/ URETERAL STENT PLACEMENT  12/31/14  CYSTOSCOPY W/ URETERAL STENT PLACEMENT  05/16/2015   EYE SURGERY  2006   cataract, Dr Loras   LASER PHOTO ABLATION Left 01/09/2016   Procedure: LASER PHOTO ABLATION;  Surgeon: Onesimo Blanch, MD;  Location: [facility] OR;  Service: Ophthalmology;  Laterality: Left;  Endolaser   Left  ureteral stent placement  Week of 12/05/11   PARS PLANA VITRECTOMY Left 01/09/2016   Procedure: PARS PLANA VITRECTOMY WITH 25 GAUGE LEFT EYE ;  Surgeon: Onesimo Blanch, MD;  Location: [facility] OR;  Service: Ophthalmology;  Laterality: Left;   PERFLUORONE INJECTION Left 01/09/2016   Procedure: PERFLUORONE INJECTION;  Surgeon: Onesimo Blanch, MD;  Location: [facility] OR;  Service: Ophthalmology;  Laterality: Left;   PROSTATECTOMY  1996   [name]   URETERAL STENT PLACEMENT  05/2020   [facility] every 3 months    URETERAL STENT PLACEMENT  09/23/2020   Patient gets replaced every 3 months     Prior to Admission medications   Medication Sig Start Date End Date Taking? Authorizing Provider  allopurinol  (ZYLOPRIM ) 100 MG tablet TAKE 2 TABLETS BY MOUTH EVERY DAY 02/27/24  Yes Eubanks, Jessica K, NP  aspirin  81 MG tablet Take 1 tablet (81 mg total) by mouth daily. 01/14/15  Yes Arnetha Heft, MD  Calcium  Carb-Cholecalciferol  600-5 MG-MCG TABS Take 2 tablets by mouth daily. 09/07/21  Yes [provider]  Cholecalciferol  1000 UNITS tablet Take 1,000 Units by mouth daily.   Yes [provider]  darolutamide (NUBEQA) 300 MG tablet Take 300 mg by mouth in the morning and at bedtime. 06/12/24 06/12/25 Yes [provider]  doxazosin  (CARDURA ) 4 MG tablet TAKE 1 TABLET BY MOUTH EVERY DAY 04/26/24  Yes Eubanks, Jessica K, NP  leuprolide , 6 Month, (ELIGARD ) 45 MG injection Inject 45 mg into the skin every 6 (six) months. Filled by [facility] 08/20/15  Yes Franchot Hough, DO  lisinopril -hydrochlorothiazide  (ZESTORETIC ) 20-12.5 MG tablet TAKE 2 TABLETS BY MOUTH EVERY MORNING 02/27/24  Yes Eubanks, Jessica K, NP  metoprolol  tartrate (LOPRESSOR ) 50 MG tablet TAKE 1 TABLET BY MOUTH TWICE A DAY 04/26/24  Yes Eubanks, Jessica K, NP  Omega-3 Fatty Acids (FISH OIL ) 1000 MG CAPS Take 1 capsule by mouth daily.   Yes [provider]  pyridoxine (B-6) 100 MG tablet Take 100 mg by mouth daily.   Yes  [provider]  rosuvastatin  (CRESTOR ) 5 MG tablet Take 5 mg by mouth daily. 06/12/24 06/12/25 Yes [provider]  glucose blood (ONETOUCH VERIO) test strip Check blood sugar one to two times a week. Dx:E11.29 10/21/23   [name], NP    Current Facility-Administered Medications  Medication Dose Route Frequency Provider Last Rate Last Admin   acetaminophen  (TYLENOL ) tablet 1,000 mg  1,000 mg Oral Q6H PRN Segars, Dorn, MD       albuterol  (PROVENTIL ) (2.5 MG/3ML) 0.083% nebulizer solution 2.5 mg  2.5 mg Nebulization Q4H PRN Segars, Jonathan, MD       allopurinol  (ZYLOPRIM ) tablet 200 mg  200 mg Oral Daily Segars, Dorn, MD       melatonin tablet 6 mg  6 mg Oral QHS PRN Segars, Jonathan, MD       metoprolol  tartrate (LOPRESSOR ) tablet 37.5 mg  37.5 mg Oral BID Segars, Jonathan, MD       ondansetron  (ZOFRAN ) injection 4 mg  4 mg Intravenous Q6H PRN Segars, Dorn, MD       polyethylene glycol (MIRALAX  / GLYCOLAX ) packet 17 g  17 g Oral Daily  PRN Segars, Jonathan, MD       rosuvastatin  (CRESTOR ) tablet 5 mg  5 mg Oral Daily Segars, Dorn, MD       sodium chloride  flush (NS) 0.9 % injection 3 mL  3 mL Intravenous Q12H Segars, Jonathan, MD       Current Outpatient Medications  Medication Sig Dispense Refill   allopurinol  (ZYLOPRIM ) 100 MG tablet TAKE 2 TABLETS BY MOUTH EVERY DAY 180 tablet 3   aspirin  81 MG tablet Take 1 tablet (81 mg total) by mouth daily. 30 tablet 3   Calcium  Carb-Cholecalciferol  600-5 MG-MCG TABS Take 2 tablets by mouth daily.     Cholecalciferol  1000 UNITS tablet Take 1,000 Units by mouth daily.     darolutamide (NUBEQA) 300 MG tablet Take 300 mg by mouth in the morning and at bedtime.     doxazosin  (CARDURA ) 4 MG tablet TAKE 1 TABLET BY MOUTH EVERY DAY 90 tablet 3   leuprolide , 6 Month, (ELIGARD ) 45 MG injection Inject 45 mg into the skin every 6 (six) months. Filled by [facility] 1 each 1   lisinopril -hydrochlorothiazide   (ZESTORETIC ) 20-12.5 MG tablet TAKE 2 TABLETS BY MOUTH EVERY MORNING 180 tablet 3   metoprolol  tartrate (LOPRESSOR ) 50 MG tablet TAKE 1 TABLET BY MOUTH TWICE A DAY 180 tablet 3   Omega-3 Fatty Acids (FISH OIL ) 1000 MG CAPS Take 1 capsule by mouth daily.     pyridoxine (B-6) 100 MG tablet Take 100 mg by mouth daily.     rosuvastatin  (CRESTOR ) 5 MG tablet Take 5 mg by mouth daily.     glucose blood (ONETOUCH VERIO) test strip Check blood sugar one to two times a week. Dx:E11.29 100 each 3    Allergies as of 09/06/2024 - Review Complete 09/06/2024  Allergen Reaction Noted   Abiraterone Nausea And Vomiting and Other (See Comments) 05/30/2014    Family History  Problem Relation Age of Onset   Heart disease Father    Stroke Sister     Social History   Socioeconomic History   Marital status: Married    Spouse name: Not on file   Number of children: Not on file   Years of education: Not on file   Highest education level: Not on file  Occupational History   Not on file  Tobacco Use   Smoking status: Former    Types: Cigars    Quit date: 12/16/1968    Years since quitting: 55.7   Smokeless tobacco: Never  Vaping Use   Vaping status: Never Used  Substance and Sexual Activity   Alcohol use: No    Alcohol/week: 0.0 standard drinks of alcohol   Drug use: No   Sexual activity: Never  Other Topics Concern   Not on file  Social History Narrative   Not on file   Social Drivers of Health   Financial Resource Strain: Low Risk  (09/15/2018)   Overall Financial Resource Strain (CARDIA)    Difficulty of Paying Living Expenses: Not hard at all  Food Insecurity: No Food Insecurity (09/15/2018)   Hunger Vital Sign    Worried About Running Out of Food in the Last Year: Never true    Ran Out of Food in the Last Year: Never true  Transportation Needs: No Transportation Needs (09/15/2018)   PRAPARE - Administrator, Civil Service (Medical): No    Lack of Transportation  (Non-Medical): No  Physical Activity: Inactive (09/15/2018)   Exercise Vital Sign    Days of  Exercise per Week: 0 days    Minutes of Exercise per Session: 0 min  Stress: No Stress Concern Present (09/15/2018)   [employer] of Occupational Health - Occupational Stress Questionnaire    Feeling of Stress : Not at all  Social Connections: Somewhat Isolated (09/15/2018)   Social Connection and Isolation Panel    Frequency of Communication with Friends and Family: Three times a week    Frequency of Social Gatherings with Friends and Family: Once a week    Attends Religious Services: Never    Database administrator or Organizations: No    Attends Banker Meetings: Never    Marital Status: Married  Catering manager Violence: Not At Risk (09/15/2018)   Humiliation, Afraid, Rape, and Kick questionnaire    Fear of Current or Ex-Partner: No    Emotionally Abused: No    Physically Abused: No    Sexually Abused: No    Review of Systems: Pertinent positive and negative review of systems were noted in the above HPI section.  All other review of systems was otherwise negative.   Physical Exam: Vital signs in last 24 hours: Temp:  [97.7 F (36.5 C)-98 F (36.7 C)] 98 F (36.7 C) (10/23 0730) Pulse Rate:  [55-65] 59 (10/23 0800) Resp:  [16-24] 21 (10/23 0800) BP: (110-143)/(48-64) 135/63 (10/23 0800) SpO2:  [96 %-100 %] 100 % (10/23 0800) Weight:  [120.4 kg] 120.4 kg (10/23 0154)   General:   Alert,  Well-developed, well-nourished, elderly white male pleasant and cooperative in NAD, wife at bedside Head:  Normocephalic and atraumatic. Eyes:  Sclera clear, no icterus.   Conjunctiva AL. Ears:  Normal auditory acuity. Nose:  No deformity, discharge,  or lesions. Mouth:  No deformity or lesions.   Neck:  Supple; no masses or thyromegaly. Lungs:  Clear throughout to auscultation.   No wheezes, crackles, or rhonchi.  Heart:  Regular rate and rhythm; 3/6 systolic murmur  blowing Abdomen:  Soft, obese, no definite tenderness with palpation in the right lower abdomen, BS active,nonpalp mass or hsm.   Rectal: Documented heme positive Msk:  Symmetrical without gross deformities. . Pulses:  Normal pulses noted. Extremities:  Without clubbing or edema. Neurologic:  Alert and  oriented x4;  grossly normal neurologically. Skin:  Intact without significant lesions or rashes.. Psych:  Alert and cooperative. Normal mood and affect.  Intake/Output from previous day: No intake/output data recorded. Intake/Output this shift: No intake/output data recorded.  Lab Results: Recent Labs    09/05/24 1033 09/06/24 0203  WBC 7.4 7.3  HGB 6.6* 6.5*  HCT 22.0* 21.2*  PLT 157 145*   BMET Recent Labs    09/05/24 1033 09/06/24 0203  NA 142 142  K 4.9 4.1  CL 109* 114*  CO2 19* 18*  GLUCOSE 104* 124*  BUN 50* 48*  CREATININE 2.02* 2.14*  CALCIUM  9.0 8.6*   LFT Recent Labs    09/06/24 0203  PROT 5.3*  ALBUMIN 2.8*  AST 17  ALT 13  ALKPHOS 48  BILITOT 0.5   PT/INR No results for input(s): LABPROT, INR in the last 72 hours. Hepatitis Panel No results for input(s): HEPBSAG, HCVAB, HEPAIGM, HEPBIGM in the last 72 hours.   IMPRESSION:  #83 89 year old white male referred to the emergency room for admission after outpatient labs yesterday showed hemoglobin of 6.5 (down from his baseline in the 9-10 range) Patient reports that he has noticed intermittent blood mixed with his bowel movements over the past year,  and some mild change in bowel habits with looser stool as well as onset of postprandial lower abdominal discomfort over the past couple of months probably more located in the right lower quadrant.  CT imaging noncontrasted last evening concerning for a mass versus inflammatory process at the junction of the terminal ileum and the cecum with mild regional mesenteric inflammation and small regional mesenteric nodes.  Patient also has previous  diagnosis of iron deficiency anemia for which he had been undergoing iron infusions.  Rule out cecal malignancy versus less likely IBD   #2 metastatic prostate cancer #3 chronic kidney disease stage IIIb #4.  Diabetes mellitus #5.  Hypertension #6.  Chronic hydronephrosis/status post double-J stent left  #7.  Aortic valve stenosis-cardiology in process of pursuing right heart catheterization coronary angiography/TAVR protocol CTA and vascular surgery appointment.    PLAN: Transfuse 1 more unit of packed RBCs, then continue to trend hemoglobin Clear liquid diet today, then n.p.o. past midnight Patient will be scheduled for colonoscopy with Dr. Legrand for tomorrow 09/07/2024.  Plan bowel prep this evening.  Procedure was discussed in detail with the patient including indications risks and benefits and he is agreeable to proceed.  GI will follow with you He is followed by Dr. Thukkani/Cardiology-see note from yesterday we may want to communicate with cardiology to confirm reasonable to pursue sedation and colonoscopy prior to planned upcoming cardiac evaluation   Jeremiah Esterwood PA-C 09/06/2024, 9:29 AM   I have taken an interval history, thoroughly reviewed the chart and examined the patient. I agree with the Advanced Practitioner's note, impression and recommendations, and have recorded additional findings, impressions and recommendations below. I performed a substantive portion of this encounter (>50% time spent), including a complete performance of the medical decision making.  My additional thoughts are as follows:  89 year old man with multiple medical issues as noted above here for acute on chronic iron deficiency anemia with about a year of intermittent rectal bleeding and lower abdominal pain and a CT scan showing either an inflammatory or neoplastic process at the ileocecal junction. While inflammatory bowel disease is possible, malignancy is a more worrisome possibility.  He  has at least moderate aortic stenosis and is in the process of workup for consideration of TAVR.  Those plans are now on hold while working up the anemia and CT scan finding.  Cardiology consultant was present during my ED visit with this patient and his wife, and they agree with that plan.  Cardiac catheterization that was planned for next week is currently on hold pending findings of colonoscopy. Patient is also concerned that he has an upcoming ureteral stent change, but timing of that also to be determined based on colonoscopy findings and subsequent plan.  He has received PRBC transfusion (we will make sure that it is a total of 2 units given his age and cardiac comorbidity), and hopefully he can get through a bowel preparation overnight for colonoscopy with me tomorrow.  That procedure was described in detail along with the rationale, risks and benefits and he was agreeable with his wife in attendance at the bedside.  The benefits and risks of the planned procedure(s) were described in detail with the patient or (when appropriate) their health care proxy.  Risks were outlined as including, but not limited to, bleeding, infection, perforation, adverse medication reaction leading to cardiac or pulmonary decompensation, pancreatitis (if ERCP).  The limitation of incomplete mucosal visualization was also discussed.  No guarantees or warranties were given.  Patient  at increased risk for cardiopulmonary complications of procedure due to medical comorbidities.  _________________  This consultation required a high degree of medical decision making due to the nature and complexity of the acute condition(s) being evaluated as well as the patient's medical comorbidities.  Jeremiah Collins [phone]

## 2024-09-06 NOTE — ED Notes (Signed)
 Provider at bedside at this time

## 2024-09-06 NOTE — ED Notes (Signed)
 Handoff report given to RN. CCMD contacted to switch rooms.

## 2024-09-06 NOTE — ED Notes (Addendum)
 Prior unit PRBC completed in Epic. Pt was not receiving PRBC when I assumed his care.

## 2024-09-07 ENCOUNTER — Inpatient Hospital Stay (HOSPITAL_COMMUNITY)

## 2024-09-07 ENCOUNTER — Inpatient Hospital Stay (HOSPITAL_COMMUNITY): Admitting: Anesthesiology

## 2024-09-07 ENCOUNTER — Encounter (HOSPITAL_COMMUNITY)
Admission: EM | Disposition: A | Discharge status: Home / Self Care | Payer: Self-pay | Source: Home / Self Care | Attending: Family Medicine

## 2024-09-07 DIAGNOSIS — K5730 Diverticulosis of large intestine without perforation or abscess without bleeding: Secondary | ICD-10-CM

## 2024-09-07 DIAGNOSIS — I129 Hypertensive chronic kidney disease with stage 1 through stage 4 chronic kidney disease, or unspecified chronic kidney disease: Secondary | ICD-10-CM

## 2024-09-07 DIAGNOSIS — D122 Benign neoplasm of ascending colon: Secondary | ICD-10-CM

## 2024-09-07 DIAGNOSIS — K6389 Other specified diseases of intestine: Principal | ICD-10-CM

## 2024-09-07 DIAGNOSIS — K920 Hematemesis: Secondary | ICD-10-CM | POA: Diagnosis not present

## 2024-09-07 DIAGNOSIS — N1832 Chronic kidney disease, stage 3b: Secondary | ICD-10-CM | POA: Diagnosis not present

## 2024-09-07 DIAGNOSIS — D649 Anemia, unspecified: Secondary | ICD-10-CM | POA: Diagnosis not present

## 2024-09-07 DIAGNOSIS — C180 Malignant neoplasm of cecum: Secondary | ICD-10-CM | POA: Diagnosis not present

## 2024-09-07 DIAGNOSIS — D500 Iron deficiency anemia secondary to blood loss (chronic): Secondary | ICD-10-CM

## 2024-09-07 DIAGNOSIS — K921 Melena: Secondary | ICD-10-CM | POA: Diagnosis not present

## 2024-09-07 DIAGNOSIS — Z87891 Personal history of nicotine dependence: Secondary | ICD-10-CM

## 2024-09-07 HISTORY — PX: COLONOSCOPY: SHX5424

## 2024-09-07 LAB — CBC WITH DIFFERENTIAL/PLATELET
Abs Immature Granulocytes: 0.03 K/uL (ref 0.00–0.07)
Basophils Absolute: 0 K/uL (ref 0.0–0.1)
Basophils Relative: 1 %
Eosinophils Absolute: 0.1 K/uL (ref 0.0–0.5)
Eosinophils Relative: 2 %
HCT: 25.5 % — ABNORMAL LOW (ref 39.0–52.0)
Hemoglobin: 8.2 g/dL — ABNORMAL LOW (ref 13.0–17.0)
Immature Granulocytes: 0 %
Lymphocytes Relative: 22 %
Lymphs Abs: 1.7 K/uL (ref 0.7–4.0)
MCH: 28.8 pg (ref 26.0–34.0)
MCHC: 32.2 g/dL (ref 30.0–36.0)
MCV: 89.5 fL (ref 80.0–100.0)
Monocytes Absolute: 0.5 K/uL (ref 0.1–1.0)
Monocytes Relative: 6 %
Neutro Abs: 5.4 K/uL (ref 1.7–7.7)
Neutrophils Relative %: 69 %
Platelets: 142 K/uL — ABNORMAL LOW (ref 150–400)
RBC: 2.85 MIL/uL — ABNORMAL LOW (ref 4.22–5.81)
RDW: 16 % — ABNORMAL HIGH (ref 11.5–15.5)
WBC: 7.7 K/uL (ref 4.0–10.5)
nRBC: 0 % (ref 0.0–0.2)

## 2024-09-07 LAB — BASIC METABOLIC PANEL WITH GFR
Anion gap: 10 (ref 5–15)
BUN: 43 mg/dL — ABNORMAL HIGH (ref 8–23)
CO2: 20 mmol/L — ABNORMAL LOW (ref 22–32)
Calcium: 8.1 mg/dL — ABNORMAL LOW (ref 8.9–10.3)
Chloride: 109 mmol/L (ref 98–111)
Creatinine, Ser: 2.13 mg/dL — ABNORMAL HIGH (ref 0.61–1.24)
GFR, Estimated: 29 mL/min — ABNORMAL LOW (ref >60)
Glucose, Bld: 118 mg/dL — ABNORMAL HIGH (ref 70–99)
Potassium: 4.4 mmol/L (ref 3.5–5.1)
Sodium: 139 mmol/L (ref 135–145)

## 2024-09-07 SURGERY — COLONOSCOPY
Anesthesia: Monitor Anesthesia Care

## 2024-09-07 MED ORDER — PROPOFOL 10 MG/ML IV BOLUS
INTRAVENOUS | Status: DC | PRN
  Administered 2024-09-07: 20 mg via INTRAVENOUS

## 2024-09-07 MED ORDER — PHENYLEPHRINE HCL-NACL 20-0.9 MG/250ML-% IV SOLN
INTRAVENOUS | Status: DC | PRN
  Administered 2024-09-07: 30 ug/min via INTRAVENOUS

## 2024-09-07 MED ORDER — PROPOFOL 500 MG/50ML IV EMUL
INTRAVENOUS | Status: DC | PRN
  Administered 2024-09-07: 70 ug/kg/min via INTRAVENOUS

## 2024-09-07 MED ORDER — SODIUM CHLORIDE 0.9 % IV SOLN: INTRAVENOUS | Status: AC

## 2024-09-07 MED ORDER — LIDOCAINE 2% (20 MG/ML) 5 ML SYRINGE
INTRAMUSCULAR | Status: DC | PRN
  Administered 2024-09-07: 60 mg via INTRAVENOUS

## 2024-09-07 NOTE — Anesthesia Postprocedure Evaluation (Signed)
 Anesthesia Post Note  Patient: Jeremiah Collins  Procedure(s) Performed: COLONOSCOPY     Patient location during evaluation: Endoscopy Anesthesia Type: MAC Level of consciousness: awake and alert Pain management: pain level controlled Vital Signs Assessment: post-procedure vital signs reviewed and stable Respiratory status: spontaneous breathing, nonlabored ventilation, respiratory function stable and patient connected to nasal cannula oxygen Cardiovascular status: stable and blood pressure returned to baseline Postop Assessment: no apparent nausea or vomiting Anesthetic complications: no   No notable events documented.  Last Vitals:  Vitals:   09/07/24 1010 09/07/24 1020  BP: (!) 140/62 128/79  Pulse: 65 71  Resp: 17 20  Temp:    SpO2: 95% 95%    Last Pain:  Vitals:   09/07/24 1020  TempSrc:   PainSc: 0-No pain                 Rome Ade

## 2024-09-07 NOTE — Interval H&P Note (Signed)
 History and Physical Interval Note:  09/07/2024 9:11 AM  Jeremiah Collins  has presented today for surgery, with the diagnosis of colon mass, anemia, rectal bleeding.  The various methods of treatment have been discussed with the patient and family. After consideration of risks, benefits and other options for treatment, the patient has consented to  Procedure(s): COLONOSCOPY (N/A) as a surgical intervention.  The patient's history has been reviewed, patient examined, no change in status, stable for surgery.  I have reviewed the patient's chart and labs.  Questions were answered to the patient's satisfaction.    Hgb 8.2 today   Victory LITTIE Brand III

## 2024-09-07 NOTE — Progress Notes (Signed)
 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 at [facility] [phone] Iron deficiency anemia Low-flow low gradient aortic stenosis with V-max 2.6 EF 55 to 60% ---confounding factors of low blood pressures  Seen in cardiology office 10/22 with NYHA class II symptoms and routine labs were done-patient was told to go to the ED with a hemoglobin of 6.6 and BRB per rectum  Chronology  10/23-admitted-GI consulted    Pertinent imaging/studies till date  CT ABD pelvis acute mass versus inflammation junction terminal ileum and cecum regional lymphadenopathy necessary but colonoscopy may be needed to exclude tumor No acute finding otherwise chronic left renal atrophy with indwelling left double-J stent moderate sigmoid diverticulosis without diverticulitis 10/24 Colonoscopy poor prep to colon?  Malignant tumor in cecum biopsied polyps in ascending colon diverticulosis rest of exam normal   Assessment  & Plan :    GI bleed undefined?  Mass--bleeding been going on for 6 months-went to PCP but apparently not sure if he gave stool cards to them Colonoscopy as above-getting CT chest abdomen pelvis per my discussion with GI to complete staging I have reached out to oncology to set the patient up with navigator and close follow-up with oncologist as an outpatient setting and we should have an appointment hopefully in the next several weeks HFpEF-low-flow aortic stenosis His aortic stenosis is not severe appreciative of Dr. Lavona  input Currently metoprolol  25 twice daily, cutoff saline 75 cc/H in the next 24 hours Metastatic prostate cancer followed by [name] [phone] Follows at [facility] on hold until follow-up with Dr. Davis Saline as above Chronic hydronephrosis requiring stenting Defer to urology Resume doxazosin  AKI superimposed upon CKD 3B-was on ARB/ACE + HCTZ prior to coming in Baseline creatinine around 1.4-1.6 here currently it is 2-2.1 and GFR has droppe Mild metabolic acidosis Hopeful for recovery--creatinine seems about the same as it was yesterday leading me to think that this is progression of his underlying AKI No need for bicarb at this stage HTN BP elevated do not aggressively control in the setting of current bleeding   previously well-controlled possible diabetes mellitus-diabetes mellitus well-controlled no need for coverage Gout Quiescent  Data Reviewed today:   Sodium 139 potassium 4.4 BUN/creatinine 43/2.1 GFR 29 WBC 7.7 heme open 8.2 platelet 142  DVT prophylaxis: SCD  Status is: Inpatient Remains inpatient appropriate because:   Requires diagnostics testing but if hemoglobin stabilizes in the next day probably can discharge with close outpatient follow-up    Dispo/Global plan: See above   Time 40   Subjective:   Looks well feels fair No chest pain Ate a reasonable lunch Understands that he has cancer  Objective + exam Vitals:   09/07/24 1002 09/07/24 1010 09/07/24 1020 09/07/24 1556  BP: 123/62 (!) 140/62 128/79 130/63  Pulse: 70 65 71 65  Resp: 18 17 20 18   Temp: (!) 97.3 F (36.3 C)   97.6 F (36.4 C)  TempSrc: Temporal     SpO2: 95% 95%  95% 99%  Weight:      Height:       Filed Weights   09/06/24 0154 09/07/24 0900  Weight: 120.4 kg 120.4 kg   Pleasant  Examination:  EOMI NCAT no focal deficit no icterus no pallor no wheeze Chest clear Abdomen obese nontender no rebound No lower extremity edema Power 5/5  Scheduled Meds:  sodium  chloride   Intravenous Once   acetaminophen   650 mg Oral Once   allopurinol   200 mg Oral Daily   doxazosin   4 mg Oral Daily   metoprolol  tartrate  25 mg Oral BID   rosuvastatin   5 mg Oral Daily   simethicone  240 mg Oral Once   sodium chloride  flush  3 mL Intravenous Q12H   Continuous Infusions:  sodium chloride  Stopped (09/07/24 1021)   acetaminophen , albuterol , melatonin, ondansetron  (ZOFRAN ) IV, polyethylene glycol  [name], MD  [facility]

## 2024-09-07 NOTE — Op Note (Signed)
 [facility] Patient Name: Jeremiah Collins Procedure Date : 09/07/2024 MRN: [id number] Attending MD: Victory CROME. Legrand , MD, [id number] Date of Birth: 1934/11/13 CSN: [id number] Age: 89 Admit Type: Inpatient Procedure:                Colonoscopy Indications:              Hematochezia, Iron deficiency anemia secondary to                            chronic blood loss, Abnormal CT of the GI tract                            (abnormality at ICV) Providers:                Victory CROME. Legrand, MD, Jacquelyn Jaci Pierce, RN,                            Felice Sar, Technician Referring MD:             [facility]:                Monitored Anesthesia Care Complications:            No immediate complications. Estimated Blood Loss:     Estimated blood loss was minimal. Procedure:                Pre-Anesthesia Assessment:                           - Prior to the procedure, a History and Physical                            was performed, and patient medications and                            allergies were reviewed. The patient's tolerance of                            previous anesthesia was also reviewed. The risks                            and benefits of the procedure and the sedation                            options and risks were discussed with the patient.                            All questions were answered, and informed consent                            was obtained. Prior Anticoagulants: The patient has                            taken no anticoagulant or antiplatelet agents. ASA  Grade Assessment: IV - A patient with severe                            systemic disease that is a constant threat to life.                            After reviewing the risks and benefits, the patient                            was deemed in satisfactory condition to undergo the                            procedure.                           After obtaining informed  consent, the colonoscope                            was passed under direct vision. Throughout the                            procedure, the patient's blood pressure, pulse, and                            oxygen saturations were monitored continuously. The                            PCF-HQ190L ([id number]) Olympus colonoscope was                            introduced through the anus and advanced to the the                            cecum, identified by appendiceal orifice and                            ileocecal valve. The colonoscopy was somewhat                            difficult due to poor bowel prep. Successful                            completion of the procedure was aided by lavage.                            The patient tolerated the procedure well. The                            quality of the bowel preparation was poor. The                            ileocecal valve, appendiceal orifice, and rectum  were photographed. Scope In: 9:42:11 AM Scope Out: 9:55:07 AM Scope Withdrawal Time: 0 hours 9 minutes 51 seconds  Total Procedure Duration: 0 hours 12 minutes 56 seconds  Findings:      The perianal and digital rectal examinations were normal.      A large amount of semi-liquid stool was found in the entire colon.      A fungating, infiltrative and ulcerated large mass was found in the       cecum and involving the ICV. The mass was partially circumferential       (involving two-thirds of the cecal lumen circumference). Biopsies were       taken with a cold forceps for histology. AO was seen. Opening to TI       could not bee seen within the mass.      Multiple sessile polyps were found in the ascending colon. The polyps       were small in size. They were not removed (would be involved in a       resection specimen)      Multiple diverticula were found in the left colon.      Retroflexion in the rectum was not performed due to anatomy.      The exam  was otherwise without abnormality, given significant limitation       on mucosal visualization due to poor prep quality. Impression:               - Preparation of the colon was poor.                           - Stool in the entire examined colon.                           - Likely malignant tumor in the cecum. Biopsied.                           - Multiple small polyps in the ascending colon.                           - Diverticulosis in the left colon.                           - The examination was otherwise normal. Recommendation:           - Return patient to hospital ward for ongoing care.                           - Resume regular diet.                           - Await pathology results. (sent rush) Surgical                            consult after results.                           Patient will be given results and copy of procedure  report. Will speak with patient's wife in person                            later today. Hospitalist will be notified. Procedure Code(s):        --- Professional ---                           [phone], Colonoscopy, flexible; with biopsy, single                            or multiple Diagnosis Code(s):        --- Professional ---                           D49.0, Neoplasm of unspecified behavior of                            digestive system                           D12.2, Benign neoplasm of ascending colon                           K92.1, Melena (includes Hematochezia)                           D50.0, Iron deficiency anemia secondary to blood                            loss (chronic)                           K57.30, Diverticulosis of large intestine without                            perforation or abscess without bleeding                           R93.3, Abnormal findings on diagnostic imaging of                            other parts of digestive tract CPT copyright 2022 American Medical Association. All rights  reserved. The codes documented in this report are preliminary and upon coder review may  be revised to meet current compliance requirements. Olyvia Gopal L. Legrand, MD 09/07/2024 10:05:47 AM This report has been signed electronically. Number of Addenda: 0

## 2024-09-07 NOTE — Anesthesia Preprocedure Evaluation (Addendum)
 Anesthesia Evaluation  General Assessment Comment:  Patient was in workup for TAVR due to severe AS, found to have GI bleeding/anemia. Cardiologist recommended proceeding with colonoscopy without further workup/intervention.  Airway Mallampati: II  TM Distance: >3 FB Neck ROM: Full    Dental  (+) Chipped   Pulmonary former smoker   Pulmonary exam normal breath sounds clear to auscultation       Cardiovascular METS: < 3 Mets hypertension, Pt. on medications (-) CAD, (-) Cardiac Stents and (-) CABG + dysrhythmias + Valvular Problems/Murmurs AS  Rhythm:Regular Rate:Normal + Systolic murmurs 1. Left ventricular ejection fraction, by estimation, is 55 to 60%. Left  ventricular ejection fraction by 3D volume is 63 %. The left ventricle has  normal function. The left ventricle has no regional wall motion  abnormalities. There is mild concentric  left ventricular hypertrophy. The average left ventricular global  longitudinal strain is -23.5 %. The global longitudinal strain is normal.   2. RVSP estimated at 39 mmHg about RAP.SABRA Right ventricular systolic  function is normal. The right ventricular size is normal.   3. Left atrial size was moderately dilated.   4. The mitral valve is normal in structure. Mild mitral valve  regurgitation. No evidence of mitral stenosis. Severe mitral annular  calcification.   5. Tricuspid valve regurgitation is mild to moderate.   6. There is severe aortic calcification with associated moderate to  severe paradoxical low flow low gradient AS w/ PV 2.6 m/sec, MG 15 mmHg,  AVA 1.00 cm^2 (by continuity), iAVA 0.36 cm^2/m^2 and DVI 0.25. There is  also mild AI. SABRA The aortic valve is  normal in structure. There is severe calcifcation of the aortic valve.  Aortic valve regurgitation is mild. No aortic stenosis is present.   7. There is mild dilatation of the ascending aorta, measuring 41 mm.   8. The inferior vena  cava is normal in size with greater than 50%  respiratory variability, suggesting right atrial pressure of 3 mmHg.     Neuro/Psych    GI/Hepatic   Endo/Other  diabetes    Renal/GU CRFRenal disease     Musculoskeletal   Abdominal  (+) + obese  Peds  Hematology  (+) Blood dyscrasia, anemia   Anesthesia Other Findings Past Medical History: 09/12/2012: Acute bronchitis 10/28/2006: Blepharochalasis No date: Bone metastases     Comment:  left mid tibial shaft No date: Cataract     Comment:  Dr.Groat 01/17/2012: Cellulitis and abscess of leg, except foot 03/24/2004: Closed fracture of five ribs 04/25/2007: First degree atrioventricular block 04/03/14- 04/17/14: History of radiation therapy     Comment:  mid to distal left tibia 3000 cGy in 10 sessions No date: HTN (hypertension) 11/15/1998: Hx of radiation therapy     Comment:  prostate fossa - 6040 cGy, 33 fx, Dr Johney 10/22/2010: Osteoarthrosis involving, or with mention of more than  one site, but not specified as generalized, multiple sites 04/29/1991: Other abnormal blood chemistry 01/02/2013: Other and unspecified hyperlipidemia 12/16/1994: Prostate cancer (HCC)     Comment:  gleason 7 05/10/2008: Reflux esophagitis No date: Routine general medical examination at a health care facility 04/29/1995: Spinal stenosis, unspecified region other than cervical No date: Type II or unspecified type diabetes mellitus without  mention of complication, uncontrolled   Reproductive/Obstetrics                              Anesthesia Physical Anesthesia Plan  ASA: 4  Anesthesia Plan: MAC   Post-op Pain Management: Minimal or no pain anticipated   Induction: Intravenous  PONV Risk Score and Plan: 1 and Propofol  infusion, TIVA and Ondansetron   Airway Management Planned: Nasal Cannula  Additional Equipment: None  Intra-op Plan:   Post-operative Plan:   Informed Consent: I have reviewed the  patients History and Physical, chart, labs and discussed the procedure including the risks, benefits and alternatives for the proposed anesthesia with the patient or authorized representative who has indicated his/her understanding and acceptance.     Dental advisory given  Plan Discussed with: CRNA and Surgeon  Anesthesia Plan Comments: (Discussed risks of anesthesia with patient, including possibility of difficulty with spontaneous ventilation under anesthesia necessitating airway intervention, PONV, and rare risks such as cardiac or respiratory or neurological events, and allergic reactions. Discussed the role of CRNA in patient's perioperative care. Patient understands. Patient counseled on being higher risk for anesthesia due to comorbidities: severe AS. Patient was told about increased risk of cardiac and respiratory events, including death. )        Anesthesia Quick Evaluation

## 2024-09-07 NOTE — Plan of Care (Signed)
  Problem: Education: Goal: Knowledge of General Education information will improve Description: Including pain rating scale, medication(s)/side effects and non-pharmacologic comfort measures Outcome: Progressing   Problem: Clinical Measurements: Goal: Ability to maintain clinical measurements within normal limits will improve Outcome: Progressing   Problem: Coping: Goal: Level of anxiety will decrease Outcome: Progressing   

## 2024-09-07 NOTE — Transfer of Care (Signed)
 Immediate Anesthesia Transfer of Care Note  Patient: Jeremiah Collins  Procedure(s) Performed: COLONOSCOPY  Patient Location: PACU  Anesthesia Type:MAC  Level of Consciousness: awake, oriented, drowsy, and patient cooperative  Airway & Oxygen Therapy: Patient Spontanous Breathing  Post-op Assessment: Report given to RN, Post -op Vital signs reviewed and stable, and Patient moving all extremities X 4  Post vital signs: Reviewed and stable  Last Vitals:  Vitals Value Taken Time  BP 123/62 09/07/24 10:02  Temp    Pulse 71 09/07/24 10:02  Resp 20 09/07/24 10:02  SpO2 95 % 09/07/24 10:02  Vitals shown include unfiled device data.  Last Pain:  Vitals:   09/07/24 1002  TempSrc:   PainSc: 0-No pain         Complications: No notable events documented.

## 2024-09-08 DIAGNOSIS — D649 Anemia, unspecified: Secondary | ICD-10-CM | POA: Diagnosis not present

## 2024-09-08 LAB — CBC WITH DIFFERENTIAL/PLATELET
Abs Immature Granulocytes: 0.03 K/uL (ref 0.00–0.07)
Basophils Absolute: 0 K/uL (ref 0.0–0.1)
Basophils Relative: 0 %
Eosinophils Absolute: 0.2 K/uL (ref 0.0–0.5)
Eosinophils Relative: 3 %
HCT: 22.6 % — ABNORMAL LOW (ref 39.0–52.0)
Hemoglobin: 7.3 g/dL — ABNORMAL LOW (ref 13.0–17.0)
Immature Granulocytes: 0 %
Lymphocytes Relative: 23 %
Lymphs Abs: 1.7 K/uL (ref 0.7–4.0)
MCH: 28.9 pg (ref 26.0–34.0)
MCHC: 32.3 g/dL (ref 30.0–36.0)
MCV: 89.3 fL (ref 80.0–100.0)
Monocytes Absolute: 0.5 K/uL (ref 0.1–1.0)
Monocytes Relative: 6 %
Neutro Abs: 4.8 K/uL (ref 1.7–7.7)
Neutrophils Relative %: 68 %
Platelets: 122 K/uL — ABNORMAL LOW (ref 150–400)
RBC: 2.53 MIL/uL — ABNORMAL LOW (ref 4.22–5.81)
RDW: 15.9 % — ABNORMAL HIGH (ref 11.5–15.5)
WBC: 7.2 K/uL (ref 4.0–10.5)
nRBC: 0 % (ref 0.0–0.2)

## 2024-09-08 LAB — BASIC METABOLIC PANEL WITH GFR
Anion gap: 10 (ref 5–15)
BUN: 39 mg/dL — ABNORMAL HIGH (ref 8–23)
CO2: 20 mmol/L — ABNORMAL LOW (ref 22–32)
Calcium: 7.3 mg/dL — ABNORMAL LOW (ref 8.9–10.3)
Chloride: 105 mmol/L (ref 98–111)
Creatinine, Ser: 1.83 mg/dL — ABNORMAL HIGH (ref 0.61–1.24)
GFR, Estimated: 35 mL/min — ABNORMAL LOW (ref >60)
Glucose, Bld: 123 mg/dL — ABNORMAL HIGH (ref 70–99)
Potassium: 3.8 mmol/L (ref 3.5–5.1)
Sodium: 135 mmol/L (ref 135–145)

## 2024-09-08 LAB — HEMOGLOBIN AND HEMATOCRIT, BLOOD
HCT: 27.3 % — ABNORMAL LOW (ref 39.0–52.0)
Hemoglobin: 8.7 g/dL — ABNORMAL LOW (ref 13.0–17.0)

## 2024-09-08 LAB — PREPARE RBC (CROSSMATCH)

## 2024-09-08 MED ORDER — ACETAMINOPHEN 325 MG PO TABS
650.0000 mg | ORAL_TABLET | Freq: Once | ORAL | Status: AC
Start: 2024-09-08 — End: 2024-09-08
  Administered 2024-09-08: 650 mg via ORAL
  Filled 2024-09-08: qty 2

## 2024-09-08 MED ORDER — SODIUM CHLORIDE 0.9% IV SOLUTION: Freq: Once | INTRAVENOUS | Status: AC

## 2024-09-08 MED ORDER — DIPHENHYDRAMINE HCL 25 MG PO CAPS
25.0000 mg | ORAL_CAPSULE | Freq: Once | ORAL | Status: AC
  Administered 2024-09-08: 25 mg via ORAL
  Filled 2024-09-08: qty 1

## 2024-09-08 MED ORDER — FUROSEMIDE 10 MG/ML IJ SOLN
20.0000 mg | Freq: Once | INTRAMUSCULAR | Status: AC
  Administered 2024-09-08: 20 mg via INTRAVENOUS
  Filled 2024-09-08: qty 2

## 2024-09-08 NOTE — Plan of Care (Signed)
  Problem: Education: Goal: Knowledge of General Education information will improve Description: Including pain rating scale, medication(s)/side effects and non-pharmacologic comfort measures Outcome: Progressing   Problem: Clinical Measurements: Goal: Ability to maintain clinical measurements within normal limits will improve Outcome: Progressing   Problem: Coping: Goal: Level of anxiety will decrease Outcome: Progressing   

## 2024-09-08 NOTE — Hospital Course (Addendum)
 1. Persistent inflammation in the fat surrounding the cecum at the level of the terminal ileum. No discrete lesion identified on noncontrast exam. Mild thickening of the cecum mucosa in the region of the terminal ileum. Suspicious lesion within the cecum on same day colonoscopy. 2. No evidence of metastatic adenopathy in the abdomen pelvis. 3. No evidence of liver metastasis on noncontrast exam. 4. Atrophic LEFT kidney with double-J ureteral stent in place. 5. Benign LEFT adrenal myelolipoma. 6.  Aortic Atherosclerosis (ICD10-I70.0). H/o

## 2024-09-08 NOTE — Progress Notes (Signed)
 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 at [facility] [phone] Iron deficiency anemia Low-flow low gradient aortic stenosis with V-max 2.6 EF 55 to 60% ---confounding factors of low blood pressures  Seen in cardiology office 10/22 with NYHA class II symptoms and routine labs were done-patient was told to go to the ED with a hemoglobin of 6.6 and BRB per rectum  Chronology  10/23-admitted-GI consulted    Pertinent imaging/studies till date  CT ABD pelvis acute mass versus inflammation junction terminal ileum and cecum regional lymphadenopathy necessary but colonoscopy may be needed to exclude tumor No acute finding otherwise chronic left renal atrophy with indwelling left double-J stent moderate sigmoid diverticulosis without diverticulitis 10/24 Colonoscopy poor prep to colon?  Malignant tumor in cecum biopsied polyps in ascending colon diverticulosis rest of exam normal 10/25 CT chest abd pelvis [non contrast]--doesn't show any malignancy   Assessment  & Plan :    GI bleed undefined?  Mass--bleeding been going on for 6 months-went to PCP but apparently not sure if he gave stool cards to them Colonoscopy as above-noncontrast staging above GI aware of patient-I will CC oncology as an outpatient to set up close follow-up for discussion No further bleeding today no stool Anemia blood loss likely chronic from undefined GI mass Has had 2-1/2 to 3 units of blood-needed blood again If hemoglobin stays above 8-1/2 I think it  is reasonable to send him home with GI feels no other issues He will get IV iron as an outpatient HFpEF-low-flow aortic stenosis His aortic stenosis is not severe appreciative of Dr. Lavona input Currently metoprolol  25 twice daily Metastatic prostate cancer followed by [name] [phone] Follows at [facility] on hold until follow-up with [name] NSL at this time Chronic hydronephrosis requiring stenting Defer to urology Resume doxazosin  AKI superimposed upon CKD 3B-was on ARB/ACE + HCTZ prior to coming in Baseline creatinine around 1.4-1.6 here currently it is 2-2.1 and GFR has droppe Mild metabolic acidosis Hopeful for recovery--creatinine improved from admission and no more HCTZ ARB at discharge Aortic labs HTN BP elevated do not aggressively control in the setting of current bleeding   previously well-controlled possible diabetes mellitus-diabetes mellitus well-controlled no need for coverage Gout Quiescent  Data Reviewed today:   Sodium 135 potassium 3.8 BUN/creatinine 39/1.8 Hemoglobin 8.7 hematocrit 27.3 posttransfusion  DVT prophylaxis: SCD  Status is: Inpatient Remains inpatient appropriate because:   Requires diagnostics testing but if hemoglobin stabilizes in the next day probably can discharge with close outpatient follow-up    Dispo/Global plan: See above   Time 40   Subjective:   overall feels stronger No chest pain no fever no chills no nausea no vomiting No dark no tarry stool  Objective + exam Vitals:   09/08/24 0802 09/08/24 0830 09/08/24 0908 09/08/24 1125  BP: (!) 116/52 (!) 114/51 126/61 124/62  Pulse: 67 64 67 (!) 57  Resp: 18     Temp: 98.3 F (36.8 C) 98.2 F (36.8  C) 98.6 F (37 C) 97.8 F (36.6 C)  TempSrc:  Oral  Oral  SpO2: 98%  96%   Weight:      Height:       Filed Weights   09/06/24 0154 09/07/24 0900  Weight: 120.4 kg 120.4 kg   Pleasant  Examination:  EOMI NCAT no focal deficit no icterus no pallor no  wheeze Chest clear Abdomen obese nontender no rebound No lower extremity edema Power 5/5  Scheduled Meds:  sodium chloride    Intravenous Once   acetaminophen   650 mg Oral Once   allopurinol   200 mg Oral Daily   doxazosin   4 mg Oral Daily   metoprolol  tartrate  25 mg Oral BID   rosuvastatin   5 mg Oral Daily   simethicone  240 mg Oral Once   sodium chloride  flush  3 mL Intravenous Q12H   Continuous Infusions:   acetaminophen , albuterol , melatonin, ondansetron  (ZOFRAN ) IV, polyethylene glycol  Jeremiah Genifer Lazenby, MD  [facility]

## 2024-09-09 DIAGNOSIS — D649 Anemia, unspecified: Secondary | ICD-10-CM | POA: Diagnosis not present

## 2024-09-09 LAB — BPAM RBC
Blood Product Expiration Date: 202511122359
Blood Product Expiration Date: 202511152359
Blood Product Expiration Date: 202511182359
Blood Product Expiration Date: 202511202359
Blood Product Expiration Date: 202511212359
Blood Product Expiration Date: 202511212359
ISSUE DATE / TIME: 202510230424
ISSUE DATE / TIME: 202510231102
ISSUE DATE / TIME: 202510250846
ISSUE DATE / TIME: 202510231652
ISSUE DATE / TIME: 202510231652
ISSUE DATE / TIME: 202510232230
Unit Type and Rh: 5100
Unit Type and Rh: 5100
Unit Type and Rh: 5100
Unit Type and Rh: 5100
Unit Type and Rh: 5100
Unit Type and Rh: 5100

## 2024-09-09 LAB — TYPE AND SCREEN
ABO/RH(D): O POS
ABO/RH(D): O POS
Antibody Screen: NEGATIVE
Antibody Screen: NEGATIVE
Unit division: 0
Unit division: 0
Unit division: 0
Unit division: 0
Unit division: 0
Unit division: 0

## 2024-09-09 LAB — CBC
HCT: 25.7 % — ABNORMAL LOW (ref 39.0–52.0)
Hemoglobin: 8.1 g/dL — ABNORMAL LOW (ref 13.0–17.0)
MCH: 27.9 pg (ref 26.0–34.0)
MCHC: 31.5 g/dL (ref 30.0–36.0)
MCV: 88.6 fL (ref 80.0–100.0)
Platelets: 128 K/uL — ABNORMAL LOW (ref 150–400)
RBC: 2.9 MIL/uL — ABNORMAL LOW (ref 4.22–5.81)
RDW: 17.2 % — ABNORMAL HIGH (ref 11.5–15.5)
WBC: 7.1 K/uL (ref 4.0–10.5)
nRBC: 0 % (ref 0.0–0.2)

## 2024-09-09 LAB — BASIC METABOLIC PANEL WITH GFR
Anion gap: 11 (ref 5–15)
BUN: 40 mg/dL — ABNORMAL HIGH (ref 8–23)
CO2: 17 mmol/L — ABNORMAL LOW (ref 22–32)
Calcium: 7.4 mg/dL — ABNORMAL LOW (ref 8.9–10.3)
Chloride: 110 mmol/L (ref 98–111)
Creatinine, Ser: 1.74 mg/dL — ABNORMAL HIGH (ref 0.61–1.24)
GFR, Estimated: 37 mL/min — ABNORMAL LOW (ref >60)
Glucose, Bld: 114 mg/dL — ABNORMAL HIGH (ref 70–99)
Potassium: 4 mmol/L (ref 3.5–5.1)
Sodium: 138 mmol/L (ref 135–145)

## 2024-09-09 MED ORDER — ACETAMINOPHEN 500 MG PO TABS: 1000.0000 mg | ORAL_TABLET | Freq: Four times a day (QID) | ORAL | 0 refills | Status: AC | PRN

## 2024-09-09 MED ORDER — METOPROLOL TARTRATE 25 MG PO TABS: 25.0000 mg | ORAL_TABLET | Freq: Two times a day (BID) | ORAL | Status: DC

## 2024-09-09 NOTE — Plan of Care (Signed)
  Problem: Health Behavior/Discharge Planning: Goal: Ability to manage health-related needs will improve Outcome: Progressing   Problem: Clinical Measurements: Goal: Diagnostic test results will improve Outcome: Progressing Goal: Cardiovascular complication will be avoided Outcome: Progressing   

## 2024-09-09 NOTE — Progress Notes (Signed)
 Patient has received his discharge papers and now getting dressed. Patient has ordered his lunch and wants to eat before leaving. Charge nurse aware.

## 2024-09-09 NOTE — Discharge Summary (Signed)
 Physician Discharge Summary  Jeremiah Collins FMW:[id number] DOB: [birth year] DOA: 09/06/2024  PCP: Jeremiah Harlene POUR, NP  Admit date: 09/06/2024 Discharge date: 09/09/2024  Time spent: 45 minutes  Recommendations for Outpatient Follow-up:  Stop aspirin  HCTZ and lisinopril  at this time Please reschedule outpatient appointment for Jeremiah Collins at [facility] stent exchange was scheduled for 10/27 and this needs to be held until we equilibration of hemoglobin etc. Referral placed to oncology--- follow-up surgical pathology for oncological purposes performed on 10/24 from the rectal mass  Discharge Diagnoses:  MAIN problem for hospitalization   GI bleed with rectal mass  Please see below for itemized issues addressed in HOpsital- refer to other progress notes for clarity if needed  Discharge Condition: Improved  Diet recommendation: Soft diet  Filed Weights   09/06/24 0154 09/07/24 0900 09/09/24 0500  Weight: 120.4 kg 120.4 kg 119.8 kg    History of present illness:  89 year old white male healthy white male works as a patent attorney still actively doing work Known openly treated tibial fracture in the remote past 2015- DM TY 2 CKD 3B Metastatic prostate cancer diagnosed in 1996 with chronic left hydronephrosis requiring stent exchanges-on Eligard  and Nubeqa followed by Jeremiah Collins at [facility] [phone] Iron deficiency anemia Low-flow low gradient aortic stenosis with V-max 2.6 EF 55 to 60% ---confounding factors of low blood pressures   Seen in cardiology office 10/22 with NYHA class II symptoms and routine labs were done-patient was told to go to the ED with a hemoglobin of 6.6 and BRB per rectum   Chronology  10/23-admitted-GI consulted     Pertinent imaging/studies till date  CT ABD pelvis acute mass versus inflammation junction terminal ileum and cecum regional lymphadenopathy necessary but colonoscopy may be needed to exclude tumor No acute finding otherwise chronic  left renal atrophy with indwelling left double-J stent moderate sigmoid diverticulosis without diverticulitis 10/24 Colonoscopy poor prep to colon?  Malignant tumor in cecum biopsied polyps in ascending colon diverticulosis rest of exam normal 10/25 CT chest abd pelvis [non contrast]--doesn't show any malignancy    Assessment  & Plan :      GI bleed undefined?  Mass--bleeding been going on for 6 months-went to PCP but apparently not sure if he gave stool cards to them Colonoscopy as above-noncontrast staging above GI aware of patient-I will CC oncology as an outpatient to set up close follow-up for discussion No further bleeding overall just tinged stool so I have stopped ongoing aspirin  for now-he is not a candidate for resumption of anticoagulation until this is further defined Anemia blood loss likely chronic from undefined GI mass Has had 2-1/2 to 3 units of blood-he was transfused several times this hospital stay Hemoglobin has stabilized in the 8 range he feels overall better than he did when he first came in less fatigued although still somewhat debilitated I think he can go home with close follow-up and IV iron infusions HFpEF-low-flow aortic stenosis His aortic stenosis is not severe appreciative of Dr. Lavona input Currently metoprolol  25 twice daily down from 50 twice daily Metastatic prostate cancer followed by Jeremiah Collins [phone] Follows at [facility] on hold until follow-up with Jeremiah Collins NSL at this time Family has been made aware to cancel appointments--I have no mechanism of doing this currently and this can be rescheduled Chronic hydronephrosis requiring stenting Defer to urology-as an outpatient can be rescheduled Resume doxazosin  AKI superimposed upon CKD 3B-was on ARB/ACE + HCTZ prior to coming in Baseline creatinine  around 1.4-1.6 here currently it is 2-2.1 and GFR has droppe Mild metabolic acidosis Kidney function recovered nicely with IV fluid  BUN/creatinine dropped from 48/2.1-40/1.7 I would not resume any further ACE HCTZ at this time Outpatient blood pressure medications can be discussed with PCP and/or renal HTN BP elevated do not aggressively control in the setting of current bleeding   previously well-controlled possible diabetes mellitus-diabetes mellitus well-controlled no need for coverage Gout Quiescent Discharge Exam: Vitals:   09/09/24 0523 09/09/24 0730  BP: (!) 114/58 (!) 143/67  Pulse: 65 74  Resp: 16 18  Temp: 98 F (36.7 C) 98.9 F (37.2 C)  SpO2: 97% 98%    Subj on day of d/c   Looks well feels fair overall improved some weakness but feels much better than he did prior No overt dark stool no chest pain no nausea no vomiting Several questions about the next steps I explained to him that his CT scans do not show a spread but that he needs outpatient follow-up and they have made a referral I also explained that I do not have purview over urology at a different facility and asked them to please call and cancel her appointments for now  General Exam on discharge  EOMI NCAT no focal deficit no icterus no pallor no wheeze no rales no rhonchi CTAB no added sound Abdomen soft no rebound no guarding ROM intact Power 5/5 Trace lower extremity edema Euthymic coherent  Discharge Instructions   Discharge Instructions     Diet - low sodium heart healthy   Complete by: As directed    Discharge instructions   Complete by: As directed    Looking at medications carefully as several have changed-do not take any further aspirin  do not take lisinopril  or HCTZ because you have a risk of bleeding You will need follow-up with [facility] urologist Jeremiah Collins- please call them and cancel your stent exchange appointment given the fact that you are a little anemic and this may need to be done when you stabilized a little more Please resume your chemotherapy for your prostate issues  It would be a good idea for you to  set up with oncology either here or at [facility] to the choice is yours-I will place referral for oncology to see you in a week at [facility] health once the biopsy results are back so that you have some input etc. If you choose to follow-up at [facility] that is also fine just make sure that your primary sets this up properly for you  If you notice large amounts of black stool or significant bleeding let us  know or come to the emergency room   Increase activity slowly   Complete by: As directed       Allergies as of 09/09/2024       Reactions   Abiraterone Nausea And Vomiting, Other (See Comments)   Other reaction(s): Increased Heart Rate (intolerance)        Medication List     STOP taking these medications    lisinopril -hydrochlorothiazide  20-12.5 MG tablet Commonly known as: ZESTORETIC        TAKE these medications    acetaminophen  500 MG tablet Commonly known as: TYLENOL  Take 2 tablets (1,000 mg total) by mouth every 6 (six) hours as needed for mild pain (pain score 1-3).   allopurinol  100 MG tablet Commonly known as: ZYLOPRIM  TAKE 2 TABLETS BY MOUTH EVERY DAY   aspirin  81 MG tablet Take 1 tablet (81 mg  total) by mouth daily.   Calcium  Carb-Cholecalciferol  600-5 MG-MCG Tabs Take 2 tablets by mouth daily.   Cholecalciferol  25 MCG (1000 UT) tablet Take 1,000 Units by mouth daily.   darolutamide 300 MG tablet Commonly known as: NUBEQA Take 300 mg by mouth in the morning and at bedtime.   doxazosin  4 MG tablet Commonly known as: CARDURA  TAKE 1 TABLET BY MOUTH EVERY DAY   Fish Oil  1000 MG Caps Take 1 capsule by mouth daily.   leuprolide  (6 Month) 45 MG injection Commonly known as: ELIGARD  Inject 45 mg into the skin every 6 (six) months. Filled by [facility]   metoprolol  tartrate 25 MG tablet Commonly known as: LOPRESSOR  Take 1 tablet (25 mg total) by mouth 2 (two) times daily. What changed:  medication strength how much to take   OneTouch Verio  test strip Generic drug: glucose blood Check blood sugar one to two times a week. Dx:E11.29   pyridoxine 100 MG tablet Commonly known as: B-6 Take 100 mg by mouth daily.   rosuvastatin  5 MG tablet Commonly known as: CRESTOR  Take 5 mg by mouth daily.       Allergies  Allergen Reactions   Abiraterone Nausea And Vomiting and Other (See Comments)    Other reaction(s): Increased Heart Rate (intolerance)       The results of significant diagnostics from this hospitalization (including imaging, microbiology, ancillary and laboratory) are listed below for reference.    Significant Diagnostic Studies: CT CHEST ABDOMEN PELVIS WO CONTRAST Result Date: 09/07/2024 CLINICAL DATA:  Concern for malignant neoplasm of the cecum on colonoscopy. * Tracking Code: BO * EXAM: CT CHEST, ABDOMEN AND PELVIS WITHOUT CONTRAST TECHNIQUE: Multidetector CT imaging of the chest, abdomen and pelvis was performed following the standard protocol without IV contrast. RADIATION DOSE REDUCTION: This exam was performed according to the departmental dose-optimization program which includes automated exposure control, adjustment of the mA and/or kV according to patient size and/or use of iterative reconstruction technique. COMPARISON:  Noncontrast CT abdomen pelvis 1 day prior FINDINGS: CT CHEST FINDINGS Cardiovascular: Coronary artery calcification and aortic atherosclerotic calcification. Mediastinum/Nodes: No axillary or supraclavicular adenopathy. No mediastinal or hilar adenopathy. No pericardial fluid. Esophagus normal. Lungs/Pleura: No suspicious pulmonary nodules. Normal pleural. Airways normal. Musculoskeletal: Marked degenerative spurring of the spine. Aggressive osseous lesion CT ABDOMEN AND PELVIS FINDINGS Hepatobiliary: No hepatic lesion identified on noncontrast exam. Pancreas: Pancreas is normal. No ductal dilatation. No pancreatic inflammation. Spleen: Normal spleen Adrenals/urinary tract: Nodule of the LEFT  adrenal gland measuring 18 mm has a focus of macroscopic fat consistent with a benign myelolipoma. Atrophic LEFT kidney with double-J ureteral stent in place. Small amount of debris within the bladder. Stomach/Bowel: Moderate volume fluid within the stomach. Small bowel is normal. Appendix is normal. Again demonstrated inflammation in the fat surrounding the cecum at the level of the terminal ileum. No change from 1 day prior. No discrete lesion identified on noncontrast exam. Mild thickening of the mucosa at the level of the terminal ileum. LEFT colon normal Vascular/Lymphatic: Abdominal aorta is normal caliber with atherosclerotic calcification. There is no retroperitoneal or periportal lymphadenopathy. No pelvic lymphadenopathy. Reproductive: Post prostatectomy. Other: No peritoneal metastasis Musculoskeletal: No aggressive osseous lesion IMPRESSION: CHEST: No evidence of thoracic metastasis. PELVIS: 1. Persistent inflammation in the fat surrounding the cecum at the level of the terminal ileum. No discrete lesion identified on noncontrast exam. Mild thickening of the cecum mucosa in the region of the terminal ileum. Suspicious lesion within the cecum on  same day colonoscopy. 2. No evidence of metastatic adenopathy in the abdomen pelvis. 3. No evidence of liver metastasis on noncontrast exam. 4. Atrophic LEFT kidney with double-J ureteral stent in place. 5. Benign LEFT adrenal myelolipoma. 6.  Aortic Atherosclerosis (ICD10-I70.0). Electronically Signed   By: Jackquline Boxer M.D.   On: 09/07/2024 18:45   CT ABDOMEN PELVIS WO CONTRAST Result Date: 09/06/2024 EXAM: CT ABDOMEN AND PELVIS WITHOUT CONTRAST 09/06/2024 04:40:24 AM TECHNIQUE: CT of the abdomen and pelvis was performed without the administration of intravenous contrast. Multiplanar reformatted images are provided for review. Automated exposure control, iterative reconstruction, and/or weight-based adjustment of the mA/kV was utilized to reduce the  radiation dose to as low as reasonably achievable. COMPARISON: CT abdomen and pelvis 12/10/2012. CLINICAL HISTORY: 89 year old male. Acute nonlocalized abdominal pain, low HGB (hemoglobin), and reported blood in stool. FINDINGS: LOWER CHEST: Chronic cardiomegaly is stable. No pericardial effusion. Advanced calcified atherosclerosis including aortoiliac involvement. Normal caliber abdominal aorta. Lung base ventilation improved. Chronic lower rib fractures. Mild lung base atelectasis or scarring. LIVER: The liver is unremarkable. GALLBLADDER AND BILE DUCTS: Contracted gallbladder. No biliary ductal dilatation. SPLEEN: No acute abnormality. PANCREAS: Pancreatic fatty atrophy. ADRENAL GLANDS: Chronic left adrenal gland myelolipoma, benign and stable since 2014 (no follow up imaging recommended). KIDNEYS, URETERS AND BLADDER: Left renal double J ureteral stent in place, similar to the appearance in 2014. Left renal cortical atrophy since that time. No acute hydronephrosis or hydroureter. Contralateral right renal volume is better maintained although also mildly decreased. Double J stent is appropriately positioned. Urinary bladder is diminutive. No stones in the kidneys or ureters. No perinephric or periureteral stranding. GI AND BOWEL: Stomach demonstrates no acute abnormality. Decompressed stomach and duodenum. Moderate sigmoid diverticulosis, no active inflammation. Relatively mild retained stool in the large bowel. Normal appendix on series 3 image 56. Indistinct abnormal soft tissue thickening and inflammation at the ileocecal valve on series 3 image 43. Surrounding patchy mesenteric stranding. This is a short segment abnormality, encompassing about 3 cm of both the distal terminal ileum and cecum. There is a nodular soft tissue configuration (coronal image 79). Small regional mesenteric lymph nodes in addition to the mild mesenteric stranding (coronal image 71). Acute mass versus inflammation at the junction of  the terminal ileum and cecum (IC valve) on this noncontrast exam. Mild to moderate regional mesenteric inflammation. Upstream small bowel loops are nondilated and appear normal. There is no bowel obstruction. No pneumoperitoneum or free fluid. PERITONEUM AND RETROPERITONEUM: No ascites. No free air. VASCULATURE: Aorta is normal in caliber. Advanced calcified atherosclerosis including aortoiliac involvement. LYMPH NODES: Small regional mesenteric lymph nodes in addition to the mild mesenteric stranding (coronal image 71). REPRODUCTIVE ORGANS: Chronic prostate therapy and or surgical clips stable. No pelvis recluse. BONES AND SOFT TISSUES: Diffuse osteopenia. Hyperostosis in the spine with intermittent levels of spinal ankylosis. Chronic lower rib fractures. Small chronic fat containing umbilical hernia. No herniated bowel. No focal soft tissue abnormality. IMPRESSION: 1. Acute Mass versus Inflammation at the junction of the terminal ileum and cecum (IC valve, series 3 image 53) on this noncontrast exam. Mild regional mesenteric inflammation. No regional lymphadenopathy. Colonoscopy may be necessary to exclude Tumor. 2. No other acute finding. Chronic left renal atrophy with indwelling left double-J stent. Moderate sigmoid diverticulosis without evidence of diverticulitis. Advanced aortic calcified atherosclerosis. Electronically signed by: Helayne Hurst MD 09/06/2024 05:02 AM EDT RP Workstation: [id number]   ECHOCARDIOGRAM COMPLETE Result Date: 08/23/2024    ECHOCARDIOGRAM REPORT  Patient Name:   ENMANUEL ZUFALL Date of Exam: 08/23/2024 Medical Rec #:  [id number]       Height:       71.0 in Accession #:    [id number]      Weight:       265.2 lb Date of Birth:  [birth year]       BSA:          2.377 m Patient Age:    89 years        BP:           112/76 mmHg Patient Gender: M               HR:           56 bpm. Exam Location:  [address] Procedure: 2D Echo, 3D Echo and Strain Analysis (Both Spectral and Color Flow             Doppler were utilized during procedure). Indications:    I35.0 Nonrheumatic aortic (valve) stenosis  History:        Patient has prior history of Echocardiogram examinations, most                 recent 08/23/2023. Risk Factors:Hypertension, Diabetes and                 Dyslipidemia. Congestive dilated cardiomyopathy. Atherosclerosis                 of aorta. Bilateral lower extremity edema. Obesity.  Sonographer:    Jon Hacker RCS Referring Phys: [phone] MARK C SKAINS IMPRESSIONS  1. Left ventricular ejection fraction, by estimation, is 55 to 60%. Left ventricular ejection fraction by 3D volume is 63 %. The left ventricle has normal function. The left ventricle has no regional wall motion abnormalities. There is mild concentric left ventricular hypertrophy. The average left ventricular global longitudinal strain is -23.5 %. The global longitudinal strain is normal.  2. RVSP estimated at 39 mmHg about RAP.SABRA Right ventricular systolic function is normal. The right ventricular size is normal.  3. Left atrial size was moderately dilated.  4. The mitral valve is normal in structure. Mild mitral valve regurgitation. No evidence of mitral stenosis. Severe mitral annular calcification.  5. Tricuspid valve regurgitation is mild to moderate.  6. There is severe aortic calcification with associated moderate to severe paradoxical low flow low gradient AS w/ PV 2.6 m/sec, MG 15 mmHg, AVA 1.00 cm^2 (by continuity), iAVA 0.36 cm^2/m^2 and DVI 0.25. There is also mild AI. SABRA The aortic valve is normal in structure. There is severe calcifcation of the aortic valve. Aortic valve regurgitation is mild. No aortic stenosis is present.  7. There is mild dilatation of the ascending aorta, measuring 41 mm.  8. The inferior vena cava is normal in size with greater than 50% respiratory variability, suggesting right atrial pressure of 3 mmHg. Conclusion(s)/Recommendation(s): Compared to prior echo, EF is stable. AS gradient and peak  velocity similar but AVA suggesting progression to severe AS. FINDINGS  Left Ventricle: Left ventricular ejection fraction, by estimation, is 55 to 60%. Left ventricular ejection fraction by 3D volume is 63 %. The left ventricle has normal function. The left ventricle has no regional wall motion abnormalities. The average left ventricular global longitudinal strain is -23.5 %. Strain was performed and the global longitudinal strain is normal. The left ventricular internal cavity size was normal in size. There is mild concentric left ventricular hypertrophy. Left ventricular diastolic function could not  be evaluated due to mitral annular calcification (moderate or greater). Right Ventricle: RVSP estimated at 39 mmHg about RAP. The right ventricular size is normal. No increase in right ventricular wall thickness. Right ventricular systolic function is normal. Left Atrium: Left atrial size was moderately dilated. Right Atrium: Right atrial size was normal in size. Pericardium: There is no evidence of pericardial effusion. Mitral Valve: The mitral valve is normal in structure. Severe mitral annular calcification. Mild mitral valve regurgitation. No evidence of mitral valve stenosis. Tricuspid Valve: The tricuspid valve is normal in structure. Tricuspid valve regurgitation is mild to moderate. No evidence of tricuspid stenosis. Aortic Valve: There is severe aortic calcification with associated moderate to severe paradoxical low flow low gradient AS w/ PV 2.6 m/sec, MG 15 mmHg, AVA 1.00 cm^2 (by continuity), iAVA 0.36 cm^2/m^2 and DVI 0.25. There is also mild AI. The aortic valve is normal in structure. There is severe calcifcation of the aortic valve. Aortic valve regurgitation is mild. No aortic stenosis is present. Aortic valve mean gradient measures 15.0 mmHg. Aortic valve peak gradient measures 27.7 mmHg. Aortic valve area, by VTI measures 1.01 cm. Pulmonic Valve: The pulmonic valve was normal in structure. Pulmonic  valve regurgitation is mild. No evidence of pulmonic stenosis. Aorta: The aortic root is normal in size and structure. There is mild dilatation of the ascending aorta, measuring 41 mm. Venous: The inferior vena cava is normal in size with greater than 50% respiratory variability, suggesting right atrial pressure of 3 mmHg. IAS/Shunts: No atrial level shunt detected by color flow Doppler. Additional Comments: 3D was performed not requiring image post processing on an independent workstation and was normal.  LEFT VENTRICLE PLAX 2D LVIDd:         5.10 cm         Diastology LVIDs:         3.16 cm         LV e' medial:    6.74 cm/s LV PW:         1.25 cm         LV E/e' medial:  22.8 LV IVS:        1.41 cm         LV e' lateral:   7.51 cm/s LVOT diam:     2.00 cm         LV E/e' lateral: 20.5 LV SV:         69 LV SV Index:   29              2D Longitudinal LVOT Area:     3.14 cm        Strain                                2D Strain GLS   -24.4 %                                (A4C):                                2D Strain GLS   -22.3 %                                (A3C):  2D Strain GLS   -23.8 %                                (A2C):                                2D Strain GLS   -23.5 %                                Avg:                                 3D Volume EF                                LV 3D EF:    Left                                             ventricul                                             ar                                             ejection                                             fraction                                             by 3D                                             volume is                                             63 %.                                 3D Volume EF:                                3D EF:        63 %  LV EDV:       169 ml                                LV ESV:       63 ml                                 LV SV:        106 ml RIGHT VENTRICLE RV S prime:     11.00 cm/s TAPSE (M-mode): 2.3 cm RVSP:           38.8 mmHg LEFT ATRIUM              Index        RIGHT ATRIUM           Index LA diam:        5.60 cm  2.36 cm/m   RA Pressure: 3.00 mmHg LA Vol (A2C):   98.7 ml  41.52 ml/m  RA Area:     16.60 cm LA Vol (A4C):   130.0 ml 54.69 ml/m  RA Volume:   34.30 ml  14.43 ml/m LA Biplane Vol: 113.0 ml 47.53 ml/m  AORTIC VALVE AV Area (Vmax):    0.97 cm AV Area (Vmean):   1.00 cm AV Area (VTI):     1.01 cm AV Vmax:           263.00 cm/s AV Vmean:          170.000 cm/s AV VTI:            0.681 m AV Peak Grad:      27.7 mmHg AV Mean Grad:      15.0 mmHg LVOT Vmax:         80.90 cm/s LVOT Vmean:        54.200 cm/s LVOT VTI:          0.219 m LVOT/AV VTI ratio: 0.32  AORTA Ao Root diam: 4.00 cm Ao Asc diam:  4.20 cm MITRAL VALVE                TRICUSPID VALVE MV Area (PHT): 4.06 cm     TR Peak grad:   35.8 mmHg MV Decel Time: 187 msec     TR Vmax:        299.00 cm/s MV E velocity: 154.00 cm/s  Estimated RAP:  3.00 mmHg MV A velocity: 104.00 cm/s  RVSP:           38.8 mmHg MV E/A ratio:  1.48                             SHUNTS                             Systemic VTI:  0.22 m                             Systemic Diam: 2.00 cm Joelle Azobou Tonleu Electronically signed by Joelle Cedars Tonleu Signature Date/Time: 08/23/2024/1:19:41 PM    Final     Microbiology: No results found for this or any previous visit (from the past 240 hours).   Labs: Basic Metabolic Panel: Recent Labs  Lab 09/05/24 1033 09/06/24 0203 09/07/24  9564 09/08/24 0646 09/09/24 0608  NA 142 142 139 135 138  K 4.9 4.1 4.4 3.8 4.0  CL 109* 114* 109 105 110  CO2 19* 18* 20* 20* 17*  GLUCOSE 104* 124* 118* 123* 114*  BUN 50* 48* 43* 39* 40*  CREATININE 2.02* 2.14* 2.13* 1.83* 1.74*  CALCIUM  9.0 8.6* 8.1* 7.3* 7.4*   Liver Function Tests: Recent Labs  Lab 09/06/24 0203  AST 17  ALT 13  ALKPHOS 48  BILITOT 0.5  PROT 5.3*   ALBUMIN 2.8*   No results for input(s): LIPASE, AMYLASE in the last 168 hours. No results for input(s): AMMONIA in the last 168 hours. CBC: Recent Labs  Lab 09/05/24 1033 09/06/24 0203 09/06/24 0203 09/06/24 1400 09/07/24 0435 09/08/24 0646 09/08/24 1303 09/09/24 0608  WBC 7.4 7.3  --   --  7.7 7.2  --  7.1  NEUTROABS  --   --   --   --  5.4 4.8  --   --   HGB 6.6* 6.5*   < > 7.5* 8.2* 7.3* 8.7* 8.1*  HCT 22.0* 21.2*   < > 23.3* 25.5* 22.6* 27.3* 25.7*  MCV 92 91.4  --   --  89.5 89.3  --  88.6  PLT 157 145*  --   --  142* 122*  --  128*   < > = values in this interval not displayed.   Cardiac Enzymes: No results for input(s): CKTOTAL, CKMB, CKMBINDEX, TROPONINI in the last 168 hours. BNP: BNP (last 3 results) No results for input(s): BNP in the last 8760 hours.  ProBNP (last 3 results) No results for input(s): PROBNP in the last 8760 hours.  CBG: No results for input(s): GLUCAP in the last 168 hours.  Signed:  Colen Grimes MD   [facility] 09/09/2024, 10:26 AM

## 2024-09-10 ENCOUNTER — Encounter (HOSPITAL_COMMUNITY): Payer: Self-pay | Admitting: Gastroenterology

## 2024-09-10 ENCOUNTER — Telehealth: Payer: Self-pay

## 2024-09-10 ENCOUNTER — Other Ambulatory Visit: Payer: Self-pay

## 2024-09-10 ENCOUNTER — Telehealth: Payer: Self-pay | Admitting: Physician Assistant

## 2024-09-10 DIAGNOSIS — C189 Malignant neoplasm of colon, unspecified: Secondary | ICD-10-CM

## 2024-09-10 NOTE — Telephone Encounter (Signed)
 Spoke with patient surgery rescheduled to 10/17/2024 at [facility] with Dr.Davis New surgery letter mailed

## 2024-09-10 NOTE — Telephone Encounter (Signed)
 Mr. Minder would like to know if his procedure will be rescheduled. He states that he didn't know it was canceled for today. Please advise.

## 2024-09-10 NOTE — Telephone Encounter (Addendum)
-----   Message from Jeremiah Collins sent at 09/09/2024 10:42 AM EDT ----- This patient had an inpatient colonoscopy on Friday with a malignant appearing mass in the cecum.  No pathology over weekend, suspect the results will return Monday or Tuesday.  Please be on the look out for this and involve doc of day to call patient and/or wife to give final results.  They are aware this is likely a malignancy.  He was not seen inpatient by oncology or surgery, but the [facility] physician who discharged him told me they sent a referral to medical oncology.  When the results come back, please send an additional referral to the [facility] cancer center coordinator, and he also needs a referral to colorectal surgery at [facility].  _________    HD

## 2024-09-10 NOTE — Telephone Encounter (Signed)
  [facility]   MULTIDISCIPLINARY HEART VALVE TEAM   Pt was recently seen by Dr. Wendel for evaluation of severe AS and the TAVR work up was initiated. He was set up for a L/RHC on 09/12/24. Since his initial evaluation he was admitted for acute blood loss anemia with hg down to 6.5. Colonoscopy showed a malignant appearing mass in the cecum. Path showed invasive moderately differentiated colonic adenocarcinoma. He has been referred to medical oncology and [facility]. We will hold off on heart catheterization until oncology work up is complete. Pt notified and appreciative of call.   We will follow along with you.   Lamarr Hummer PA-C  [facility]

## 2024-09-10 NOTE — Telephone Encounter (Signed)
 Patient notified by provider this morning. Referral marked as urgent sent to [facility]. New referral placed in addition to the one from another provider, to [facility].

## 2024-09-10 NOTE — Telephone Encounter (Signed)
 Dr.Davis please see message and advise when to reschedule ?  Thanks

## 2024-09-12 ENCOUNTER — Encounter (HOSPITAL_COMMUNITY): Payer: Self-pay

## 2024-09-12 ENCOUNTER — Ambulatory Visit: Payer: Self-pay | Admitting: Gastroenterology

## 2024-09-12 ENCOUNTER — Ambulatory Visit (HOSPITAL_COMMUNITY): Admit: 2024-09-12 | Admitting: Internal Medicine

## 2024-09-12 SURGERY — RIGHT/LEFT HEART CATH AND CORONARY ANGIOGRAPHY
Anesthesia: LOCAL

## 2024-09-17 ENCOUNTER — Inpatient Hospital Stay: Attending: Hematology | Admitting: Hematology

## 2024-09-17 ENCOUNTER — Encounter: Payer: Self-pay | Admitting: Hematology

## 2024-09-17 ENCOUNTER — Inpatient Hospital Stay

## 2024-09-17 VITALS — BP 146/60 | HR 71 | Temp 98.2°F | Resp 14 | Ht 71.0 in | Wt 261.5 lb

## 2024-09-17 DIAGNOSIS — N183 Chronic kidney disease, stage 3 unspecified: Secondary | ICD-10-CM | POA: Diagnosis not present

## 2024-09-17 DIAGNOSIS — D649 Anemia, unspecified: Secondary | ICD-10-CM

## 2024-09-17 DIAGNOSIS — C180 Malignant neoplasm of cecum: Secondary | ICD-10-CM | POA: Insufficient documentation

## 2024-09-17 DIAGNOSIS — I350 Nonrheumatic aortic (valve) stenosis: Secondary | ICD-10-CM | POA: Insufficient documentation

## 2024-09-17 DIAGNOSIS — Z87891 Personal history of nicotine dependence: Secondary | ICD-10-CM | POA: Diagnosis not present

## 2024-09-17 DIAGNOSIS — C61 Malignant neoplasm of prostate: Secondary | ICD-10-CM | POA: Diagnosis not present

## 2024-09-17 DIAGNOSIS — C7951 Secondary malignant neoplasm of bone: Secondary | ICD-10-CM | POA: Diagnosis not present

## 2024-09-17 DIAGNOSIS — D500 Iron deficiency anemia secondary to blood loss (chronic): Secondary | ICD-10-CM | POA: Insufficient documentation

## 2024-09-17 DIAGNOSIS — C7982 Secondary malignant neoplasm of genital organs: Secondary | ICD-10-CM | POA: Diagnosis not present

## 2024-09-17 DIAGNOSIS — I129 Hypertensive chronic kidney disease with stage 1 through stage 4 chronic kidney disease, or unspecified chronic kidney disease: Secondary | ICD-10-CM | POA: Diagnosis not present

## 2024-09-17 DIAGNOSIS — E1122 Type 2 diabetes mellitus with diabetic chronic kidney disease: Secondary | ICD-10-CM | POA: Diagnosis not present

## 2024-09-17 DIAGNOSIS — Z79899 Other long term (current) drug therapy: Secondary | ICD-10-CM | POA: Insufficient documentation

## 2024-09-17 LAB — COMPREHENSIVE METABOLIC PANEL WITH GFR
ALT: 12 U/L (ref 0–44)
AST: 15 U/L (ref 15–41)
Albumin: 3.6 g/dL (ref 3.5–5.0)
Alkaline Phosphatase: 63 U/L (ref 38–126)
Anion gap: 6 (ref 5–15)
BUN: 29 mg/dL — ABNORMAL HIGH (ref 8–23)
CO2: 23 mmol/L (ref 22–32)
Calcium: 9 mg/dL (ref 8.9–10.3)
Chloride: 110 mmol/L (ref 98–111)
Creatinine, Ser: 1.48 mg/dL — ABNORMAL HIGH (ref 0.61–1.24)
GFR, Estimated: 45 mL/min — ABNORMAL LOW (ref >60)
Glucose, Bld: 126 mg/dL — ABNORMAL HIGH (ref 70–99)
Potassium: 4.2 mmol/L (ref 3.5–5.1)
Sodium: 139 mmol/L (ref 135–145)
Total Bilirubin: 0.4 mg/dL (ref 0.0–1.2)
Total Protein: 6.1 g/dL — ABNORMAL LOW (ref 6.5–8.1)

## 2024-09-17 LAB — CBC WITH DIFFERENTIAL/PLATELET
Abs Immature Granulocytes: 0.02 K/uL (ref 0.00–0.07)
Basophils Absolute: 0 K/uL (ref 0.0–0.1)
Basophils Relative: 1 %
Eosinophils Absolute: 0.2 K/uL (ref 0.0–0.5)
Eosinophils Relative: 2 %
HCT: 27.6 % — ABNORMAL LOW (ref 39.0–52.0)
Hemoglobin: 9.1 g/dL — ABNORMAL LOW (ref 13.0–17.0)
Immature Granulocytes: 0 %
Lymphocytes Relative: 22 %
Lymphs Abs: 1.8 K/uL (ref 0.7–4.0)
MCH: 28.4 pg (ref 26.0–34.0)
MCHC: 33 g/dL (ref 30.0–36.0)
MCV: 86.3 fL (ref 80.0–100.0)
Monocytes Absolute: 0.5 K/uL (ref 0.1–1.0)
Monocytes Relative: 6 %
Neutro Abs: 5.8 K/uL (ref 1.7–7.7)
Neutrophils Relative %: 69 %
Platelets: 165 K/uL (ref 150–400)
RBC: 3.2 MIL/uL — ABNORMAL LOW (ref 4.22–5.81)
RDW: 16.3 % — ABNORMAL HIGH (ref 11.5–15.5)
WBC: 8.3 K/uL (ref 4.0–10.5)
nRBC: 0 % (ref 0.0–0.2)

## 2024-09-17 LAB — IRON AND IRON BINDING CAPACITY (CC-WL,HP ONLY)
Iron: 29 ug/dL — ABNORMAL LOW (ref 45–182)
Saturation Ratios: 9 % — ABNORMAL LOW (ref 17.9–39.5)
TIBC: 330 ug/dL (ref 250–450)
UIBC: 301 ug/dL (ref 117–376)

## 2024-09-17 LAB — VITAMIN B12: Vitamin B-12: 500 pg/mL (ref 180–914)

## 2024-09-17 LAB — FERRITIN: Ferritin: 37 ng/mL (ref 24–336)

## 2024-09-17 NOTE — Progress Notes (Addendum)
 PATIENT NAVIGATOR PROGRESS NOTE  Name: Jeremiah Collins Date: 09/17/2024 MRN: [id number]  DOB: 1934-02-22   Reason for visit:  Initial Med/Onc appt with Dr. Onita Mattock  Comments:   Patient was seen during his initial appt with Dr. Mattock.  Patient was accompanied by his wife. Patient was given a [facility] binder with information specific to his diagnosis.  Patient was given my contact information and was instructed to contact office with any questions or concerns.   SDOH assessment was completed. No urgent needs were noted.    Time spent counseling/coordinating care: > 60 minutes

## 2024-09-17 NOTE — Progress Notes (Signed)
 [facility]   [phone] [phone]   Clinic New Consult Note   Patient Care Team: [name], NP as PCP - General (Geriatric Medicine) Jeffrie Oneil BROCKS, MD as PCP - Cardiology (Cardiology) Nicholaus Tanda LITTIE DOUGLAS, MD as Attending Physician (Urology) Robinson Idol, MD as Consulting Physician (Ophthalmology) Ardis Evalene LITTIE, RN as Oncology Nurse Navigator 09/17/2024  CHIEF COMPLAINTS/PURPOSE OF CONSULTATION:  Newly diagnosed cecal adenocarcinoma  REFERRING PHYSICIAN: Dr. Marinda  Discussed the use of AI scribe software for clinical note transcription with the patient, who gave verbal consent to proceed.  History of Present Illness Jeremiah Collins is an 89 year old male with newly diagnosed cecal cancer who presents for a new consult. He is accompanied by his wife, Jeremiah Collins. He was referred by his GI Dr. Marinda after a recent hospital admission.  Rectal bleeding has been intermittent and presented for approximately a year.  He was diagnosed with iron deficiency earlier this year and did receive IV iron during the summer of this year.  He was found to have hemoglobin 6.5 on September 06, 2024 by his cardiologist, and was referred to hospital for blood transfusion.  A colonoscopy during the hospital stay revealed a mass in the cecum, with a biopsy confirming moderately differentiated adenocarcinoma. Bowel movements are irregular, alternating between constipation and diarrhea. There is no significant weight loss, nausea, or changes in appetite.  He has a history of prostate cancer, initially diagnosed in 1996, with biochemical recurrence in 1999 treated with radiation. The cancer metastasized to his penis and bones around 2013-2014. He has been on various treatments including Lupron  shots since 2014 and abiraterone from 2015, later switched to Nubeqa. PSA levels are currently well-controlled with most recent one 0.03 in October 2025.  He also has  aortic valve stenosis, seen by cardiologist Dr. Wendel at [facility].  He was last seen in mid October 2025, and was recommended to have an evaluation for aortic valve intervention.  He also sees a urologist at the [facility] for his urethral stent exchange, next scheduled in early December.  He has other comorbidities including type 2 diabetes, hypertension, and stage III CKD.  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  He came in a wheelchair with his wife today.   MEDICAL HISTORY:  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/28/2006   Bone metastases    left mid tibial shaft   Cataract    Dr.Groat   Cellulitis and abscess of leg, except foot 01/17/2012   Closed fracture of five ribs 03/24/2004   First degree atrioventricular block 04/25/2007   History of radiation therapy 04/03/14- 04/17/14   mid to distal left tibia 3000 cGy in 10 sessions   HTN (hypertension)    Hx of radiation therapy 11/15/1998   prostate fossa - 6040 cGy, 33 fx, Dr Johney   Osteoarthrosis involving, or with mention of more than one site, but not specified as generalized, multiple sites 10/22/2010   Other abnormal blood chemistry 04/29/1991   Other and unspecified hyperlipidemia 01/02/2013   Prostate cancer (HCC) 12/16/1994   gleason 7   Reflux esophagitis 05/10/2008   Routine general medical examination at a health care facility    Spinal stenosis, unspecified region other than cervical 04/29/1995   Type II or unspecified type diabetes mellitus without mention  of complication, uncontrolled     SURGICAL HISTORY: Past Surgical History:  Procedure Laterality Date   AIR/FLUID EXCHANGE Left 01/09/2016   Procedure: AIR/FLUID EXCHANGE;  Surgeon: Onesimo Blanch, MD;  Location: [facility] OR;  Service: Ophthalmology;  Laterality: Left;   COLONOSCOPY N/A 09/07/2024   Procedure:  COLONOSCOPY;  Surgeon: Legrand Victory LITTIE DOUGLAS, MD;  Location: [facility] ENDOSCOPY;  Service: Gastroenterology;  Laterality: N/A;   CYSTOSCOPY W/ URETERAL STENT PLACEMENT  12/31/14   CYSTOSCOPY W/ URETERAL STENT PLACEMENT  05/16/2015   EYE SURGERY  2006   cataract, Dr Loras   LASER PHOTO ABLATION Left 01/09/2016   Procedure: LASER PHOTO ABLATION;  Surgeon: Onesimo Blanch, MD;  Location: [facility] OR;  Service: Ophthalmology;  Laterality: Left;  Endolaser   Left ureteral stent placement  Week of 12/05/11   PARS PLANA VITRECTOMY Left 01/09/2016   Procedure: PARS PLANA VITRECTOMY WITH 25 GAUGE LEFT EYE ;  Surgeon: Onesimo Blanch, MD;  Location: [facility] OR;  Service: Ophthalmology;  Laterality: Left;   PERFLUORONE INJECTION Left 01/09/2016   Procedure: PERFLUORONE INJECTION;  Surgeon: Onesimo Blanch, MD;  Location: [facility] OR;  Service: Ophthalmology;  Laterality: Left;   PROSTATECTOMY  1996   [name]   URETERAL STENT PLACEMENT  05/2020   [facility] every 3 months    URETERAL STENT PLACEMENT  09/23/2020   Patient gets replaced every 3 months     SOCIAL HISTORY: Social History   Socioeconomic History   Marital status: Married    Spouse name: Not on file   Number of children: 0   Years of education: Not on file   Highest education level: Not on file  Occupational History   Not on file  Tobacco Use   Smoking status: Former    Types: Cigars    Quit date: 12/16/1968    Years since quitting: 55.7   Smokeless tobacco: Never  Vaping Use   Vaping status: Never Used  Substance and Sexual Activity   Alcohol use: No    Alcohol/week: 0.0 standard drinks of alcohol   Drug use: No   Sexual activity: Never  Other Topics Concern   Not on file  Social History Narrative   Not on file   Social Drivers of Health   Financial Resource Strain: Low Risk  (09/15/2018)   Overall Financial Resource Strain (CARDIA)    Difficulty of Paying Living Expenses: Not hard at all  Food Insecurity: No Food Insecurity (09/17/2024)   Hunger  Vital Sign    Worried About Running Out of Food in the Last Year: Never true    Ran Out of Food in the Last Year: Never true  Transportation Needs: No Transportation Needs (09/17/2024)   PRAPARE - Administrator, Civil Service (Medical): No    Lack of Transportation (Non-Medical): No  Physical Activity: Inactive (09/15/2018)   Exercise Vital Sign    Days of Exercise per Week: 0 days    Minutes of Exercise per Session: 0 min  Stress: No Stress Concern Present (09/15/2018)   [employer] of Occupational Health - Occupational Stress Questionnaire    Feeling of Stress : Not at all  Social Connections: Moderately Integrated (09/06/2024)   Social Connection and Isolation Panel    Frequency of Communication with Friends and Family: More than three times a week    Frequency of Social Gatherings with Friends and Family: Once a week    Attends Religious Services: Never    Database Administrator or Organizations: Yes  Attends Banker Meetings: 1 to 4 times per year    Marital Status: Married  Catering Manager Violence: Not At Risk (09/17/2024)   Humiliation, Afraid, Rape, and Kick questionnaire    Fear of Current or Ex-Partner: No    Emotionally Abused: No    Physically Abused: No    Sexually Abused: No    FAMILY HISTORY: Family History  Problem Relation Age of Onset   Heart disease Father    Stroke Sister    Cancer Nephew        unknown type cancer    ALLERGIES:  is allergic to abiraterone.  MEDICATIONS:  Current Outpatient Medications  Medication Sig Dispense Refill   acetaminophen  (TYLENOL ) 500 MG tablet Take 2 tablets (1,000 mg total) by mouth every 6 (six) hours as needed for mild pain (pain score 1-3). 30 tablet 0   allopurinol  (ZYLOPRIM ) 100 MG tablet TAKE 2 TABLETS BY MOUTH EVERY DAY 180 tablet 3   Calcium  Carb-Cholecalciferol  600-5 MG-MCG TABS Take 2 tablets by mouth daily.     Cholecalciferol  1000 UNITS tablet Take 1,000 Units by mouth daily.      darolutamide (NUBEQA) 300 MG tablet Take 300 mg by mouth in the morning and at bedtime.     doxazosin  (CARDURA ) 4 MG tablet TAKE 1 TABLET BY MOUTH EVERY DAY 90 tablet 3   glucose blood (ONETOUCH VERIO) test strip Check blood sugar one to two times a week. Dx:E11.29 100 each 3   leuprolide , 6 Month, (ELIGARD ) 45 MG injection Inject 45 mg into the skin every 6 (six) months. Filled by [facility] 1 each 1   metoprolol  tartrate (LOPRESSOR ) 25 MG tablet Take 1 tablet (25 mg total) by mouth 2 (two) times daily.     Omega-3 Fatty Acids (FISH OIL ) 1000 MG CAPS Take 1 capsule by mouth daily.     pyridoxine (B-6) 100 MG tablet Take 100 mg by mouth daily.     rosuvastatin  (CRESTOR ) 5 MG tablet Take 5 mg by mouth daily.     No current facility-administered medications for this visit.    REVIEW OF SYSTEMS:   Constitutional: Denies fevers, chills or abnormal night sweats Eyes: Denies blurriness of vision, double vision or watery eyes Ears, nose, mouth, throat, and face: Denies mucositis or sore throat Respiratory: Denies cough, dyspnea or wheezes Cardiovascular: Denies palpitation, chest discomfort or lower extremity swelling Gastrointestinal:  Denies nausea, heartburn or change in bowel habits Skin: Denies abnormal skin rashes Lymphatics: Denies new lymphadenopathy or easy bruising Neurological:Denies numbness, tingling or new weaknesses Behavioral/Psych: Mood is stable, no new changes  All other systems were reviewed with the patient and are negative.  PHYSICAL EXAMINATION: ECOG PERFORMANCE STATUS: 2 - Symptomatic, <50% confined to bed  Vitals:   09/17/24 1400 09/17/24 1446  BP: (!) 149/67 (!) 146/60  Pulse: 71   Resp: 14   Temp: 98.2 F (36.8 C)   SpO2: 100%    Filed Weights   09/17/24 1400  Weight: 261 lb 8 oz (118.6 kg)    GENERAL:alert, no distress and comfortable SKIN: skin color, texture, turgor are normal, no rashes or significant lesions EYES: normal, conjunctiva are  pink and non-injected, sclera clear OROPHARYNX:no exudate, no erythema and lips, buccal mucosa, and tongue normal  NECK: supple, thyroid  normal size, non-tender, without nodularity LYMPH:  no palpable lymphadenopathy in the cervical, axillary or inguinal LUNGS: clear to auscultation and percussion with normal breathing effort HEART: regular rate & rhythm, (+) bilateral lower extremity edema  ABDOMEN:abdomen soft, non-tender and normal bowel sounds Musculoskeletal:no cyanosis of digits and no clubbing  PSYCH: alert & oriented x 3 with fluent speech NEURO: no focal motor/sensory deficits  Physical Exam   LABORATORY DATA:  I have reviewed the data as listed    Latest Ref Rng & Units 09/09/2024    6:08 AM 09/08/2024    1:03 PM 09/08/2024    6:46 AM  CBC  WBC 4.0 - 10.5 K/uL 7.1   7.2   Hemoglobin 13.0 - 17.0 g/dL 8.1  8.7  7.3   Hematocrit 39.0 - 52.0 % 25.7  27.3  22.6   Platelets 150 - 400 K/uL 128   122       Latest Ref Rng & Units 09/09/2024    6:08 AM 09/08/2024    6:46 AM 09/07/2024    4:35 AM  CMP  Glucose 70 - 99 mg/dL 885  876  881   BUN 8 - 23 mg/dL 40  39  43   Creatinine 0.61 - 1.24 mg/dL 8.25  8.16  7.86   Sodium 135 - 145 mmol/L 138  135  139   Potassium 3.5 - 5.1 mmol/L 4.0  3.8  4.4   Chloride 98 - 111 mmol/L 110  105  109   CO2 22 - 32 mmol/L 17  20  20    Calcium  8.9 - 10.3 mg/dL 7.4  7.3  8.1      RADIOGRAPHIC STUDIES: I have personally reviewed the radiological images as listed and agreed with the findings in the report. CT CHEST ABDOMEN PELVIS WO CONTRAST Result Date: 09/07/2024 CLINICAL DATA:  Concern for malignant neoplasm of the cecum on colonoscopy. * Tracking Code: BO * EXAM: CT CHEST, ABDOMEN AND PELVIS WITHOUT CONTRAST TECHNIQUE: Multidetector CT imaging of the chest, abdomen and pelvis was performed following the standard protocol without IV contrast. RADIATION DOSE REDUCTION: This exam was performed according to the departmental  dose-optimization program which includes automated exposure control, adjustment of the mA and/or kV according to patient size and/or use of iterative reconstruction technique. COMPARISON:  Noncontrast CT abdomen pelvis 1 day prior FINDINGS: CT CHEST FINDINGS Cardiovascular: Coronary artery calcification and aortic atherosclerotic calcification. Mediastinum/Nodes: No axillary or supraclavicular adenopathy. No mediastinal or hilar adenopathy. No pericardial fluid. Esophagus normal. Lungs/Pleura: No suspicious pulmonary nodules. Normal pleural. Airways normal. Musculoskeletal: Marked degenerative spurring of the spine. Aggressive osseous lesion CT ABDOMEN AND PELVIS FINDINGS Hepatobiliary: No hepatic lesion identified on noncontrast exam. Pancreas: Pancreas is normal. No ductal dilatation. No pancreatic inflammation. Spleen: Normal spleen Adrenals/urinary tract: Nodule of the LEFT adrenal gland measuring 18 mm has a focus of macroscopic fat consistent with a benign myelolipoma. Atrophic LEFT kidney with double-J ureteral stent in place. Small amount of debris within the bladder. Stomach/Bowel: Moderate volume fluid within the stomach. Small bowel is normal. Appendix is normal. Again demonstrated inflammation in the fat surrounding the cecum at the level of the terminal ileum. No change from 1 day prior. No discrete lesion identified on noncontrast exam. Mild thickening of the mucosa at the level of the terminal ileum. LEFT colon normal Vascular/Lymphatic: Abdominal aorta is normal caliber with atherosclerotic calcification. There is no retroperitoneal or periportal lymphadenopathy. No pelvic lymphadenopathy. Reproductive: Post prostatectomy. Other: No peritoneal metastasis Musculoskeletal: No aggressive osseous lesion IMPRESSION: CHEST: No evidence of thoracic metastasis. PELVIS: 1. Persistent inflammation in the fat surrounding the cecum at the level of the terminal ileum. No discrete lesion identified on noncontrast  exam. Mild thickening of the  cecum mucosa in the region of the terminal ileum. Suspicious lesion within the cecum on same day colonoscopy. 2. No evidence of metastatic adenopathy in the abdomen pelvis. 3. No evidence of liver metastasis on noncontrast exam. 4. Atrophic LEFT kidney with double-J ureteral stent in place. 5. Benign LEFT adrenal myelolipoma. 6.  Aortic Atherosclerosis (ICD10-I70.0). Electronically Signed   By: Jackquline Boxer M.D.   On: 09/07/2024 18:45   CT ABDOMEN PELVIS WO CONTRAST Result Date: 09/06/2024 EXAM: CT ABDOMEN AND PELVIS WITHOUT CONTRAST 09/06/2024 04:40:24 AM TECHNIQUE: CT of the abdomen and pelvis was performed without the administration of intravenous contrast. Multiplanar reformatted images are provided for review. Automated exposure control, iterative reconstruction, and/or weight-based adjustment of the mA/kV was utilized to reduce the radiation dose to as low as reasonably achievable. COMPARISON: CT abdomen and pelvis 12/10/2012. CLINICAL HISTORY: 89 year old male. Acute nonlocalized abdominal pain, low HGB (hemoglobin), and reported blood in stool. FINDINGS: LOWER CHEST: Chronic cardiomegaly is stable. No pericardial effusion. Advanced calcified atherosclerosis including aortoiliac involvement. Normal caliber abdominal aorta. Lung base ventilation improved. Chronic lower rib fractures. Mild lung base atelectasis or scarring. LIVER: The liver is unremarkable. GALLBLADDER AND BILE DUCTS: Contracted gallbladder. No biliary ductal dilatation. SPLEEN: No acute abnormality. PANCREAS: Pancreatic fatty atrophy. ADRENAL GLANDS: Chronic left adrenal gland myelolipoma, benign and stable since 2014 (no follow up imaging recommended). KIDNEYS, URETERS AND BLADDER: Left renal double J ureteral stent in place, similar to the appearance in 2014. Left renal cortical atrophy since that time. No acute hydronephrosis or hydroureter. Contralateral right renal volume is better maintained although  also mildly decreased. Double J stent is appropriately positioned. Urinary bladder is diminutive. No stones in the kidneys or ureters. No perinephric or periureteral stranding. GI AND BOWEL: Stomach demonstrates no acute abnormality. Decompressed stomach and duodenum. Moderate sigmoid diverticulosis, no active inflammation. Relatively mild retained stool in the large bowel. Normal appendix on series 3 image 56. Indistinct abnormal soft tissue thickening and inflammation at the ileocecal valve on series 3 image 43. Surrounding patchy mesenteric stranding. This is a short segment abnormality, encompassing about 3 cm of both the distal terminal ileum and cecum. There is a nodular soft tissue configuration (coronal image 79). Small regional mesenteric lymph nodes in addition to the mild mesenteric stranding (coronal image 71). Acute mass versus inflammation at the junction of the terminal ileum and cecum (IC valve) on this noncontrast exam. Mild to moderate regional mesenteric inflammation. Upstream small bowel loops are nondilated and appear normal. There is no bowel obstruction. No pneumoperitoneum or free fluid. PERITONEUM AND RETROPERITONEUM: No ascites. No free air. VASCULATURE: Aorta is normal in caliber. Advanced calcified atherosclerosis including aortoiliac involvement. LYMPH NODES: Small regional mesenteric lymph nodes in addition to the mild mesenteric stranding (coronal image 71). REPRODUCTIVE ORGANS: Chronic prostate therapy and or surgical clips stable. No pelvis recluse. BONES AND SOFT TISSUES: Diffuse osteopenia. Hyperostosis in the spine with intermittent levels of spinal ankylosis. Chronic lower rib fractures. Small chronic fat containing umbilical hernia. No herniated bowel. No focal soft tissue abnormality. IMPRESSION: 1. Acute Mass versus Inflammation at the junction of the terminal ileum and cecum (IC valve, series 3 image 53) on this noncontrast exam. Mild regional mesenteric inflammation. No  regional lymphadenopathy. Colonoscopy may be necessary to exclude Tumor. 2. No other acute finding. Chronic left renal atrophy with indwelling left double-J stent. Moderate sigmoid diverticulosis without evidence of diverticulitis. Advanced aortic calcified atherosclerosis. Electronically signed by: Helayne Hurst MD 09/06/2024 05:02 AM EDT RP Workstation:  [id number]   ECHOCARDIOGRAM COMPLETE Result Date: 08/23/2024    ECHOCARDIOGRAM REPORT   Patient Name:   SUNNY GAINS Date of Exam: 08/23/2024 Medical Rec #:  [id number]       Height:       71.0 in Accession #:    [id number]      Weight:       265.2 lb Date of Birth:  1934-04-27       BSA:          2.377 m Patient Age:    89 years        BP:           112/76 mmHg Patient Gender: M               HR:           56 bpm. Exam Location:  [address] Procedure: 2D Echo, 3D Echo and Strain Analysis (Both Spectral and Color Flow            Doppler were utilized during procedure). Indications:    I35.0 Nonrheumatic aortic (valve) stenosis  History:        Patient has prior history of Echocardiogram examinations, most                 recent 08/23/2023. Risk Factors:Hypertension, Diabetes and                 Dyslipidemia. Congestive dilated cardiomyopathy. Atherosclerosis                 of aorta. Bilateral lower extremity edema. Obesity.  Sonographer:    Jon Hacker RCS Referring Phys: [id number] MARK C SKAINS IMPRESSIONS  1. Left ventricular ejection fraction, by estimation, is 55 to 60%. Left ventricular ejection fraction by 3D volume is 63 %. The left ventricle has normal function. The left ventricle has no regional wall motion abnormalities. There is mild concentric left ventricular hypertrophy. The average left ventricular global longitudinal strain is -23.5 %. The global longitudinal strain is normal.  2. RVSP estimated at 39 mmHg about RAP.SABRA Right ventricular systolic function is normal. The right ventricular size is normal.  3. Left atrial size was moderately dilated.   4. The mitral valve is normal in structure. Mild mitral valve regurgitation. No evidence of mitral stenosis. Severe mitral annular calcification.  5. Tricuspid valve regurgitation is mild to moderate.  6. There is severe aortic calcification with associated moderate to severe paradoxical low flow low gradient AS w/ PV 2.6 m/sec, MG 15 mmHg, AVA 1.00 cm^2 (by continuity), iAVA 0.36 cm^2/m^2 and DVI 0.25. There is also mild AI. SABRA The aortic valve is normal in structure. There is severe calcifcation of the aortic valve. Aortic valve regurgitation is mild. No aortic stenosis is present.  7. There is mild dilatation of the ascending aorta, measuring 41 mm.  8. The inferior vena cava is normal in size with greater than 50% respiratory variability, suggesting right atrial pressure of 3 mmHg. Conclusion(s)/Recommendation(s): Compared to prior echo, EF is stable. AS gradient and peak velocity similar but AVA suggesting progression to severe AS. FINDINGS  Left Ventricle: Left ventricular ejection fraction, by estimation, is 55 to 60%. Left ventricular ejection fraction by 3D volume is 63 %. The left ventricle has normal function. The left ventricle has no regional wall motion abnormalities. The average left ventricular global longitudinal strain is -23.5 %. Strain was performed and the global longitudinal strain is normal. The left ventricular internal cavity size was normal in  size. There is mild concentric left ventricular hypertrophy. Left ventricular diastolic function could not be evaluated due to mitral annular calcification (moderate or greater). Right Ventricle: RVSP estimated at 39 mmHg about RAP. The right ventricular size is normal. No increase in right ventricular wall thickness. Right ventricular systolic function is normal. Left Atrium: Left atrial size was moderately dilated. Right Atrium: Right atrial size was normal in size. Pericardium: There is no evidence of pericardial effusion. Mitral Valve: The mitral  valve is normal in structure. Severe mitral annular calcification. Mild mitral valve regurgitation. No evidence of mitral valve stenosis. Tricuspid Valve: The tricuspid valve is normal in structure. Tricuspid valve regurgitation is mild to moderate. No evidence of tricuspid stenosis. Aortic Valve: There is severe aortic calcification with associated moderate to severe paradoxical low flow low gradient AS w/ PV 2.6 m/sec, MG 15 mmHg, AVA 1.00 cm^2 (by continuity), iAVA 0.36 cm^2/m^2 and DVI 0.25. There is also mild AI. The aortic valve is normal in structure. There is severe calcifcation of the aortic valve. Aortic valve regurgitation is mild. No aortic stenosis is present. Aortic valve mean gradient measures 15.0 mmHg. Aortic valve peak gradient measures 27.7 mmHg. Aortic valve area, by VTI measures 1.01 cm. Pulmonic Valve: The pulmonic valve was normal in structure. Pulmonic valve regurgitation is mild. No evidence of pulmonic stenosis. Aorta: The aortic root is normal in size and structure. There is mild dilatation of the ascending aorta, measuring 41 mm. Venous: The inferior vena cava is normal in size with greater than 50% respiratory variability, suggesting right atrial pressure of 3 mmHg. IAS/Shunts: No atrial level shunt detected by color flow Doppler. Additional Comments: 3D was performed not requiring image post processing on an independent workstation and was normal.  LEFT VENTRICLE PLAX 2D LVIDd:         5.10 cm         Diastology LVIDs:         3.16 cm         LV e' medial:    6.74 cm/s LV PW:         1.25 cm         LV E/e' medial:  22.8 LV IVS:        1.41 cm         LV e' lateral:   7.51 cm/s LVOT diam:     2.00 cm         LV E/e' lateral: 20.5 LV SV:         69 LV SV Index:   29              2D Longitudinal LVOT Area:     3.14 cm        Strain                                2D Strain GLS   -24.4 %                                (A4C):                                2D Strain GLS   -22.3 %                                 (  A3C):                                2D Strain GLS   -23.8 %                                (A2C):                                2D Strain GLS   -23.5 %                                Avg:                                 3D Volume EF                                LV 3D EF:    Left                                             ventricul                                             ar                                             ejection                                             fraction                                             by 3D                                             volume is                                             63 %.                                 3D Volume EF:                                3D EF:  63 %                                LV EDV:       169 ml                                LV ESV:       63 ml                                LV SV:        106 ml RIGHT VENTRICLE RV S prime:     11.00 cm/s TAPSE (M-mode): 2.3 cm RVSP:           38.8 mmHg LEFT ATRIUM              Index        RIGHT ATRIUM           Index LA diam:        5.60 cm  2.36 cm/m   RA Pressure: 3.00 mmHg LA Vol (A2C):   98.7 ml  41.52 ml/m  RA Area:     16.60 cm LA Vol (A4C):   130.0 ml 54.69 ml/m  RA Volume:   34.30 ml  14.43 ml/m LA Biplane Vol: 113.0 ml 47.53 ml/m  AORTIC VALVE AV Area (Vmax):    0.97 cm AV Area (Vmean):   1.00 cm AV Area (VTI):     1.01 cm AV Vmax:           263.00 cm/s AV Vmean:          170.000 cm/s AV VTI:            0.681 m AV Peak Grad:      27.7 mmHg AV Mean Grad:      15.0 mmHg LVOT Vmax:         80.90 cm/s LVOT Vmean:        54.200 cm/s LVOT VTI:          0.219 m LVOT/AV VTI ratio: 0.32  AORTA Ao Root diam: 4.00 cm Ao Asc diam:  4.20 cm MITRAL VALVE                TRICUSPID VALVE MV Area (PHT): 4.06 cm     TR Peak grad:   35.8 mmHg MV Decel Time: 187 msec     TR Vmax:        299.00 cm/s MV E velocity: 154.00 cm/s  Estimated RAP:  3.00 mmHg MV A velocity: 104.00  cm/s  RVSP:           38.8 mmHg MV E/A ratio:  1.48                             SHUNTS                             Systemic VTI:  0.22 m                             Systemic Diam: 2.00 cm Joelle Azobou Tonleu Electronically signed by Joelle Cedars Tonleu Signature Date/Time: 08/23/2024/1:19:41 PM    Final  Assessment & Plan Cecal cancer, 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 No metastatic disease on CT scan. Early stage, likely requiring surgical intervention. Discussed surgical removal of the right side colon as the standard of care.  Will refer him to colorectal surgeon.  Due to his advanced age, medical comorbidities, he is little reluctant to consider surgery which is totally understandable.  I will check MMR, we discussed the option of immunotherapy if tumor is MSI high, and he may not need surgery if he has complete response from immunotherapy. - Referred to colorectal surgeon for evaluation and potential surgery - Ordered MMR testing to assess for MSI high status - Communicated with cardiologist to ensure cardiac clearance for surgery - Coordinated with Doctor Nicholaus for ongoing prostate cancer management  Iron deficiency anemia due to chronic blood loss Iron deficiency anemia likely secondary to chronic blood loss from cecal cancer. Previous IV iron infusions and blood transfusions. Current hemoglobin level is low, requiring further intervention. Discussed potential for IV iron infusion if iron levels are low, with blood transfusion as an alternative if hemoglobin is critically low. He prefers to receive treatment at [facility] for convenience. 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 - Very well-controlled, recent PSA 0.03. - He prefers to continue follow-up with [name] at [facility].  Aortic stenosis, hypertension, diabetes, stage III chronic kidney disease -He will follow-up with his cardiologist and PCP - He probably will need cardiac clearance before colon surgery  Plan - Lab today, to see if he needs a blood transfusion or IV iron - Will refer him to colorectal surgeon at [facility] -plan to see him back after surgery or sooner if he requires frequent iv iron or blood transfusions - Will communicate with his cardiologist and oncologist [name] at [facility]  Orders Placed This Encounter  Procedures   CBC with Differential/Platelet    Standing Status:   Standing    Number of Occurrences:   50    Expiration Date:   09/17/2025   CEA (Access)    Standing Status:   Standing    Number of Occurrences:   30    Expiration Date:   09/17/2025   Comprehensive metabolic panel with GFR    Standing Status:   Standing    Number of Occurrences:   50    Expiration Date:   09/17/2025   Ferritin    Standing Status:   Standing    Number of Occurrences:   20    Expiration Date:   09/17/2025   Vitamin B12    Standing Status:   Future    Expected Date:   09/17/2024    Expiration Date:   09/17/2025   Iron and TIBC    Standing Status:   Future    Expected Date:   09/17/2024    Expiration Date:   09/17/2025   Ambulatory referral to General Surgery    Referral Priority:   Urgent    Referral Type:   Surgical    Referral Reason:   Specialty Services Required    Requested Specialty:  General Surgery    Number of Visits Requested:   1    All questions were answered. The patient knows to call the clinic with any problems, questions or concerns. I spent 45 minutes counseling the patient face to face. The total time spent in the appointment was 60 minutes including  review of chart and various tests results, discussions about plan of care and coordination of care plan.     Onita Mattock, MD 09/17/2024 3:25 PM

## 2024-09-18 LAB — CEA (ACCESS): CEA (CHCC): 3.93 ng/mL (ref 0.00–5.00)

## 2024-09-19 ENCOUNTER — Other Ambulatory Visit: Payer: Self-pay

## 2024-09-19 ENCOUNTER — Telehealth: Payer: Self-pay | Admitting: Genetic Counselor

## 2024-09-19 NOTE — Progress Notes (Addendum)
 PATIENT NAVIGATOR PROGRESS NOTE  Name: Jeremiah Collins Date: 09/19/2024 MRN: [id number]  DOB: 12/18/1933   Called patient for follow-up regarding GI Multidisciplinary clinic recommendations.  Genetics referral was recommended and patient was informed of this recommendation.  Patient has declined genetics referral at this time.  Dr. Lanny made aware of patients response. Follow-up message was sent to [facility] office to check on status of patient's referral. Will follow-up to ensure patient has been scheduled.   Time spent counseling/coordinating care: 45-60 minutes

## 2024-09-19 NOTE — Telephone Encounter (Addendum)
 Patient was on the GI tumor board.  Based on his personal history of metastatic prostate cancer he meets genetic testing criteria.  They will discuss with the patient and refer if interested.  Addendum:  Patient declined.  Darice Monte, MS, CGC  Licensed, Patent Attorney [email or web address] phone: [phone]

## 2024-09-19 NOTE — Progress Notes (Signed)
 The proposed treatment discussed in conference is for discussion purpose only and is not a binding recommendation.  The patients have not been physically examined, or presented with their treatment options.  Therefore, final treatment plans cannot be decided.

## 2024-09-20 LAB — SURGICAL PATHOLOGY

## 2024-09-23 ENCOUNTER — Ambulatory Visit: Payer: Self-pay | Admitting: Hematology

## 2024-09-24 ENCOUNTER — Telehealth: Payer: Self-pay

## 2024-09-24 DIAGNOSIS — R300 Dysuria: Secondary | ICD-10-CM

## 2024-09-24 NOTE — Telephone Encounter (Signed)
 Per [name], NP patient needs an appointment. I shared response with Erminio, she stated patient is scheduled to go to [facility] tomorrow, and she will see if that doctor will address concerns  or they may decided to go to urgent care

## 2024-09-24 NOTE — Telephone Encounter (Signed)
 Patients wife Erminio walked into office stating that she just got off the phone with someone here explaining that patient thinks he has a urinary tract infection. Patient with pressure and burning when urinating x couple of days.  Patient is not present  Erminio states she would like to pick up supplies for a clean catch  Please advise

## 2024-09-24 NOTE — Telephone Encounter (Signed)
 Okay he was recently hospitalized as well and needs a TOC appt/follow up

## 2024-09-24 NOTE — Progress Notes (Signed)
 PATIENT NAVIGATOR PROGRESS NOTE  Name: Jeremiah Collins Date: 09/24/2024 MRN: [id number]  DOB: Sep 07, 1934   Called patient to inform him of his lab results from 11/3 and Dr. Demetra recommendations (see telephone note).  Patient verbalized understanding.    Time spent counseling/coordinating care: 15-30 minutes

## 2024-09-24 NOTE — Telephone Encounter (Signed)
 Spoke with patient, patient states he has a lot going on, patient is planning to being in the hospital a couple more times, and will keep appointment already scheduled for December and follow-up us  if needed.

## 2024-10-02 ENCOUNTER — Telehealth (HOSPITAL_BASED_OUTPATIENT_CLINIC_OR_DEPARTMENT_OTHER): Payer: Self-pay

## 2024-10-02 NOTE — Telephone Encounter (Signed)
 Dr. Wendel Pt was in the process for evaluation for TAVR, but now undergoing workup for colonic mass. We are asked for preoperative risk stratification prior to colon surgery.   Do you recommend he proceed with caution for decompensation during surgery?

## 2024-10-02 NOTE — Progress Notes (Signed)
 REFERRING PHYSICIAN:  Lanny Callander, MD  PROVIDER:  BERNARDA WANDA NED, MD  MRN: [id number] DOB: 06/05/1934 DATE OF ENCOUNTER: 10/02/2024  Subjective   Chief Complaint: NEW CANCER      History of Present Illness: Jeremiah Collins is a 89 y.o. male who is seen today as an office consultation at the request of Dr. Lanny for evaluation of NEW CANCER  .   Patient underwent a colonoscopy during her recent hospital stay due to hematochezia, iron deficiency anemia and abnormal CT scan.  There was a tumor noted in the cecum.  Biopsy showed invasive moderately differentiated colon adenocarcinoma.  CT scan of the chest abdomen pelvis showed a mass in the cecum with regional mesenteric inflammation.  There was no other signs of metastatic disease.  CEA was normal.  He has a history of metastatic prostate cancer currently well-controlled.  He has a aortic valve stenosis and was recommended to have intervention.  He also has stage III kidney disease.  He has a double-J stent for a ureteral stricture.  He states that this is causing him quite a bit of pain and is waiting for exchange in the next few weeks.  He sees a urologist at [facility] for this.  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   Review of Systems: A complete review of systems was obtained from the patient.  I have reviewed this information and discussed as appropriate with the patient.  See HPI as well for other ROS.     Medical History: Past Medical History:  Diagnosis Date  . Arthritis   . Heart valve disease   . History of cancer   . Hyperlipidemia     There is no problem list on file for this patient.   Past Surgical History:  Procedure Laterality Date  . .eye surgery    . .stents    . Leg surgery    . PROSTATE SURGERY       Allergies  Allergen Reactions  . Abiraterone Nausea And Vomiting, Other (See Comments) and Palpitations    Other reaction(s):  Increased Heart Rate (intolerance)    Current Outpatient Medications on File Prior to Visit  Medication Sig Dispense Refill  . acetaminophen  (TYLENOL ) 500 MG tablet Take 1,000 mg by mouth    . allopurinoL  (ZYLOPRIM ) 100 MG tablet Take 200 mg by mouth once daily    . calcium  carbonate (TUMS) 200 mg calcium  (500 mg) chewable tablet Take 500 mg by mouth at bedtime as needed    . cholecalciferol  (VITAMIN D3) 1000 unit tablet Take 1,000 Units by mouth once daily    . darolutamide (NUBEQA) 300 mg tablet Take 300 mg by mouth    . doxazosin  (CARDURA ) 4 MG tablet Take 4 mg by mouth once daily    . [START ON 10/03/2024] ferrous sulfate  325 (65 FE) MG tablet Take 325 mg by mouth    . leuprolide  acetate, 6 month, (ELIGARD ) 45 mg injection Inject 45 mg subcutaneously every 6 (six) months    . metoprolol  TARTrate (LOPRESSOR ) 25 MG tablet Take 25 mg by mouth 2 (two) times daily    . pyridoxine, vitamin B6, (B-6) 100 MG tablet Take 100 mg by mouth once daily    . rosuvastatin  (CRESTOR ) 5 MG tablet Take 5 mg by mouth once daily     No current facility-administered medications on file prior to visit.    Family History  Problem  Relation Age of Onset  . High blood pressure (Hypertension) Mother      Social History   Tobacco Use  Smoking Status Former  . Types: Cigarettes  . Start date: 57  Smokeless Tobacco Never     Social History   Socioeconomic History  . Marital status: Married  Tobacco Use  . Smoking status: Former    Types: Cigarettes    Start date: 29  . Smokeless tobacco: Never  Substance and Sexual Activity  . Alcohol use: Never  . Drug use: Never   Social Drivers of Corporate Investment Banker Strain: Low Risk  (09/15/2018)   Received from [facility]   Overall Financial Resource Strain (CARDIA)   . Difficulty of Paying Living Expenses: Not hard at all  Food Insecurity: No Food Insecurity (09/17/2024)   Received from [facility]   Hunger Vital Sign   . Within the past  12 months, you worried that your food would run out before you got the money to buy more.: Never true   . Within the past 12 months, the food you bought just didn't last and you didn't have money to get more.: Never true  Transportation Needs: No Transportation Needs (09/17/2024)   Received from [facility] - Transportation   . In the past 12 months, has lack of transportation kept you from medical appointments or from getting medications?: No   . In the past 12 months, has lack of transportation kept you from meetings, work, or from getting things needed for daily living?: No  Physical Activity: Inactive (09/15/2018)   Received from [facility]   Exercise Vital Sign   . Days of Exercise per Week: 0 days   . Minutes of Exercise per Session: 0 min  Stress: No Stress Concern Present (09/15/2018)   Received from [facility] - Occupational Stress Questionnaire   . Feeling of Stress : Not at all  Social Connections: Moderately Integrated (09/06/2024)   Received from [facility]   Social Connection and Isolation Panel   . In a typical week, how many times do you talk on the phone with family, friends, or neighbors?: More than three times a week   . How often do you get together with friends or relatives?: Once a week   . How often do you attend church or religious services?: Never   . Do you belong to any clubs or organizations such as church groups, unions, fraternal or athletic groups, or school groups?: Yes   . How often do you attend meetings of the clubs or organizations you belong to?: 1 to 4 times per year   . Are you married, widowed, divorced, separated, never married, or living with a partner?: Married  Housing Stability: Unknown (10/02/2024)   Housing Stability Vital Sign   . Homeless in the Last Year: No    Objective:    Vitals:   10/02/24 0940 10/02/24 0941  BP: 120/70   Pulse: 69   Temp: 36.5 C (97.7 F)   SpO2: 98%    Weight: (!) 117.5 kg (259 lb)   Height: 180.3 cm (5' 11)   PainSc:  0-No pain     Exam Gen: NAD Abd: soft    Labs, Imaging and Diagnostic Testing: CEA 3.9  Assessment and Plan:  Colon cancer, ascending (CMS/HHS-HCC)  (primary encounter diagnosis)   89 year old male with aortic valve stenosis, stage III kidney disease and metastatic prostate cancer.  He likely has an early colon cancer.  We discussed surgical resection today.  He does have quite a few comorbidities which might limit his ability to tolerate surgery.  We will follow-up with his cardiologist for risk assessment before making any further plans for surgery.  He would like to get his stent exchanged, as it is causing quite a bit of pain for him.  This is set up for December 3.  Bernarda JAYSON Ned, MD Colon and Rectal Surgery [facility]

## 2024-10-02 NOTE — Telephone Encounter (Signed)
   Pre-operative Risk Assessment    Patient Name: Jeremiah Collins  DOB: [birth year] MRN: [id number]   Date of last office visit: 09/05/24 with Dr. Wendel  Date of next office visit: 10/04/24 with Dr. Jeffrie   Request for Surgical Clearance    Procedure:  colon surgery  Date of Surgery:  Clearance TBD                                 Surgeon:  Dr. Debby Socks Group or Practice Name:  [facility] Phone number:  [phone] Fax number:  [phone]   Type of Clearance Requested:   - Medical    Type of Anesthesia:  General    Additional requests/questions:    SignedAugustin JONETTA Daring   10/02/2024, 1:17 PM

## 2024-10-03 NOTE — Telephone Encounter (Signed)
 Pt has appt today with DR. [employer]

## 2024-10-03 NOTE — Telephone Encounter (Signed)
   Name: Jeremiah Collins  DOB: 07/06/1934  MRN: [id number]  Primary Cardiologist: Oneil Parchment, MD  Chart reviewed as part of pre-operative protocol coverage. The patient has an upcoming visit scheduled with Dr. Parchment on 10/04/2024 at which time clearance can be addressed in case there are any issues that would impact surgical recommendations.  Per Dr. Wendel 10/02/2024      Mr Fitzmaurice would need TAVR first prior to colonic resection surgery.  Not sure if TAVR then colon surgery for adenocarcinoma in this 89year old who apparently was in a wheelchair at Dr. Demetra appointment.  Should we think about palliation here?   Thanks, Arun  Colon surgery Is not scheduled until TBD as below. I added preop FYI to appointment note so that provider is aware to address at time    I will route this message as FYI to requesting party and remove this message from the preop box as separate preop APP input not needed at this time.   Please call with any questions.  Lamarr Satterfield, NP  10/03/2024, 10:06 AM

## 2024-10-04 ENCOUNTER — Encounter: Payer: Self-pay | Admitting: Cardiology

## 2024-10-04 ENCOUNTER — Ambulatory Visit: Attending: Cardiology | Admitting: Cardiology

## 2024-10-04 VITALS — BP 115/63 | HR 61 | Ht 71.0 in | Wt 265.0 lb

## 2024-10-04 DIAGNOSIS — I350 Nonrheumatic aortic (valve) stenosis: Secondary | ICD-10-CM | POA: Diagnosis not present

## 2024-10-04 DIAGNOSIS — C180 Malignant neoplasm of cecum: Secondary | ICD-10-CM

## 2024-10-04 DIAGNOSIS — C61 Malignant neoplasm of prostate: Secondary | ICD-10-CM

## 2024-10-04 NOTE — Patient Instructions (Addendum)
 Medication Instructions:  Please discontinue your Metoprolol . Continue all other medications as listed.  *If you need a refill on your cardiac medications before your next appointment, please call your pharmacy*  You have been referred to [facility] and will be contacted to be scheduled for an appointment. Address: [address], [place], [place] [address] Phone: [phone]  Follow-Up: At [facility], you and your health needs are our priority.  As part of our continuing mission to provide you with exceptional heart care, our providers are all part of one team.  This team includes your primary Cardiologist (physician) and Advanced Practice Providers or APPs (Physician Assistants and Nurse Practitioners) who all work together to provide you with the care you need, when you need it.  Your next appointment:   Follow up as needed with Dr Jeffrie   We recommend signing up for the patient portal called MyChart.  Sign up information is provided on this After Visit Summary.  MyChart is used to connect with patients for Virtual Visits (Telemedicine).  Patients are able to view lab/test results, encounter notes, upcoming appointments, etc.  Non-urgent messages can be sent to your provider as well.   To learn more about what you can do with MyChart, go to [email or web address].

## 2024-10-04 NOTE — Progress Notes (Signed)
 Cardiology Office Note:  .   Date:  10/04/2024  ID:  Jeremiah Collins, DOB 05/16/1934, MRN [id number] PCP: [name], NP  [facility] Providers Cardiologist:  Oneil Parchment, MD     History of Present Illness: Jeremiah Collins is a 89 y.o. male Discussed the use of AI scribe   History of Present Illness Jeremiah Collins is an 89 year old male with severe aortic stenosis who presents for evaluation of surgical risks in the setting of recently discovered adenocarcinoma of the cecum, metastatic prostate cancer. He is accompanied by Jeremiah Collins.   He has severe aortic stenosis with a dimensionless index of 0.25 and an aortic valve area of 0.36 cm, indexed. An echocardiogram on 08/23/24 showed an ejection fraction of 60% with severe aortic stenosis, paradoxical low flow, and low gradient. No current symptoms of shortness of breath or chest pain are present while at rest.  He has been diagnosed with colon cancer, specifically an adenocarcinoma located in the cecum, with regional mesenteric inflammation noted on CT scan. A colonoscopy due to bleeding and iron deficiency anemia led to this diagnosis. There are no signs of metastatic disease, and his CEA levels are normal. He reports no current pain from the colon cancer.  He has a history of metastatic prostate cancer, which is currently well controlled with medication, and his PSA is less than one.  He has stage three chronic kidney disease and a history of urinary issues, including a ureteral stent placed to address a stricture. He reports significant discomfort due to pressure in his bladder, which he attributes to the overdue stent change scheduled for October 17, 2024. He has attempted self-catheterization to relieve bladder pressure but was unsuccessful.  His current medications include rosuvastatin  5 mg, which was reduced from 10 mg, and metoprolol  25 mg twice a day. He expresses concern about his blood pressure, which has been low,  and notes that it used to be higher.       Studies Reviewed: .        Results LABS CEA: Normal Hb: 7.3  RADIOLOGY CT scan chest, abdomen, and pelvis: Mass in cecum with regional mesenteric inflammation, no other signs of metastatic disease  DIAGNOSTIC Echocardiogram: Ejection fraction 60%, severe aortic stenosis, paradoxical low flow, low gradient, dimensionless index 0.25, aortic valve area 0.36 indexed (08/23/2024) Colonoscopy: Tumor in cecum, biopsy showed adenocarcinoma Risk Assessment/Calculations:            Physical Exam:   VS:  BP 115/63   Pulse 61   Ht 5' 11 (1.803 m)   Wt 265 lb (120.2 kg)   SpO2 98%   BMI 36.96 kg/m    Wt Readings from Last 3 Encounters:  10/04/24 265 lb (120.2 kg)  09/17/24 261 lb 8 oz (118.6 kg)  09/09/24 264 lb 1.8 oz (119.8 kg)    GEN: Well nourished, well developed in no acute distress, in wheelchair NECK: No JVD; No carotid bruits CARDIAC: RRR, 3/6 systolic murmur, no rubs, no gallops RESPIRATORY:  Clear to auscultation without rales, wheezing or rhonchi  ABDOMEN: Soft, non-tender, non-distended EXTREMITIES: Moderate lower extremity edema; No deformity   ASSESSMENT AND PLAN: .    Assessment and Plan Assessment & Plan Severe aortic stenosis with low flow, low gradient Ejection fraction of 60% and aortic valve area of 0.36 cm2. High surgical risk due to comorbidities including cecal adenocarcinoma, metastatic prostate cancer, and chronic kidney disease.  Had lengthy discussion with he and  his wife Jeremiah Collins.  We will not pursue further workup for TAVR valve.  He is at extremely high risk for multiple complications from procedure especially in light of recent significant anemia requiring transfusion from colonic lesion.  Palliative care approach and ultimately hospice discussed to manage symptoms and avoid surgical intervention. - Stopped metoprolol  25 mg twice a day due to relative hypotension - Consulted palliative care team for  symptom management and support.   Cecal adenocarcinoma Cecal adenocarcinoma with regional mesenteric inflammation.  He would not be a candidate for general anesthesia/surgery given his severe aortic stenosis. - Consulted palliative care team for symptom management and support  Metastatic prostate cancer, currently well controlled Metastatic prostate cancer under control with medication. PSA levels are less than 1, indicating good control.  Chronic kidney disease stage 3 Chronic kidney disease stage 3, contributing to overall surgical risk.  Iron deficiency anemia secondary to gastrointestinal blood loss Iron deficiency anemia secondary to gastrointestinal blood loss, likely related to cecal adenocarcinoma. Recent hemoglobin level was 7.3, indicating significant anemia. - Consulted palliative care team for symptom management and support  In summary, I have alerted the structural heart team, TAVR team, that we will not pursue aortic valve replacement.  I recommend continued palliation and will consult the palliative care team for further support.  Lengthy discussion of over 40 minutes took place.           Signed, Oneil Parchment, MD

## 2024-10-17 LAB — BASIC METABOLIC PANEL WITH GFR
BUN: 26 — AB (ref 4–21)
CO2: 21 (ref 13–22)
Chloride: 109 — AB (ref 99–108)
Creatinine: 1.5 — AB (ref 0.6–1.3)
Glucose: 122
Potassium: 4.7 meq/L (ref 3.5–5.1)
Sodium: 141 (ref 137–147)

## 2024-10-17 LAB — COMPREHENSIVE METABOLIC PANEL WITH GFR
Calcium: 8.5 — AB (ref 8.7–10.7)
eGFR: 45

## 2024-10-24 NOTE — Patient Instructions (Signed)
 Please visit your local pharmacy to receive your shingles and covid vaccine, if you have not already received.

## 2024-10-25 ENCOUNTER — Ambulatory Visit: Admitting: Podiatry

## 2024-10-25 ENCOUNTER — Encounter: Payer: Self-pay | Admitting: Podiatry

## 2024-10-25 DIAGNOSIS — M79675 Pain in left toe(s): Secondary | ICD-10-CM

## 2024-10-25 DIAGNOSIS — B351 Tinea unguium: Secondary | ICD-10-CM | POA: Diagnosis not present

## 2024-10-25 DIAGNOSIS — M79674 Pain in right toe(s): Secondary | ICD-10-CM | POA: Diagnosis not present

## 2024-10-25 DIAGNOSIS — E0843 Diabetes mellitus due to underlying condition with diabetic autonomic (poly)neuropathy: Secondary | ICD-10-CM | POA: Diagnosis not present

## 2024-10-25 NOTE — Progress Notes (Signed)
 This patient returns to my office for at risk foot care.  This patient requires this care by a professional since this patient will be at risk due to having diabetes with neuropathy.  This patient is unable to cut nails himself since the patient cannot reach his nails.These nails are painful walking and wearing shoes.  This patient presents for at risk foot care today.  General Appearance  Alert, conversant and in no acute stress.  Vascular  Dorsalis pedis and posterior tibial  pulses are  weakly palpable  bilaterally.  Capillary return is within normal limits  bilaterally. Temperature is within normal limits  bilaterally.  Neurologic  Senn-Weinstein monofilament wire test within normal limits  bilaterally. Muscle power within normal limits bilaterally.  Nails Thick disfigured discolored nails with subungual debris  from hallux to fifth toes bilaterally. No evidence of bacterial infection or drainage bilaterally.  Orthopedic  No limitations of motion  feet .  No crepitus or effusions noted.  No bony pathology or digital deformities noted.  Skin  normotropic skin with no porokeratosis noted bilaterally.  No signs of infections or ulcers noted.     Onychomycosis  Pain in right toes  Pain in left toes  Consent was obtained for treatment procedures.   Mechanical debridement of nails 1-5  bilaterally performed with a nail nipper.  Filed with dremel without incident.    Return office visit    3 months                 Told patient to return for periodic foot care and evaluation due to potential at risk complications.   Helane Gunther DPM

## 2024-10-26 ENCOUNTER — Encounter: Payer: Self-pay | Admitting: Nurse Practitioner

## 2024-10-26 ENCOUNTER — Ambulatory Visit: Payer: Self-pay | Admitting: Nurse Practitioner

## 2024-10-26 VITALS — BP 124/78 | HR 87 | Temp 97.3°F | Resp 18 | Ht 71.0 in | Wt 255.6 lb

## 2024-10-26 DIAGNOSIS — C61 Malignant neoplasm of prostate: Secondary | ICD-10-CM | POA: Diagnosis not present

## 2024-10-26 DIAGNOSIS — K6389 Other specified diseases of intestine: Secondary | ICD-10-CM

## 2024-10-26 DIAGNOSIS — I350 Nonrheumatic aortic (valve) stenosis: Secondary | ICD-10-CM | POA: Diagnosis not present

## 2024-10-26 DIAGNOSIS — N1832 Chronic kidney disease, stage 3b: Secondary | ICD-10-CM

## 2024-10-26 DIAGNOSIS — D649 Anemia, unspecified: Secondary | ICD-10-CM | POA: Diagnosis not present

## 2024-10-26 DIAGNOSIS — I420 Dilated cardiomyopathy: Secondary | ICD-10-CM

## 2024-10-26 DIAGNOSIS — C7951 Secondary malignant neoplasm of bone: Secondary | ICD-10-CM | POA: Diagnosis not present

## 2024-10-26 DIAGNOSIS — E782 Mixed hyperlipidemia: Secondary | ICD-10-CM | POA: Diagnosis not present

## 2024-10-26 DIAGNOSIS — C7952 Secondary malignant neoplasm of bone marrow: Secondary | ICD-10-CM | POA: Diagnosis not present

## 2024-10-26 DIAGNOSIS — E1142 Type 2 diabetes mellitus with diabetic polyneuropathy: Secondary | ICD-10-CM

## 2024-10-26 NOTE — Progress Notes (Signed)
 Careteam: Patient Care Team: [name], NP as PCP - General (Geriatric Medicine) Jeffrie Oneil BROCKS, MD as PCP - Cardiology (Cardiology) Nicholaus Tanda LITTIE DOUGLAS, MD as Attending Physician (Urology) Robinson Idol, MD as Consulting Physician (Ophthalmology) Ardis, Evalene LITTIE, RN as Oncology Nurse Navigator  PLACE OF SERVICE:  [facility]  Advanced Directive information    Allergies[1]  Chief Complaint  Patient presents with   Medical Management of Chronic Issues    Six Months Follow-up    HPI:  Discussed the use of AI scribe software for clinical note transcription with the patient, who gave verbal consent to proceed.  History of Present Illness Jeremiah Collins is an 89 year old male with severe aortic stenosis and colon cancer who presents for follow-up after recent hospitalization for a GI bleed.  He has severe aortic stenosis with a leaking aortic valve, significantly impacting his mobility. He experiences shortness of breath and fatigue upon exertion, such as walking to his car.  He was hospitalized from October 23rd to October 26th for a gastrointestinal bleed, which was found to be due to a malignant tumor in the cecum, diagnosed as colon cancer. A colonoscopy revealed a malignant tumor in the cecum and diverticulitis in the ascending colon. A CT scan of the chest, abdomen, and pelvis showed no other malignancies. He has been referred to a colorectal surgeon for potential surgical removal of the tumor, but cardiology has not cleared him due to severe stenosis of the aortic valve  He has a history of iron deficiency anemia secondary to chronic blood loss from the colon cancer, contributing to his fatigue. He recently received an iron infusion but reports no significant improvement in his energy levels. His hemoglobin was 8.2 prior to the infusion.  He has a history of prostate cancer, which is currently under control with medication, and chronic kidney disease, which  remains stable. He undergoes regular stent exchanges for ureteral obstruction on the left side, with the most recent exchange occurring approximately two weeks ago. The stent was in place for six months, longer than the usual four months, which caused some issues.  He experiences swelling in his feet and ankles, which he attributes to fluid retention. He was previously on a diuretic, but it was discontinued due to low blood pressure. His socks leave a ring around his ankles.   No current pain with urination and a previous burning sensation has resolved. He also reports chronic bowel leakage without pressure buildup, ongoing for ten years.    Review of Systems:  Review of Systems  Constitutional:  Negative for chills, fever and weight loss.  HENT:  Negative for tinnitus.   Respiratory:  Positive for shortness of breath (with exertion). Negative for cough and sputum production.   Cardiovascular:  Positive for leg swelling. Negative for chest pain and palpitations.  Gastrointestinal:  Negative for abdominal pain, constipation, diarrhea and heartburn.  Genitourinary:  Negative for dysuria, frequency and urgency.  Musculoskeletal:  Negative for back pain, falls, joint pain and myalgias.  Skin: Negative.   Neurological:  Negative for dizziness and headaches.  Psychiatric/Behavioral:  Negative for depression and memory loss. The patient does not have insomnia.     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/28/2006   Bone metastases    left mid tibial shaft   Cataract    Dr.Groat   Cellulitis and abscess of leg, except foot 01/17/2012   Closed fracture of  five ribs 03/24/2004   First degree atrioventricular block 04/25/2007   History of radiation therapy 04/03/14- 04/17/14   mid to distal left tibia 3000 cGy in 10 sessions   HTN (hypertension)    Hx of radiation therapy 11/15/1998   prostate fossa - 6040 cGy, 33 fx, Dr Johney    Osteoarthrosis involving, or with mention of more than one site, but not specified as generalized, multiple sites 10/22/2010   Other abnormal blood chemistry 04/29/1991   Other and unspecified hyperlipidemia 01/02/2013   Prostate cancer (HCC) 12/16/1994   gleason 7   Reflux esophagitis 05/10/2008   Routine general medical examination at a health care facility    Spinal stenosis, unspecified region other than cervical 04/29/1995   Type II or unspecified type diabetes mellitus without mention of complication, uncontrolled    Past Surgical History:  Procedure Laterality Date   AIR/FLUID EXCHANGE Left 01/09/2016   Procedure: AIR/FLUID EXCHANGE;  Surgeon: Onesimo Blanch, MD;  Location: [facility] OR;  Service: Ophthalmology;  Laterality: Left;   COLONOSCOPY N/A 09/07/2024   Procedure: COLONOSCOPY;  Surgeon: Legrand Victory LITTIE DOUGLAS, MD;  Location: [facility] ENDOSCOPY;  Service: Gastroenterology;  Laterality: N/A;   CYSTOSCOPY W/ URETERAL STENT PLACEMENT  12/31/14   CYSTOSCOPY W/ URETERAL STENT PLACEMENT  05/16/2015   EYE SURGERY  2006   cataract, Dr Loras   LASER PHOTO ABLATION Left 01/09/2016   Procedure: LASER PHOTO ABLATION;  Surgeon: Onesimo Blanch, MD;  Location: [facility] OR;  Service: Ophthalmology;  Laterality: Left;  Endolaser   Left ureteral stent placement  Week of 12/05/11   PARS PLANA VITRECTOMY Left 01/09/2016   Procedure: PARS PLANA VITRECTOMY WITH 25 GAUGE LEFT EYE ;  Surgeon: Onesimo Blanch, MD;  Location: [facility] OR;  Service: Ophthalmology;  Laterality: Left;   PERFLUORONE INJECTION Left 01/09/2016   Procedure: PERFLUORONE INJECTION;  Surgeon: Onesimo Blanch, MD;  Location: [facility] OR;  Service: Ophthalmology;  Laterality: Left;   PROSTATECTOMY  1996   [name]   URETERAL STENT PLACEMENT  05/2020   [facility] every 3 months    URETERAL STENT PLACEMENT  09/23/2020   Patient gets replaced every 3 months    Social History:   reports that he quit smoking about 55 years ago. His smoking use included cigars. He has  never used smokeless tobacco. He reports that he does not drink alcohol and does not use drugs.  Family History  Problem Relation Age of Onset   Heart disease Father    Stroke Sister    Cancer Nephew        unknown type cancer    Medications: Patient's Medications  New Prescriptions   No medications on file  Previous Medications   ACETAMINOPHEN  (TYLENOL ) 500 MG TABLET    Take 2 tablets (1,000 mg total) by mouth every 6 (six) hours as needed for mild pain (pain score 1-3).   ALLOPURINOL  (ZYLOPRIM ) 100 MG TABLET    TAKE 2 TABLETS BY MOUTH EVERY DAY   CALCIUM  CARB-CHOLECALCIFEROL  600-5 MG-MCG TABS    Take 2 tablets by mouth daily.   CHOLECALCIFEROL  1000 UNITS TABLET    Take 1,000 Units by mouth daily.   DAROLUTAMIDE  (NUBEQA ) 300 MG TABLET    Take 300 mg by mouth in the morning and at bedtime.   DOXAZOSIN  (CARDURA ) 4 MG TABLET    TAKE 1 TABLET BY MOUTH EVERY DAY   GLUCOSE BLOOD (ONETOUCH VERIO) TEST STRIP    Check blood sugar one to two times a week. Dx:E11.29  LEUPROLIDE , 6 MONTH, (ELIGARD ) 45 MG INJECTION    Inject 45 mg into the skin every 6 (six) months. Filled by [facility] FATTY ACIDS (FISH OIL ) 1000 MG CAPS    Take 1 capsule by mouth daily.   PYRIDOXINE (B-6) 100 MG TABLET    Take 100 mg by mouth daily.   ROSUVASTATIN  (CRESTOR ) 5 MG TABLET    Take 5 mg by mouth daily.  Modified Medications   No medications on file  Discontinued Medications   No medications on file    Physical Exam:  Vitals:   10/26/24 0823  BP: 124/78  Pulse: 87  Resp: 18  SpO2: 96%  Height: 5' 11 (1.803 m)   Body mass index is 36.96 kg/m. Wt Readings from Last 3 Encounters:  10/04/24 265 lb (120.2 kg)  09/17/24 261 lb 8 oz (118.6 kg)  09/09/24 264 lb 1.8 oz (119.8 kg)    Physical Exam Constitutional:      General: He is not in acute distress.    Appearance: He is well-developed. He is not diaphoretic.  HENT:     Head: Normocephalic and atraumatic.     Right Ear: External ear  normal.     Left Ear: External ear normal.     Mouth/Throat:     Pharynx: No oropharyngeal exudate.  Eyes:     Conjunctiva/sclera: Conjunctivae normal.     Pupils: Pupils are equal, round, and reactive to light.  Cardiovascular:     Rate and Rhythm: Normal rate and regular rhythm.     Heart sounds: Murmur heard.  Pulmonary:     Effort: Pulmonary effort is normal.     Breath sounds: Normal breath sounds.  Abdominal:     General: Bowel sounds are normal.     Palpations: Abdomen is soft.  Musculoskeletal:        General: No tenderness.     Cervical back: Normal range of motion and neck supple.     Right lower leg: Edema (1+) present.     Left lower leg: Edema (1+) present.  Skin:    General: Skin is warm and dry.  Neurological:     Mental Status: He is alert and oriented to person, place, and time.     Labs reviewed: Basic Metabolic Panel: Recent Labs    09/08/24 0646 09/09/24 0608 09/17/24 1535 10/17/24 0000 10/17/24 0844  NA 135 138 139 141  --   K 3.8 4.0 4.2 4.7  --   CL 105 110 110 109*  --   CO2 20* 17* 23 21  --   GLUCOSE 123* 114* 126*  --   --   BUN 39* 40* 29* 26*  --   CREATININE 1.83* 1.74* 1.48* 1.5*  --   CALCIUM  7.3* 7.4* 9.0  --  8.5*   Liver Function Tests: Recent Labs    09/06/24 0203 09/17/24 1535  AST 17 15  ALT 13 12  ALKPHOS 48 63  BILITOT 0.5 0.4  PROT 5.3* 6.1*  ALBUMIN 2.8* 3.6   No results for input(s): LIPASE, AMYLASE in the last 8760 hours. No results for input(s): AMMONIA in the last 8760 hours. CBC: Recent Labs    09/07/24 0435 09/08/24 0646 09/08/24 1303 09/09/24 0608 09/17/24 1535  WBC 7.7 7.2  --  7.1 8.3  NEUTROABS 5.4 4.8  --   --  5.8  HGB 8.2* 7.3* 8.7* 8.1* 9.1*  HCT 25.5* 22.6* 27.3* 25.7* 27.6*  MCV 89.5 89.3  --  88.6 86.3  PLT 142* 122*  --  128* 165   Lipid Panel: No results for input(s): CHOL, HDL, LDLCALC, TRIG, CHOLHDL, LDLDIRECT in the last 8760 hours. TSH: No results for  input(s): TSH in the last 8760 hours. A1C: Lab Results  Component Value Date   HGBA1C 5.6 04/23/2024     Assessment/Plan  Assessment & Plan Colonic malignancy  Followed by oncology Surgery would be curative however high risk for surgery due to severe aortic stenosis and cardiology has not cleared due to risk  Chronic gastrointestinal blood loss and iron deficiency anemia Rectal mass in the cecum causing chronic blood loss and anemia. High surgical risk due to severe aortic stenosis and comorbidities. Awaiting cardiologist's second opinion for surgical clearance.  - Ordered hemoglobin check to assess response to iron infusion. - Continue follow-up with oncologist. - Await cardiologist's second opinion for surgical clearance. - Consider palliative management if not a surgical candidate.  Severe aortic stenosis High surgical risk with low flow, low gradient, and ejection fraction of 60%. Not a candidate for valve replacement or general anesthesia. Symptoms include shortness of breath and fatigue, exacerbated by anemia and cardiac issues. Awaiting second opinion for alternative management options. - Await second opinion from [facility]ologist at [facility] on January 6th. - Monitor symptoms and manage palliatively if not a surgical candidate.  Stage 3b chronic kidney disease Monitoring required due to potential impact of medications and fluid status on renal function. - Continue monitoring renal function and fluid status.  Malignant neoplasm of prostate Prostate cancer under control with medication. No new symptoms. - Continue current management with urologist.  Ureteral obstruction with stent management Managed with stent exchanges. Last exchange two weeks ago. No current urinary symptoms. Recent stent lasted six months, longer than usual. - Continue regular stent exchanges as scheduled.  Secondary malignant neoplasm of bone and bone marrow (HCC) Continues to be followed by  oncology -no symptoms at this time  Congestive dilated cardiomyopathy (HCC) Euvolemic at this time, diuretics on hold due to risk of hypotension Monitor for fluid overload   DM type 2 with diabetic peripheral neuropathy (HCC) Diet controlled -Encouraged dietary compliance, routine foot care/monitoring and to keep up with diabetic eye exams through ophthalmology  - Hemoglobin A1c  8. Mixed hyperlipidemia Continues on crestor  5 mg daily  - Lipid panel - Comprehensive metabolic panel with GFR   Return in about 4 months (around 02/24/2025) for routine follow up.  Jadin Kagel K. Caro BODILY [facility] [phone]     [1]  Allergies Allergen Reactions   Abiraterone Nausea And Vomiting and Other (See Comments)    Other reaction(s): Increased Heart Rate (intolerance)

## 2024-10-27 LAB — HEMOGLOBIN A1C
Hgb A1c MFr Bld: 5.6 % (ref <5.7)
Mean Plasma Glucose: 114 mg/dL
eAG (mmol/L): 6.3 mmol/L

## 2024-10-27 LAB — CBC WITH DIFFERENTIAL/PLATELET
Absolute Lymphocytes: 1808 {cells}/uL (ref 850–3900)
Absolute Monocytes: 520 {cells}/uL (ref 200–950)
Basophils Absolute: 72 {cells}/uL (ref 0–200)
Basophils Relative: 0.9 %
Eosinophils Absolute: 128 {cells}/uL (ref 15–500)
Eosinophils Relative: 1.6 %
HCT: 28.4 % — ABNORMAL LOW (ref 39.4–51.1)
Hemoglobin: 8.5 g/dL — ABNORMAL LOW (ref 13.2–17.1)
MCH: 26.1 pg — ABNORMAL LOW (ref 27.0–33.0)
MCHC: 29.9 g/dL — ABNORMAL LOW (ref 31.6–35.4)
MCV: 87.1 fL (ref 81.4–101.7)
MPV: 10.1 fL (ref 7.5–12.5)
Monocytes Relative: 6.5 %
Neutro Abs: 5472 {cells}/uL (ref 1500–7800)
Neutrophils Relative %: 68.4 %
Platelets: 242 Thousand/uL (ref 140–400)
RBC: 3.26 Million/uL — ABNORMAL LOW (ref 4.20–5.80)
RDW: 15.9 % — ABNORMAL HIGH (ref 11.0–15.0)
Total Lymphocyte: 22.6 %
WBC: 8 Thousand/uL (ref 3.8–10.8)

## 2024-10-27 LAB — LIPID PANEL
Cholesterol: 90 mg/dL (ref <200)
HDL: 52 mg/dL (ref >=40)
LDL Cholesterol (Calc): 24 mg/dL
Non-HDL Cholesterol (Calc): 38 mg/dL (ref <130)
Total CHOL/HDL Ratio: 1.7 (calc) (ref <5.0)
Triglycerides: 62 mg/dL (ref <150)

## 2024-10-27 LAB — COMPREHENSIVE METABOLIC PANEL WITH GFR
AG Ratio: 1.6 (calc) (ref 1.0–2.5)
ALT: 11 U/L (ref 9–46)
AST: 14 U/L (ref 10–35)
Albumin: 3.4 g/dL — ABNORMAL LOW (ref 3.6–5.1)
Alkaline phosphatase (APISO): 73 U/L (ref 35–144)
BUN/Creatinine Ratio: 14 (calc) (ref 6–22)
BUN: 20 mg/dL (ref 7–25)
CO2: 25 mmol/L (ref 20–32)
Calcium: 8.9 mg/dL (ref 8.6–10.3)
Chloride: 108 mmol/L (ref 98–110)
Creat: 1.45 mg/dL — ABNORMAL HIGH (ref 0.70–1.22)
Globulin: 2.1 g/dL (ref 1.9–3.7)
Glucose, Bld: 120 mg/dL — ABNORMAL HIGH (ref 65–99)
Potassium: 4.1 mmol/L (ref 3.5–5.3)
Sodium: 140 mmol/L (ref 135–146)
Total Bilirubin: 0.4 mg/dL (ref 0.2–1.2)
Total Protein: 5.5 g/dL — ABNORMAL LOW (ref 6.1–8.1)
eGFR: 46 mL/min/1.73m2 — ABNORMAL LOW (ref >=60)

## 2024-10-29 ENCOUNTER — Ambulatory Visit: Payer: Self-pay | Admitting: Nurse Practitioner

## 2024-11-02 ENCOUNTER — Encounter: Payer: Medicare Other | Admitting: Nurse Practitioner

## 2024-11-02 ENCOUNTER — Encounter: Payer: Self-pay | Admitting: Nurse Practitioner

## 2024-11-02 ENCOUNTER — Ambulatory Visit: Payer: Medicare Other | Admitting: Nurse Practitioner

## 2024-11-02 VITALS — BP 128/70 | HR 82 | Temp 97.1°F | Ht 71.0 in | Wt 255.0 lb

## 2024-11-02 DIAGNOSIS — Z0000 Encounter for general adult medical examination without abnormal findings: Secondary | ICD-10-CM | POA: Diagnosis not present

## 2024-11-02 NOTE — Patient Instructions (Signed)
 Mr. Frayne,  Thank you for taking the time for your Medicare Wellness Visit. I appreciate your continued commitment to your health goals. Please review the care plan we discussed, and feel free to reach out if I can assist you further.  Please note that Annual Wellness Visits do not include a physical exam. Some assessments may be limited, especially if the visit was conducted virtually. If needed, we may recommend an in-person follow-up with your provider.  Ongoing Care Seeing your primary care provider every 3 to 6 months helps us  monitor your health and provide consistent, personalized care.   Referrals If a referral was made during today's visit and you haven't received any updates within two weeks, please contact the referred provider directly to check on the status.  Recommended Screenings:  Health Maintenance  Topic Date Due   Medicare Annual Wellness Visit  10/30/2024   Complete foot exam   10/30/2024   COVID-19 Vaccine (3 - Pfizer risk series) 11/18/2024*   Zoster (Shingles) Vaccine (1 of 2) 01/31/2025*   DTaP/Tdap/Td vaccine (2 - Td or Tdap) 04/23/2025*   Eye exam for diabetics  12/19/2024   Hemoglobin A1C  04/26/2025   Colon Cancer Screening  09/07/2025   Pneumococcal Vaccine for age over 50  Completed   Flu Shot  Completed   Meningitis B Vaccine  Aged Out  *Topic was postponed. The date shown is not the original due date.       11/02/2024    9:34 AM  Advanced Directives  Does Patient Have a Medical Advance Directive? No    Vision: Annual vision screenings are recommended for early detection of glaucoma, cataracts, and diabetic retinopathy. These exams can also reveal signs of chronic conditions such as diabetes and high blood pressure.  Dental: Annual dental screenings help detect early signs of oral cancer, gum disease, and other conditions linked to overall health, including heart disease and diabetes.  Please see the attached documents for additional  preventive care recommendations.

## 2024-11-02 NOTE — Progress Notes (Signed)
 "  Chief Complaint  Patient presents with   Medicare Wellness    Annual wellness visit. Foot exam today.  NC[facility] verified. Discuss the need for COVID booster and shingles vaccine (pharmacy). Patinet declines vaccines.      Subjective:   Jeremiah Collins is a 89 y.o. male who presents for a Medicare Annual Wellness Visit.  Visit info / Clinical Intake: Medicare Wellness Visit Type:: Subsequent Annual Wellness Visit Persons participating in visit and providing information:: patient Medicare Wellness Visit Mode:: In-person (required for WTM) Interpreter Needed?: No Pre-visit prep was completed: yes AWV questionnaire completed by patient prior to visit?: no Living arrangements:: lives with spouse/significant other Patient's Overall Health Status Rating: (!) fair Typical amount of pain: some Does pain affect daily life?: no Are you currently prescribed opioids?: no  Dietary Habits and Nutritional Risks How many meals a day?: 3 Eats fruit and vegetables daily?: yes Most meals are obtained by: having others provide food; eating out In the last 2 weeks, have you had any of the following?: none Diabetic:: (!) yes Any non-healing wounds?: no How often do you check your BS?: 1; as needed Would you like to be referred to a Nutritionist or for Diabetic Management? : no  Functional Status Activities of Daily Living (to include ambulation/medication): Independent Ambulation: Independent Medication Administration: Independent Home Management (perform basic housework or laundry): Independent Manage your own finances?: yes Primary transportation is: driving Concerns about vision?: no *vision screening is required for WTM* Concerns about hearing?: no  Fall Screening Falls in the past year?: 0 Number of falls in past year: 0 Was there an injury with Fall?: 0 Fall Risk Category Calculator: 0 Patient Fall Risk Level: Low Fall Risk  Fall Risk Patient at Risk for Falls Due to: No Fall  Risks Fall risk Follow up: Falls evaluation completed  Home and Transportation Safety: All rugs have non-skid backing?: yes All stairs or steps have railings?: N/A, no stairs Grab bars in the bathtub or shower?: yes Have non-skid surface in bathtub or shower?: yes Good home lighting?: yes Regular seat belt use?: yes Hospital stays in the last year:: (!) yes How many hospital stays:: 1 Reason: Bleeding when having bowel movement  Cognitive Assessment Difficulty concentrating, remembering, or making decisions? : no Will 6CIT or Mini Cog be Completed: no 6CIT or Mini Cog Declined: patient declined  Advance Directives (For Healthcare) Does Patient Have a Medical Advance Directive?: No Would patient like information on creating a medical advance directive?: No - Patient declined  Reviewed/Updated  Reviewed/Updated: Reviewed All (Medical, Surgical, Family, Medications, Allergies, Care Teams, Patient Goals); Medical History; Surgical History; Medications; Allergies; Family History    Allergies (verified) Abiraterone   Current Medications (verified) Outpatient Encounter Medications as of 11/02/2024  Medication Sig   acetaminophen  (TYLENOL ) 500 MG tablet Take 2 tablets (1,000 mg total) by mouth every 6 (six) hours as needed for mild pain (pain score 1-3).   allopurinol  (ZYLOPRIM ) 100 MG tablet TAKE 2 TABLETS BY MOUTH EVERY DAY   Calcium  Carb-Cholecalciferol  600-5 MG-MCG TABS Take 2 tablets by mouth daily.   Cholecalciferol  1000 UNITS tablet Take 1,000 Units by mouth daily.   darolutamide  (NUBEQA ) 300 MG tablet Take 300 mg by mouth in the morning and at bedtime.   doxazosin  (CARDURA ) 4 MG tablet TAKE 1 TABLET BY MOUTH EVERY DAY   ferrous sulfate  325 (65 FE) MG tablet Take 325 mg by mouth daily with breakfast.   glucose blood (ONETOUCH VERIO) test strip Check blood  sugar one to two times a week. Dx:E11.29   leuprolide , 6 Month, (ELIGARD ) 45 MG injection Inject 45 mg into the skin every  6 (six) months. Filled by [facility]   Omega-3 Fatty Acids (FISH OIL ) 1000 MG CAPS Take 1 capsule by mouth daily.   pyridoxine (B-6) 100 MG tablet Take 100 mg by mouth daily.   rosuvastatin  (CRESTOR ) 5 MG tablet Take 5 mg by mouth daily.   No facility-administered encounter medications on file as of 11/02/2024.    History: 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/28/2006   Bone metastases    left mid tibial shaft   Cataract    Dr.Groat   Cellulitis and abscess of leg, except foot 01/17/2012   Closed fracture of five ribs 03/24/2004   First degree atrioventricular block 04/25/2007   History of radiation therapy 04/03/14- 04/17/14   mid to distal left tibia 3000 cGy in 10 sessions   HTN (hypertension)    Hx of radiation therapy 11/15/1998   prostate fossa - 6040 cGy, 33 fx, Dr Johney   Osteoarthrosis involving, or with mention of more than one site, but not specified as generalized, multiple sites 10/22/2010   Other abnormal blood chemistry 04/29/1991   Other and unspecified hyperlipidemia 01/02/2013   Prostate cancer (HCC) 12/16/1994   gleason 7   Reflux esophagitis 05/10/2008   Routine general medical examination at a health care facility    Spinal stenosis, unspecified region other than cervical 04/29/1995   Type II or unspecified type diabetes mellitus without mention of complication, uncontrolled    Past Surgical History:  Procedure Laterality Date   AIR/FLUID EXCHANGE Left 01/09/2016   Procedure: AIR/FLUID EXCHANGE;  Surgeon: Onesimo Blanch, MD;  Location: [facility] OR;  Service: Ophthalmology;  Laterality: Left;   COLONOSCOPY N/A 09/07/2024   Procedure: COLONOSCOPY;  Surgeon: Legrand Victory LITTIE DOUGLAS, MD;  Location: [facility] ENDOSCOPY;  Service: Gastroenterology;  Laterality: N/A;   CYSTOSCOPY W/ URETERAL STENT PLACEMENT  12/31/14   CYSTOSCOPY W/ URETERAL STENT PLACEMENT  05/16/2015   EYE SURGERY  2006   cataract, Dr Loras   LASER PHOTO  ABLATION Left 01/09/2016   Procedure: LASER PHOTO ABLATION;  Surgeon: Onesimo Blanch, MD;  Location: [facility] OR;  Service: Ophthalmology;  Laterality: Left;  Endolaser   Left ureteral stent placement  Week of 12/05/11   PARS PLANA VITRECTOMY Left 01/09/2016   Procedure: PARS PLANA VITRECTOMY WITH 25 GAUGE LEFT EYE ;  Surgeon: Onesimo Blanch, MD;  [facility] OR;  Service: Ophthalmology;  Laterality: Left;   PERFLUORONE INJECTION Left 01/09/2016   Procedure: PERFLUORONE INJECTION;  Surgeon: Onesimo Blanch, MD;  Location: [facility] OR;  Service: Ophthalmology;  Laterality: Left;   PROSTATECTOMY  1996   [name]   URETERAL STENT PLACEMENT  05/2020   [facility] every 3 months    URETERAL STENT PLACEMENT  09/23/2020   Patient gets replaced every 3 months    Family History  Problem Relation Age of Onset   Heart disease Father    Stroke Sister    Cancer Nephew        unknown type cancer   Social History   Occupational History   Not on file  Tobacco Use   Smoking status: Former    Types: Cigars    Quit date: 12/16/1968    Years since quitting: 55.9   Smokeless tobacco: Never  Vaping Use   Vaping status: Never Used  Substance and Sexual Activity  Alcohol use: No    Alcohol/week: 0.0 standard drinks of alcohol   Drug use: No   Sexual activity: Never   Tobacco Counseling Counseling given: Not Answered  SDOH Screenings   Food Insecurity: No Food Insecurity (09/17/2024)  Housing: Unknown (10/02/2024)   Received [facility]  Transportation Needs: No Transportation Needs (09/17/2024)  Utilities: Not At Risk (09/17/2024)  Depression (PHQ2-9): Low Risk (11/02/2024)  Social Connections: Moderately Integrated (09/06/2024)  Tobacco Use: Medium Risk (11/02/2024)   See flowsheets for full screening details  Depression Screen PHQ 2 & 9 Depression Scale- Over the past 2 weeks, how often have you been bothered by any of the following problems? Little interest or pleasure in  doing things: 0 Feeling down, depressed, or hopeless (PHQ Adolescent also includes...irritable): 0 PHQ-2 Total Score: 0     Goals Addressed   None          Objective:    Today's Vitals   11/02/24 0926  BP: 128/70  Pulse: 82  Temp: (!) 97.1 F (36.2 C)  SpO2: 98%  Weight: 255 lb (115.7 kg)  Height: 5' 11 (1.803 m)   Body mass index is 35.57 kg/m.  Hearing/Vision screen Hearing Screening - Comments:: Patient without hearing issues. Reports just &quot;old age&quot; but no concerns.  Vision Screening - Comments:: Last eye exam less than 12 months with Dr. Robinson. Patient denies any vision issues at this time.  Immunizations and Health Maintenance Health Maintenance  Topic Date Due   Medicare Annual Wellness (AWV)  10/30/2024   FOOT EXAM  10/30/2024   COVID-19 Vaccine (3 - Pfizer risk series) 11/18/2024 (Originally 01/20/2020)   Zoster Vaccines- Shingrix (1 of 2) 01/31/2025 (Originally 09/16/1953)   DTaP/Tdap/Td (2 - Td or Tdap) 04/23/2025 (Originally 11/08/2023)   OPHTHALMOLOGY EXAM  12/19/2024   HEMOGLOBIN A1C  04/26/2025   Colonoscopy  09/07/2025   Pneumococcal Vaccine: 50+ Years  Completed   Influenza Vaccine  Completed   Meningococcal B Vaccine  Aged Out        Assessment/Plan:  This is a routine wellness examination for Vong.  Patient Care Team: [name], NP as PCP - General (Geriatric Medicine) Jeffrie Oneil BROCKS, MD as PCP - Cardiology (Cardiology) Nicholaus Tanda LITTIE DOUGLAS, MD as Attending Physician (Urology) Robinson Idol, MD as Consulting Physician (Ophthalmology) Ardis, Evalene LITTIE, RN as Oncology Nurse Navigator  I have personally reviewed and noted the following in the patients chart:   Medical and social history Use of alcohol, tobacco or illicit drugs  Current medications and supplements including opioid prescriptions. Functional ability and status Nutritional status Physical activity Advanced directives List of other  physicians Hospitalizations, surgeries, and ER visits in previous 12 months Vitals Screenings to include cognitive, depression, and falls Referrals and appointments  No orders of the defined types were placed in this encounter.  In addition, I have reviewed and discussed with patient certain preventive protocols, quality metrics, and best practice recommendations. A written personalized care plan for preventive services as well as general preventive health recommendations were provided to patient.   Harlene POUR Caro, NP   11/02/2024    After Visit Summary: (In Person-Printed) AVS printed and given to the patient  "

## 2024-12-10 ENCOUNTER — Inpatient Hospital Stay: Admitting: Nurse Practitioner

## 2024-12-13 ENCOUNTER — Ambulatory Visit: Admitting: Orthopedic Surgery

## 2024-12-13 ENCOUNTER — Encounter: Payer: Self-pay | Admitting: Orthopedic Surgery

## 2024-12-13 VITALS — BP 140/72 | HR 80 | Temp 97.5°F | Ht 71.0 in | Wt 240.0 lb

## 2024-12-13 DIAGNOSIS — I4891 Unspecified atrial fibrillation: Secondary | ICD-10-CM | POA: Diagnosis not present

## 2024-12-13 DIAGNOSIS — N1832 Chronic kidney disease, stage 3b: Secondary | ICD-10-CM | POA: Diagnosis not present

## 2024-12-13 DIAGNOSIS — D500 Iron deficiency anemia secondary to blood loss (chronic): Secondary | ICD-10-CM | POA: Diagnosis not present

## 2024-12-13 DIAGNOSIS — T148XXA Other injury of unspecified body region, initial encounter: Secondary | ICD-10-CM | POA: Diagnosis not present

## 2024-12-13 DIAGNOSIS — I10 Essential (primary) hypertension: Secondary | ICD-10-CM | POA: Diagnosis not present

## 2024-12-13 DIAGNOSIS — I350 Nonrheumatic aortic (valve) stenosis: Secondary | ICD-10-CM | POA: Diagnosis not present

## 2024-12-13 NOTE — Patient Instructions (Addendum)
 Please make sure you have a cardiology follow up appointment   Ask cardiology about restarting metoprolol , lisinopril  and doxazosin    Continue Eliquis 2.5 mg> blood thinner> prevents blood clots

## 2024-12-13 NOTE — Progress Notes (Signed)
 "   Careteam: Patient Care Team: Jeremiah Harlene POUR, NP as PCP - General (Geriatric Medicine) Jeffrie Oneil BROCKS, MD as PCP - Cardiology (Cardiology) Nicholaus Tanda LITTIE DOUGLAS, MD as Attending Physician (Urology) Robinson Idol, MD as Consulting Physician (Ophthalmology) Ardis, Evalene LITTIE, RN as Oncology Nurse Navigator  Seen by: Greig Cluster, AGNP-C  PLACE OF SERVICE:  [facility]  Advanced Directive information    Allergies[1]  Chief Complaint  Patient presents with   Hospitalization Follow-up     HPI: Patient is a 89 y.o. male seen today for f/u s/p hospitalization 01/14 -01/20 due to severe aortic stenosis and atrial fibrillation.   Discussed the use of AI scribe software for clinical note transcription with the patient, who gave verbal consent to proceed.  History of Present Illness   Jeremiah Collins is an 89 year old male with bilateral carotid stenosis, hypertension, type 2 diabetes, prostate cancer with bone metastases, and adenocarcinoma of the colon who presents for follow-up after heart catherization complicated by a hematoma, blood loss anemia and new-onset atrial fibrillation.  He underwent pre-TAVR evaluation which was complicated by a hematoma in the right groin area, described as a 'big blood blister area' resembling a bruise. This led to blood loss anemia, necessitating a blood transfusion. His discharge hemoglobin was 9.9 g/dL.  During his hospital stay, he developed atrial fibrillation and was started on Eliquis 2.5 mg for anticoagulation. No chest pain, calf pain, or signs of bleeding. He reports shortness of breath, which he states is normal for him since it was found.  Metoprolol , lisinopril , doxazosin  held due to low blood pressure.   Today, he is unable to report what medications he is taking. He did not bring medications with him. He reports feeling very overwhelmed with appointments and medications. He is upset [facility] cardiology provider could not offer any solutions  except hospice for his severe stenosis. He is considering finding provider with [facility]. He is eating 3 meals daily. Denies any recent falls. Right groin hematoma resolved. He was able to drive himself to appointment today. He reports wife helps him with scheduling appointments not medication. He is followed by hospital at home with [facility] per chart review> last seen 01/27. Recent lab work showed improved hgb of 11.2.        Review of Systems:  Review of Systems  Constitutional: Negative.   HENT: Negative.    Respiratory:  Positive for sputum production. Negative for cough, shortness of breath and wheezing.   Cardiovascular:  Negative for chest pain and leg swelling.  Gastrointestinal:  Negative for abdominal pain.  Genitourinary:  Positive for frequency. Negative for dysuria and hematuria.  Musculoskeletal:  Negative for falls and joint pain.  Skin: Negative.   Neurological:  Positive for weakness. Negative for dizziness and headaches.  Psychiatric/Behavioral:  Positive for memory loss. Negative for depression. The patient is not nervous/anxious.    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/28/2006   Bone metastases    left mid tibial shaft   Cataract    Dr.Groat   Cellulitis and abscess of leg, except foot 01/17/2012   Closed fracture of five ribs 03/24/2004   First degree atrioventricular block 04/25/2007   History of radiation therapy 04/03/14- 04/17/14   mid to distal left tibia 3000 cGy in 10 sessions   HTN (hypertension)    Hx of radiation therapy 11/15/1998   prostate fossa - 6040 cGy, 33 fx, Dr Johney  Osteoarthrosis involving, or with mention of more than one site, but not specified as generalized, multiple sites 10/22/2010   Other abnormal blood chemistry 04/29/1991   Other and unspecified hyperlipidemia 01/02/2013   Prostate cancer (HCC) 12/16/1994   gleason 7   Reflux esophagitis 05/10/2008   Routine general  medical examination at a health care facility    Spinal stenosis, unspecified region other than cervical 04/29/1995   Type II or unspecified type diabetes mellitus without mention of complication, uncontrolled    Past Surgical History:  Procedure Laterality Date   AIR/FLUID EXCHANGE Left 01/09/2016   Procedure: AIR/FLUID EXCHANGE;  Surgeon: Onesimo Blanch, MD;  Location: [facility] OR;  Service: Ophthalmology;  Laterality: Left;   COLONOSCOPY N/A 09/07/2024   Procedure: COLONOSCOPY;  Surgeon: Legrand Victory LITTIE DOUGLAS, MD;  Location: [facility] ENDOSCOPY;  Service: Gastroenterology;  Laterality: N/A;   CYSTOSCOPY W/ URETERAL STENT PLACEMENT  12/31/2014   CYSTOSCOPY W/ URETERAL STENT PLACEMENT  05/16/2015   EYE SURGERY  2006   cataract, Dr Loras   heart stent  12/11/2024   LASER PHOTO ABLATION Left 01/09/2016   Procedure: LASER PHOTO ABLATION;  Surgeon: Onesimo Blanch, MD;  Location: [facility] OR;  Service: Ophthalmology;  Laterality: Left;  Endolaser   Left ureteral stent placement  Week of 12/05/11   PARS PLANA VITRECTOMY Left 01/09/2016   Procedure: PARS PLANA VITRECTOMY WITH 25 GAUGE LEFT EYE ;  Surgeon: Onesimo Blanch, MD;  Location: [facility] OR;  Service: Ophthalmology;  Laterality: Left;   PERFLUORONE INJECTION Left 01/09/2016   Procedure: PERFLUORONE INJECTION;  Surgeon: Onesimo Blanch, MD;  Location[facility] OR;  Service: Ophthalmology;  Laterality: Left;   PROSTATECTOMY  1996   [name]   URETERAL STENT PLACEMENT  05/2020   [facility] every 3 months    URETERAL STENT PLACEMENT  09/23/2020   Patient gets replaced every 3 months    Social History:   reports that he quit smoking about 56 years ago. His smoking use included cigars. He has never used smokeless tobacco. He reports that he does not drink alcohol and does not use drugs.  Family History  Problem Relation Age of Onset   Heart disease Father    Stroke Sister    Cancer Nephew        unknown type cancer    Medications: Patient's Medications  New  Prescriptions   No medications on file  Previous Medications   ACETAMINOPHEN  (TYLENOL ) 500 MG TABLET    Take 2 tablets (1,000 mg total) by mouth every 6 (six) hours as needed for mild pain (pain score 1-3).   ALLOPURINOL  (ZYLOPRIM ) 100 MG TABLET    TAKE 2 TABLETS BY MOUTH EVERY DAY   APIXABAN (ELIQUIS) 2.5 MG TABS TABLET    Take by mouth 2 (two) times daily.   CALCIUM  CARB-CHOLECALCIFEROL  600-5 MG-MCG TABS    Take 2 tablets by mouth daily.   CHOLECALCIFEROL  1000 UNITS TABLET    Take 1,000 Units by mouth daily.   DAROLUTAMIDE  (NUBEQA ) 300 MG TABLET    Take 300 mg by mouth in the morning and at bedtime.   DOXAZOSIN  (CARDURA ) 4 MG TABLET    TAKE 1 TABLET BY MOUTH EVERY DAY   FERROUS SULFATE  325 (65 FE) MG TABLET    Take 325 mg by mouth daily with breakfast.   GLUCOSE BLOOD (ONETOUCH VERIO) TEST STRIP    Check blood sugar one to two times a week. Dx:E11.29   LEUPROLIDE , 6 MONTH, (ELIGARD ) 45 MG INJECTION  Inject 45 mg into the skin every 6 (six) months. Filled by [facility] FATTY ACIDS (FISH OIL ) 1000 MG CAPS    Take 1 capsule by mouth daily.   PYRIDOXINE (B-6) 100 MG TABLET    Take 100 mg by mouth daily.   ROSUVASTATIN  (CRESTOR ) 5 MG TABLET    Take 5 mg by mouth daily.  Modified Medications   No medications on file  Discontinued Medications   No medications on file    Physical Exam:  Vitals:   12/13/24 0923 12/13/24 0926  BP: (!) 150/78 (!) 158/84  Pulse: 80   Temp: (!) 97.5 F (36.4 C)   SpO2: 98%   Weight: 240 lb (108.9 kg)   Height: 5' 11 (1.803 m)    Body mass index is 33.47 kg/m. Wt Readings from Last 3 Encounters:  12/13/24 240 lb (108.9 kg)  11/02/24 255 lb (115.7 kg)  10/26/24 255 lb 9.6 oz (115.9 kg)    Physical Exam Vitals reviewed.  Constitutional:      General: He is not in acute distress. HENT:     Head: Normocephalic.  Eyes:     General:        Right eye: No discharge.        Left eye: No discharge.  Neck:     Vascular: No carotid  bruit.  Cardiovascular:     Rate and Rhythm: Normal rate. Rhythm irregular.     Pulses: Normal pulses.     Heart sounds: No murmur heard. Pulmonary:     Effort: Pulmonary effort is normal. No respiratory distress.     Breath sounds: Normal breath sounds. No wheezing or rales.  Abdominal:     General: Bowel sounds are normal. There is no distension.     Palpations: Abdomen is soft.     Tenderness: There is no abdominal tenderness.  Musculoskeletal:     Cervical back: Neck supple.     Right lower leg: Edema present.     Left lower leg: Edema present.     Comments: BLE non pitting  Lymphadenopathy:     Cervical: No cervical adenopathy.  Skin:    General: Skin is warm.     Capillary Refill: Capillary refill takes less than 2 seconds.     Comments: Right groin absent of hematoma  Neurological:     General: No focal deficit present.     Mental Status: He is alert and oriented to person, place, and time.     Gait: Gait normal.  Psychiatric:        Mood and Affect: Mood normal.     Comments: Forgetful      Labs reviewed: Basic Metabolic Panel: Recent Labs    09/09/24 0608 09/17/24 1535 10/17/24 0000 10/17/24 0844 10/26/24 0854  NA 138 139 141  --  140  K 4.0 4.2 4.7  --  4.1  CL 110 110 109*  --  108  CO2 17* 23 21  --  25  GLUCOSE 114* 126*  --   --  120*  BUN 40* 29* 26*  --  20  CREATININE 1.74* 1.48* 1.5*  --  1.45*  CALCIUM  7.4* 9.0  --  8.5* 8.9   Liver Function Tests: Recent Labs    09/06/24 0203 09/17/24 1535 10/26/24 0854  AST 17 15 14   ALT 13 12 11   ALKPHOS 48 63  --   BILITOT 0.5 0.4 0.4  PROT 5.3* 6.1* 5.5*  ALBUMIN 2.8* 3.6  --  No results for input(s): LIPASE, AMYLASE in the last 8760 hours. No results for input(s): AMMONIA in the last 8760 hours. CBC: Recent Labs    09/08/24 0646 09/08/24 1303 09/09/24 0608 09/17/24 1535 10/26/24 0854  WBC 7.2  --  7.1 8.3 8.0  NEUTROABS 4.8  --   --  5.8 5,472  HGB 7.3*   < > 8.1* 9.1* 8.5*   HCT 22.6*   < > 25.7* 27.6* 28.4*  MCV 89.3  --  88.6 86.3 87.1  PLT 122*  --  128* 165 242   < > = values in this interval not displayed.   Lipid Panel: Recent Labs    10/26/24 0854  CHOL 90  HDL 52  LDLCALC 24  TRIG 62  CHOLHDL 1.7   TSH: No results for input(s): TSH in the last 8760 hours. A1C: Lab Results  Component Value Date   HGBA1C 5.6 10/26/2024     Assessment/Plan 1. Severe aortic stenosis (Primary) - followed by [facility] cardiology> Dr. Rojelio - [facility] cardiology recommended hospice over TAVR - 01/14 cardiac cath complicated by hematoma, blood loss anemia and atrial fib - no TAVR at this time - continues to have shortness of breath   2. Hematoma - resolved  3. Atrial fibrillation, unspecified type (HCC) - HR< 100 without medication - remains on Eliquis for clot prevention  - see above   4. Stage 3b chronic kidney disease (HCC) - BUN/creat 39/1.89 12/11/2024 - encourage hydration - avoid NSAIDS  5. Anemia due to chronic blood loss - associated with hematoma - 1 unit PRBC given  - hgb 11.2 12/07/2024  6. Essential (primary) hypertension - metoprolol , lisinopril  and doxazosin  held per notes - he is unable to state if he is taking medications or not  - will reach out to hospital at home team for clarification   Total time: 35 minutes. Greater than 50% of total time spent doing patient education regarding aortic valve stenosis, atrial fib, anemia, hematoma and medication review including symptom/medication management.      Next appt: Visit date not found  Danh Bayus Gil BODILY  [facility] [phone]      [1]  Allergies Allergen Reactions   Abiraterone Nausea And Vomiting and Other (See Comments)    Other reaction(s): Increased Heart Rate (intolerance)    "

## 2025-01-23 ENCOUNTER — Ambulatory Visit: Admitting: Podiatry

## 2025-02-25 ENCOUNTER — Ambulatory Visit: Admitting: Nurse Practitioner

## 2025-11-04 ENCOUNTER — Ambulatory Visit: Admitting: Nurse Practitioner
